# Patient Record
Sex: Male | Born: 1945
Health system: Southern US, Community
[De-identification: ages and names within clinical notes are randomized; demographics above are authoritative.]

## PROBLEM LIST (undated history)

## (undated) NOTE — *Deleted (*Deleted)
PMR Admission Coordinator Pre-Admission Assessment  Patient: Howard Allen is an 76 y.o., male MRN: [id number] DOB: 1945/09/12 Height: 5\' 10"  (177.8 cm) Weight: 61.1 kg              Insurance Information HMO:     PPO:     PCP:      IPA:      80/20: yes     OTHER:  PRIMARY: Medicare part A and B  Policy#:  [id number]     Subscriber: Pt.  CM Name:       Phone#:      Fax#:  Pre-Cert#: verified Health and safety inspector:  Benefits:  Phone #:      Name:  Eff. Date: 12/09/2002 A and B     Deduct: $1484      Out of Pocket Max: n/a      Life Max: n/a CIR: 100%      SNF: 20 full days Outpatient: 80%     Co-Pay: 20% Home Health: 100%      Co-Pay:  DME: 80%     Co-Pay: 20% Providers: pt choice       SECONDARY: Tricare for Life     Policy#: [id number]  The "Data Collection Information Summary" for patients in Inpatient Rehabilitation Facilities with attached "Privacy Act Statement-Health Care Records" was provided and verbally reviewed with: Patient and Family  Emergency Contact Information Contact Information    Name Relation Home Work [place] Daughter [phone]  [phone]   Albin, Duckett   [phone]     Current Medical History  Patient Admitting Diagnosis: CVA History of Present Illness: Howard Allen is a 70 y.o. right-handed male with history of Crohn's disease hypertension hyperlipidemia and depression.  Recent hospitalization for pneumatosis status post ileocolic resection.  Per chart review patient lives alone reportedly independent driving prior to admission.  1 level apartment.  Presented 02/13/2020 with tonic-clonic seizure and altered mental status.  Patient did receive Midazolam.  Seizures resolved and became apneic requiring intubation.  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  EEG negative for seizure.  Echocardiogram with ejection fraction of 55 to 60% no wall motion abnormalities grade 1  diastolic dysfunction.  Admission chemistries potassium 2.8, glucose 333, creatinine 1.91, WBC 17,300, CK 128, lactic acid 2.2, blood cultures no growth to date.  Neurology follow-up patient loaded with Keppra.  Plavix has been initiated for CVA prophylaxis.  Subcutaneous heparin for DVT prophylaxis.  An LP was performed on 02/14/2020 consistent with meningitis cultures no growth to date currently maintained on Rocephin and awaiting duration of antibiotic care and currently on droplet precautions.  Dysphagia #2 honey thick liquid diet.  Therapy evaluations completed with recommendations of physical medicine rehab consult. Complete NIHSS TOTAL: 14 Glasgow Coma Scale Score: 14  Past Medical History  No past medical history on file.  Family History  family history is not on file.  Prior Rehab/Hospitalizations:  Has the patient had prior rehab or hospitalizations prior to admission? Yes  Has the patient had major surgery during 100 days prior to admission? No  Current Medications   Current Facility-Administered Medications:  .  0.9 %  sodium chloride infusion, 250 mL, Intravenous, Continuous, Gleason, Darcella Gasman, PA-C, Stopped at 02/18/20 1022 .  acyclovir (ZOVIRAX) 690 mg in dextrose 5 % 100 mL IVPB, 10 mg/kg, Intravenous, Q12H, Verdene Lennert, MD, Last Rate: 113.8 mL/hr at 02/19/20 1343, 690 mg at  02/19/20 1343 .  albuterol (PROVENTIL) (2.5 MG/3ML) 0.083% nebulizer solution 2.5 mg, 2.5 mg, Nebulization, Q4H PRN, Verdene Lennert, MD .  Melene Muller ON 02/20/2020] amLODipine (NORVASC) tablet 5 mg, 5 mg, Oral, Daily, Lodema Hong A, RPH .  [START ON 02/20/2020] aspirin chewable tablet 81 mg, 81 mg, Oral, Daily, Lodema Hong A, RPH .  [START ON 02/20/2020] atorvastatin (LIPITOR) tablet 80 mg, 80 mg, Oral, Daily, Lodema Hong A, RPH .  cefTRIAXone (ROCEPHIN) 2 g in sodium chloride 0.9 % 100 mL IVPB, 2 g, Intravenous, Q12H, Verdene Lennert, MD, Last Rate: 200 mL/hr at 02/19/20 1235, 2 g at 02/19/20 1235  .  Chlorhexidine Gluconate Cloth 2 % PADS 6 each, 6 each, Topical, Daily, Gleason, Darcella Gasman, PA-C, 6 each at 02/19/20 1227 .  [START ON 02/20/2020] clopidogrel (PLAVIX) tablet 75 mg, 75 mg, Oral, Daily, Lodema Hong A, RPH .  docusate (COLACE) 50 MG/5ML liquid 100 mg, 100 mg, Oral, BID PRN, Lodema Hong A, RPH .  docusate (COLACE) 50 MG/5ML liquid 100 mg, 100 mg, Oral, BID, Lodema Hong A, RPH .  fentaNYL (SUBLIMAZE) injection 25 mcg, 25 mcg, Intravenous, Q15 min PRN, Gleason, Darcella Gasman, PA-C .  fentaNYL (SUBLIMAZE) injection 25-100 mcg, 25-100 mcg, Intravenous, Q30 min PRN, Gleason, Darcella Gasman, PA-C .  heparin injection 5,000 Units, 5,000 Units, Subcutaneous, Q8H, Gleason, Darcella Gasman, PA-C, 5,000 Units at 02/19/20 1346 .  insulin aspart (novoLOG) injection 0-9 Units, 0-9 Units, Subcutaneous, Q4H, Gleason, Darcella Gasman, PA-C, 1 Units at 02/17/20 2047 .  labetalol (NORMODYNE) injection 5 mg, 5 mg, Intravenous, Q2H PRN, Verdene Lennert, MD .  levETIRAcetam (KEPPRA) IVPB 1000 mg/100 mL premix, 1,000 mg, Intravenous, Q12H, Caryl Pina, MD, Last Rate: 400 mL/hr at 02/19/20 0353, 1,000 mg at 02/19/20 0353 .  multivitamin with minerals tablet 1 tablet, 1 tablet, Oral, Daily, Briant Sites, DO, 1 tablet at 02/19/20 1157 .  pantoprazole (PROTONIX) injection 40 mg, 40 mg, Intravenous, Daily, Gleason, Darcella Gasman, PA-C, 40 mg at 02/19/20 1209 .  [START ON 02/20/2020] polyethylene glycol (MIRALAX / GLYCOLAX) packet 17 g, 17 g, Oral, Daily, Lodema Hong A, RPH .  potassium chloride SA (KLOR-CON) CR tablet 40 mEq, 40 mEq, Oral, Daily, Lodema Hong A, RPH, 40 mEq at 02/19/20 1505 .  vancomycin (VANCOREADY) IVPB 750 mg/150 mL, 750 mg, Intravenous, Q12H, Silvana Newness, [facility], [facility], [facility], Last Rate: 150 mL/hr at 02/19/20 0436, 750 mg at 02/19/20 0436 .  [START ON 02/20/2020] vitamin B-12 (CYANOCOBALAMIN) tablet 1,000 mcg, 1,000 mcg, Oral, Daily, Pierce, Dwayne A, RPH  Patients Current Diet:  Diet Order            DIET DYS 2  Room service appropriate? Yes with Assist; Fluid consistency: Honey Thick  Diet effective now                 Precautions / Restrictions Precautions Precautions: Fall, Other (comment) (seizures; posey wrist restraints, hand mitts) Precaution Comments: rectal tube, condom cath Restrictions Weight Bearing Restrictions: No   Has the patient had 2 or more falls or a fall with injury in the past year?Yes  Prior Activity Level Limited Community (1-2x/wk): Pt. went out with daughter for errands  Prior Functional Level Prior Function Level of Independence:  (mod I) Comments: living alone but struggling at home per son.   Self Care: Did the patient need help bathing, dressing, using the toilet or eating?  Independent  Indoor Mobility: Did the patient need assistance with walking from room to room (with or without device)?  Independent  Stairs: Did the patient need assistance with internal or external stairs (with or without device)? Independent  Functional Cognition: Did the patient need help planning regular tasks such as shopping or remembering to take medications? Needed some help  Home Assistive Devices / Equipment Home Equipment: Hospital bed, Grab bars - tub/shower  Prior Device Use: Indicate devices/aids used by the patient prior to current illness, exacerbation or injury? Walker  Current Functional Level Cognition  Overall Cognitive Status: Impaired/Different from baseline Current Attention Level: Sustained Orientation Level: Disoriented to time, Oriented to person, Disoriented to situation Following Commands: Follows one step commands inconsistently, Follows one step commands with increased time Safety/Judgement: Decreased awareness of safety, Decreased awareness of deficits General Comments: some agitation noted towards end of session. Wanting to get out of bed "I need to go." Able to redirect. States he needs to call his wife. Son reports wife died 3 years ago.      Extremity Assessment (includes Sensation/Coordination)  Upper Extremity Assessment: Generalized weakness, Difficult to assess due to impaired cognition  Lower Extremity Assessment: Defer to PT evaluation RLE Deficits / Details: MMT scores of 3+ to 4- grossly RLE Sensation: WNL (denies N/T) RLE Coordination: decreased fine motor, decreased gross motor LLE Deficits / Details: MMT scores of 3+ to 4- grossly LLE Sensation: WNL (denies N/T) LLE Coordination: decreased fine motor, decreased gross motor    ADLs  Overall ADL's : Needs assistance/impaired Eating/Feeding: Moderate assistance, Sitting Eating/Feeding Details (indicate cue type and reason): 2/2 cognition Grooming: Maximal assistance Grooming Details (indicate cue type and reason): placed comb to head but difficulty sequencing movements to comb Upper Body Bathing: Maximal assistance, Sitting Lower Body Bathing: Maximal assistance, Sit to/from stand Upper Body Dressing : Maximal assistance, Sitting Lower Body Dressing: Maximal assistance, Sit to/from stand General ADL Comments: Sat EOB a few mintues then stood with mod A. Assist 2/2 generalized weakness, decreased activity tolerance, and impaired cognition    Mobility  Overal bed mobility: Needs Assistance Bed Mobility: Supine to Sit, Sit to Supine Supine to sit: Mod assist Sit to supine: Min guard General bed mobility comments: assist to powerup trunk and pivot hips to full EOB position. Sequencing cues.     Transfers  Overall transfer level: Needs assistance Equipment used: Rolling walker (2 wheeled) Transfers: Sit to/from Stand Sit to Stand: Mod assist General transfer comment: cues for safety. rw utilized. mod A to powerup steady adn control descent    Ambulation / Gait / Stairs / Wheelchair Mobility  Ambulation/Gait Ambulation/Gait assistance: Mod assist Gait Distance (Feet): 20 Feet Assistive device: Rolling walker (2 wheeled) Gait Pattern/deviations: Decreased step  length - right, Decreased step length - left, Decreased stride length, Shuffle, Scissoring, Staggering left, Staggering right, Narrow base of support General Gait Details: Ambulated around end of bed from L side to R of bed with RW at decreased speed. Pt exhibited narrow BOS and dec B step length with some scissoring, resulting in him stepping on his own feet with each step despite cues to correct. Pt unsteady and loses balance L and R, modA for safety and balance., Gait velocity: decreased Gait velocity interpretation: <1.31 ft/sec, indicative of household ambulator    Posture / Balance Dynamic Sitting Balance Sitting balance - Comments: Pt sits statically EOB majority of time with B UE support and no LOB, but occasionally leans posteriorly and requires modA to recover. Balance Overall balance assessment: History of Falls, Needs assistance Sitting-balance support: Bilateral upper extremity supported, Feet supported Sitting balance-Leahy Scale:  Poor Sitting balance - Comments: Pt sits statically EOB majority of time with B UE support and no LOB, but occasionally leans posteriorly and requires modA to recover. Postural control: Posterior lean Standing balance support: Bilateral upper extremity supported, During functional activity Standing balance-Leahy Scale: Poor Standing balance comment: Unsteadiness noted at knees and mod trunk sway, requlting in him requiring modA to maintain balance with B UE support on RW.    Special needs/care consideration Skin ***, Diabetic management *** and Special service needs ***     Previous Home Environment (from acute therapy documentation) Living Arrangements: Alone Available Help at Discharge: Family, Available PRN/intermittently Type of Home: Apartment Home Layout: One level Home Access: Level entry Bathroom Shower/Tub: Engineer, manufacturing systems: Standard Bathroom Accessibility: Yes Additional Comments: Son present and able to provide PLOF  data. Son reports pt was living alone but struggling with ADLs, family stopping by to check on him "all the time." Son reports plans were being made to move pt into ALF when he took a turn for the worse.  Discharge Living Setting Plans for Discharge Living Setting: Patient's home Type of Home at Discharge: House Discharge Home Layout: One level Discharge Home Access: Level entry Discharge Bathroom Shower/Tub: Walk-in shower Discharge Bathroom Toilet: Standard Discharge Bathroom Accessibility: Yes How Accessible: Accessible via walker Does the patient have any problems obtaining your medications?: Yes (Describe)  Social/Family/Support Systems Patient Roles: Other (Comment) Contact Information: [phone] Anticipated Caregiver: Lianne Moris Anticipated Caregiver's Contact Information: [phone] Ability/Limitations of Caregiver: none Caregiver Availability: Intermittent Discharge Plan Discussed with Primary Caregiver: Yes Is Caregiver In Agreement with Plan?: Yes   Goals Patient/Family Goal for Rehab: PT/OT/SLP Min A Expected length of stay: 14-17 days Pt/Family Agrees to Admission and willing to participate: Yes Program Orientation Provided & Reviewed with Pt/Caregiver Including Roles  & Responsibilities: Yes   Decrease burden of Care through IP rehab admission: Specialzed equipment needs, Diet advancement, Decrease number of caregivers, Bowel and bladder program and Patient/family education   Possible need for SNF placement upon discharge:Not anticipated   Patient Condition: {PATIENT'S CONDITION:22832}  Preadmission Screen Completed By:  Jeronimo Greaves, CCC-SLP, 02/19/2020 4:17 PM ______________________________________________________________________   Discussed status with Dr. Marland Kitchenon***at *** and received approval for admission today.  Admission Coordinator:  Jeronimo Greaves, time***/Date***

## (undated) NOTE — *Deleted (*Deleted)
Pharmacy Antibiotic Note  Howard Allen is a 46 y.o. male admitted on 02/13/2020 with seizures and now with concern for meningitis. Pharmacy has been consulted for Vancomycin dosing along with Acyclovir + Ampicillin + Rocephin per MD.   A Vancomycin trough this afternoon is ***therapeutic (VT ***, goal of 15-20 mcg/ml). The patient is noted to have resolving AKI (baseline unk) - SCr down to 1.18, CrCl~40-50 ml/min.   Plan: ***   Height: 5\' 10"  (177.8 cm) Weight: 59.5 kg (131 lb 2.8 oz) IBW/kg (Calculated) : 73  Temp (24hrs), Avg:98 F (36.7 C), Min:97.7 F (36.5 C), Max:98.3 F (36.8 C)  Recent Labs  Lab 02/13/20 1617 02/13/20 1617 02/13/20 1926 02/13/20 1930 02/13/20 1935 02/14/20 0418 02/15/20 0322 02/16/20 0729 02/17/20 0329  WBC 17.3*  --   --   --   --  9.0 6.2 9.4 7.6  CREATININE 1.91*   < >  --  1.71*  --  1.44* 1.19 1.23 1.18  LATICACIDVEN  --   --  1.9  --  2.2*  --   --   --   --    < > = values in this interval not displayed.    Estimated Creatinine Clearance: 47.6 mL/min (by C-G formula based on SCr of 1.18 mg/dL).    Allergies  Allergen Reactions  . Lisinopril Anaphylaxis, Cough and Other (See Comments)    Other reaction(s): anaphylaxis Pt states it made him "cough his head off"   . Lorazepam     Other reaction(s): Hallucinations    Antimicrobials this admission: Vanc 10/7 >> Ampicillin 10/7 >> Acyclovir 10/7 >> Rocephin 10/7 >>  Dose adjustments this admission: n/a  Microbiology results: 10/6 Fluvid >> neg 10/7 MRSA PCR >> positive 10/7 CSF cx >> [id number]  Thank you for allowing pharmacy to be a part of this patient's care.  Georgina Pillion, PharmD, BCPS Clinical Pharmacist Clinical phone for 02/17/2020: [phone] 02/17/2020 1:42 PM   **Pharmacist phone directory can now be found on [email or web address] ([email or web address]).  Listed under [facility].

## (undated) NOTE — *Deleted (*Deleted)
Physical Medicine and Rehabilitation Admission H&P    Chief Complaint  Patient presents with  . Seizures  : HPI: Howard Allen is a 70 year old right-handed male with history of Crohn's disease, hypertension, hyperlipidemia and depression.  Recent hospitalization for pneumatosis status post ileocolic resection.  Per chart review lives alone reportedly independent prior to admission and driving 1 level apartment.  Patient has a son in the area and a daughter in [place].  Presented 02/13/2020 with tonic-clonic seizure altered mental status.  Patient did receive Midazolam.  Seizure resolved became apneic requiring intubation.  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  EEG negative for seizure.  Echocardiogram with ejection fraction of 55 to 60% no wall motion abnormalities grade 1 diastolic dysfunction.  Admission chemistries potassium 2.8 glucose 333 creatinine 1.91 WBC 17,300 CK 128 lactic acid 2.2 blood cultures no growth to date.  Neurology follow-up loaded with Keppra.  Plavix and aspirin initiated for CVA prophylaxis.  Subcutaneous Lovenox for DVT prophylaxis.  An LP was performed on 02/14/2020 consistent with meningitis cultures no growth currently maintained on Rocephin/acyclovir as well as vancomycin awaiting duration of antibiotic care currently with droplet precautions.  PCR still pending.  Dysphagia #2 honey thick liquids.  Therapy evaluations completed patient was admitted for a comprehensive rehab program.  Review of Systems  Constitutional: Negative for chills and fever.  HENT: Negative for hearing loss.   Eyes: Negative for blurred vision and double vision.  Respiratory: Negative for cough and shortness of breath.   Cardiovascular: Negative for chest pain, palpitations and leg swelling.  Gastrointestinal: Positive for constipation and nausea. Negative for heartburn and vomiting.  Genitourinary: Positive for urgency.   Musculoskeletal: Positive for myalgias.  Skin: Negative for rash.  Neurological: Positive for seizures.  Psychiatric/Behavioral: Positive for depression. The patient has insomnia.   All other systems reviewed and are negative.  No past medical history on file. *** The histories are not reviewed yet. Please review them in the "History" navigator section and refresh this SmartLink. No family history on file. Social History:  has no history on file for tobacco use, alcohol use, and drug use. Allergies:  Allergies  Allergen Reactions  . Lisinopril Anaphylaxis, Cough and Other (See Comments)    Other reaction(s): anaphylaxis Pt states it made him "cough his head off"   . Lorazepam     Other reaction(s): Hallucinations   Medications Prior to Admission  Medication Sig Dispense Refill  . albuterol (VENTOLIN HFA) 108 (90 Base) MCG/ACT inhaler Inhale 2 puffs into the lungs every 6 (six) hours as needed for wheezing or shortness of breath.     Marland Kitchen amLODipine (NORVASC) 2.5 MG tablet Take 2.5 mg by mouth daily.    Marland Kitchen atorvastatin (LIPITOR) 20 MG tablet Take 20 mg by mouth daily.     . busPIRone (BUSPAR) 15 MG tablet Take 15 mg by mouth 2 (two) times daily.    . cyproheptadine (PERIACTIN) 4 MG tablet Take 4 mg by mouth 2 (two) times daily.    . fenofibrate 160 MG tablet Take 160 mg by mouth daily.    . metoprolol succinate (TOPROL-XL) 100 MG 24 hr tablet Take 100 mg by mouth daily.    . pantoprazole (PROTONIX) 20 MG tablet Take 20 mg by mouth daily.    . budesonide (ENTOCORT EC) 3 MG 24 hr capsule Take 3 mg by mouth daily.    Marland Kitchen doxazosin (CARDURA) 1 MG tablet Take 1 mg  by mouth daily.    . hydrochlorothiazide (HYDRODIURIL) 25 MG tablet Take 25 mg by mouth daily.    . meclizine (ANTIVERT) 25 MG tablet Take 25 mg by mouth 2 (two) times daily as needed for dizziness.       Drug Regimen Review Drug regimen was reviewed and remains appropriate with no significant issues identified.  Home: Home  Living Family/patient expects to be discharged to:: Private residence Living Arrangements: Alone Available Help at Discharge: Family, Available PRN/intermittently Type of Home: Apartment Home Access: Level entry Home Layout: One level Bathroom Shower/Tub: Engineer, manufacturing systems: Standard Bathroom Accessibility: Yes Home Equipment: Hospital bed, Grab bars - tub/shower Additional Comments: Son present and able to provide PLOF data. Son reports pt was living alone but struggling with ADLs, family stopping by to check on him "all the time." Son reports plans were being made to move pt into ALF when he took a turn for the worse.   Functional History: Prior Function Level of Independence:  (mod I) Comments: living alone but struggling at home per son.   Functional Status:  Mobility: Bed Mobility Overal bed mobility: Needs Assistance Bed Mobility: Supine to Sit, Sit to Supine Supine to sit: Mod assist Sit to supine: Min guard General bed mobility comments: assist to powerup trunk and pivot hips to full EOB position. Sequencing cues.  Transfers Overall transfer level: Needs assistance Equipment used: Rolling walker (2 wheeled) Transfers: Sit to/from Stand Sit to Stand: Mod assist General transfer comment: cues for safety. rw utilized. mod A to powerup steady adn control descent Ambulation/Gait Ambulation/Gait assistance: Mod assist Gait Distance (Feet): 20 Feet Assistive device: Rolling walker (2 wheeled) Gait Pattern/deviations: Decreased step length - right, Decreased step length - left, Decreased stride length, Shuffle, Scissoring, Staggering left, Staggering right, Narrow base of support General Gait Details: Ambulated around end of bed from L side to R of bed with RW at decreased speed. Pt exhibited narrow BOS and dec B step length with some scissoring, resulting in him stepping on his own feet with each step despite cues to correct. Pt unsteady and loses balance L and R,  modA for safety and balance., Gait velocity: decreased Gait velocity interpretation: <1.31 ft/sec, indicative of household ambulator    ADL: ADL Overall ADL's : Needs assistance/impaired Eating/Feeding: Moderate assistance, Sitting Eating/Feeding Details (indicate cue type and reason): 2/2 cognition Grooming: Maximal assistance Grooming Details (indicate cue type and reason): placed comb to head but difficulty sequencing movements to comb Upper Body Bathing: Maximal assistance, Sitting Lower Body Bathing: Maximal assistance, Sit to/from stand Upper Body Dressing : Maximal assistance, Sitting Lower Body Dressing: Maximal assistance, Sit to/from stand General ADL Comments: Sat EOB a few mintues then stood with mod A. Assist 2/2 generalized weakness, decreased activity tolerance, and impaired cognition  Cognition: Cognition Overall Cognitive Status: Impaired/Different from baseline Orientation Level: Oriented to person, Oriented to place, Disoriented to place Cognition Arousal/Alertness: Awake/alert Behavior During Therapy: Restless, Impulsive Overall Cognitive Status: Impaired/Different from baseline Area of Impairment: Orientation, Attention, Memory, Following commands, Safety/judgement, Awareness, Problem solving Orientation Level: Disoriented to, Place, Time, Situation Current Attention Level: Sustained Memory: Decreased short-term memory, Decreased recall of precautions Following Commands: Follows one step commands inconsistently, Follows one step commands with increased time Safety/Judgement: Decreased awareness of safety, Decreased awareness of deficits Awareness: Intellectual Problem Solving: Slow processing, Decreased initiation, Difficulty sequencing, Requires verbal cues, Requires tactile cues General Comments: some agitation noted towards end of session. Wanting to get out of bed "I need  to go." Able to redirect. States he needs to call his wife. Son reports wife died 3  years ago.   Physical Exam: Blood pressure (!) 160/94, pulse 80, temperature 98.3 F (36.8 C), temperature source Axillary, resp. rate 17, height 5\' 10"  (1.778 m), weight 60.8 kg, SpO2 100 %. Physical Exam Neurological:     Comments: Patient is lethargic but arousable.  Provides his name but was not able to give place or time.  Follows simple commands.  Limited medical historian.  Mood is flat but appropriate     Results for orders placed or performed during the hospital encounter of 02/13/20 (from the past 48 hour(s))  Glucose, capillary     Status: None   Collection Time: 02/18/20  8:16 AM  Result Value Ref Range   Glucose-Capillary 95 70 - 99 mg/dL    Comment: Glucose reference range applies only to samples taken after fasting for at least 8 hours.  Glucose, capillary     Status: Abnormal   Collection Time: 02/18/20 12:07 PM  Result Value Ref Range   Glucose-Capillary 117 (H) 70 - 99 mg/dL    Comment: Glucose reference range applies only to samples taken after fasting for at least 8 hours.  Glucose, capillary     Status: Abnormal   Collection Time: 02/18/20  4:29 PM  Result Value Ref Range   Glucose-Capillary 109 (H) 70 - 99 mg/dL    Comment: Glucose reference range applies only to samples taken after fasting for at least 8 hours.   Comment 1 Notify RN    Comment 2 Document in Chart   Glucose, capillary     Status: None   Collection Time: 02/18/20  8:23 PM  Result Value Ref Range   Glucose-Capillary 93 70 - 99 mg/dL    Comment: Glucose reference range applies only to samples taken after fasting for at least 8 hours.  Glucose, capillary     Status: None   Collection Time: 02/18/20 11:27 PM  Result Value Ref Range   Glucose-Capillary 82 70 - 99 mg/dL    Comment: Glucose reference range applies only to samples taken after fasting for at least 8 hours.  Glucose, capillary     Status: Abnormal   Collection Time: 02/19/20  3:55 AM  Result Value Ref Range   Glucose-Capillary 103  (H) 70 - 99 mg/dL    Comment: Glucose reference range applies only to samples taken after fasting for at least 8 hours.  Basic metabolic panel     Status: Abnormal   Collection Time: 02/19/20  6:41 AM  Result Value Ref Range   Sodium 135 135 - 145 mmol/L   Potassium 3.8 3.5 - 5.1 mmol/L   Chloride 96 (L) 98 - 111 mmol/L   CO2 24 22 - 32 mmol/L   Glucose, Bld 93 70 - 99 mg/dL    Comment: Glucose reference range applies only to samples taken after fasting for at least 8 hours.   BUN 18 8 - 23 mg/dL   Creatinine, Ser 1.61 (H) 0.61 - 1.24 mg/dL   Calcium 9.4 8.9 - 09.6 mg/dL   GFR, Estimated 56 (L) >60 mL/min   Anion gap 15 5 - 15    Comment: Performed at [facility], [address], [place], [place] [address]  Magnesium     Status: Abnormal   Collection Time: 02/19/20  6:41 AM  Result Value Ref Range   Magnesium 1.6 (L) 1.7 - 2.4 mg/dL  Comment: Performed at [facility], [address] [place], [place] [address]  Phosphorus     Status: Abnormal   Collection Time: 02/19/20  6:41 AM  Result Value Ref Range   Phosphorus 1.8 (L) 2.5 - 4.6 mg/dL    Comment: Performed at [facility], [address] [place], [place] [address]  Glucose, capillary     Status: None   Collection Time: 02/19/20  7:52 AM  Result Value Ref Range   Glucose-Capillary 81 70 - 99 mg/dL    Comment: Glucose reference range applies only to samples taken after fasting for at least 8 hours.  Glucose, capillary     Status: Abnormal   Collection Time: 02/19/20 11:38 AM  Result Value Ref Range   Glucose-Capillary 67 (L) 70 - 99 mg/dL    Comment: Glucose reference range applies only to samples taken after fasting for at least 8 hours.  Glucose, capillary     Status: Abnormal   Collection Time: 02/19/20 12:06 PM  Result Value Ref Range   Glucose-Capillary 61 (L) 70 - 99 mg/dL    Comment: Glucose reference range applies only to samples taken after fasting for at least 8 hours.  Glucose, capillary      Status: Abnormal   Collection Time: 02/19/20 12:30 PM  Result Value Ref Range   Glucose-Capillary 119 (H) 70 - 99 mg/dL    Comment: Glucose reference range applies only to samples taken after fasting for at least 8 hours.  Glucose, capillary     Status: None   Collection Time: 02/19/20  3:59 PM  Result Value Ref Range   Glucose-Capillary 95 70 - 99 mg/dL    Comment: Glucose reference range applies only to samples taken after fasting for at least 8 hours.  Vancomycin, trough     Status: Abnormal   Collection Time: 02/19/20  4:45 PM  Result Value Ref Range   Vancomycin Tr 26 (HH) 15 - 20 ug/mL    Comment: CRITICAL RESULT CALLED TO, READ BACK BY AND VERIFIED WITH: O.GRAY RN [id number] 02/19/20 MCCORMICK K Performed at [facility], [address], [place], [place] [address]   Creatinine, serum     Status: Abnormal   Collection Time: 02/19/20  6:13 PM  Result Value Ref Range   Creatinine, Ser 1.29 (H) 0.61 - 1.24 mg/dL   GFR, Estimated 55 (L) >60 mL/min    Comment: Performed at [facility], [address], [place], [place] [address]  Glucose, capillary     Status: Abnormal   Collection Time: 02/19/20  8:16 PM  Result Value Ref Range   Glucose-Capillary 122 (H) 70 - 99 mg/dL    Comment: Glucose reference range applies only to samples taken after fasting for at least 8 hours.  Glucose, capillary     Status: Abnormal   Collection Time: 02/19/20 11:35 PM  Result Value Ref Range   Glucose-Capillary 129 (H) 70 - 99 mg/dL    Comment: Glucose reference range applies only to samples taken after fasting for at least 8 hours.  Glucose, capillary     Status: Abnormal   Collection Time: 02/20/20  3:46 AM  Result Value Ref Range   Glucose-Capillary 121 (H) 70 - 99 mg/dL    Comment: Glucose reference range applies only to samples taken after fasting for at least 8 hours.  Magnesium     Status: None   Collection Time: 02/20/20  4:49 AM  Result Value Ref Range  Magnesium 2.0 1.7 - 2.4  mg/dL    Comment: Performed at [facility], [address] [place], [place] [address]  Phosphorus     Status: None   Collection Time: 02/20/20  4:49 AM  Result Value Ref Range   Phosphorus 3.0 2.5 - 4.6 mg/dL    Comment: Performed at [facility], [address] [place], [place] [address]  Vancomycin, random     Status: None   Collection Time: 02/20/20  4:49 AM  Result Value Ref Range   Vancomycin Rm 16     Comment:        Random Vancomycin therapeutic range is dependent on dosage and time of specimen collection. A peak range is 20.0-40.0 ug/mL A trough range is 5.0-15.0 ug/mL        Performed at [facility], [address], [place], [place] [address]   Basic metabolic panel     Status: Abnormal   Collection Time: 02/20/20  4:49 AM  Result Value Ref Range   Sodium 134 (L) 135 - 145 mmol/L   Potassium 3.4 (L) 3.5 - 5.1 mmol/L   Chloride 99 98 - 111 mmol/L   CO2 24 22 - 32 mmol/L   Glucose, Bld 97 70 - 99 mg/dL    Comment: Glucose reference range applies only to samples taken after fasting for at least 8 hours.   BUN 15 8 - 23 mg/dL   Creatinine, Ser 0.10 0.61 - 1.24 mg/dL   Calcium 8.8 (L) 8.9 - 10.3 mg/dL   GFR, Estimated >27 >25 mL/min   Anion gap 11 5 - 15    Comment: Performed at [facility], [address], [place], [place] [address]   CT ANGIO HEAD W OR WO CONTRAST  Result Date: 02/18/2020 CLINICAL DATA:  Recent seizures and meningitis with left thalamic and left hippocampal signal abnormality on MRI. EXAM: CT ANGIOGRAPHY HEAD AND NECK TECHNIQUE: Multidetector CT imaging of the head and neck was performed using the standard protocol during bolus administration of intravenous contrast. Multiplanar CT image reconstructions and MIPs were obtained to evaluate the vascular anatomy. Carotid stenosis measurements (when applicable) are obtained utilizing NASCET criteria, using the distal internal carotid diameter as the denominator. CONTRAST:  75mL  OMNIPAQUE IOHEXOL 350 MG/ML SOLN COMPARISON:  Head MRI 02/14/2020 FINDINGS: CT HEAD FINDINGS Brain: A 2.5 cm region of hypoattenuation at the ventral aspect of the left thalamus corresponds to cytotoxic edema on MRI. No new infarct, intracranial hemorrhage, midline shift, or extra-axial fluid collection is identified. Hypodensities in the cerebral white matter bilaterally are nonspecific but compatible with mild chronic small vessel ischemic disease. Vascular: No hyperdense vessel. Skull: No fracture or suspicious osseous lesion. Sinuses: Visualized paranasal sinuses and mastoid air cells are clear. Orbits: Bilateral cataract extraction. Review of the MIP images confirms the above findings CTA NECK FINDINGS Aortic arch: Normal variant aortic arch branching pattern with common origin of the brachiocephalic and left common carotid arteries. Mild atherosclerosis without arch vessel origin stenosis. Right carotid system: Patent with minimal scattered soft plaque in the common carotid and proximal internal carotid artery. No evidence of dissection or stenosis. Left carotid system: Patent with minimal calcified and soft plaque at the carotid bifurcation. No evidence of dissection or stenosis. Vertebral arteries: Patent without evidence of dissection or stenosis. Mildly dominant right vertebral artery. Skeleton: Moderate lower cervical disc degeneration. Asymmetric right facet arthrosis at C7-T1. Other neck: No evidence of cervical lymphadenopathy or mass. Upper  chest: Mild centrilobular emphysema and biapical pleuroparenchymal scarring. Calcified granulomas in the left upper lobe. Multiple small nodules in the right upper lobe measuring up to 5 mm. Review of the MIP images confirms the above findings CTA HEAD FINDINGS Anterior circulation: The internal carotid arteries are widely patent from skull base to carotid termini. 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 ACAs and MCAs are patent with moderate right and  mild left distal MCA and ACA branch vessel irregularity but no evidence of a proximal branch occlusion. There is no significant M1 or right A1 stenosis. There is mild irregular narrowing of the left A1 segment. Posterior circulation: The intracranial vertebral arteries are widely patent to the basilar. Patent right PICA, left AICA, and bilateral SCA origins are identified. The basilar artery is widely patent. Posterior communicating arteries are diminutive or absent. Both PCAs are patent without evidence of a significant proximal stenosis. No aneurysm is identified. Venous sinuses: Patent. Anatomic variants: None. Review of the MIP images confirms the above findings IMPRESSION: 1. 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 3. Widely patent cervical carotid and vertebral arteries. 4. Right upper lobe pulmonary nodules measuring up to 5 mm. Non-contrast chest CT can be considered in 12 months. This recommendation follows the consensus statement: Guidelines for Management of Incidental Pulmonary Nodules Detected on CT Images: From the Fleischner Society 2017; Radiology 2017; 284:228-243. 5. Aortic Atherosclerosis (ICD10-I70.0) and Emphysema (ICD10-J43.9). Electronically Signed   By: Sebastian Ache M.D.   On: 02/18/2020 11:53   CT ANGIO NECK W OR WO CONTRAST  Result Date: 02/18/2020 CLINICAL DATA:  Recent seizures and meningitis with left thalamic and left hippocampal signal abnormality on MRI. EXAM: CT ANGIOGRAPHY HEAD AND NECK TECHNIQUE: Multidetector CT imaging of the head and neck was performed using the standard protocol during bolus administration of intravenous contrast. Multiplanar CT image reconstructions and MIPs were obtained to evaluate the vascular anatomy. Carotid stenosis measurements (when applicable) are obtained utilizing NASCET criteria, using the distal internal carotid diameter as the denominator.  CONTRAST:  75mL OMNIPAQUE IOHEXOL 350 MG/ML SOLN COMPARISON:  Head MRI 02/14/2020 FINDINGS: CT HEAD FINDINGS Brain: A 2.5 cm region of hypoattenuation at the ventral aspect of the left thalamus corresponds to cytotoxic edema on MRI. No new infarct, intracranial hemorrhage, midline shift, or extra-axial fluid collection is identified. Hypodensities in the cerebral white matter bilaterally are nonspecific but compatible with mild chronic small vessel ischemic disease. Vascular: No hyperdense vessel. Skull: No fracture or suspicious osseous lesion. Sinuses: Visualized paranasal sinuses and mastoid air cells are clear. Orbits: Bilateral cataract extraction. Review of the MIP images confirms the above findings CTA NECK FINDINGS Aortic arch: Normal variant aortic arch branching pattern with common origin of the brachiocephalic and left common carotid arteries. Mild atherosclerosis without arch vessel origin stenosis. Right carotid system: Patent with minimal scattered soft plaque in the common carotid and proximal internal carotid artery. No evidence of dissection or stenosis. Left carotid system: Patent with minimal calcified and soft plaque at the carotid bifurcation. No evidence of dissection or stenosis. Vertebral arteries: Patent without evidence of dissection or stenosis. Mildly dominant right vertebral artery. Skeleton: Moderate lower cervical disc degeneration. Asymmetric right facet arthrosis at C7-T1. Other neck: No evidence of cervical lymphadenopathy or mass. Upper chest: Mild centrilobular emphysema and biapical pleuroparenchymal scarring. Calcified granulomas in the left upper lobe. Multiple small nodules in the right upper lobe measuring up to 5 mm. Review  of the MIP images confirms the above findings CTA HEAD FINDINGS Anterior circulation: The internal carotid arteries are widely patent from skull base to carotid termini. 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 ACAs and MCAs are patent with  moderate right and mild left distal MCA and ACA branch vessel irregularity but no evidence of a proximal branch occlusion. There is no significant M1 or right A1 stenosis. There is mild irregular narrowing of the left A1 segment. Posterior circulation: The intracranial vertebral arteries are widely patent to the basilar. Patent right PICA, left AICA, and bilateral SCA origins are identified. The basilar artery is widely patent. Posterior communicating arteries are diminutive or absent. Both PCAs are patent without evidence of a significant proximal stenosis. No aneurysm is identified. Venous sinuses: Patent. Anatomic variants: None. Review of the MIP images confirms the above findings IMPRESSION: 1. 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 3. Widely patent cervical carotid and vertebral arteries. 4. Right upper lobe pulmonary nodules measuring up to 5 mm. Non-contrast chest CT can be considered in 12 months. This recommendation follows the consensus statement: Guidelines for Management of Incidental Pulmonary Nodules Detected on CT Images: From the Fleischner Society 2017; Radiology 2017; 284:228-243. 5. Aortic Atherosclerosis (ICD10-I70.0) and Emphysema (ICD10-J43.9). Electronically Signed   By: Sebastian Ache M.D.   On: 02/18/2020 11:53   DG Swallowing Func-Speech Pathology  Result Date: 02/18/2020 Objective Swallowing Evaluation: Type of Study: MBS-Modified Barium Swallow Study  Patient Details Name: Laird Runnion MRN: [id number] Date of Birth: [birth year] Today's Date: 02/18/2020 Time: SLP Start Time (ACUTE ONLY): 1412 -SLP Stop Time (ACUTE ONLY): 1428 SLP Time Calculation (min) (ACUTE ONLY): 16 min Past Medical History: No past medical history on file. Past Surgical History: The histories are not reviewed yet. Please review them in the "History" navigator section and refresh this SmartLink. HPI: 58 y.o. 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   Subjective: pt alert, cooperative but confused Assessment / Plan / Recommendation CHL IP CLINICAL IMPRESSIONS 02/18/2020 Clinical Impression Pt has a mild oropharyngeal dysphagia suspected to be mostly secondary to recent intubation as well as altered mentation. He has mild anterior spillage with thin liquids and prolonged mastication with solids. When boluses reach his pharynx he has mildly reduced base of tongue retraction and hyolaryngeal elevation/excursion. Minimal residuals remain in his pharynx post-swallow, and he is often able to achieve sufficient laryngeal vestibule closure to protect his airway. However, he also has fluctuating timing, and when boluses reach his pyriform sinuses before the swallow and start to spill posteriorly into the laryngeal vestibule, he does not have the ability to squeeze them back out. Suspect reduced glottic sufficiency as thin and nectar thickl barium continues to progress onward past his vocal folds as he swallows. Aspiration is silent and cannot be fully cleared with a cued cough. Airway protection is much better with honey thick liquids and solids, which are better contained above his valleculae before the swallow. Recommend starting Dys 2 diet and honey thick liquids. Will f/u for potential to advance as mentation improves and with more time post-extubation, as length of intubation was pretty brief.  SLP Visit  Diagnosis Dysphagia, pharyngeal phase (R13.13) Attention and concentration deficit following -- Frontal lobe and executive function deficit following -- Impact on safety and function Moderate aspiration risk   [facility]L  IP TREATMENT RECOMMENDATION 02/18/2020 Treatment Recommendations Therapy as outlined in treatment plan below   Prognosis 02/18/2020 Prognosis for Safe Diet Advancement Good Barriers to Reach Goals Cognitive deficits Barriers/Prognosis Comment -- [facility] IP DIET RECOMMENDATION 02/18/2020 SLP Diet Recommendations Dysphagia 2 (Fine chop) solids;Honey thick liquids Liquid Administration via Cup;Straw Medication Administration Crushed with puree Compensations Minimize environmental distractions;Slow rate;Small sips/bites Postural Changes Seated upright at 90 degrees   CHL IP OTHER RECOMMENDATIONS 02/18/2020 Recommended Consults -- Oral Care Recommendations Oral care BID Other Recommendations --   [facility] IP FOLLOW UP RECOMMENDATIONS 02/18/2020 Follow up Recommendations (No Data)   [facility]L IP FREQUENCY AND DURATION 02/18/2020 Speech Therapy Frequency (ACUTE ONLY) min 2x/week Treatment Duration 2 weeks      CHL IP ORAL PHASE 02/18/2020 Oral Phase Impaired Oral - Pudding Teaspoon -- Oral - Pudding Cup -- Oral - Honey Teaspoon -- Oral - Honey Cup Reduced posterior propulsion Oral - Nectar Teaspoon -- Oral - Nectar Cup Reduced posterior propulsion Oral - Nectar Straw Reduced posterior propulsion Oral - Thin Teaspoon -- Oral - Thin Cup Left anterior bolus loss;Right anterior bolus loss;Reduced posterior propulsion Oral - Thin Straw Reduced posterior propulsion Oral - Puree Reduced posterior propulsion Oral - Mech Soft Reduced posterior propulsion;Delayed oral transit Oral - Regular -- Oral - Multi-Consistency -- Oral - Pill -- Oral Phase - Comment --  [facility]L IP PHARYNGEAL PHASE 02/18/2020 Pharyngeal Phase Impaired Pharyngeal- Pudding Teaspoon -- Pharyngeal -- Pharyngeal- Pudding Cup -- Pharyngeal -- Pharyngeal- Honey Teaspoon -- Pharyngeal -- Pharyngeal- Honey Cup Reduced anterior laryngeal mobility;Reduced laryngeal elevation;Reduced tongue base retraction;Pharyngeal residue - valleculae Pharyngeal -- Pharyngeal- Nectar Teaspoon --  Pharyngeal -- Pharyngeal- Nectar Cup Reduced anterior laryngeal mobility;Reduced laryngeal elevation;Reduced tongue base retraction;Pharyngeal residue - valleculae;Penetration/Aspiration before swallow Pharyngeal Material enters airway, passes BELOW cords without attempt by patient to eject out (silent aspiration) Pharyngeal- Nectar Straw Reduced anterior laryngeal mobility;Reduced laryngeal elevation;Reduced tongue base retraction;Pharyngeal residue - valleculae;Penetration/Aspiration before swallow Pharyngeal Material enters airway, passes BELOW cords without attempt by patient to eject out (silent aspiration) Pharyngeal- Thin Teaspoon -- Pharyngeal -- Pharyngeal- Thin Cup Reduced anterior laryngeal mobility;Reduced laryngeal elevation;Reduced tongue base retraction;Pharyngeal residue - valleculae;Penetration/Aspiration before swallow Pharyngeal Material enters airway, passes BELOW cords without attempt by patient to eject out (silent aspiration) Pharyngeal- Thin Straw Reduced anterior laryngeal mobility;Reduced laryngeal elevation;Reduced tongue base retraction;Pharyngeal residue - valleculae;Penetration/Aspiration before swallow Pharyngeal Material enters airway, passes BELOW cords without attempt by patient to eject out (silent aspiration) Pharyngeal- Puree Reduced anterior laryngeal mobility;Reduced laryngeal elevation;Reduced tongue base retraction;Pharyngeal residue - valleculae Pharyngeal -- Pharyngeal- Mechanical Soft Reduced anterior laryngeal mobility;Reduced laryngeal elevation;Reduced tongue base retraction;Pharyngeal residue - valleculae Pharyngeal -- Pharyngeal- Regular -- Pharyngeal -- Pharyngeal- Multi-consistency -- Pharyngeal -- Pharyngeal- Pill -- Pharyngeal -- Pharyngeal Comment --  CHL IP CERVICAL ESOPHAGEAL PHASE 02/18/2020 Cervical Esophageal Phase WFL Pudding Teaspoon -- Pudding Cup -- Honey Teaspoon -- Honey Cup -- Nectar Teaspoon -- Nectar Cup -- Nectar Straw -- Thin Teaspoon -- Thin Cup  -- Thin Straw -- Puree -- Mechanical Soft -- Regular -- Multi-consistency -- Pill -- Cervical Esophageal Comment -- Mahala Menghini., M.A. CCC-SLP Acute Rehabilitation Services Pager [phone] Office [phone] 02/18/2020, 3:48 PM                  Medical Problem List and Plan: 1.  Decreased functional mobility  secondary to acute left thalamic infarcts/ status epilepticus  -patient may *** shower  -ELOS/Goals: *** 2.  Antithrombotics: -DVT/anticoagulation: SCDs  -antiplatelet therapy: Aspirin 81 mg, Plavix 75 mg daily x3 weeks then aspirin alone 3. Pain Management: Tylenol as needed 4. Mood: Provide emotional support  -antipsychotic agents: N/A 5. Neuropsych: This patient is capable of making decisions on his own behalf. 6. Skin/Wound Care: Routine skin checks 7. Fluids/Electrolytes/Nutrition: Routine in and outs with follow-up chemistries 8.  Seizure disorder.  Keppra 1000 mg every 12 hours 9.  Meningitis.  LP 02/14/2020 consistent with meningitis.  Cultures no growth to date x3 days.  HSV with positive IgG.  PCR pending.  Patient currently remains on IV Rocephin/acyclovir and vancomycin.  Will need to determine duration of antibiotic care. 10.  Dysphagia.  Dysphagia #2 honey thick liquids.  Follow-up speech therapy 11.  Hypertension.  Norvasc 5 mg daily, Toprol 100 mg daily.  Monitor with increased mobility 12.  Hyperlipidemia.  Fenofibrate 160 mg daily/Lipitor 80 mg daily ***  SCHNEUR CROWSON, PA-C 02/20/2020

---

## 2020-02-13 ENCOUNTER — Inpatient Hospital Stay (HOSPITAL_COMMUNITY): Payer: Medicare Other

## 2020-02-13 ENCOUNTER — Inpatient Hospital Stay (HOSPITAL_COMMUNITY)
Admission: EM | Admit: 2020-02-13 | Discharge: 2020-03-17 | DRG: 097 | Disposition: A | Discharge status: Skilled Nursing Facility | Payer: Medicare Other | Attending: Internal Medicine | Admitting: Internal Medicine

## 2020-02-13 ENCOUNTER — Emergency Department (HOSPITAL_COMMUNITY): Payer: Medicare Other

## 2020-02-13 DIAGNOSIS — R634 Abnormal weight loss: Secondary | ICD-10-CM

## 2020-02-13 DIAGNOSIS — E785 Hyperlipidemia, unspecified: Secondary | ICD-10-CM | POA: Diagnosis present

## 2020-02-13 DIAGNOSIS — Z20822 Contact with and (suspected) exposure to covid-19: Secondary | ICD-10-CM | POA: Diagnosis present

## 2020-02-13 DIAGNOSIS — I639 Cerebral infarction, unspecified: Secondary | ICD-10-CM | POA: Diagnosis not present

## 2020-02-13 DIAGNOSIS — R61 Generalized hyperhidrosis: Secondary | ICD-10-CM | POA: Diagnosis present

## 2020-02-13 DIAGNOSIS — J9601 Acute respiratory failure with hypoxia: Secondary | ICD-10-CM | POA: Diagnosis present

## 2020-02-13 DIAGNOSIS — R569 Unspecified convulsions: Secondary | ICD-10-CM

## 2020-02-13 DIAGNOSIS — K5090 Crohn's disease, unspecified, without complications: Secondary | ICD-10-CM | POA: Diagnosis present

## 2020-02-13 DIAGNOSIS — G031 Chronic meningitis: Secondary | ICD-10-CM | POA: Diagnosis not present

## 2020-02-13 DIAGNOSIS — E872 Acidosis: Secondary | ICD-10-CM | POA: Diagnosis present

## 2020-02-13 DIAGNOSIS — E876 Hypokalemia: Secondary | ICD-10-CM

## 2020-02-13 DIAGNOSIS — G9349 Other encephalopathy: Secondary | ICD-10-CM | POA: Diagnosis present

## 2020-02-13 DIAGNOSIS — D849 Immunodeficiency, unspecified: Secondary | ICD-10-CM | POA: Diagnosis present

## 2020-02-13 DIAGNOSIS — I700 Atherosclerosis of aorta: Secondary | ICD-10-CM | POA: Diagnosis present

## 2020-02-13 DIAGNOSIS — E440 Moderate protein-calorie malnutrition: Secondary | ICD-10-CM | POA: Diagnosis present

## 2020-02-13 DIAGNOSIS — W19XXXA Unspecified fall, initial encounter: Secondary | ICD-10-CM | POA: Diagnosis present

## 2020-02-13 DIAGNOSIS — G40901 Epilepsy, unspecified, not intractable, with status epilepticus: Secondary | ICD-10-CM | POA: Diagnosis not present

## 2020-02-13 DIAGNOSIS — J9600 Acute respiratory failure, unspecified whether with hypoxia or hypercapnia: Secondary | ICD-10-CM | POA: Diagnosis not present

## 2020-02-13 DIAGNOSIS — N179 Acute kidney failure, unspecified: Secondary | ICD-10-CM | POA: Diagnosis present

## 2020-02-13 DIAGNOSIS — J439 Emphysema, unspecified: Secondary | ICD-10-CM | POA: Diagnosis present

## 2020-02-13 DIAGNOSIS — S01111A Laceration without foreign body of right eyelid and periocular area, initial encounter: Secondary | ICD-10-CM | POA: Diagnosis present

## 2020-02-13 DIAGNOSIS — R911 Solitary pulmonary nodule: Secondary | ICD-10-CM | POA: Diagnosis present

## 2020-02-13 DIAGNOSIS — Z888 Allergy status to other drugs, medicaments and biological substances status: Secondary | ICD-10-CM | POA: Diagnosis not present

## 2020-02-13 DIAGNOSIS — R296 Repeated falls: Secondary | ICD-10-CM | POA: Diagnosis present

## 2020-02-13 DIAGNOSIS — G0490 Encephalitis and encephalomyelitis, unspecified: Secondary | ICD-10-CM | POA: Diagnosis not present

## 2020-02-13 DIAGNOSIS — G039 Meningitis, unspecified: Secondary | ICD-10-CM | POA: Diagnosis not present

## 2020-02-13 DIAGNOSIS — Z79899 Other long term (current) drug therapy: Secondary | ICD-10-CM | POA: Diagnosis not present

## 2020-02-13 DIAGNOSIS — Z681 Body mass index (BMI) 19 or less, adult: Secondary | ICD-10-CM

## 2020-02-13 DIAGNOSIS — E781 Pure hyperglyceridemia: Secondary | ICD-10-CM | POA: Diagnosis present

## 2020-02-13 DIAGNOSIS — R739 Hyperglycemia, unspecified: Secondary | ICD-10-CM | POA: Diagnosis present

## 2020-02-13 DIAGNOSIS — I671 Cerebral aneurysm, nonruptured: Secondary | ICD-10-CM | POA: Diagnosis present

## 2020-02-13 DIAGNOSIS — E538 Deficiency of other specified B group vitamins: Secondary | ICD-10-CM | POA: Diagnosis present

## 2020-02-13 DIAGNOSIS — G030 Nonpyogenic meningitis: Secondary | ICD-10-CM

## 2020-02-13 DIAGNOSIS — I129 Hypertensive chronic kidney disease with stage 1 through stage 4 chronic kidney disease, or unspecified chronic kidney disease: Secondary | ICD-10-CM | POA: Diagnosis present

## 2020-02-13 DIAGNOSIS — C7949 Secondary malignant neoplasm of other parts of nervous system: Secondary | ICD-10-CM

## 2020-02-13 DIAGNOSIS — E162 Hypoglycemia, unspecified: Secondary | ICD-10-CM | POA: Diagnosis present

## 2020-02-13 DIAGNOSIS — I672 Cerebral atherosclerosis: Secondary | ICD-10-CM | POA: Diagnosis present

## 2020-02-13 DIAGNOSIS — Z9911 Dependence on respirator [ventilator] status: Secondary | ICD-10-CM

## 2020-02-13 DIAGNOSIS — R630 Anorexia: Secondary | ICD-10-CM | POA: Diagnosis present

## 2020-02-13 DIAGNOSIS — E860 Dehydration: Secondary | ICD-10-CM | POA: Diagnosis present

## 2020-02-13 DIAGNOSIS — I6381 Other cerebral infarction due to occlusion or stenosis of small artery: Secondary | ICD-10-CM | POA: Diagnosis present

## 2020-02-13 DIAGNOSIS — N182 Chronic kidney disease, stage 2 (mild): Secondary | ICD-10-CM | POA: Diagnosis present

## 2020-02-13 DIAGNOSIS — C801 Malignant (primary) neoplasm, unspecified: Secondary | ICD-10-CM

## 2020-02-13 DIAGNOSIS — E878 Other disorders of electrolyte and fluid balance, not elsewhere classified: Secondary | ICD-10-CM | POA: Diagnosis present

## 2020-02-13 DIAGNOSIS — D509 Iron deficiency anemia, unspecified: Secondary | ICD-10-CM | POA: Diagnosis present

## 2020-02-13 DIAGNOSIS — J45909 Unspecified asthma, uncomplicated: Secondary | ICD-10-CM | POA: Diagnosis present

## 2020-02-13 DIAGNOSIS — I959 Hypotension, unspecified: Secondary | ICD-10-CM | POA: Diagnosis present

## 2020-02-13 DIAGNOSIS — Z4659 Encounter for fitting and adjustment of other gastrointestinal appliance and device: Secondary | ICD-10-CM

## 2020-02-13 DIAGNOSIS — F32A Depression, unspecified: Secondary | ICD-10-CM | POA: Diagnosis present

## 2020-02-13 LAB — URINALYSIS, ROUTINE W REFLEX MICROSCOPIC
Bilirubin Urine: NEGATIVE
Glucose, UA: 150 mg/dL — AB
Hgb urine dipstick: NEGATIVE
Ketones, ur: NEGATIVE mg/dL
Leukocytes,Ua: NEGATIVE
Nitrite: NEGATIVE
Protein, ur: 100 mg/dL — AB
Specific Gravity, Urine: 1.012 (ref 1.005–1.030)
pH: 5 (ref 5.0–8.0)

## 2020-02-13 LAB — CBC WITH DIFFERENTIAL/PLATELET
Abs Immature Granulocytes: 0.31 10*3/uL — ABNORMAL HIGH (ref 0.00–0.07)
Basophils Absolute: 0 10*3/uL (ref 0.0–0.1)
Basophils Relative: 0 %
Eosinophils Absolute: 0 10*3/uL (ref 0.0–0.5)
Eosinophils Relative: 0 %
HCT: 36.3 % — ABNORMAL LOW (ref 39.0–52.0)
Hemoglobin: 10.9 g/dL — ABNORMAL LOW (ref 13.0–17.0)
Immature Granulocytes: 2 %
Lymphocytes Relative: 7 %
Lymphs Abs: 1.2 10*3/uL (ref 0.7–4.0)
MCH: 28.8 pg (ref 26.0–34.0)
MCHC: 30 g/dL (ref 30.0–36.0)
MCV: 95.8 fL (ref 80.0–100.0)
Monocytes Absolute: 1.3 10*3/uL — ABNORMAL HIGH (ref 0.1–1.0)
Monocytes Relative: 8 %
Neutro Abs: 14.4 10*3/uL — ABNORMAL HIGH (ref 1.7–7.7)
Neutrophils Relative %: 83 %
Platelets: 461 10*3/uL — ABNORMAL HIGH (ref 150–400)
RBC: 3.79 MIL/uL — ABNORMAL LOW (ref 4.22–5.81)
RDW: 15.8 % — ABNORMAL HIGH (ref 11.5–15.5)
WBC: 17.3 10*3/uL — ABNORMAL HIGH (ref 4.0–10.5)
nRBC: 0 % (ref 0.0–0.2)

## 2020-02-13 LAB — I-STAT ARTERIAL BLOOD GAS, ED
Acid-base deficit: 2 mmol/L (ref 0.0–2.0)
Bicarbonate: 23.8 mmol/L (ref 20.0–28.0)
Calcium, Ion: 1.15 mmol/L (ref 1.15–1.40)
HCT: 27 % — ABNORMAL LOW (ref 39.0–52.0)
Hemoglobin: 9.2 g/dL — ABNORMAL LOW (ref 13.0–17.0)
O2 Saturation: 100 %
Patient temperature: 100.5
Potassium: 2.5 mmol/L — CL (ref 3.5–5.1)
Sodium: 137 mmol/L (ref 135–145)
TCO2: 25 mmol/L (ref 22–32)
pCO2 arterial: 43.5 mmHg (ref 32.0–48.0)
pH, Arterial: 7.351 (ref 7.350–7.450)
pO2, Arterial: 475 mmHg — ABNORMAL HIGH (ref 83.0–108.0)

## 2020-02-13 LAB — TSH: TSH: 0.659 u[IU]/mL (ref 0.350–4.500)

## 2020-02-13 LAB — HEMOGLOBIN A1C
Hgb A1c MFr Bld: 5.3 % (ref 4.8–5.6)
Mean Plasma Glucose: 105.41 mg/dL

## 2020-02-13 LAB — COMPREHENSIVE METABOLIC PANEL
ALT: 17 U/L (ref 0–44)
AST: 32 U/L (ref 15–41)
Albumin: 3.7 g/dL (ref 3.5–5.0)
Alkaline Phosphatase: 31 U/L — ABNORMAL LOW (ref 38–126)
Anion gap: 26 — ABNORMAL HIGH (ref 5–15)
BUN: 19 mg/dL (ref 8–23)
CO2: 15 mmol/L — ABNORMAL LOW (ref 22–32)
Calcium: 8.9 mg/dL (ref 8.9–10.3)
Chloride: 96 mmol/L — ABNORMAL LOW (ref 98–111)
Creatinine, Ser: 1.91 mg/dL — ABNORMAL HIGH (ref 0.61–1.24)
GFR calc non Af Amer: 34 mL/min — ABNORMAL LOW (ref >60)
Glucose, Bld: 333 mg/dL — ABNORMAL HIGH (ref 70–99)
Potassium: 2.8 mmol/L — ABNORMAL LOW (ref 3.5–5.1)
Sodium: 137 mmol/L (ref 135–145)
Total Bilirubin: 0.5 mg/dL (ref 0.3–1.2)
Total Protein: 6.7 g/dL (ref 6.5–8.1)

## 2020-02-13 LAB — RESPIRATORY PANEL BY RT PCR (FLU A&B, COVID)
Influenza A by PCR: NEGATIVE
Influenza B by PCR: NEGATIVE
SARS Coronavirus 2 by RT PCR: NEGATIVE

## 2020-02-13 LAB — PHOSPHORUS: Phosphorus: 1.5 mg/dL — ABNORMAL LOW (ref 2.5–4.6)

## 2020-02-13 LAB — MAGNESIUM: Magnesium: 1 mg/dL — ABNORMAL LOW (ref 1.7–2.4)

## 2020-02-13 LAB — CREATININE, SERUM
Creatinine, Ser: 1.71 mg/dL — ABNORMAL HIGH (ref 0.61–1.24)
GFR calc non Af Amer: 39 mL/min — ABNORMAL LOW (ref >60)

## 2020-02-13 LAB — CK: Total CK: 128 U/L (ref 49–397)

## 2020-02-13 LAB — CBG MONITORING, ED
Glucose-Capillary: 110 mg/dL — ABNORMAL HIGH (ref 70–99)
Glucose-Capillary: 132 mg/dL — ABNORMAL HIGH (ref 70–99)

## 2020-02-13 LAB — LACTIC ACID, PLASMA
Lactic Acid, Venous: 1.9 mmol/L (ref 0.5–1.9)
Lactic Acid, Venous: 2.2 mmol/L (ref 0.5–1.9)

## 2020-02-13 LAB — T4, FREE: Free T4: 1.07 ng/dL (ref 0.61–1.12)

## 2020-02-13 MED ORDER — ORAL CARE MOUTH RINSE
15.0000 mL | OROMUCOSAL | Status: DC
  Administered 2020-02-14 – 2020-02-15 (×17): 15 mL via OROMUCOSAL

## 2020-02-13 MED ORDER — CHLORHEXIDINE GLUCONATE CLOTH 2 % EX PADS
6.0000 | MEDICATED_PAD | Freq: Every day | CUTANEOUS | Status: DC
  Administered 2020-02-14 – 2020-03-16 (×22): 6 via TOPICAL

## 2020-02-13 MED ORDER — LEVETIRACETAM IN NACL 1500 MG/100ML IV SOLN
1500.0000 mg | INTRAVENOUS | Status: AC
  Administered 2020-02-13: 1500 mg via INTRAVENOUS
  Filled 2020-02-13: qty 100

## 2020-02-13 MED ORDER — MIDAZOLAM 50MG/50ML (1MG/ML) PREMIX INFUSION
0.0000 mg/h | INTRAVENOUS | Status: DC
  Administered 2020-02-13: 2 mg/h via INTRAVENOUS
  Filled 2020-02-13: qty 50

## 2020-02-13 MED ORDER — POTASSIUM CHLORIDE 20 MEQ/15ML (10%) PO SOLN
40.0000 meq | ORAL | Status: AC
  Administered 2020-02-13 (×2): 40 meq
  Filled 2020-02-13 (×2): qty 30

## 2020-02-13 MED ORDER — FENTANYL CITRATE (PF) 100 MCG/2ML IJ SOLN: 25.0000 ug | INTRAMUSCULAR | Status: DC | PRN

## 2020-02-13 MED ORDER — SODIUM CHLORIDE 0.9 % IV BOLUS
1000.0000 mL | Freq: Once | INTRAVENOUS | Status: AC
  Administered 2020-02-13: 1000 mL via INTRAVENOUS

## 2020-02-13 MED ORDER — PANTOPRAZOLE SODIUM 40 MG IV SOLR
40.0000 mg | Freq: Every day | INTRAVENOUS | Status: DC
  Administered 2020-02-13 – 2020-02-23 (×11): 40 mg via INTRAVENOUS
  Filled 2020-02-13 (×11): qty 40

## 2020-02-13 MED ORDER — LACTATED RINGERS IV BOLUS
1000.0000 mL | Freq: Once | INTRAVENOUS | Status: AC
  Administered 2020-02-13: 1000 mL via INTRAVENOUS

## 2020-02-13 MED ORDER — VITAL HIGH PROTEIN PO LIQD
1000.0000 mL | ORAL | Status: DC
  Administered 2020-02-14: 1000 mL
  Filled 2020-02-13: qty 1000

## 2020-02-13 MED ORDER — LEVETIRACETAM IN NACL 1000 MG/100ML IV SOLN
1000.0000 mg | Freq: Two times a day (BID) | INTRAVENOUS | Status: DC
  Administered 2020-02-14 – 2020-02-23 (×20): 1000 mg via INTRAVENOUS
  Filled 2020-02-13 (×19): qty 100

## 2020-02-13 MED ORDER — PROSOURCE TF PO LIQD
45.0000 mL | Freq: Two times a day (BID) | ORAL | Status: DC
  Administered 2020-02-13 – 2020-02-14 (×2): 45 mL
  Filled 2020-02-13 (×2): qty 45

## 2020-02-13 MED ORDER — DOCUSATE SODIUM 100 MG PO CAPS: 100.0000 mg | ORAL_CAPSULE | Freq: Two times a day (BID) | ORAL | Status: DC | PRN

## 2020-02-13 MED ORDER — INSULIN ASPART 100 UNIT/ML ~~LOC~~ SOLN
0.0000 [IU] | SUBCUTANEOUS | Status: DC
  Administered 2020-02-13 – 2020-02-15 (×5): 1 [IU] via SUBCUTANEOUS
  Administered 2020-02-15: 2 [IU] via SUBCUTANEOUS
  Administered 2020-02-16 – 2020-02-22 (×8): 1 [IU] via SUBCUTANEOUS
  Administered 2020-02-23: 2 [IU] via SUBCUTANEOUS
  Administered 2020-02-23 – 2020-02-29 (×7): 1 [IU] via SUBCUTANEOUS

## 2020-02-13 MED ORDER — ETOMIDATE 2 MG/ML IV SOLN
INTRAVENOUS | Status: AC | PRN
  Administered 2020-02-13: 20 mg via INTRAVENOUS

## 2020-02-13 MED ORDER — MAGNESIUM SULFATE 2 GM/50ML IV SOLN
2.0000 g | Freq: Once | INTRAVENOUS | Status: AC
  Administered 2020-02-13: 2 g via INTRAVENOUS
  Filled 2020-02-13: qty 50

## 2020-02-13 MED ORDER — PROPOFOL 1000 MG/100ML IV EMUL
0.0000 ug/kg/min | INTRAVENOUS | Status: DC
  Administered 2020-02-13: 5 ug/kg/min via INTRAVENOUS
  Administered 2020-02-14 (×2): 25 ug/kg/min via INTRAVENOUS
  Administered 2020-02-14: 40 ug/kg/min via INTRAVENOUS
  Filled 2020-02-13 (×4): qty 100

## 2020-02-13 MED ORDER — HEPARIN SODIUM (PORCINE) 5000 UNIT/ML IJ SOLN
5000.0000 [IU] | Freq: Three times a day (TID) | INTRAMUSCULAR | Status: DC
  Administered 2020-02-13 – 2020-03-17 (×98): 5000 [IU] via SUBCUTANEOUS
  Filled 2020-02-13 (×97): qty 1

## 2020-02-13 MED ORDER — DOCUSATE SODIUM 50 MG/5ML PO LIQD
100.0000 mg | Freq: Two times a day (BID) | ORAL | Status: DC
  Administered 2020-02-13: 100 mg
  Filled 2020-02-13: qty 10

## 2020-02-13 MED ORDER — CHLORHEXIDINE GLUCONATE 0.12% ORAL RINSE (MEDLINE KIT)
15.0000 mL | Freq: Two times a day (BID) | OROMUCOSAL | Status: DC
  Administered 2020-02-14 – 2020-02-15 (×3): 15 mL via OROMUCOSAL

## 2020-02-13 MED ORDER — NOREPINEPHRINE 4 MG/250ML-% IV SOLN
2.0000 ug/min | INTRAVENOUS | Status: DC
  Administered 2020-02-13: 2 ug/min via INTRAVENOUS
  Filled 2020-02-13: qty 250

## 2020-02-13 MED ORDER — ROCURONIUM BROMIDE 50 MG/5ML IV SOLN
INTRAVENOUS | Status: AC | PRN
  Administered 2020-02-13: 100 mg via INTRAVENOUS

## 2020-02-13 MED ORDER — LORAZEPAM 2 MG/ML IJ SOLN: 2.0000 mg | Freq: Once | INTRAMUSCULAR | Status: AC

## 2020-02-13 MED ORDER — MIDAZOLAM BOLUS VIA INFUSION
1.0000 mg | INTRAVENOUS | Status: DC | PRN
  Filled 2020-02-13: qty 2

## 2020-02-13 MED ORDER — POLYETHYLENE GLYCOL 3350 17 G PO PACK: 17.0000 g | PACK | Freq: Every day | ORAL | Status: DC

## 2020-02-13 MED ORDER — LORAZEPAM 2 MG/ML IJ SOLN
INTRAMUSCULAR | Status: AC
  Administered 2020-02-13: 2 mg via INTRAVENOUS
  Filled 2020-02-13: qty 1

## 2020-02-13 MED ORDER — LEVETIRACETAM IN NACL 1000 MG/100ML IV SOLN: 1000.0000 mg | Freq: Two times a day (BID) | INTRAVENOUS | Status: DC

## 2020-02-13 MED ORDER — SODIUM CHLORIDE 0.9 % IV SOLN
250.0000 mL | INTRAVENOUS | Status: DC
  Administered 2020-02-13: 20 mL via INTRAVENOUS
  Administered 2020-02-18 – 2020-02-24 (×4): 250 mL via INTRAVENOUS

## 2020-02-13 MED ORDER — POTASSIUM CHLORIDE 10 MEQ/100ML IV SOLN
10.0000 meq | INTRAVENOUS | Status: AC
  Administered 2020-02-13 (×4): 10 meq via INTRAVENOUS
  Filled 2020-02-13 (×4): qty 100

## 2020-02-13 MED ORDER — POLYETHYLENE GLYCOL 3350 17 G PO PACK: 17.0000 g | PACK | Freq: Every day | ORAL | Status: DC | PRN

## 2020-02-13 MED ORDER — LACTATED RINGERS IV BOLUS: 1000.0000 mL | Freq: Once | INTRAVENOUS | Status: DC

## 2020-02-13 NOTE — Progress Notes (Signed)
STAT EEG complete - results pending. ? ?

## 2020-02-13 NOTE — ED Notes (Signed)
Daughter at bedside. Dr. Theora Gianotti at bedside speaking with daughter.

## 2020-02-13 NOTE — ED Notes (Signed)
EEG at bedside.

## 2020-02-13 NOTE — Progress Notes (Signed)
I spoke with patient's daughter Threasa Beards [phone].  She states they are from [place] and have were at [facility] this week.  Pt has been doing fairly well, but in the last few days has developed generalized weakness with multiple falls, poor appetite and hallucinations.  They were driving home to get medical help when he developed seizures en route.   No hx seizure disorder, denies recent fevers, infectious symptoms or known ill contacts.   Otilio Carpen Neesa Knapik, PA-C

## 2020-02-13 NOTE — Progress Notes (Signed)
Transported pt to CT with RN 

## 2020-02-13 NOTE — Consult Note (Addendum)
NEURO HOSPITALIST CONSULT NOTE   Requestig physician: Dr. Maryan Rued  Reason for Consult: Status epilepticus  History obtained from:  Chart     HPI:                                                                                                                                          Howard Allen is a 74 y.o. male with a history of Crohn's disease, HTN, HLD, depression and recent hospitalization for pneumatosis s/p ileocolic resection who was brought to the ED by [facility] after 3x witnessed tonic-clonic seizures. After EMS arrival patient had two more tonic-clonic seizures in the ambulance. He was given 5 mg midazolam. Seizure resolved and he became apneic. He was intubated on arrival to the ER.   Per chart, patient has no history of seizures but had multiple falls and seizures the last four days, for which he presented to another hospital. No notes on care everywhere.   CT head was done which showed no acute findings. Blood pressure was initially 591 systolic on arrival. Temperature was 100.7 with white blood cell count of 17.   No past medical history on file.  No family history on file.            Social History:  has no history on file for tobacco use, alcohol use, and drug use.  Allergies  Allergen Reactions  . Lisinopril Anaphylaxis, Cough and Other (See Comments)    Other reaction(s): anaphylaxis Pt states it made him "cough his head off"   . Lorazepam     Other reaction(s): Hallucinations    MEDICATIONS:                                                                                                                     Prior to Admission:  No current facility-administered medications on file prior to encounter.   Current Outpatient Medications on File Prior to Encounter  Medication Sig Dispense Refill  . budesonide (ENTOCORT EC) 3 MG 24 hr capsule Take 3 mg by mouth daily.    Marland Kitchen albuterol (VENTOLIN HFA) 108 (90 Base) MCG/ACT inhaler Inhale 2 puffs  into the lungs every 6 (six) hours as needed for wheezing or shortness of breath.     Marland Kitchen amLODipine (NORVASC) 2.5 MG  tablet Take 2.5 mg by mouth daily.    Marland Kitchen atorvastatin (LIPITOR) 20 MG tablet Take 20 mg by mouth daily.     . busPIRone (BUSPAR) 15 MG tablet Take 15 mg by mouth 2 (two) times daily.    . cyproheptadine (PERIACTIN) 4 MG tablet Take 4 mg by mouth 2 (two) times daily.    Marland Kitchen doxazosin (CARDURA) 1 MG tablet Take 1 mg by mouth daily.    . fenofibrate 160 MG tablet Take 160 mg by mouth daily.    Marland Kitchen HUMIRA PEN 40 MG/0.4ML PNKT Inject 40 mg into the skin every 14 (fourteen) days.     . hydrochlorothiazide (HYDRODIURIL) 25 MG tablet Take 25 mg by mouth daily.    . meclizine (ANTIVERT) 25 MG tablet Take 25 mg by mouth 2 (two) times daily as needed for dizziness.     . megestrol (MEGACE) 20 MG tablet Take 20 mg by mouth 2 (two) times daily.    . metoprolol succinate (TOPROL-XL) 100 MG 24 hr tablet Take 100 mg by mouth daily.    . pantoprazole (PROTONIX) 20 MG tablet Take 20 mg by mouth daily.    . promethazine (PHENERGAN) 12.5 MG tablet Take by mouth.    . sertraline (ZOLOFT) 100 MG tablet Take 100 mg by mouth 2 (two) times daily.      Continuous: . midazolam 2 mg/hr (02/13/20 1612)     ROS:                                                                                                                                       Unable to obtain due to intubation and sedation.    Blood pressure 99/64, pulse (!) 102, temperature 99.6 F (37.6 C), temperature source Temporal, resp. rate 16, height 5\' 10"  (1.778 m), SpO2 100 %.   General Examination:                                                                                                       Physical Exam  HEENT-  Dressing to right forehead laceration noted .  Lungs- Intubated, on full vent support Extremities- No cyanosis or pallor  Neurological Examination Mental Status: Unresponsive. Exam done 80 minutes after intubation  while on Versed gtt at a rate of 2.  Cranial Nerves: Unresponsive , Pupils non-reactive, 2 mm bilaterally, negative gag reflex, symmetrical facial flaccidity, negative oculocephalic reflex  Motor: Extremities flaccid with no movement to noxious.  Sensory: No reaction to painful  stimuli Deep Tendon Reflexes: 0 x 4 Plantars: Right: no response   Left: no response Cerebellar: Unable to test   Lab Results: Basic Metabolic Panel: No results for input(s): NA, K, CL, CO2, GLUCOSE, BUN, CREATININE, CALCIUM, MG, PHOS in the last 168 hours.  CBC: Recent Labs  Lab 02/13/20 1617  WBC 17.3*  NEUTROABS 14.4*  HGB 10.9*  HCT 36.3*  MCV 95.8  PLT 461*    Cardiac Enzymes: No results for input(s): CKTOTAL, CKMB, CKMBINDEX, TROPONINI in the last 168 hours.  Lipid Panel: No results for input(s): CHOL, TRIG, HDL, CHOLHDL, VLDL, LDLCALC in the last 168 hours.  Imaging: CT HEAD WO CONTRAST  Result Date: 02/13/2020 CLINICAL DATA:  Mental status change. Clinical notes state seizure. Multiple recent falls seen at different hospitals over the weekend. EXAM: CT HEAD WITHOUT CONTRAST TECHNIQUE: Contiguous axial images were obtained from the base of the skull through the vertex without intravenous contrast. COMPARISON:  None available. FINDINGS: Brain: Age related volume loss. Mild to moderate chronic small vessel ischemia. No intracranial hemorrhage, mass effect, or midline shift. No hydrocephalus. The basilar cisterns are patent. No evidence of territorial infarct or acute ischemia. No extra-axial or intracranial fluid collection. Vascular: No hyperdense vessel or unexpected calcification. Skull: No fracture or focal lesion. Sinuses/Orbits: Paranasal sinuses and mastoid air cells are clear. The visualized orbits are unremarkable. Bilateral cataract resection. Other: None. IMPRESSION: 1. No acute intracranial abnormality. 2. Age related volume loss and chronic small vessel ischemia. Electronically Signed   By:  Keith Rake M.D.   On: 02/13/2020 16:44   DG Chest Port 1 View  Result Date: 02/13/2020 CLINICAL DATA:  Tube placement EXAM: PORTABLE CHEST 1 VIEW COMPARISON:  None. FINDINGS: Endotracheal tube is approximately 6-7 cm from the carina. Enteric tube tip passes into the stomach with side port still within the distal esophagus. No consolidation or edema. No pleural effusion or pneumothorax. Normal heart size. IMPRESSION: Endotracheal tube approximately 6-7 cm above the carina. Enteric tube could be advanced for more optimal positioning. Electronically Signed   By: Macy Mis M.D.   On: 02/13/2020 16:29    Assessment: 74 year old male presenting in status epilepticus 1. Received 1500 mg IV load of Keppra as well as 2 mg IV Ativan and Versed. He was on a Versed drip at 2 mg/hr, now on Propofol gtt.   2. Intubated for airway protection by ED staff 3. No clinical seizure activity on exam in the context of recent intubation, sedating medications and low-dose Versed gtt.  4. Ca and Na normal. Glucose is 333. K 2.8. Ordering a Mg level.  5. Leukocytosis with WBC of 17.3 and febrile to 100.5. 6. CT head shows no acute intracranial abnormality. There is age related volume loss and chronic small vessel ischemia. 7. Possible infectious etiology with leukocytosis and fever, although this could be secondary to his seizure. Unable to see notes from most recent hospitalization as these do not appear to be on Care Everywhere. He is currently non-responsive, last received paralytic a little over an hour ago. Possibly still experiencing effects of paralytic but would expect some reflexes present at this point.    Recommendations: 1. STAT EEG has been ordered and technician notified.  2. Work-up initiated in the ER - UA, UDS, CK, blood cultures, lactic acid, Magnesium, TSH 3. MRI brain 5. Leukocytosis and neutrophilic leukocytosis possibly secondary to status, may need LP.  6. Continue propofol gtt, further  recommendations pending EEG.  7. Continue Keppra  at 1000 mg IV BID (ordered) 8. Frequent neuro checks   Marty Heck, DO 02/13/2020, 6:59 PM Pager: [phone]  Addendum: 1. EEG completed: ABNORMALITY Continuous slow, generalized IMPRESSION: This study is suggestive of severe diffuse encephalopathy, nonspecific etiology but likely related to sedation. No seizures or epileptiform discharges were seen throughout the recording. 2. Mg came back low at 1.0. He is being infused with magnesium sulfate in the ED 3. Phosphorus came back low at 1.5.   45 minutes spent in the emergent neurological evaluation and management of this critically ill patient.   Electronically signed: Dr. Kerney Elbe

## 2020-02-13 NOTE — ED Triage Notes (Signed)
Patient brought in by [facility] after 3x witnessed tonic-clonic seizures. On EMS arrival to scene, patient had two more tonic-clonic seizures in ambulance. Patient received 5mg  midazolam, stopped seizing, became apneic. Patient's family reports no history of seizures but was seen for multiple falls and seizures at different hospitals over the weekend. Patient arrives to department unresponsive and apneic.

## 2020-02-13 NOTE — ED Notes (Signed)
Providers at bedside inserting central line.

## 2020-02-13 NOTE — ED Notes (Signed)
Pt to and from CT with this RN and RT.

## 2020-02-13 NOTE — Procedures (Signed)
Central Venous Catheter Insertion Procedure Note  Vedansh Kerstetter  [id number]  Jun 03, 1945  Date:02/13/20  Time:6:58 PM   Provider Performing:Marshawn Ninneman R Patra Gherardi   Procedure: Insertion of Non-tunneled Central Venous Catheter(36556) with US guidance (70761)   Indication(s) Medication administration  Consent Unable to obtain consent due to emergent nature of procedure.  Anesthesia Topical only with 1% lidocaine   Timeout Verified patient identification, verified procedure, site/side was marked, verified correct patient position, special equipment/implants available, medications/allergies/relevant history reviewed, required imaging and test results available.  Sterile Technique Maximal sterile technique including full sterile barrier drape, hand hygiene, sterile gown, sterile gloves, mask, hair covering, sterile ultrasound probe cover (if used).  Procedure Description Area of catheter insertion was cleaned with chlorhexidine and draped in sterile fashion.  With real-time ultrasound guidance a central venous catheter was placed into the right femoral vein. Nonpulsatile blood flow and easy flushing noted in all ports.  The catheter was sutured in place and sterile dressing applied.  Complications/Tolerance None; patient tolerated the procedure well. Chest X-ray is ordered to verify placement for internal jugular or subclavian cannulation.   Chest x-ray is not ordered for femoral cannulation.  EBL Minimal  Specimen(s) None  Otilio Carpen Domnique Vanegas, PA-C

## 2020-02-13 NOTE — Procedures (Signed)
Patient Name: Montrae Braithwaite  MRN: [id number]  Epilepsy Attending: Lora Havens  Referring Physician/Provider: Dr Verna Czech Date: 02/13/2020 Duration: 25.21 mins  Patient history: 74yo M with new onset status epilepticus, EEG to evaluate for seizure.  Level of alertness:  comatose  AEDs during EEG study: Propofol  Technical aspects: This EEG study was done with scalp electrodes positioned according to the 10-20 International system of electrode placement. Electrical activity was acquired at a sampling rate of 500Hz  and reviewed with a high frequency filter of 70Hz  and a low frequency filter of 1Hz . EEG data were recorded continuously and digitally stored.   Description: EEG showed continuous generalized 3 to 6 Hz theta-delta slowing admixed with intermittent maximal frontal 15-18 Hz beta activity.  Hyperventilation and photic stimulation were not performed.     ABNORMALITY -Continuous slow, generalized  IMPRESSION: This study is suggestive of severe diffuse encephalopathy, nonspecific etiology but likely related to sedation. No seizures or epileptiform discharges were seen throughout the recording.  Esaw Knippel Barbra Sarks

## 2020-02-13 NOTE — ED Provider Notes (Addendum)
[facility] EMERGENCY DEPARTMENT Provider Note   CSN: [id number] Arrival date & time: 02/13/20  1551     History Chief Complaint  Patient presents with  . Seizures    Howard Allen is a 74 y.o. male.  Patient is a 74 year old male who is from [place] and had been on vacation and is currently traveling back to [place] when he began seizing today in the car.  Initially on arrival patient had no further details but patient's daughter arrived and gave a full history.  Patient does have a history of recent bowel obstruction status post resection in July who lives at home independently and has been doing fairly well.  The family decided to go on vacation and left on Friday evening.  Daughter reports Saturday morning he had seizure-like activity at home.  She reported he was jerking and lost consciousness.  They went to a local emergency room where she reported they evaluated him ruled out stroke and he was back at his baseline and was discharged.  They were closer to their condo in destination then they were to home so they decided to go to their destination.  She reported for the first 2 days Sunday and Monday he slept all day and was very agitated but then Tuesday woke up and had lots of energy and seemed to be his normal self.  He ate well and had been doing okay.  However she reports he has been a fall risk for several months now and does not use a walker and is wobbly on his feet.  He had a fall last night where he fell backwards and hit his head.  Paramedics were called but reported the wound was superficial and he did not go to the hospital.  However later he fell again and was talking gibberish per daughter and hit his right eyebrow.  He went to an emergency room and received 10 stitches.  They did not change any medications and told him everything else was okay.  They were driving back today to get him back home to [place] because he has not been doing well when he  developed seizures in the car.  She reported he had 3 episodes of generalized shaking with tonic-clonic activity and loss of consciousness.  When EMS arrived he had 3 other brief seizures and was not responding.  He was given 10 mg of Versed and had decreased respirations and they started bag valve ventilating.  Oxygen has remained at 100 patient has remained tachycardic and hypertensive.  Upon arrival here patient is not able to give any history.  His blood sugar have been normal.  Patient takes no anticoagulation and takes no medications for seizures.  The history is provided by a relative and the EMS personnel. The history is limited by the condition of the patient.  Seizures Seizure activity on arrival: yes   Seizure type: eyes deviated to the left without jerking.      No past medical history on file.  Patient Active Problem List   Diagnosis Date Noted  . Seizure ([place]) 02/13/2020         No family history on file.  Social History   Tobacco Use  . Smoking status: Not on file  Substance Use Topics  . Alcohol use: Not on file  . Drug use: Not on file    Home Medications Prior to Admission medications   Medication Sig Start Date End Date Taking? Authorizing Provider  budesonide (ENTOCORT EC)  3 MG 24 hr capsule Take 3 mg by mouth daily.   Yes [provider]  albuterol (VENTOLIN HFA) 108 (90 Base) MCG/ACT inhaler Inhale 2 puffs into the lungs every 6 (six) hours as needed for wheezing or shortness of breath.  08/27/19   [provider]  amLODipine (NORVASC) 2.5 MG tablet Take 2.5 mg by mouth daily. 01/30/20   [provider]  atorvastatin (LIPITOR) 20 MG tablet Take 20 mg by mouth daily.  01/30/20   [provider]  busPIRone (BUSPAR) 15 MG tablet Take 15 mg by mouth 2 (two) times daily. 01/30/20   [provider]  cyproheptadine (PERIACTIN) 4 MG tablet Take 4 mg by mouth 2 (two) times daily. 01/30/20   [provider]    doxazosin (CARDURA) 1 MG tablet Take 1 mg by mouth daily. 08/27/19   [provider]  fenofibrate 160 MG tablet Take 160 mg by mouth daily. 01/30/20   [provider]  HUMIRA PEN 40 MG/0.4ML PNKT Inject 40 mg into the skin every 14 (fourteen) days.  10/05/19   [provider]  hydrochlorothiazide (HYDRODIURIL) 25 MG tablet Take 25 mg by mouth daily. 08/28/19   [provider]  meclizine (ANTIVERT) 25 MG tablet Take 25 mg by mouth 2 (two) times daily as needed for dizziness.  10/12/19   [provider]  megestrol (MEGACE) 20 MG tablet Take 20 mg by mouth 2 (two) times daily. 12/03/19   [provider]  metoprolol succinate (TOPROL-XL) 100 MG 24 hr tablet Take 100 mg by mouth daily. 01/30/20   [provider]  pantoprazole (PROTONIX) 20 MG tablet Take 20 mg by mouth daily. 01/30/20   [provider]  promethazine (PHENERGAN) 12.5 MG tablet Take by mouth. 12/03/19   [provider]  sertraline (ZOLOFT) 100 MG tablet Take 100 mg by mouth 2 (two) times daily. 08/27/19   [provider]    Allergies    Lisinopril and Lorazepam  Review of Systems   Review of Systems  Unable to perform ROS: Acuity of condition  Neurological: Positive for seizures.    Physical Exam Updated Vital Signs BP (!) 87/61   Pulse 91   Temp (!) 100.5 F (38.1 C) (Rectal)   Resp 16   Ht 5\' 10"  (1.778 m)   SpO2 100%   Physical Exam Vitals and nursing note reviewed.  Constitutional:      General: He is in acute distress.     Appearance: He is well-developed. He is ill-appearing and diaphoretic.     Comments: Patient is unresponsive  HENT:     Head: Normocephalic and atraumatic.  Eyes:     Conjunctiva/sclera: Conjunctivae normal.     Pupils: Pupils are equal, round, and reactive to light.     Comments: Eyes are deviated to the left bilaterally  Cardiovascular:     Rate and Rhythm: Regular rhythm. Tachycardia present.     Heart  sounds: No murmur heard.   Pulmonary:     Effort: Pulmonary effort is normal. No respiratory distress.     Breath sounds: Normal breath sounds. No wheezing or rales.  Abdominal:     General: There is no distension.     Palpations: Abdomen is soft.     Tenderness: There is no abdominal tenderness. There is no guarding or rebound.  Musculoskeletal:        General: No tenderness. Normal range of motion.     Cervical back: Normal range of motion  and neck supple.     Right lower leg: No edema.     Left lower leg: No edema.  Skin:    General: Skin is warm.     Findings: No erythema or rash.  Neurological:     Mental Status: He is unresponsive.     Comments: No evidence of tonic-clonic activity.  No clonus  Psychiatric:     Comments: Unresponsive      ED Results / Procedures / Treatments   Labs (all labs ordered are listed, but only abnormal results are displayed) Labs Reviewed  CBC WITH DIFFERENTIAL/PLATELET - Abnormal; Notable for the following components:      Result Value   WBC 17.3 (*)    RBC 3.79 (*)    Hemoglobin 10.9 (*)    HCT 36.3 (*)    RDW 15.8 (*)    Platelets 461 (*)    Neutro Abs 14.4 (*)    Monocytes Absolute 1.3 (*)    Abs Immature Granulocytes 0.31 (*)    All other components within normal limits  COMPREHENSIVE METABOLIC PANEL - Abnormal; Notable for the following components:   Potassium 2.8 (*)    Chloride 96 (*)    CO2 15 (*)    Glucose, Bld 333 (*)    Creatinine, Ser 1.91 (*)    Alkaline Phosphatase 31 (*)    GFR calc non Af Amer 34 (*)    Anion gap 26 (*)    All other components within normal limits  RESPIRATORY PANEL BY RT PCR (FLU A&B, COVID)  CULTURE, BLOOD (ROUTINE X 2)  CULTURE, BLOOD (ROUTINE X 2)  URINALYSIS, ROUTINE W REFLEX MICROSCOPIC  BLOOD GAS, ARTERIAL  TRIGLYCERIDES  CBC  CREATININE, SERUM  CBC  BASIC METABOLIC PANEL  BLOOD GAS, ARTERIAL  MAGNESIUM  PHOSPHORUS    EKG None  Radiology CT HEAD WO CONTRAST  Result Date:  02/13/2020 CLINICAL DATA:  Mental status change. Clinical notes state seizure. Multiple recent falls seen at different hospitals over the weekend. EXAM: CT HEAD WITHOUT CONTRAST TECHNIQUE: Contiguous axial images were obtained from the base of the skull through the vertex without intravenous contrast. COMPARISON:  None available. FINDINGS: Brain: Age related volume loss. Mild to moderate chronic small vessel ischemia. No intracranial hemorrhage, mass effect, or midline shift. No hydrocephalus. The basilar cisterns are patent. No evidence of territorial infarct or acute ischemia. No extra-axial or intracranial fluid collection. Vascular: No hyperdense vessel or unexpected calcification. Skull: No fracture or focal lesion. Sinuses/Orbits: Paranasal sinuses and mastoid air cells are clear. The visualized orbits are unremarkable. Bilateral cataract resection. Other: None. IMPRESSION: 1. No acute intracranial abnormality. 2. Age related volume loss and chronic small vessel ischemia. Electronically Signed   By: Keith Rake M.D.   On: 02/13/2020 16:44   DG Chest Port 1 View  Result Date: 02/13/2020 CLINICAL DATA:  Tube placement EXAM: PORTABLE CHEST 1 VIEW COMPARISON:  None. FINDINGS: Endotracheal tube is approximately 6-7 cm from the carina. Enteric tube tip passes into the stomach with side port still within the distal esophagus. No consolidation or edema. No pleural effusion or pneumothorax. Normal heart size. IMPRESSION: Endotracheal tube approximately 6-7 cm above the carina. Enteric tube could be advanced for more optimal positioning. Electronically Signed   By: Macy Mis M.D.   On: 02/13/2020 16:29    Procedures Procedure Name: Intubation Date/Time: 02/13/2020 5:59 PM Performed by: Blanchie Dessert, MD Pre-anesthesia Checklist: Patient identified, Patient being monitored, Emergency Drugs available, Timeout performed and Suction available  Oxygen Delivery Method: Ambu bag Preoxygenation:  Pre-oxygenation with 100% oxygen Induction Type: Rapid sequence Ventilation: Mask ventilation without difficulty Laryngoscope Size: Glidescope and 4 Tube size: 7.5 mm Number of attempts: 1 Placement Confirmation: ETT inserted through vocal cords under direct vision,  CO2 detector and Breath sounds checked- equal and bilateral Secured at: 23 cm Tube secured with: ETT holder Dental Injury: Teeth and Oropharynx as per pre-operative assessment  Difficulty Due To: Difficulty was unanticipated      (including critical care time)  Medications Ordered in ED Medications  0.9 %  sodium chloride infusion (has no administration in time range)  norepinephrine (LEVOPHED) 4mg  in 270mL premix infusion (has no administration in time range)  pantoprazole (PROTONIX) injection 40 mg (has no administration in time range)  docusate (COLACE) 50 MG/5ML liquid 100 mg (has no administration in time range)  polyethylene glycol (MIRALAX / GLYCOLAX) packet 17 g (has no administration in time range)  propofol (DIPRIVAN) 1000 MG/100ML infusion (has no administration in time range)  fentaNYL (SUBLIMAZE) injection 25 mcg (has no administration in time range)  fentaNYL (SUBLIMAZE) injection 25-100 mcg (has no administration in time range)  docusate sodium (COLACE) capsule 100 mg (has no administration in time range)  polyethylene glycol (MIRALAX / GLYCOLAX) packet 17 g (has no administration in time range)  heparin injection 5,000 Units (has no administration in time range)  LORazepam (ATIVAN) injection 2 mg (2 mg Intravenous Given 02/13/20 1556)  levETIRAcetam (KEPPRA) IVPB 1500 mg/ 100 mL premix (0 mg Intravenous Stopped 02/13/20 1608)  etomidate (AMIDATE) injection (20 mg Intravenous Given 02/13/20 1558)  rocuronium (ZEMURON) injection (100 mg Intravenous Given 02/13/20 1558)  sodium chloride 0.9 % bolus 1,000 mL (0 mLs Intravenous Stopped 02/13/20 1647)    ED Course  I have reviewed the triage vital signs and the  nursing notes.  Pertinent labs & imaging results that were available during my care of the patient were reviewed by me and considered in my medical decision making (see chart for details).    MDM Rules/Calculators/A&P                          Patient presenting today with symptoms concerning for status epilepticus.  Patient's eyes are still deviated to the left despite receiving 10 of Versed.  Patient is breathing but does not have significant gag and is not protecting his airway.  Patient was given 1500 mg of Keppra as well as 2 mg of Ativan with ongoing deviation of his pupils.  He was intubated for airway protection and then started on a Versed drip at 2.  Patient's eyes are now midline.  Head CT is negative for acute bleed.  EKG shows diffuse ST depression inferior laterally and anteriorly.  Patient's blood pressure was stable but after a few hours on the Versed drip he started to drift down despite an IV fluid bolus.  This is most likely related to medication.  Chest x-ray shows ET tube in appropriate position and no evidence of pneumonia.  Patient does have mild hypokalemia of 2.8 and a creatinine of 1.91 without known baseline or if patient has CKD.  Spoke with Dr. Cheral Marker with neurology service due to concern for status epilepticus they will get an EEG.  Patient will need admission to the ICU service.  No antibiotics given at this time as there is not distinct signs of infection.  Elevated lactate would be due to recent seizures.  Leukocytosis of 17,000 of  unknown significance.  MDM Number of Diagnoses or Management Options   Amount and/or Complexity of Data Reviewed Clinical lab tests: ordered and reviewed Tests in the radiology section of CPT: ordered and reviewed Tests in the medicine section of CPT: ordered and reviewed Decide to obtain previous medical records or to obtain history from someone other than the patient: yes Obtain history from someone other than the patient: yes Review  and summarize past medical records: yes Discuss the patient with other providers: yes Independent visualization of images, tracings, or specimens: yes  Risk of Complications, Morbidity, and/or Mortality Presenting problems: high Diagnostic procedures: high Management options: high  Patient Progress Patient progress: improved  CRITICAL CARE Performed by: Muskan Bolla Total critical care time: 40 minutes Critical care time was exclusive of separately billable procedures and treating other patients. Critical care was necessary to treat or prevent imminent or life-threatening deterioration. Critical care was time spent personally by me on the following activities: development of treatment plan with patient and/or surrogate as well as nursing, discussions with consultants, evaluation of patient's response to treatment, examination of patient, obtaining history from patient or surrogate, ordering and performing treatments and interventions, ordering and review of laboratory studies, ordering and review of radiographic studies, pulse oximetry and re-evaluation of patient's condition.  Final Clinical Impression(s) / ED Diagnoses Final diagnoses:  Status epilepticus ([place])  Hypokalemia    Rx / DC Orders ED Discharge Orders    None       Blanchie Dessert, MD 02/13/20 1759    Blanchie Dessert, MD 02/13/20 1800

## 2020-02-13 NOTE — H&P (Signed)
NAME:  Howard Allen, MRN:  [id number], DOB:  08/07/1945, LOS: 0 ADMISSION DATE:  02/13/2020, CONSULTATION DATE:  02/13/20 REFERRING MD:  Cheral Marker, CHIEF COMPLAINT:  seizure  Brief History   74 y.o. M who presented with multiple tonic, clonic seizures and status epilepticus, intubated and given Keppra.  CT head negative.  PCCM consulted for admission.  History of present illness   Howard Allen is a 74 y.o. M with PMH of  Crohn's Disease, HTN, HL, Depressio, hospitalization in 10/2019 for pneumatosis s/p ileocolic resection and lysis of adhesions who has had mulitple recent seizures and was treated at outside hospitals.   Pt was brought in by EMS for at least three generalized tonic-clonic seizures today.  He presented unresponsive and was intubated on arrival.    Initial head CT negative, he remains unresponsive >60 mins after RSI medications.   Neurology has evaluated and EEG is pending.   Labs are largely pending, Bmp with K 2.8 and creatinine of 1.9  Past Medical History  Crohn's Disease and SBO, HTN, HL, Depression  Significant Hospital Events   10/6 Admit to PCCM  Consults:  Neurology  Procedures:  10/6 ETT  Significant Diagnostic Tests:  10/6 CT head>> no acute findings, Age related volume loss and chronic small vessel ischemia.  Micro Data:  10/6 Covid-19, flu>>negative  Antimicrobials:     Interim history/subjective:  As above  Objective   Blood pressure (!) 87/61, pulse 91, temperature (!) 100.5 F (38.1 C), temperature source Rectal, resp. rate 16, height 5\' 10"  (1.778 m), SpO2 100 %.    Vent Mode: PRVC FiO2 (%):  [100 %] 100 % Set Rate:  [16 bmp] 16 bmp Vt Set:  [580 mL] 580 mL PEEP:  [5 cmH20] 5 cmH20 Plateau Pressure:  [15 cmH20] 15 cmH20   Intake/Output Summary (Last 24 hours) at 02/13/2020 1740 Last data filed at 02/13/2020 1647 Gross per 24 hour  Intake 1089.89 ml  Output --  Net 1089.89 ml   There were no vitals filed for this visit.  General:   Elderly M, thin, intubated HEENT: MM pink/moist, ETT in place Neuro: no tonic/clonic movement currently No gag, does not withdraw to pain, R pupil fixed 51mm, L equal and sluggish, negative corneal reflex  CV: s1s2 rr, no m/r/g PULM:  On full vent support, no wheezing or rhonci GI: soft, bsx4 active  Extremities: warm/dry, no edema  Skin: no rashes or lesions   Resolved Hospital Problem list     Assessment & Plan:    New onset tonic/clonic seizures with concern for status epilepticus -Loaded with Keppra, Versed, Ativan,  negative head CT -EEG pending, appreciate Neurology recs -propofol for sedation -Check CK and lactic acid, TSH, T4 -Care everywhere with visit to neurology in the last 6 months for new onset tremor, unclear etiology -low grade fever, monitor for signs of infection/aspiration -Check UA and UDS    Hypokalemia K initially 2.8 -Replace IV and po -Check Mag level  -repeat BMET in the AM   Hypotension Likely secondary to sedation -improved with peripheral vasopressors -Levophed to maintain MAP >65, will likely need CVC   Renal Insufficiency, chronic Creatinine 1.9, Baseline creatinine 2.0 in care everwhere -Follow renal indices, electrolytes and UOP   AGMA Likely secondary to seizure  HTN -Hold home medications     Best practice:  Diet: NPO Pain/Anxiety/Delirium protocol (if indicated): Propofol/Fentanyk VAP protocol (if indicated): HOPB 30 degrees, suction prn DVT prophylaxis: heparin GI prophylaxis: protonix Glucose control: SSI  Mobility: bed rest Code Status: full code Family Communication: no numbers in chart, will attempt to find in care everywhere Disposition: ICU  Labs   CBC: Recent Labs  Lab 02/13/20 1617  WBC 17.3*  NEUTROABS 14.4*  HGB 10.9*  HCT 36.3*  MCV 95.8  PLT 461*    Basic Metabolic Panel: Recent Labs  Lab 02/13/20 1617  NA 137  K 2.8*  CL 96*  CO2 15*  GLUCOSE 333*  BUN 19  CREATININE 1.91*  CALCIUM  8.9   GFR: CrCl cannot be calculated (Unknown ideal weight.). Recent Labs  Lab 02/13/20 1617  WBC 17.3*    Liver Function Tests: Recent Labs  Lab 02/13/20 1617  AST 32  ALT PENDING  ALKPHOS 31*  BILITOT 0.5  PROT 6.7  ALBUMIN 3.7   No results for input(s): LIPASE, AMYLASE in the last 168 hours. No results for input(s): AMMONIA in the last 168 hours.  ABG No results found for: PHART, PCO2ART, PO2ART, HCO3, TCO2, ACIDBASEDEF, O2SAT   Coagulation Profile: No results for input(s): INR, PROTIME in the last 168 hours.  Cardiac Enzymes: No results for input(s): CKTOTAL, CKMB, CKMBINDEX, TROPONINI in the last 168 hours.  HbA1C: No results found for: HGBA1C  CBG: No results for input(s): GLUCAP in the last 168 hours.  Review of Systems:   Unable to obtain  Past Medical History  He,  has no past medical history on file.   Surgical History     Social History      Family History   His family history is not on file.   Allergies Allergies  Allergen Reactions  . Lisinopril Anaphylaxis, Cough and Other (See Comments)    Other reaction(s): anaphylaxis Pt states it made him "cough his head off"   . Lorazepam     Other reaction(s): Hallucinations     Home Medications  Prior to Admission medications   Medication Sig Start Date End Date Taking? Authorizing Provider  budesonide (ENTOCORT EC) 3 MG 24 hr capsule Take 3 mg by mouth daily.   Yes [provider]  albuterol (VENTOLIN HFA) 108 (90 Base) MCG/ACT inhaler Inhale 2 puffs into the lungs every 6 (six) hours as needed for wheezing or shortness of breath.  08/27/19   [provider]  amLODipine (NORVASC) 2.5 MG tablet Take 2.5 mg by mouth daily. 01/30/20   [provider]  atorvastatin (LIPITOR) 20 MG tablet Take 20 mg by mouth daily.  01/30/20   [provider]  busPIRone (BUSPAR) 15 MG tablet Take 15 mg by mouth 2 (two) times daily. 01/30/20   [provider]    cyproheptadine (PERIACTIN) 4 MG tablet Take 4 mg by mouth 2 (two) times daily. 01/30/20   [provider]  doxazosin (CARDURA) 1 MG tablet Take 1 mg by mouth daily. 08/27/19   [provider]  fenofibrate 160 MG tablet Take 160 mg by mouth daily. 01/30/20   [provider]  HUMIRA PEN 40 MG/0.4ML PNKT Inject 40 mg into the skin every 14 (fourteen) days.  10/05/19   [provider]  hydrochlorothiazide (HYDRODIURIL) 25 MG tablet Take 25 mg by mouth daily. 08/28/19   [provider]  meclizine (ANTIVERT) 25 MG tablet Take 25 mg by mouth 2 (two) times daily as needed for dizziness.  10/12/19   [provider]  megestrol (MEGACE) 20 MG tablet Take 20 mg by mouth 2 (two) times daily. 12/03/19   [provider]  metoprolol succinate (TOPROL-XL) 100  MG 24 hr tablet Take 100 mg by mouth daily. 01/30/20   [provider]  pantoprazole (PROTONIX) 20 MG tablet Take 20 mg by mouth daily. 01/30/20   [provider]  promethazine (PHENERGAN) 12.5 MG tablet Take by mouth. 12/03/19   [provider]  sertraline (ZOLOFT) 100 MG tablet Take 100 mg by mouth 2 (two) times daily. 08/27/19   [provider]     Critical care time: 45 minutes    CRITICAL CARE Performed by: Otilio Carpen Kyndall Amero   Total critical care time: 45 minutes  Critical care time was exclusive of separately billable procedures and treating other patients.  Critical care was necessary to treat or prevent imminent or life-threatening deterioration.  Critical care was time spent personally by me on the following activities: development of treatment plan with patient and/or surrogate as well as nursing, discussions with consultants, evaluation of patient's response to treatment, examination of patient, obtaining history from patient or surrogate, ordering and performing treatments and interventions, ordering and review of laboratory studies, ordering and review  of radiographic studies, pulse oximetry and re-evaluation of patient's condition.   Otilio Carpen Amirra Herling, PA-C

## 2020-02-13 NOTE — ED Notes (Signed)
Providers at bedside.

## 2020-02-13 NOTE — Progress Notes (Signed)
Critical value K results that showed up on ABG results were given to RN. RN states that that result correlates with lab results.

## 2020-02-13 NOTE — H&P (Signed)
02/13/2020  Critical Care Attending H+P I have seen and evaluated the patient for seizures and respiratory failure.   S:  74 year old man with hx of Crohn's, prior SBO, HTN, HLD, depression presenting with 1 week of falls, hallucinations and today witnessed recurrent seizures and status epilepticus culminating in respiratory failure requiring intubation.  Patient is currently intubated/sedated so history is per chart review.   O: Blood pressure (!) 87/61, pulse 91, temperature (!) 100.5 F (38.1 C), temperature source Rectal, resp. rate 16, height 5\' 10"  (1.778 m), weight 68.9 kg, SpO2 100 %.  Unresponsive man on vent No cough, no gag, sluggish pupils, no corneals, GCS3 Lungs clear, passive on vent Ext warm Dried blood over feet and ext  WBC high K low + anion gap Glucose 330  A:  -Status epilepticus- given etomidate and roc for intubation, I would think it would be out of his system by now but will give some more time. -Acute hypoxemic respiratory failure due to inability to protect airway -Hx Crohn's - Hypotension question related to sedation, looks dry as well -Low grade fever, CXR benign - Hyperglycemia - Gap acidosis likely lactic from seizures  P:  - EEG, versed gtt fine for now - Appreciate neuro recs, AEDs at their discretion - f/u UDS, TSH, lactate, CK - Replete K - Vent support, VAP prevention bundle - SSI, may need tighter control  - Hold on Abx for now  The patient is critically ill with multiple organ systems failure and requires high complexity decision making for assessment and support, frequent evaluation and titration of therapies, application of advanced monitoring technologies and extensive interpretation of multiple databases. Critical Care Time devoted to patient care services described in this note independent of APP/resident  time is 45 minutes.   02/13/2020 Erskine Emery MD

## 2020-02-14 ENCOUNTER — Inpatient Hospital Stay (HOSPITAL_COMMUNITY): Payer: Medicare Other

## 2020-02-14 DIAGNOSIS — G039 Meningitis, unspecified: Secondary | ICD-10-CM | POA: Diagnosis not present

## 2020-02-14 DIAGNOSIS — G40901 Epilepsy, unspecified, not intractable, with status epilepticus: Secondary | ICD-10-CM | POA: Diagnosis not present

## 2020-02-14 DIAGNOSIS — R569 Unspecified convulsions: Secondary | ICD-10-CM | POA: Diagnosis not present

## 2020-02-14 LAB — CBC
HCT: 27.4 % — ABNORMAL LOW (ref 39.0–52.0)
Hemoglobin: 9.1 g/dL — ABNORMAL LOW (ref 13.0–17.0)
MCH: 29 pg (ref 26.0–34.0)
MCHC: 33.2 g/dL (ref 30.0–36.0)
MCV: 87.3 fL (ref 80.0–100.0)
Platelets: 281 10*3/uL (ref 150–400)
RBC: 3.14 MIL/uL — ABNORMAL LOW (ref 4.22–5.81)
RDW: 15.5 % (ref 11.5–15.5)
WBC: 9 10*3/uL (ref 4.0–10.5)
nRBC: 0 % (ref 0.0–0.2)

## 2020-02-14 LAB — BLOOD GAS, ARTERIAL
Acid-Base Excess: 0.8 mmol/L (ref 0.0–2.0)
Bicarbonate: 23.7 mmol/L (ref 20.0–28.0)
Drawn by: 56002
FIO2: 40
O2 Saturation: 99.3 %
Patient temperature: 37
pCO2 arterial: 30.5 mmHg — ABNORMAL LOW (ref 32.0–48.0)
pH, Arterial: 7.502 — ABNORMAL HIGH (ref 7.350–7.450)
pO2, Arterial: 140 mmHg — ABNORMAL HIGH (ref 83.0–108.0)

## 2020-02-14 LAB — BASIC METABOLIC PANEL
Anion gap: 14 (ref 5–15)
BUN: 17 mg/dL (ref 8–23)
CO2: 21 mmol/L — ABNORMAL LOW (ref 22–32)
Calcium: 8.7 mg/dL — ABNORMAL LOW (ref 8.9–10.3)
Chloride: 103 mmol/L (ref 98–111)
Creatinine, Ser: 1.44 mg/dL — ABNORMAL HIGH (ref 0.61–1.24)
GFR calc non Af Amer: 48 mL/min — ABNORMAL LOW (ref >60)
Glucose, Bld: 102 mg/dL — ABNORMAL HIGH (ref 70–99)
Potassium: 3.4 mmol/L — ABNORMAL LOW (ref 3.5–5.1)
Sodium: 138 mmol/L (ref 135–145)

## 2020-02-14 LAB — CSF CELL COUNT WITH DIFFERENTIAL
Eosinophils, CSF: 0 % (ref 0–1)
Lymphs, CSF: 32 % — ABNORMAL LOW (ref 40–80)
Monocyte-Macrophage-Spinal Fluid: 4 % — ABNORMAL LOW (ref 15–45)
RBC Count, CSF: 1 /mm3 — ABNORMAL HIGH
Segmented Neutrophils-CSF: 64 % — ABNORMAL HIGH (ref 0–6)
Tube #: 3
WBC, CSF: 140 /mm3 (ref 0–5)

## 2020-02-14 LAB — GLUCOSE, CAPILLARY
Glucose-Capillary: 103 mg/dL — ABNORMAL HIGH (ref 70–99)
Glucose-Capillary: 104 mg/dL — ABNORMAL HIGH (ref 70–99)
Glucose-Capillary: 122 mg/dL — ABNORMAL HIGH (ref 70–99)
Glucose-Capillary: 130 mg/dL — ABNORMAL HIGH (ref 70–99)
Glucose-Capillary: 143 mg/dL — ABNORMAL HIGH (ref 70–99)

## 2020-02-14 LAB — RAPID URINE DRUG SCREEN, HOSP PERFORMED
Amphetamines: NOT DETECTED
Barbiturates: NOT DETECTED
Benzodiazepines: POSITIVE — AB
Cocaine: NOT DETECTED
Opiates: NOT DETECTED
Tetrahydrocannabinol: NOT DETECTED

## 2020-02-14 LAB — MRSA PCR SCREENING: MRSA by PCR: POSITIVE — AB

## 2020-02-14 LAB — MAGNESIUM: Magnesium: 1.4 mg/dL — ABNORMAL LOW (ref 1.7–2.4)

## 2020-02-14 LAB — TRIGLYCERIDES: Triglycerides: 209 mg/dL — ABNORMAL HIGH (ref <150)

## 2020-02-14 LAB — PHOSPHORUS: Phosphorus: 1.6 mg/dL — ABNORMAL LOW (ref 2.5–4.6)

## 2020-02-14 MED ORDER — DEXTROSE 5 % IV SOLN
10.0000 mg/kg | Freq: Two times a day (BID) | INTRAVENOUS | Status: DC
  Administered 2020-02-14 – 2020-02-22 (×17): 690 mg via INTRAVENOUS
  Filled 2020-02-14 (×19): qty 13.8

## 2020-02-14 MED ORDER — VANCOMYCIN HCL 1250 MG/250ML IV SOLN
1250.0000 mg | INTRAVENOUS | Status: AC
  Administered 2020-02-14: 1250 mg via INTRAVENOUS
  Filled 2020-02-14: qty 250

## 2020-02-14 MED ORDER — VITAL AF 1.2 CAL PO LIQD
1000.0000 mL | ORAL | Status: DC
  Administered 2020-02-14: 1000 mL
  Filled 2020-02-14 (×2): qty 1000

## 2020-02-14 MED ORDER — POTASSIUM PHOSPHATES 15 MMOLE/5ML IV SOLN
45.0000 mmol | Freq: Once | INTRAVENOUS | Status: AC
  Administered 2020-02-14: 45 mmol via INTRAVENOUS
  Filled 2020-02-14: qty 15

## 2020-02-14 MED ORDER — PROSOURCE TF PO LIQD
45.0000 mL | Freq: Every day | ORAL | Status: DC
  Administered 2020-02-15 – 2020-02-18 (×3): 45 mL
  Filled 2020-02-14 (×4): qty 45

## 2020-02-14 MED ORDER — GADOBUTROL 1 MMOL/ML IV SOLN
6.5000 mL | Freq: Once | INTRAVENOUS | Status: AC | PRN
  Administered 2020-02-14: 6.5 mL via INTRAVENOUS

## 2020-02-14 MED ORDER — VANCOMYCIN HCL 500 MG/100ML IV SOLN
500.0000 mg | Freq: Two times a day (BID) | INTRAVENOUS | Status: AC
  Administered 2020-02-15 – 2020-02-17 (×6): 500 mg via INTRAVENOUS
  Filled 2020-02-14 (×6): qty 100

## 2020-02-14 MED ORDER — DEXAMETHASONE SODIUM PHOSPHATE 10 MG/ML IJ SOLN
10.0000 mg | Freq: Four times a day (QID) | INTRAMUSCULAR | Status: AC
  Administered 2020-02-14 – 2020-02-18 (×16): 10 mg via INTRAVENOUS
  Filled 2020-02-14 (×16): qty 1

## 2020-02-14 MED ORDER — SODIUM CHLORIDE 0.9 % IV SOLN
2.0000 g | Freq: Two times a day (BID) | INTRAVENOUS | Status: DC
  Administered 2020-02-14 – 2020-02-20 (×13): 2 g via INTRAVENOUS
  Filled 2020-02-14 (×2): qty 2
  Filled 2020-02-14: qty 20
  Filled 2020-02-14 (×2): qty 2
  Filled 2020-02-14 (×2): qty 20
  Filled 2020-02-14 (×4): qty 2
  Filled 2020-02-14 (×2): qty 20
  Filled 2020-02-14: qty 2
  Filled 2020-02-14: qty 20

## 2020-02-14 MED ORDER — POTASSIUM PHOSPHATES 15 MMOLE/5ML IV SOLN
30.0000 mmol | Freq: Once | INTRAVENOUS | Status: DC
  Filled 2020-02-14: qty 10

## 2020-02-14 MED ORDER — MAGNESIUM SULFATE 4 GM/100ML IV SOLN
4.0000 g | Freq: Once | INTRAVENOUS | Status: AC
  Administered 2020-02-14: 4 g via INTRAVENOUS
  Filled 2020-02-14: qty 100

## 2020-02-14 MED ORDER — LACTATED RINGERS IV BOLUS
500.0000 mL | Freq: Once | INTRAVENOUS | Status: AC
  Administered 2020-02-14: 500 mL via INTRAVENOUS

## 2020-02-14 MED ORDER — MUPIROCIN 2 % EX OINT
1.0000 "application " | TOPICAL_OINTMENT | Freq: Two times a day (BID) | CUTANEOUS | Status: AC
  Administered 2020-02-14 – 2020-02-18 (×9): 1 via NASAL
  Filled 2020-02-14 (×3): qty 22

## 2020-02-14 MED ORDER — CHLORHEXIDINE GLUCONATE CLOTH 2 % EX PADS: 6.0000 | MEDICATED_PAD | Freq: Every day | CUTANEOUS | Status: DC

## 2020-02-14 MED ORDER — SODIUM CHLORIDE 0.9 % IV SOLN
2.0000 g | Freq: Four times a day (QID) | INTRAVENOUS | Status: DC
  Administered 2020-02-14 – 2020-02-18 (×16): 2 g via INTRAVENOUS
  Filled 2020-02-14 (×2): qty 2
  Filled 2020-02-14: qty 2000
  Filled 2020-02-14: qty 2
  Filled 2020-02-14: qty 2000
  Filled 2020-02-14 (×2): qty 2
  Filled 2020-02-14: qty 2000
  Filled 2020-02-14 (×5): qty 2
  Filled 2020-02-14: qty 2000
  Filled 2020-02-14 (×3): qty 2
  Filled 2020-02-14: qty 2000

## 2020-02-14 MED ORDER — ALBUTEROL SULFATE (2.5 MG/3ML) 0.083% IN NEBU: 2.5000 mg | INHALATION_SOLUTION | RESPIRATORY_TRACT | Status: DC | PRN

## 2020-02-14 NOTE — Progress Notes (Signed)
Pharmacy Antibiotic Note  Howard Allen is a 74 y.o. male admitted on 02/13/2020 with seizures and now with concern for meningitis. Pharmacy has been consulted for Vancomycin dosing along with Acyclovir + Ampicillin + Rocephin per MD.   The patient is noted to have resolving AKI (baseline unk) - SCr down to 1.44, CrCl~40-50 ml/min. Will monitor closely with addition of Vancomycin + Acyclovir.   Plan: - Vancomycin 1250 mg IV x 1 followed by 500 mg IV every 12 hours - Will continue to follow renal function, culture results, LOT, and antibiotic de-escalation plans   Height: 5\' 10"  (177.8 cm) Weight: 68.9 kg (151 lb 14.4 oz) IBW/kg (Calculated) : 73  Temp (24hrs), Avg:98.8 F (37.1 C), Min:97.4 F (36.3 C), Max:100.5 F (38.1 C)  Recent Labs  Lab 02/13/20 1617 02/13/20 1926 02/13/20 1930 02/13/20 1935 02/14/20 0418  WBC 17.3*  --   --   --  9.0  CREATININE 1.91*  --  1.71*  --  1.44*  LATICACIDVEN  --  1.9  --  2.2*  --     Estimated Creatinine Clearance: 45.2 mL/min (A) (by C-G formula based on SCr of 1.44 mg/dL (H)).    Allergies  Allergen Reactions  . Lisinopril Anaphylaxis, Cough and Other (See Comments)    Other reaction(s): anaphylaxis Pt states it made him "cough his head off"   . Lorazepam     Other reaction(s): Hallucinations    Antimicrobials this admission: Vanc 10/7 >> Ampicillin 10/7 >> Acyclovir 10/7 >> Rocephin 10/7 >>  Dose adjustments this admission: n/a  Microbiology results: 10/6 Fluvid >> neg 10/7 MRSA PCR >> positive 10/7 CSF cx >>  Thank you for allowing pharmacy to be a part of this patient's care.  Alycia Rossetti, PharmD, BCPS Clinical Pharmacist Clinical phone for 02/14/2020: [phone] 02/14/2020 1:55 PM   **Pharmacist phone directory can now be found on [email or web address] ([email or web address]).  Listed under [place].

## 2020-02-14 NOTE — Progress Notes (Addendum)
Subjective: No clinical seizures since arrival.  Able to follow some commands this morning.  LP consistent with meningitis.  ROS: Unable to obtain due to poor mental status  Examination  Vital signs in last 24 hours: Temp:  [97.4 F (36.3 C)-100.5 F (38.1 C)] 97.9 F (36.6 C) (10/07 0800) Pulse Rate:  [58-146] 82 (10/07 0812) Resp:  [0-27] 27 (10/07 0812) BP: (85-172)/(56-111) 119/76 (10/07 0812) SpO2:  [85 %-100 %] 96 % (10/07 0812) FiO2 (%):  [40 %-100 %] 40 % (10/07 0812) Weight:  [68.9 kg] 68.9 kg (10/06 1715)  General: lying in bed, not in apparent distress CVS: pulse-normal rate and rhythm RS: breathing comfortably, coarse breath sounds bilaterally Extremities: normal, warm Neuro: Awake, follows simple one-step commands like wiggling his toes squeezing hands, pupils equally round and reactive to light, gag reflex intact, moving all 4 extremities spontaneously with antigravity strength  Basic Metabolic Panel: Recent Labs  Lab 02/13/20 1617 02/13/20 1749 02/13/20 1930 02/14/20 0418  NA 137 137  --  138  K 2.8* 2.5*  --  3.4*  CL 96*  --   --  103  CO2 15*  --   --  21*  GLUCOSE 333*  --   --  102*  BUN 19  --   --  17  CREATININE 1.91*  --  1.71* 1.44*  CALCIUM 8.9  --   --  8.7*  MG  --   --  1.0* 1.4*  PHOS  --   --  1.5* 1.6*    CBC: Recent Labs  Lab 02/13/20 1617 02/13/20 1749 02/14/20 0418  WBC 17.3*  --  9.0  NEUTROABS 14.4*  --   --   HGB 10.9* 9.2* 9.1*  HCT 36.3* 27.0* 27.4*  MCV 95.8  --  87.3  PLT 461*  --  281     Coagulation Studies: No results for input(s): LABPROT, INR in the last 72 hours.  Imaging CT head without contrast 02/13/2020: No acute intracranial abnormality. Age related volume loss and chronic small vessel ischemia.   ASSESSMENT AND PLAN: 74 year old male presented with new onset status epilepticus.   New onset status epilepticus (resolved) Meningitis Hypomagnesemia  (resolved) Hypophosphatemia Hypertriglyceridemia Hypokalemia CKD Hyperglycemia Iron deficiency anemia -LP showed 140 WBCs with 64% neutrophils and 32% lymphocytes, protein 112 and glucose less than 20 consistent with meningitis  Recommendations -On antibiotics and acyclovir per critical care team -Routine EEG to assess for any seizures. -Continue Keppra 1000 mg twice daily - MRI brain with and without contrast ordered and pending -Continue seizure precautions next-as needed IV Ativan 2 mg for clinical seizure-like activity  CRITICAL CARE Performed by: Lora Havens   Total critical care time: 35 minutes  Critical care time was exclusive of separately billable procedures and treating other patients.  Critical care was necessary to treat or prevent imminent or life-threatening deterioration.  Critical care was time spent personally by me on the following activities: development of treatment plan with patient and/or surrogate as well as nursing, discussions with consultants, evaluation of patient's response to treatment, examination of patient, obtaining history from patient or surrogate, ordering and performing treatments and interventions, ordering and review of laboratory studies, ordering and review of radiographic studies, pulse oximetry and re-evaluation of patient's condition.   Zeb Comfort Epilepsy [facility] For questions after 5pm please refer to AMION to reach the Neurologist on call

## 2020-02-14 NOTE — Procedures (Signed)
Patient Name: Howard Allen  MRN: [id number]  Epilepsy Attending: Lora Havens  Referring Physician/Provider: Dr Zeb Comfort Date: 02/14/2020 Duration: 24.28 mins  Patient history: 74yo M with new onset status epilepticus, EEG to evaluate for seizure.  Level of alertness:  comatose  AEDs during EEG study: LEV  Technical aspects: This EEG study was done with scalp electrodes positioned according to the 10-20 International system of electrode placement. Electrical activity was acquired at a sampling rate of 500Hz  and reviewed with a high frequency filter of 70Hz  and a low frequency filter of 1Hz . EEG data were recorded continuously and digitally stored.   Description: EEG showed continuous generalized 3 to 6 Hz theta-delta slowing admixed with intermittent maximal frontal 15-18 Hz beta activity.  Hyperventilation and photic stimulation were not performed.     ABNORMALITY -Continuous slow, generalized  IMPRESSION: This study is suggestive of severe diffuse encephalopathy, nonspecific etiology but likely related to sedation. No seizures or epileptiform discharges were seen throughout the recording.  EEG is similar to previous day.  Draxton Luu Barbra Sarks

## 2020-02-14 NOTE — Progress Notes (Signed)
RT transported pt from 4N31 to MRI while on ventilator, tolerated well with SVS.

## 2020-02-14 NOTE — Progress Notes (Signed)
NAME:  Howard Allen, MRN:  [id number], DOB:  05-Sep-1945, LOS: 1 ADMISSION DATE:  02/13/2020, CONSULTATION DATE:  02/13/2020 REFERRING MD:  Cheral Marker, CHIEF COMPLAINT:  Seizure   Brief History   74 y.o. 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  CT head negative. PCCM consulted for admission.  History of present illness   Howard Allen is a 74 y.o. M with PMH of  Crohn's Disease, HTN, HL, Depressio, hospitalization in 10/2019 for pneumatosis s/p ileocolic resection and lysis of adhesions who has had mulitple recent seizures and was treated at outside hospitals. Pt was brought in by EMS for at least three witnessed generalized tonic-clonic seizures today.  He presented unresponsive and was intubated on arrival.   Past Medical History  Crohn's Disease and SBO, HTN, HL, Depression  Significant Hospital Events   10/6 >> Admitted to [facility]  Consults:  Neurology  Procedures:  10/6 >> ETT placed  Significant Diagnostic Tests:  10/6 CT Head >> No acute intracranial abnormalities; age-related volume loss and chronic small vessel disease  Micro Data:  10/6 COVID-19 + influenza >> Negative  10/6 Blood Cultures >> Pending 10/7 CSF Culture >> Pending   Antimicrobials:  N/A  Interim history/subjective:   No overnight events. Patient opens eyes to verbal but not able to answer questions. Off sedation at this time.   Discussed with daughter Threasa Beards. She states they were leaving on vacation from [place] on Saturday the 2nd, when patient had a seizure in the car. They took him to [facility], [place], [facility], where he was cleared. During the past 4 days, patient seemed to be uneasy with frequent falls, with onset of both auditory and visual hallucinations. Gait has been shuffling. He has also been sluring his words with rapid tangential speech. Yesterday, they were on the  way back home when patient endorsed olfactory hallucinations and became seizing 1 hour later. She denies any illness, including fever or chills prior to onset of initial seizure.   Threasa Beards notes that over the past several months, patient has not been eating and lost approximately 60 lbs in the past 9 months, with the last 10 lbs this month. He has been having tremors but that they mostly occur when patient is not eating. Per chart review, patient also experienced tremors with Megace. Cause of weight loss and lack of appetite has not been diagnosed. Patient is up to date with age appropriate cancer screening. He does have a hx of depression and anxiety though that have been worse this year. Depression worsened after finishing physical therapy after surgery.   He was also diagnosed with Crohn's disease this year that was complicated by SBO. After resection, patient was supposedly told the Crohn's diagnosis was incorrect; SBO was from adhesions. Patient was taken off Humira and Prednisone; he has not been taking it in a while.   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   Objective   Blood pressure 128/83, pulse 63, temperature 98.5 F (36.9 C), resp. rate 16, height 5\' 10"  (1.778 m), weight 68.9 kg, SpO2 99 %.    Vent Mode: PRVC FiO2 (%):  [40 %-100 %] 40 % Set Rate:  [16 bmp] 16 bmp Vt Set:  [580 mL] 580 mL PEEP:  [5 cmH20] 5 cmH20 Plateau Pressure:  [14 cmH20-16 cmH20] 15 cmH20   Intake/Output Summary (Last 24 hours) at 02/14/2020 0725 Last data filed  at 02/14/2020 0600 Gross per 24 hour  Intake 1420.06 ml  Output 130 ml  Net 1290.06 ml   Filed Weights   02/13/20 1715  Weight: 68.9 kg   Physical Exam Vitals and nursing note reviewed.  Constitutional:      Appearance: He is underweight. He is not diaphoretic.  Cardiovascular:     Rate and Rhythm: Normal rate and regular rhythm.     Heart sounds: No murmur heard.   Pulmonary:     Effort: Pulmonary effort is  normal. No respiratory distress.     Breath sounds: No wheezing or rales.  Abdominal:     General: Bowel sounds are normal. There is no distension.     Palpations: Abdomen is soft.     Tenderness: There is no abdominal tenderness. There is no guarding.  Musculoskeletal:     Right lower leg: No edema.     Left lower leg: No edema.  Neurological:     Mental Status: He is lethargic.     Cranial Nerves: No facial asymmetry.     Comments: Able to follow commands intermittently. Moves all extremities spontaneously.       Resolved Hospital Problem list   AKI  Assessment & Plan:   # Tonic-Clonic Seizures # Status Epilepticus  - Neurology on board; appreciate their recommendations - Etiology undetermined at this time. Patient had significant metabolic abnormalities on presentation including magnesium of 1.0 that may have led to seizure. Infectious is possible as well; LP performed with results pending - Discussed with Dr. Hortense Ramal; plan to pursue MRI and repeat EEG off sedation - Continue Keppra at this time and PRN Ativan in case of recurrent seizure - Magnesium is being replenished  # Acute Respiratory Failure  - Secondary to acute encephalopathy 2/2 to status epilepticus - Patient is doing well on PSV this morning, will consider extubation given mental status improving  # Hypokalemia  # Hypomagnesemia  # Hypophosphoremia - Likely due to malnutrition and anorexia, possibly worsened by malabsorption given bowel inflammation seen on imaging the past several months prior to resection.  - Monitor K, Mg, and P daily,  - Replenish PRN  # HTN # HLD - Home medications include Toprol 100 mg, Amlodipine 2.5 mg, Lipitor 20 mg, Fenofibrate 160 mg  - Holding anti-HTNs at this time given hypotension on admission - Plan to resume today or tomorrow as patient is off pressors and BP improving.   # ? Crohn's Disease s/p Ileocolic Resection  - No longer on immunosuppressive therapy.   # Acute  Kidney Injury: Resolved  - Creatinine improved significantly today - Continue to monitor   # Hx of Iron Deficiency Anemia  - Hemoglobin stable compared to results from July 2021.   Best practice:  Diet: NPO Pain/Anxiety/Delirium protocol (if indicated): Propofol PRN, stopped this AM VAP protocol (if indicated): Per Protocol DVT prophylaxis: Heparin GI prophylaxis: Protonix Glucose control: N/A Mobility: Bedrest Code Status: FULL Family Communication: Daughter updated via telephone  Disposition: ICU  Labs   CBC: Recent Labs  Lab 02/13/20 1617 02/13/20 1749 02/14/20 0418  WBC 17.3*  --  9.0  NEUTROABS 14.4*  --   --   HGB 10.9* 9.2* 9.1*  HCT 36.3* 27.0* 27.4*  MCV 95.8  --  87.3  PLT 461*  --  237    Basic Metabolic Panel: Recent Labs  Lab 02/13/20 1617 02/13/20 1749 02/13/20 1930 02/14/20 0418  NA 137 137  --  138  K 2.8* 2.5*  --  3.4*  CL 96*  --   --  103  CO2 15*  --   --  21*  GLUCOSE 333*  --   --  102*  BUN 19  --   --  17  CREATININE 1.91*  --  1.71* 1.44*  CALCIUM 8.9  --   --  8.7*  MG  --   --  1.0* 1.4*  PHOS  --   --  1.5* 1.6*   GFR: Estimated Creatinine Clearance: 45.2 mL/min (A) (by C-G formula based on SCr of 1.44 mg/dL (H)). Recent Labs  Lab 02/13/20 1617 02/13/20 1926 02/13/20 1935 02/14/20 0418  WBC 17.3*  --   --  9.0  LATICACIDVEN  --  1.9 2.2*  --     Liver Function Tests: Recent Labs  Lab 02/13/20 1617  AST 32  ALT 17  ALKPHOS 31*  BILITOT 0.5  PROT 6.7  ALBUMIN 3.7   No results for input(s): LIPASE, AMYLASE in the last 168 hours. No results for input(s): AMMONIA in the last 168 hours.  ABG    Component Value Date/Time   PHART 7.502 (H) 02/14/2020 0335   PCO2ART 30.5 (L) 02/14/2020 0335   PO2ART 140 (H) 02/14/2020 0335   HCO3 23.7 02/14/2020 0335   TCO2 25 02/13/2020 1749   ACIDBASEDEF 2.0 02/13/2020 1749   O2SAT 99.3 02/14/2020 0335     Coagulation Profile: No results for input(s): INR, PROTIME in the  last 168 hours.  Cardiac Enzymes: Recent Labs  Lab 02/13/20 1930  CKTOTAL 128    HbA1C: Hgb A1c MFr Bld  Date/Time Value Ref Range Status  02/13/2020 07:30 PM 5.3 4.8 - 5.6 % Final    Comment:    (NOTE) Pre diabetes:          5.7%-6.4%  Diabetes:              >6.4%  Glycemic control for   <7.0% adults with diabetes     CBG: Recent Labs  Lab 02/13/20 1942 02/13/20 2351 02/14/20 0341  GLUCAP 132* 110* 104*    Review of Systems:   Negative except as noted above  Past Medical History  He,  has no past medical history on file.   Surgical History     Social History      Family History   His family history is not on file.   Allergies Allergies  Allergen Reactions  . Lisinopril Anaphylaxis, Cough and Other (See Comments)    Other reaction(s): anaphylaxis Pt states it made him "cough his head off"   . Lorazepam     Other reaction(s): Hallucinations     Home Medications  Prior to Admission medications   Medication Sig Start Date End Date Taking? Authorizing Provider  albuterol (VENTOLIN HFA) 108 (90 Base) MCG/ACT inhaler Inhale 2 puffs into the lungs every 6 (six) hours as needed for wheezing or shortness of breath.  08/27/19  Yes [provider]  amLODipine (NORVASC) 2.5 MG tablet Take 2.5 mg by mouth daily. 01/30/20  Yes [provider]  atorvastatin (LIPITOR) 20 MG tablet Take 20 mg by mouth daily.  01/30/20  Yes [provider]  busPIRone (BUSPAR) 15 MG tablet Take 15 mg by mouth 2 (two) times daily. 01/30/20  Yes [provider]  cyproheptadine (PERIACTIN) 4 MG tablet Take 4 mg by mouth 2 (two) times daily. 01/30/20  Yes [provider]  fenofibrate 160 MG tablet Take 160 mg by mouth daily. 01/30/20  Yes  [provider]  metoprolol succinate (TOPROL-XL) 100 MG 24 hr tablet Take 100 mg by mouth daily. 01/30/20  Yes [provider]  pantoprazole (PROTONIX) 20 MG tablet Take 20 mg by mouth daily.  01/30/20  Yes [provider]  budesonide (ENTOCORT EC) 3 MG 24 hr capsule Take 3 mg by mouth daily.    [provider]  doxazosin (CARDURA) 1 MG tablet Take 1 mg by mouth daily. 08/27/19   [provider]  hydrochlorothiazide (HYDRODIURIL) 25 MG tablet Take 25 mg by mouth daily. 08/28/19   [provider]  meclizine (ANTIVERT) 25 MG tablet Take 25 mg by mouth 2 (two) times daily as needed for dizziness.  10/12/19   [provider]     Dr. Jose Persia Internal Medicine PGY-2  Pager: [phone] After 5pm on weekdays and 1pm on weekends: On Call pager [phone]  02/14/2020, 7:25 AM

## 2020-02-14 NOTE — Progress Notes (Signed)
EEG complete - results pending 

## 2020-02-14 NOTE — Procedures (Signed)
Lumbar Puncture Procedure Note  Savino Whisenant  [id number]  03/09/1946  Date:02/14/20  Time:11:51 AM   Provider Performing:Terran Klinke   Procedure: Lumbar Puncture (28366)  Indication(s) Rule out meningitis  Consent Risks of the procedure as well as the alternatives and risks of each were explained to the patient and/or caregiver.  Consent for the procedure was obtained and is signed in the bedside chart  Anesthesia Topical only with 1% lidocaine    Time Out Verified patient identification, verified procedure, site/side was marked, verified correct patient position, special equipment/implants available, medications/allergies/relevant history reviewed, required imaging and test results available.   Sterile Technique Maximal sterile technique including sterile barrier drape, hand hygiene, sterile gown, sterile gloves, mask, hair covering.    Procedure Description Using palpation, approximate location of L3-L4 space identified.   Lidocaine used to anesthetize skin and subcutaneous tissue overlying this area.  A 20g spinal needle was then used to access the subarachnoid space. Opening pressure:13cm H2O. Closing pressure:7cm H2O. 15 cc CSF obtained.  Complications/Tolerance None; patient tolerated the procedure well.   EBL Minimal   Specimen(s) CSF

## 2020-02-14 NOTE — Progress Notes (Signed)
Initial Nutrition Assessment  DOCUMENTATION CODES:   Not applicable  INTERVENTION:   Tube Feeding via OG: Vital AF 1.2 at 60 ml/hr Pro-Source 45 mL daily Provides 119 g of protein, 1768 kcals and 1166 mL of free water  NUTRITION DIAGNOSIS:   Moderate Malnutrition related to   as evidenced by mild fat depletion, moderate fat depletion, mild muscle depletion, moderate muscle depletion.  GOAL:   Patient will meet greater than or equal to 90% of their needs  MONITOR:   Vent status, Labs, Weight trends, TF tolerance  REASON FOR ASSESSMENT:   Consult, Ventilator Enteral/tube feeding initiation and management  ASSESSMENT:   74 yo male presents with 1 week of falls, hallucinations and admitted with recurrent seizures and status epilepticus requiring intubation. PMH includes hx of Crohn's, prior SBO, HTN, HLD, depression   10/06 Admitted, Intubated, Adult TF protocol initiated 10/07 LP performed  Noted MRI brain pending Patient is currently intubated on ventilator support MV: 9.6 L/min Temp (24hrs), Avg:98.8 F (37.1 C), Min:97.4 F (36.3 C), Max:100.5 F (38.1 C)  OG tube in place, Vital High Protein at 40 ml/hr  Potassium, phosphorus and magnesium are low; Electrolytes being replaced  Unable to obtain diet and weight history from patient. Current wt 68.9 kg; no previous weight encounters.   Labs: potassium 3.4 (L), Creatinine 1.44, phosphorus 1.6 (L), magnesium 1.4 (L) Meds: colace, ss novolog, potassium phosphate, KCl, mag sulfate   NUTRITION - FOCUSED PHYSICAL EXAM:    Most Recent Value  Orbital Region Mild depletion  Upper Arm Region Mild depletion  Thoracic and Lumbar Region Mild depletion  Buccal Region Unable to assess  Temple Region Mild depletion  Clavicle Bone Region Mild depletion  Clavicle and Acromion Bone Region Mild depletion  Scapular Bone Region Moderate depletion  Dorsal Hand Unable to assess  Patellar Region Moderate depletion  Anterior  Thigh Region Moderate depletion  Posterior Calf Region Moderate depletion  Nails Unable to assess       Diet Order:   Diet Order            Diet NPO time specified  Diet effective now                 EDUCATION NEEDS:   Not appropriate for education at this time  Skin:  Skin Assessment: Reviewed RN Assessment  Last BM:  10/7  Height:   Ht Readings from Last 1 Encounters:  02/13/20 5\' 10"  (1.778 m)    Weight:   Wt Readings from Last 1 Encounters:  02/13/20 68.9 kg     BMI:  Body mass index is 21.79 kg/m.  Estimated Nutritional Needs:   Kcal:  1840 kcals  Protein:  100-125 g  Fluid:  >/= 1.8 L    Kerman Passey MS, RDN, LDN, CNSC Registered Dietitian III Clinical Nutrition RD Pager and On-Call Pager Number Located in [place]

## 2020-02-15 ENCOUNTER — Inpatient Hospital Stay (HOSPITAL_COMMUNITY): Payer: Medicare Other

## 2020-02-15 DIAGNOSIS — J9600 Acute respiratory failure, unspecified whether with hypoxia or hypercapnia: Secondary | ICD-10-CM

## 2020-02-15 DIAGNOSIS — R569 Unspecified convulsions: Secondary | ICD-10-CM | POA: Diagnosis not present

## 2020-02-15 LAB — CBC
HCT: 24.4 % — ABNORMAL LOW (ref 39.0–52.0)
Hemoglobin: 8.2 g/dL — ABNORMAL LOW (ref 13.0–17.0)
MCH: 28.9 pg (ref 26.0–34.0)
MCHC: 33.6 g/dL (ref 30.0–36.0)
MCV: 85.9 fL (ref 80.0–100.0)
Platelets: 256 10*3/uL (ref 150–400)
RBC: 2.84 MIL/uL — ABNORMAL LOW (ref 4.22–5.81)
RDW: 15.8 % — ABNORMAL HIGH (ref 11.5–15.5)
WBC: 6.2 10*3/uL (ref 4.0–10.5)
nRBC: 0 % (ref 0.0–0.2)

## 2020-02-15 LAB — COMPREHENSIVE METABOLIC PANEL
ALT: 14 U/L (ref 0–44)
AST: 20 U/L (ref 15–41)
Albumin: 2.5 g/dL — ABNORMAL LOW (ref 3.5–5.0)
Alkaline Phosphatase: 21 U/L — ABNORMAL LOW (ref 38–126)
Anion gap: 12 (ref 5–15)
BUN: 14 mg/dL (ref 8–23)
CO2: 21 mmol/L — ABNORMAL LOW (ref 22–32)
Calcium: 8.3 mg/dL — ABNORMAL LOW (ref 8.9–10.3)
Chloride: 102 mmol/L (ref 98–111)
Creatinine, Ser: 1.19 mg/dL (ref 0.61–1.24)
GFR calc non Af Amer: >60 mL/min (ref >60)
Glucose, Bld: 164 mg/dL — ABNORMAL HIGH (ref 70–99)
Potassium: 3.3 mmol/L — ABNORMAL LOW (ref 3.5–5.1)
Sodium: 135 mmol/L (ref 135–145)
Total Bilirubin: 0.5 mg/dL (ref 0.3–1.2)
Total Protein: 5.1 g/dL — ABNORMAL LOW (ref 6.5–8.1)

## 2020-02-15 LAB — ECHOCARDIOGRAM COMPLETE BUBBLE STUDY
AR max vel: 2.21 cm2
AV Area VTI: 2.37 cm2
AV Area mean vel: 2 cm2
AV Mean grad: 4 mmHg
AV Peak grad: 6.9 mmHg
Ao pk vel: 1.31 m/s
Area-P 1/2: 2.8 cm2
S' Lateral: 2.2 cm

## 2020-02-15 LAB — TRIGLYCERIDES: Triglycerides: 210 mg/dL — ABNORMAL HIGH (ref <150)

## 2020-02-15 LAB — GLUCOSE, CAPILLARY
Glucose-Capillary: 113 mg/dL — ABNORMAL HIGH (ref 70–99)
Glucose-Capillary: 121 mg/dL — ABNORMAL HIGH (ref 70–99)
Glucose-Capillary: 124 mg/dL — ABNORMAL HIGH (ref 70–99)
Glucose-Capillary: 142 mg/dL — ABNORMAL HIGH (ref 70–99)
Glucose-Capillary: 144 mg/dL — ABNORMAL HIGH (ref 70–99)
Glucose-Capillary: 156 mg/dL — ABNORMAL HIGH (ref 70–99)

## 2020-02-15 LAB — CRYPTOCOCCAL ANTIGEN, CSF: Crypto Ag: NEGATIVE

## 2020-02-15 LAB — PHOSPHORUS: Phosphorus: 3.1 mg/dL (ref 2.5–4.6)

## 2020-02-15 LAB — PROTEIN AND GLUCOSE, CSF
Glucose, CSF: <20 mg/dL — CL (ref 40–70)
Total  Protein, CSF: 112 mg/dL — ABNORMAL HIGH (ref 15–45)

## 2020-02-15 LAB — MAGNESIUM: Magnesium: 1.8 mg/dL (ref 1.7–2.4)

## 2020-02-15 MED ORDER — CLOPIDOGREL BISULFATE 75 MG PO TABS
75.0000 mg | ORAL_TABLET | Freq: Every day | ORAL | Status: DC
  Administered 2020-02-15: 75 mg
  Filled 2020-02-15: qty 1

## 2020-02-15 MED ORDER — ASPIRIN 81 MG PO CHEW
81.0000 mg | CHEWABLE_TABLET | Freq: Every day | ORAL | Status: DC
  Administered 2020-02-15: 81 mg
  Filled 2020-02-15: qty 1

## 2020-02-15 MED ORDER — ATORVASTATIN CALCIUM 80 MG PO TABS
80.0000 mg | ORAL_TABLET | Freq: Every day | ORAL | Status: DC
  Administered 2020-02-15: 80 mg
  Filled 2020-02-15: qty 1

## 2020-02-15 MED ORDER — POTASSIUM CHLORIDE 20 MEQ/15ML (10%) PO SOLN: 40.0000 meq | Freq: Once | ORAL | Status: DC

## 2020-02-15 MED ORDER — POTASSIUM CHLORIDE 20 MEQ/15ML (10%) PO SOLN
40.0000 meq | Freq: Every day | ORAL | Status: DC
  Administered 2020-02-15: 40 meq
  Filled 2020-02-15: qty 30

## 2020-02-15 NOTE — Procedures (Signed)
Extubation Procedure Note  Patient Details:   Name: Howard Allen DOB: 06/23/1945 MRN: [id number]   Airway Documentation:    Vent end date: 02/15/20 Vent end time: 1450   Evaluation  O2 sats: stable throughout Complications: No apparent complications Patient did tolerate procedure well. Bilateral Breath Sounds: Clear, Diminished   Yes   Prior to extubation pt did have a positive cuff leak. Pt was extubated to 4LNC tolerated well. Pt was able to state his name.  Jorje Guild 02/15/2020, 2:52 PM

## 2020-02-15 NOTE — Progress Notes (Signed)
  Echocardiogram 2D Echocardiogram has been performed.  Howard Allen 02/15/2020, 11:02 AM

## 2020-02-15 NOTE — Progress Notes (Signed)
Subjective: No acute events overnight.  Patient's daughter at bedside today.  ROS: Unable to obtain due to poor mental status  Examination  Vital signs in last 24 hours: Temp:  [97.4 F (36.3 C)-97.8 F (36.6 C)] 97.8 F (36.6 C) (10/08 1200) Pulse Rate:  [55-78] 76 (10/08 1200) Resp:  [16-26] 18 (10/08 1200) BP: (98-130)/(63-82) 121/70 (10/08 1200) SpO2:  [100 %] 100 % (10/08 1200) FiO2 (%):  [40 %] 40 % (10/08 0824) Weight:  [59.5 kg] 59.5 kg (10/08 0500)  General: lying in bed, not in apparent distress CVS: pulse-normal rate and rhythm RS: breathing comfortably, coarse breath sounds bilaterally Extremities: normal, warm Neuro: Awake, follows simple one-step commands like raising his arm but not consistently, pupils equally round and reactive to light, gag reflex intact, moving all 4 extremities spontaneously with antigravity strength  Basic Metabolic Panel: Recent Labs  Lab 02/13/20 1617 02/13/20 1749 02/13/20 1930 02/14/20 0418 02/15/20 0322  NA 137 137  --  138 135  K 2.8* 2.5*  --  3.4* 3.3*  CL 96*  --   --  103 102  CO2 15*  --   --  21* 21*  GLUCOSE 333*  --   --  102* 164*  BUN 19  --   --  17 14  CREATININE 1.91*  --  1.71* 1.44* 1.19  CALCIUM 8.9  --   --  8.7* 8.3*  MG  --   --  1.0* 1.4* 1.8  PHOS  --   --  1.5* 1.6* 3.1    CBC: Recent Labs  Lab 02/13/20 1617 02/13/20 1749 02/14/20 0418 02/15/20 0322  WBC 17.3*  --  9.0 6.2  NEUTROABS 14.4*  --   --   --   HGB 10.9* 9.2* 9.1* 8.2*  HCT 36.3* 27.0* 27.4* 24.4*  MCV 95.8  --  87.3 85.9  PLT 461*  --  281 256     Coagulation Studies: No results for input(s): LABPROT, INR in the last 72 hours.  Imaging MRI brain with and without contrast 02/14/2020: Signal abnormality in the left thalamus most consistent with an acute infarct.  Signal abnormality in the mesial left temporal lobe and splenium of the corpus callosum which may reflect acute infarct, sequelae of recent seizure activity, or  encephalitis (including herpes encephalitis). No contralateral cerebral involvement.       ASSESSMENT AND PLAN: 74 year old male presented with new onset status epilepticus.   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 Cheral Marker, the MRI changes are most likely related to patient's seizure, less likely that this is an acute infarct given atypical location as well as patient's clinical presentation. -No TPA administered as patient presented with seizures and no last known normal.  Recommendations -On antibiotics and acyclovir per critical care team -Continue Keppra 1000 mg twice daily -Started on aspirin and Plavix by critical care team as well as TTE ordered by critical care team -We will repeat MRI brain in 8 to 12 weeks to see if MRI changes resolve which would make it more likely that these were seizure related. -Continue seizure precautions  -as needed IV Ativan 2 mg for clinical seizure-like activity  I have spent a total of  35  minutes with the patient reviewing hospital notes,  test results, labs and examining the patient  as well as establishing an assessment and plan that was discussed personally with the patient's daughter at bedside  > 50% of time was spent in direct patient care.    Zeb Comfort Epilepsy [facility] For questions after 5pm please refer to AMION to reach the Neurologist on call

## 2020-02-15 NOTE — Progress Notes (Addendum)
NAME:  Howard Allen, MRN:  [id number], DOB:  Jan 25, 1946, LOS: 2 ADMISSION DATE:  02/13/2020, CONSULTATION DATE:  02/13/2020 REFERRING MD:  Cheral Marker, CHIEF COMPLAINT:  Seizure   Brief History   74 y.o. 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  CT head negative. PCCM consulted for admission.  History of present illness   Howard Allen is a 74 y.o. M with PMH of  Crohn's Disease, HTN, HL, Depressio, hospitalization in 10/2019 for pneumatosis s/p ileocolic resection and lysis of adhesions who has had mulitple recent seizures and was treated at outside hospitals. Pt was brought in by EMS for at least three witnessed generalized tonic-clonic seizures today.  He presented unresponsive and was intubated on arrival.   Past Medical History  Crohn's Disease and SBO, HTN, HL, Depression  Significant Hospital Events   10/6 >> Admitted to [facility]  Consults:  Neurology  Procedures:  10/6 >> ETT placed 10/7 >> LP  Significant Diagnostic Tests:  10/6 CT Head >> No acute intracranial abnormalities; age-related volume loss and chronic small vessel disease  Micro Data:  10/6 COVID-19 + influenza >> Negative  10/6 Blood Cultures >> Pending 10/7 CSF Culture >> Pending   Antimicrobials:  Vancomycin 10/7 >> Ceftriaxone 10/7 >> Ampicillin 10/7 >> Acyclovir 10/7 >>  Interim history/subjective:   No overnight events. Patient wakes to voice and stimuli this morning although still lethargic. Able to follow commands.   Objective   Blood pressure 106/69, pulse 62, temperature 97.6 F (36.4 C), temperature source Oral, resp. rate 16, height 5\' 10"  (1.778 m), weight 59.5 kg, SpO2 100 %.    Vent Mode: PRVC FiO2 (%):  [40 %] 40 % Set Rate:  [16 bmp] 16 bmp Vt Set:  [580 mL] 580 mL PEEP:  [5 cmH20] 5 cmH20 Pressure Support:  [5 cmH20] 5 cmH20 Plateau Pressure:  [13 cmH20-16 cmH20] 15  cmH20   Intake/Output Summary (Last 24 hours) at 02/15/2020 0737 Last data filed at 02/15/2020 0600 Gross per 24 hour  Intake 2812.54 ml  Output 1100 ml  Net 1712.54 ml   Filed Weights   02/13/20 1715 02/15/20 0500  Weight: 68.9 kg 59.5 kg   Physical Exam Vitals and nursing note reviewed.  Constitutional:      Appearance: He is underweight. He is not diaphoretic.  HENT:     Head: Normocephalic and atraumatic.  Cardiovascular:     Rate and Rhythm: Normal rate and regular rhythm.     Heart sounds: No murmur heard.   Pulmonary:     Effort: Pulmonary effort is normal. No respiratory distress.     Breath sounds: No wheezing or rales.  Abdominal:     General: Bowel sounds are normal. There is no distension.     Palpations: Abdomen is soft.     Tenderness: There is no abdominal tenderness. There is no guarding.  Musculoskeletal:     Right lower leg: No edema.     Left lower leg: No edema.  Skin:    General: Skin is warm and dry.     Findings: No erythema, lesion or rash.  Neurological:     General: No focal deficit present.     Mental Status: He is lethargic.     Cranial Nerves: No facial asymmetry.     Comments: Able to follow commands. Moves all extremities spontaneously.     Resolved Hospital Problem list   AKI  Assessment & Plan:   # Meningitis  - LP performed on 10/7 consistent with meningitis - Cultures are NGTD at < 24 hours; HSV and [place] serology pending - Will add on Crypto, quantiferon gold and VDLR given patient has had an indolent decline in mental status and functioning, severe weight loss and immunocompromised state  - Neurologically intact.; no focal deficit - Continue Vancomycin, Ceftriaxone, Ampicillin, Acyclovir, Decadron   # Acute Ischemic Infarct - Seen on MRI imaging on 10/7: left thalamic infarct measuring 2.3 cm; no edema or hemorrhagic conversion.  - TTE ordered and pending. No known hx of A. Fib; none seen on telemetry since admission -  Start high intensity statin - Start Aspirin and Plavix  - No anti-HTNs to allow for permissive HTN - Will follow up with Neurology for additional recommendations   # Tonic-Clonic Seizures # Status Epilepticus  - Secondary to meningitis  - Neurology on board; appreciate their recommendations - Continue Keppra at this time and PRN Ativan in case of recurrent seizure  # Acute Respiratory Failure  - Secondary to acute encephalopathy 2/2 to status epilepticus - Tolerating PSV well today and able to follow commands, will consider extubation.   # Hypokalemia  # Hypomagnesemia  # Hypophosphoremia - Likely due to malnutrition and anorexia, possibly worsened by malabsorption given bowel inflammation seen on imaging the past several months prior to resection.  - Monitor K, Mg, and P daily - Replenish PRN  # HTN # HLD - Home medications include Toprol 100 mg, Amlodipine 2.5 mg, Lipitor 20 mg, Fenofibrate 160 mg  - Holding anti-HTNs at this time to allow for permissive hypertension  # Normocytic Anemia  # Hx of Iron Deficiency Anemia  - Hemoglobin has decreased some today from yesterday. No active source of bleeding.  - Will continue to monitor and transfuse if hemoglobin < 7.0   # Acute Kidney Injury: Resolved   # ? Crohn's Disease s/p Ileocolic Resection  - No longer on immunosuppressive therapy.   Best practice:  Diet: NPO Pain/Anxiety/Delirium protocol (if indicated): Propofol PRN  VAP protocol (if indicated): Per Protocol DVT prophylaxis: Heparin GI prophylaxis: Protonix Glucose control: N/A Mobility: Bedrest Code Status: FULL Family Communication: Daughter updated at bedside  Disposition: ICU  Labs   CBC: Recent Labs  Lab 02/13/20 1617 02/13/20 1749 02/14/20 0418 02/15/20 0322  WBC 17.3*  --  9.0 6.2  NEUTROABS 14.4*  --   --   --   HGB 10.9* 9.2* 9.1* 8.2*  HCT 36.3* 27.0* 27.4* 24.4*  MCV 95.8  --  87.3 85.9  PLT 461*  --  281 062    Basic Metabolic  Panel: Recent Labs  Lab 02/13/20 1617 02/13/20 1749 02/13/20 1930 02/14/20 0418 02/15/20 0322  NA 137 137  --  138 135  K 2.8* 2.5*  --  3.4* 3.3*  CL 96*  --   --  103 102  CO2 15*  --   --  21* 21*  GLUCOSE 333*  --   --  102* 164*  BUN 19  --   --  17 14  CREATININE 1.91*  --  1.71* 1.44* 1.19  CALCIUM 8.9  --   --  8.7* 8.3*  MG  --   --  1.0* 1.4* 1.8  PHOS  --   --  1.5* 1.6* 3.1   GFR: Estimated Creatinine Clearance: 47.2 mL/min (by C-G formula based on SCr of 1.19 mg/dL). Recent Labs  Lab 02/13/20 1617 02/13/20  1926 02/13/20 1935 02/14/20 0418 02/15/20 0322  WBC 17.3*  --   --  9.0 6.2  LATICACIDVEN  --  1.9 2.2*  --   --     Liver Function Tests: Recent Labs  Lab 02/13/20 1617 02/15/20 0322  AST 32 20  ALT 17 14  ALKPHOS 31* 21*  BILITOT 0.5 0.5  PROT 6.7 5.1*  ALBUMIN 3.7 2.5*   No results for input(s): LIPASE, AMYLASE in the last 168 hours. No results for input(s): AMMONIA in the last 168 hours.  ABG    Component Value Date/Time   PHART 7.502 (H) 02/14/2020 0335   PCO2ART 30.5 (L) 02/14/2020 0335   PO2ART 140 (H) 02/14/2020 0335   HCO3 23.7 02/14/2020 0335   TCO2 25 02/13/2020 1749   ACIDBASEDEF 2.0 02/13/2020 1749   O2SAT 99.3 02/14/2020 0335     Coagulation Profile: No results for input(s): INR, PROTIME in the last 168 hours.  Cardiac Enzymes: Recent Labs  Lab 02/13/20 1930  CKTOTAL 128    HbA1C: Hgb A1c MFr Bld  Date/Time Value Ref Range Status  02/13/2020 07:30 PM 5.3 4.8 - 5.6 % Final    Comment:    (NOTE) Pre diabetes:          5.7%-6.4%  Diabetes:              >6.4%  Glycemic control for   <7.0% adults with diabetes     CBG: Recent Labs  Lab 02/14/20 0808 02/14/20 1220 02/14/20 1947 02/14/20 2341 02/15/20 0347  GLUCAP 103* 122* 130* 143* 144*    Review of Systems:   Negative except as noted above  Past Medical History  He,  has no past medical history on file.   Surgical History     Social  History      Family History   His family history is not on file.   Allergies Allergies  Allergen Reactions  . Lisinopril Anaphylaxis, Cough and Other (See Comments)    Other reaction(s): anaphylaxis Pt states it made him "cough his head off"   . Lorazepam     Other reaction(s): Hallucinations     Home Medications  Prior to Admission medications   Medication Sig Start Date End Date Taking? Authorizing Provider  albuterol (VENTOLIN HFA) 108 (90 Base) MCG/ACT inhaler Inhale 2 puffs into the lungs every 6 (six) hours as needed for wheezing or shortness of breath.  08/27/19  Yes [provider]  amLODipine (NORVASC) 2.5 MG tablet Take 2.5 mg by mouth daily. 01/30/20  Yes [provider]  atorvastatin (LIPITOR) 20 MG tablet Take 20 mg by mouth daily.  01/30/20  Yes [provider]  busPIRone (BUSPAR) 15 MG tablet Take 15 mg by mouth 2 (two) times daily. 01/30/20  Yes [provider]  cyproheptadine (PERIACTIN) 4 MG tablet Take 4 mg by mouth 2 (two) times daily. 01/30/20  Yes [provider]  fenofibrate 160 MG tablet Take 160 mg by mouth daily. 01/30/20  Yes [provider]  metoprolol succinate (TOPROL-XL) 100 MG 24 hr tablet Take 100 mg by mouth daily. 01/30/20  Yes [provider]  pantoprazole (PROTONIX) 20 MG tablet Take 20 mg by mouth daily. 01/30/20  Yes [provider]  budesonide (ENTOCORT EC) 3 MG 24 hr capsule Take 3 mg by mouth daily.    [provider]  doxazosin (CARDURA) 1 MG tablet Take 1 mg by mouth daily. 08/27/19   [provider]  hydrochlorothiazide (HYDRODIURIL) 25  MG tablet Take 25 mg by mouth daily. 08/28/19   [provider]  meclizine (ANTIVERT) 25 MG tablet Take 25 mg by mouth 2 (two) times daily as needed for dizziness.  10/12/19   [provider]     Dr. Jose Persia Internal Medicine PGY-2  Pager: [phone] After 5pm on weekdays and 1pm on weekends: On Call  pager [phone]  02/15/2020, 7:37 AM

## 2020-02-16 ENCOUNTER — Inpatient Hospital Stay (HOSPITAL_COMMUNITY): Payer: Medicare Other

## 2020-02-16 ENCOUNTER — Other Ambulatory Visit (HOSPITAL_COMMUNITY): Payer: Medicare Other

## 2020-02-16 DIAGNOSIS — R569 Unspecified convulsions: Secondary | ICD-10-CM | POA: Diagnosis not present

## 2020-02-16 LAB — FOLATE: Folate: 6.5 ng/mL (ref >5.9)

## 2020-02-16 LAB — POCT I-STAT 7, (LYTES, BLD GAS, ICA,H+H)
Acid-Base Excess: 0 mmol/L (ref 0.0–2.0)
Bicarbonate: 22.7 mmol/L (ref 20.0–28.0)
Calcium, Ion: 1.22 mmol/L (ref 1.15–1.40)
HCT: 25 % — ABNORMAL LOW (ref 39.0–52.0)
Hemoglobin: 8.5 g/dL — ABNORMAL LOW (ref 13.0–17.0)
O2 Saturation: 97 %
Patient temperature: 97.5
Potassium: 3.6 mmol/L (ref 3.5–5.1)
Sodium: 141 mmol/L (ref 135–145)
TCO2: 24 mmol/L (ref 22–32)
pCO2 arterial: 28.5 mmHg — ABNORMAL LOW (ref 32.0–48.0)
pH, Arterial: 7.506 — ABNORMAL HIGH (ref 7.350–7.450)
pO2, Arterial: 81 mmHg — ABNORMAL LOW (ref 83.0–108.0)

## 2020-02-16 LAB — MAGNESIUM: Magnesium: 1.7 mg/dL (ref 1.7–2.4)

## 2020-02-16 LAB — TRIGLYCERIDES: Triglycerides: 178 mg/dL — ABNORMAL HIGH (ref <150)

## 2020-02-16 LAB — CBC
HCT: 26.7 % — ABNORMAL LOW (ref 39.0–52.0)
Hemoglobin: 8.5 g/dL — ABNORMAL LOW (ref 13.0–17.0)
MCH: 28.2 pg (ref 26.0–34.0)
MCHC: 31.8 g/dL (ref 30.0–36.0)
MCV: 88.7 fL (ref 80.0–100.0)
Platelets: 289 10*3/uL (ref 150–400)
RBC: 3.01 MIL/uL — ABNORMAL LOW (ref 4.22–5.81)
RDW: 16.4 % — ABNORMAL HIGH (ref 11.5–15.5)
WBC: 9.4 10*3/uL (ref 4.0–10.5)
nRBC: 0 % (ref 0.0–0.2)

## 2020-02-16 LAB — GLUCOSE, CAPILLARY
Glucose-Capillary: 86 mg/dL (ref 70–99)
Glucose-Capillary: 116 mg/dL — ABNORMAL HIGH (ref 70–99)
Glucose-Capillary: 113 mg/dL — ABNORMAL HIGH (ref 70–99)
Glucose-Capillary: 110 mg/dL — ABNORMAL HIGH (ref 70–99)
Glucose-Capillary: 117 mg/dL — ABNORMAL HIGH (ref 70–99)

## 2020-02-16 LAB — BASIC METABOLIC PANEL
Anion gap: 11 (ref 5–15)
BUN: 14 mg/dL (ref 8–23)
CO2: 24 mmol/L (ref 22–32)
Calcium: 8.7 mg/dL — ABNORMAL LOW (ref 8.9–10.3)
Chloride: 105 mmol/L (ref 98–111)
Creatinine, Ser: 1.23 mg/dL (ref 0.61–1.24)
GFR, Estimated: 58 mL/min — ABNORMAL LOW (ref >60)
Glucose, Bld: 128 mg/dL — ABNORMAL HIGH (ref 70–99)
Potassium: 3.8 mmol/L (ref 3.5–5.1)
Sodium: 140 mmol/L (ref 135–145)

## 2020-02-16 LAB — VITAMIN B12: Vitamin B-12: 146 pg/mL — ABNORMAL LOW (ref 180–914)

## 2020-02-16 LAB — PATHOLOGIST SMEAR REVIEW

## 2020-02-16 LAB — IRON AND TIBC
Iron: 51 ug/dL (ref 45–182)
Saturation Ratios: 16 % — ABNORMAL LOW (ref 17.9–39.5)
TIBC: 321 ug/dL (ref 250–450)
UIBC: 270 ug/dL

## 2020-02-16 LAB — PHOSPHORUS: Phosphorus: 2.8 mg/dL (ref 2.5–4.6)

## 2020-02-16 MED ORDER — CLOPIDOGREL BISULFATE 75 MG PO TABS
75.0000 mg | ORAL_TABLET | Freq: Every day | ORAL | Status: DC
  Filled 2020-02-16: qty 1

## 2020-02-16 MED ORDER — DOCUSATE SODIUM 50 MG/5ML PO LIQD
100.0000 mg | Freq: Two times a day (BID) | ORAL | Status: DC
  Administered 2020-02-17: 100 mg
  Filled 2020-02-16: qty 10

## 2020-02-16 MED ORDER — ASPIRIN 81 MG PO CHEW
81.0000 mg | CHEWABLE_TABLET | Freq: Every day | ORAL | Status: DC
  Administered 2020-02-16 – 2020-02-19 (×4): 81 mg
  Filled 2020-02-16 (×3): qty 1

## 2020-02-16 MED ORDER — CLOPIDOGREL BISULFATE 75 MG PO TABS
75.0000 mg | ORAL_TABLET | Freq: Every day | ORAL | Status: DC
  Administered 2020-02-16 – 2020-02-19 (×4): 75 mg
  Filled 2020-02-16 (×3): qty 1

## 2020-02-16 MED ORDER — POTASSIUM CHLORIDE CRYS ER 20 MEQ PO TBCR: 40.0000 meq | EXTENDED_RELEASE_TABLET | Freq: Every day | ORAL | Status: DC

## 2020-02-16 MED ORDER — VITAMIN B-12 1000 MCG PO TABS
1000.0000 ug | ORAL_TABLET | Freq: Every day | ORAL | Status: DC
  Filled 2020-02-16: qty 1

## 2020-02-16 MED ORDER — ASPIRIN 81 MG PO CHEW
81.0000 mg | CHEWABLE_TABLET | Freq: Every day | ORAL | Status: DC
  Filled 2020-02-16: qty 1

## 2020-02-16 MED ORDER — MAGNESIUM SULFATE 2 GM/50ML IV SOLN
2.0000 g | Freq: Once | INTRAVENOUS | Status: AC
  Administered 2020-02-16: 2 g via INTRAVENOUS
  Filled 2020-02-16: qty 50

## 2020-02-16 MED ORDER — DOCUSATE SODIUM 50 MG/5ML PO LIQD
100.0000 mg | Freq: Two times a day (BID) | ORAL | Status: DC | PRN
  Administered 2020-02-18: 100 mg

## 2020-02-16 MED ORDER — POLYETHYLENE GLYCOL 3350 17 G PO PACK: 17.0000 g | PACK | Freq: Every day | ORAL | Status: DC

## 2020-02-16 MED ORDER — POTASSIUM CHLORIDE 20 MEQ/15ML (10%) PO SOLN
40.0000 meq | Freq: Every day | ORAL | Status: DC
  Administered 2020-02-16 – 2020-02-18 (×3): 40 meq
  Filled 2020-02-16 (×4): qty 30

## 2020-02-16 MED ORDER — DOCUSATE SODIUM 100 MG PO CAPS: 100.0000 mg | ORAL_CAPSULE | Freq: Two times a day (BID) | ORAL | Status: DC

## 2020-02-16 MED ORDER — ATORVASTATIN CALCIUM 80 MG PO TABS
80.0000 mg | ORAL_TABLET | Freq: Every day | ORAL | Status: DC
  Administered 2020-02-16 – 2020-02-19 (×4): 80 mg
  Filled 2020-02-16 (×3): qty 1

## 2020-02-16 MED ORDER — ATORVASTATIN CALCIUM 80 MG PO TABS
80.0000 mg | ORAL_TABLET | Freq: Every day | ORAL | Status: DC
  Filled 2020-02-16: qty 1

## 2020-02-16 MED ORDER — VITAMIN B-12 1000 MCG PO TABS
1000.0000 ug | ORAL_TABLET | Freq: Every day | ORAL | Status: DC
  Administered 2020-02-17 – 2020-02-19 (×3): 1000 ug
  Filled 2020-02-16 (×3): qty 1

## 2020-02-16 NOTE — Evaluation (Signed)
Clinical/Bedside Swallow Evaluation Patient Details  Name: Howard Allen MRN: [id number] Date of Birth: [birth year]  Today's Date: 02/16/2020 Time: SLP Start Time (ACUTE ONLY): 1150 SLP Stop Time (ACUTE ONLY): 1203 SLP Time Calculation (min) (ACUTE ONLY): 13 min  Past Medical History: No past medical history on file. HPI:  74 y.o. 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  MRI remarkable for "Signal abnormality in the left thalamus most consistent with an acute infarct."  Pt was intubated from 10/6-10/8.     Assessment / Plan / Recommendation Clinical Impression  Pt was seen for a bedside swallow evaluation in the setting of possible left thalamic infarct.  He was encountered awake but lethargic in bed with RN in room.  Attempted oral mechanism examination, but pt was unable to follow commands.  Oral care was provided by SLP and pt exhibited some lingual movement in response to suction swab.  Pt was presented with a small ice chip that he accepted passively into his oral cavity secondary to being unable to achieve labial closure.  The pt initially had no oral response to the ice chip being placed on his lingual surface, but he eventually attempted some lingual manipulation, with jaw movement noted and what appeared to be attempted swallow iniation.  Hyolaryngeal elevation/excursion were reduced per observation and palpation.  The patient had a similar response to 1/2 tsp of water via spoon.  Oral suctioning was completed following minimal PO trials.  Recommend continuation of NPO at this time with consideration of short-term alternative means of nutrition.  SLP will f/u for PO readiness.    SLP Visit Diagnosis: Dysphagia, unspecified (R13.10)    Aspiration Risk  Severe aspiration risk;Risk for inadequate nutrition/hydration    Diet Recommendation NPO;Alternative means -  temporary   Medication Administration: Via alternative means    Other  Recommendations Oral Care Recommendations: Oral care QID;Staff/trained caregiver to provide oral care   Follow up Recommendations Other (comment) (TBD)      Frequency and Duration min 2x/week  2 weeks       Prognosis Prognosis for Safe Diet Advancement: Good      Swallow Study   General HPI: 74 y.o. 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  MRI remarkable for "Signal abnormality in the left thalamus most consistent with an acute infarct."  Pt was intubated from 10/6-10/8.   Type of Study: Bedside Swallow Evaluation Previous Swallow Assessment: None  Diet Prior to this Study: NPO Temperature Spikes Noted: No Respiratory Status: Room air History of Recent Intubation: Yes Length of Intubations (days): 1 days Date extubated: 02/15/20 Behavior/Cognition: Lethargic/Drowsy;Doesn't follow directions Oral Cavity Assessment: Within Functional Limits Oral Care Completed by SLP: Yes Oral Cavity - Dentition: Adequate natural dentition Self-Feeding Abilities: Total assist Patient Positioning: Upright in bed Baseline Vocal Quality: Not observed Volitional Cough: Cognitively unable to elicit Volitional Swallow: Unable to elicit    Oral/Motor/Sensory Function Overall Oral Motor/Sensory Function: Other (comment) (Pt unable to follow commands to complete )   Ice Chips Ice chips: Impaired Presentation: Spoon Oral Phase Impairments: Reduced labial seal;Poor awareness of bolus;Reduced lingual movement/coordination Oral Phase Functional Implications: Prolonged oral transit Pharyngeal Phase Impairments: Decreased hyoid-laryngeal movement   Thin Liquid Thin Liquid: Impaired Presentation: Spoon Oral Phase Impairments: Reduced labial seal;Reduced lingual movement/coordination Oral Phase Functional  Implications: Prolonged oral transit Pharyngeal  Phase Impairments: Decreased hyoid-laryngeal movement    Nectar Thick Nectar Thick Liquid: Not tested   Honey Thick Honey Thick Liquid: Not tested   Puree Puree: Not tested   Solid     Solid: Not tested     Colin Mulders M.S., CCC-SLP Acute Rehabilitation Services Office: [phone]  Elvia Collum Tola Meas 02/16/2020,12:16 PM

## 2020-02-16 NOTE — Progress Notes (Signed)
Stat  EEG complete - results pending.  

## 2020-02-16 NOTE — Progress Notes (Signed)
.   Pt is in imaging will check back later for EEG.

## 2020-02-16 NOTE — Progress Notes (Signed)
Pharmacy Electrolyte Replacement  Recent Labs:  Recent Labs    02/16/20 0729 02/16/20 0729 02/16/20 1116  K 3.8   < > 3.6  MG 1.7  --   --   PHOS 2.8  --   --   CREATININE 1.23  --   --    < > = values in this interval not displayed.    Low Critical Values (K </= 2.5, Phos </= 1, Mg </= 1) Present: None   Plan:  - Mg 1.7 - supplement with Mg 2g IV x 1 - F/u Mg w/ AM labs  Thank you for allowing pharmacy to be a part of this patient's care.  Alycia Rossetti, PharmD, BCPS Clinical Pharmacist Clinical phone for 02/16/2020: [phone] 02/16/2020 1:47 PM   **Pharmacist phone directory can now be found on [email or web address] ([email or web address]).  Listed under [place].

## 2020-02-16 NOTE — Progress Notes (Addendum)
Subjective: Somnolent, but alert and follows commands. EEG tech at bedside to hook up EEG at this time.  Objective: Current vital signs: BP (!) 150/81   Pulse 74   Temp (!) 97.5 F (36.4 C) (Axillary)   Resp (!) 28   Ht 5\' 10"  (1.778 m)   Wt 59.5 kg   SpO2 100%   BMI 18.82 kg/m  Vital signs in last 24 hours: Temp:  [97.5 F (36.4 C)-98.1 F (36.7 C)] 97.5 F (36.4 C) (10/09 0800) Pulse Rate:  [66-80] 74 (10/09 0900) Resp:  [18-30] 28 (10/09 0900) BP: (117-159)/(67-119) 150/81 (10/09 0900) SpO2:  [100 %] 100 % (10/09 0900)  Intake/Output from previous day: 10/08 0701 - 10/09 0700 In: 2216.5 [I.V.:48.5; NG/GT:840; IV Piggyback:1328] Out: 1550 [Urine:1250; Stool:300] Intake/Output this shift: No intake/output data recorded. Nutritional status:  Diet Order            Diet NPO time specified  Diet effective now                Physical Exam  GEN: calm and no acute distress HEENT-  sutures c/d/i to right forehead laceration noted Lungs- Extubated, lungs clear, no sob Extremities- No cyanosis or pallor  Neurological Examination Mental Status: Alert, attends, follows simple commands very slowly, he attempts to speak, but its only a whisper and examiner is unable to read his lips.  Cranial Nerves: Rt eye swollen, difficult and painful to lift lid for full exam. PERRL, EOMI with left eye. Face symmetric.  Motor: Diffusely weak, unable to lift legs antigravity, but able to equally foot push and wiggle toes on command. Grips equal and weak 3/5.  Sensory: Seems to be equal response to LT on bilat feet/LE, shakes head "yes" that he can feel LT throughout rest of body, but this may be unreliable. Deep Tendon Reflexes: 1+ Cerebellar: Unable to test, too gen weak and unable to preform complex commands at this time  Lab Results: Results for orders placed or performed during the hospital encounter of 02/13/20 (from the past 48 hour(s))  CSF cell count with differential      Status: Abnormal   Collection Time: 02/14/20 11:01 AM  Result Value Ref Range   Tube # 3    Color, CSF COLORLESS COLORLESS   Appearance, CSF CLEAR (A) CLEAR   Supernatant NOT INDICATED    RBC Count, CSF 1 (H) 0 /cu mm   WBC, CSF 140 (HH) 0 - 5 /cu mm    Comment: CRITICAL RESULT CALLED TO, READ BACK BY AND VERIFIED WITH: A STANLEY RN 1231 [id number] BY A BENNETT    Segmented Neutrophils-CSF 64 (H) 0 - 6 %   Lymphs, CSF 32 (L) 40 - 80 %   Monocyte-Macrophage-Spinal Fluid 4 (L) 15 - 45 %   Eosinophils, CSF 0 0 - 1 %    Comment: Performed at [facility], [address], [place], [place] [address]  Protein and glucose, CSF     Status: Abnormal   Collection Time: 02/14/20 11:01 AM  Result Value Ref Range   Glucose, CSF <20 (LL) 40 - 70 mg/dL    Comment: CRITICAL RESULT CALLED TO, READ BACK BY AND VERIFIED WITH: J BASTABLE,RN 02/15/2020 1032 [email or web address]    Total  Protein, CSF 112 (H) 15 - 45 mg/dL    Comment: Performed at [facility] [address] [place], [place] [address]  CSF culture     Status: None (Preliminary result)   Collection Time:  02/14/20 11:01 AM   Specimen: CSF; Cerebrospinal Fluid  Result Value Ref Range   Specimen Description CSF    Special Requests NONE    Gram Stain      WBC PRESENT,BOTH PMN AND MONONUCLEAR NO ORGANISMS SEEN CYTOSPIN SMEAR    Culture      NO GROWTH 2 DAYS Performed at [facility], [place] [address] [place], [place] [address]    Report Status PENDING   Cryptococcal antigen, CSF     Status: None   Collection Time: 02/14/20 11:01 AM  Result Value Ref Range   Crypto Ag NEGATIVE NEGATIVE   Cryptococcal Ag Titer NOT INDICATED NOT INDICATED    Comment: Performed at [facility], [place] [address], [place], [place] [address]  Glucose, capillary     Status: Abnormal   Collection Time: 02/14/20 12:20 PM  Result Value Ref Range   Glucose-Capillary 122 (H) 70 - 99 mg/dL    Comment: Glucose reference range applies only to samples  taken after fasting for at least 8 hours.  Glucose, capillary     Status: Abnormal   Collection Time: 02/14/20  7:47 PM  Result Value Ref Range   Glucose-Capillary 130 (H) 70 - 99 mg/dL    Comment: Glucose reference range applies only to samples taken after fasting for at least 8 hours.  Glucose, capillary     Status: Abnormal   Collection Time: 02/14/20 11:41 PM  Result Value Ref Range   Glucose-Capillary 143 (H) 70 - 99 mg/dL    Comment: Glucose reference range applies only to samples taken after fasting for at least 8 hours.  Triglycerides     Status: Abnormal   Collection Time: 02/15/20  3:22 AM  Result Value Ref Range   Triglycerides 210 (H) <150 mg/dL    Comment: Performed at [facility] [address], [place], [place] [address]  CBC     Status: Abnormal   Collection Time: 02/15/20  3:22 AM  Result Value Ref Range   WBC 6.2 4.0 - 10.5 K/uL   RBC 2.84 (L) 4.22 - 5.81 MIL/uL   Hemoglobin 8.2 (L) 13.0 - 17.0 g/dL   HCT 24.4 (L) 39 - 52 %   MCV 85.9 80.0 - 100.0 fL   MCH 28.9 26.0 - 34.0 pg   MCHC 33.6 30.0 - 36.0 g/dL   RDW 15.8 (H) 11.5 - 15.5 %   Platelets 256 150 - 400 K/uL   nRBC 0.0 0.0 - 0.2 %    Comment: Performed at [facility], [place] [address] [place], [place] [address]  Magnesium     Status: None   Collection Time: 02/15/20  3:22 AM  Result Value Ref Range   Magnesium 1.8 1.7 - 2.4 mg/dL    Comment: Performed at [facility] [address] [place], [place] [address]  Phosphorus     Status: None   Collection Time: 02/15/20  3:22 AM  Result Value Ref Range   Phosphorus 3.1 2.5 - 4.6 mg/dL    Comment: Performed at [facility] [address], [place], [place] [address]  Comprehensive metabolic panel     Status: Abnormal   Collection Time: 02/15/20  3:22 AM  Result Value Ref Range   Sodium 135 135 - 145 mmol/L   Potassium 3.3 (L) 3.5 - 5.1 mmol/L   Chloride 102 98 - 111 mmol/L   CO2 21 (L) 22 - 32 mmol/L   Glucose,  Bld 164 (H) 70  - 99 mg/dL    Comment: Glucose reference range applies only to samples taken after fasting for at least 8 hours.   BUN 14 8 - 23 mg/dL   Creatinine, Ser 1.19 0.61 - 1.24 mg/dL   Calcium 8.3 (L) 8.9 - 10.3 mg/dL   Total Protein 5.1 (L) 6.5 - 8.1 g/dL   Albumin 2.5 (L) 3.5 - 5.0 g/dL   AST 20 15 - 41 U/L   ALT 14 0 - 44 U/L   Alkaline Phosphatase 21 (L) 38 - 126 U/L   Total Bilirubin 0.5 0.3 - 1.2 mg/dL   GFR calc non Af Amer >60 >60 mL/min   Anion gap 12 5 - 15    Comment: Performed at [facility], [place] [address], [place], [place] [address]  Glucose, capillary     Status: Abnormal   Collection Time: 02/15/20  3:47 AM  Result Value Ref Range   Glucose-Capillary 144 (H) 70 - 99 mg/dL    Comment: Glucose reference range applies only to samples taken after fasting for at least 8 hours.  Glucose, capillary     Status: Abnormal   Collection Time: 02/15/20  7:44 AM  Result Value Ref Range   Glucose-Capillary 156 (H) 70 - 99 mg/dL    Comment: Glucose reference range applies only to samples taken after fasting for at least 8 hours.  Glucose, capillary     Status: Abnormal   Collection Time: 02/15/20 11:34 AM  Result Value Ref Range   Glucose-Capillary 113 (H) 70 - 99 mg/dL    Comment: Glucose reference range applies only to samples taken after fasting for at least 8 hours.  Glucose, capillary     Status: Abnormal   Collection Time: 02/15/20  3:40 PM  Result Value Ref Range   Glucose-Capillary 121 (H) 70 - 99 mg/dL    Comment: Glucose reference range applies only to samples taken after fasting for at least 8 hours.  Glucose, capillary     Status: Abnormal   Collection Time: 02/15/20  7:38 PM  Result Value Ref Range   Glucose-Capillary 124 (H) 70 - 99 mg/dL    Comment: Glucose reference range applies only to samples taken after fasting for at least 8 hours.  Glucose, capillary     Status: Abnormal   Collection Time: 02/15/20 11:37 PM  Result Value Ref Range   Glucose-Capillary  142 (H) 70 - 99 mg/dL    Comment: Glucose reference range applies only to samples taken after fasting for at least 8 hours.  Glucose, capillary     Status: None   Collection Time: 02/16/20  3:40 AM  Result Value Ref Range   Glucose-Capillary 86 70 - 99 mg/dL    Comment: Glucose reference range applies only to samples taken after fasting for at least 8 hours.  Triglycerides     Status: Abnormal   Collection Time: 02/16/20  7:29 AM  Result Value Ref Range   Triglycerides 178 (H) <150 mg/dL    Comment: Performed at [facility] [address] [place] [address]  CBC     Status: Abnormal   Collection Time: 02/16/20  7:29 AM  Result Value Ref Range   WBC 9.4 4.0 - 10.5 K/uL   RBC 3.01 (L) 4.22 - 5.81 MIL/uL   Hemoglobin 8.5 (L) 13.0 - 17.0 g/dL   HCT 26.7 (L) 39 - 52 %   MCV 88.7 80.0 - 100.0 fL  MCH 28.2 26.0 - 34.0 pg   MCHC 31.8 30.0 - 36.0 g/dL   RDW 16.4 (H) 11.5 - 15.5 %   Platelets 289 150 - 400 K/uL   nRBC 0.0 0.0 - 0.2 %    Comment: Performed at [facility], [place] [address] [place], [place] [address]  Basic metabolic panel     Status: Abnormal   Collection Time: 02/16/20  7:29 AM  Result Value Ref Range   Sodium 140 135 - 145 mmol/L   Potassium 3.8 3.5 - 5.1 mmol/L   Chloride 105 98 - 111 mmol/L   CO2 24 22 - 32 mmol/L   Glucose, Bld 128 (H) 70 - 99 mg/dL    Comment: Glucose reference range applies only to samples taken after fasting for at least 8 hours.   BUN 14 8 - 23 mg/dL   Creatinine, Ser 1.23 0.61 - 1.24 mg/dL   Calcium 8.7 (L) 8.9 - 10.3 mg/dL   GFR, Estimated 58 (L) >60 mL/min   Anion gap 11 5 - 15    Comment: Performed at [facility] [address], [place], [place] [address]  Magnesium     Status: None   Collection Time: 02/16/20  7:29 AM  Result Value Ref Range   Magnesium 1.7 1.7 - 2.4 mg/dL    Comment: Performed at [facility] [address] [place], [place] [address]  Phosphorus     Status: None   Collection  Time: 02/16/20  7:29 AM  Result Value Ref Range   Phosphorus 2.8 2.5 - 4.6 mg/dL    Comment: Performed at [facility] [address] [place], [place] [address]  Glucose, capillary     Status: Abnormal   Collection Time: 02/16/20  7:53 AM  Result Value Ref Range   Glucose-Capillary 116 (H) 70 - 99 mg/dL    Comment: Glucose reference range applies only to samples taken after fasting for at least 8 hours.    Recent Results (from the past 240 hour(s))  Respiratory Panel by RT PCR (Flu A&B, Covid) - Nasopharyngeal Swab     Status: None   Collection Time: 02/13/20  4:17 PM   Specimen: Nasopharyngeal Swab  Result Value Ref Range Status   SARS Coronavirus 2 by RT PCR NEGATIVE NEGATIVE Final    Comment: (NOTE) SARS-CoV-2 target nucleic acids are NOT DETECTED.  The SARS-CoV-2 RNA is generally detectable in upper respiratoy specimens during the acute phase of infection. The lowest concentration of SARS-CoV-2 viral copies this assay can detect is 131 copies/mL. A negative result does not preclude SARS-Cov-2 infection and should not be used as the sole basis for treatment or other patient management decisions. A negative result may occur with  improper specimen collection/handling, submission of specimen other than nasopharyngeal swab, presence of viral mutation(s) within the areas targeted by this assay, and inadequate number of viral copies (<131 copies/mL). A negative result must be combined with clinical observations, patient history, and epidemiological information. The expected result is Negative.  Fact Sheet for Patients:  PinkCheek.be  Fact Sheet for Healthcare Providers:  GravelBags.it  This test is no t yet approved or cleared by the [place] FDA and  has been authorized for detection and/or diagnosis of SARS-CoV-2 by FDA under an Emergency Use Authorization (EUA). This EUA will remain  in effect (meaning  this test can be used) for the duration of the COVID-19 declaration under Section 564(b)(1) of the Act, 21 U.S.C.  section 360bbb-3(b)(1), unless the authorization is terminated or revoked sooner.     Influenza A by PCR NEGATIVE NEGATIVE Final   Influenza B by PCR NEGATIVE NEGATIVE Final    Comment: (NOTE) The Xpert Xpress SARS-CoV-2/FLU/RSV assay is intended as an aid in  the diagnosis of influenza from Nasopharyngeal swab specimens and  should not be used as a sole basis for treatment. Nasal washings and  aspirates are unacceptable for Xpert Xpress SARS-CoV-2/FLU/RSV  testing.  Fact Sheet for Patients: PinkCheek.be  Fact Sheet for Healthcare Providers: GravelBags.it  This test is not yet approved or cleared by the [place] FDA and  has been authorized for detection and/or diagnosis of SARS-CoV-2 by  FDA under an Emergency Use Authorization (EUA). This EUA will remain  in effect (meaning this test can be used) for the duration of the  Covid-19 declaration under Section 564(b)(1) of the Act, 21  U.S.C. section 360bbb-3(b)(1), unless the authorization is  terminated or revoked. Performed at [facility], [place] [address], [place], [place] [address]   Culture, blood (Routine X 2) w Reflex to ID Panel     Status: None (Preliminary result)   Collection Time: 02/13/20  4:57 PM   Specimen: BLOOD LEFT FOREARM  Result Value Ref Range Status   Specimen Description BLOOD LEFT FOREARM  Final   Special Requests   Final    BOTTLES DRAWN AEROBIC AND ANAEROBIC Blood Culture results may not be optimal due to an excessive volume of blood received in culture bottles   Culture   Final    NO GROWTH 3 DAYS Performed at [facility], [place] [address], [place], [place] [address]    Report Status PENDING  Incomplete  Culture, blood (Routine X 2) w Reflex to ID Panel     Status: None (Preliminary result)   Collection Time:  02/13/20  5:02 PM   Specimen: BLOOD LEFT WRIST  Result Value Ref Range Status   Specimen Description BLOOD LEFT WRIST  Final   Special Requests   Final    BOTTLES DRAWN AEROBIC AND ANAEROBIC Blood Culture results may not be optimal due to an excessive volume of blood received in culture bottles   Culture   Final    NO GROWTH 3 DAYS Performed at [facility], [place] [address], [place], [place] [address]    Report Status PENDING  Incomplete  MRSA PCR Screening     Status: Abnormal   Collection Time: 02/14/20  3:03 AM   Specimen: Nasal Mucosa; Nasopharyngeal  Result Value Ref Range Status   MRSA by PCR POSITIVE (A) NEGATIVE Final    Comment:        The GeneXpert MRSA Assay (FDA approved for NASAL specimens only), is one component of a comprehensive MRSA colonization surveillance program. It is not intended to diagnose MRSA infection nor to guide or monitor treatment for MRSA infections. RESULT CALLED TO, READ BACK BY AND VERIFIED WITH: Lillie Fragmin RN 8:25 02/14/20 ([email or web address]) Performed at [facility], [place] [address] [place], [place] [address]   CSF culture     Status: None (Preliminary result)   Collection Time: 02/14/20 11:01 AM   Specimen: CSF; Cerebrospinal Fluid  Result Value Ref Range Status   Specimen Description CSF  Final   Special Requests NONE  Final   Gram Stain   Final    WBC PRESENT,BOTH PMN AND MONONUCLEAR NO ORGANISMS SEEN CYTOSPIN SMEAR    Culture   Final  NO GROWTH 2 DAYS Performed at [facility], [place] [address], [place], [place] [address]    Report Status PENDING  Incomplete    Lipid Panel Recent Labs    02/16/20 0729  TRIG 178*    Studies/Results: MR BRAIN W WO CONTRAST  Result Date: 02/14/2020 CLINICAL DATA:  Seizures.  Meningitis. EXAM: MRI HEAD WITHOUT AND WITH CONTRAST TECHNIQUE: Multiplanar, multiecho pulse sequences of the brain and surrounding structures were obtained without and with intravenous contrast. CONTRAST:   6.67mL GADAVIST GADOBUTROL 1 MMOL/ML IV SOLN COMPARISON:  Head CT 02/13/2020 FINDINGS: Brain: There is a 2.3 cm region of restricted diffusion and T2 hyperintensity/edema involving the left thalamus. Restricted diffusion and T2 hyperintensity are also noted throughout much of the left hippocampus. There is no associated abnormal enhancement, and the mesial right temporal lobe is normal in signal. There is a punctate focus of restricted diffusion in the midline of the splenium of the corpus callosum. T2 hyperintensities in the cerebral white matter bilaterally are nonspecific but compatible with mild chronic small vessel ischemic disease. Mild cerebral atrophy is within normal limits for age. No intracranial hemorrhage, mass, midline shift, or extra-axial fluid collection is identified. Vascular: Major intracranial vascular flow voids are preserved. Skull and upper cervical spine: Unremarkable bone marrow signal para Sinuses/Orbits: Bilateral cataract extraction. Clear paranasal sinuses. Trace right and small left mastoid effusions as well as layering pharyngeal fluid in the setting of intubation. Other: None. IMPRESSION: 1. Signal abnormality in the left thalamus most consistent with an acute infarct. 2. Signal abnormality in the mesial left temporal lobe and splenium of the corpus callosum which may reflect acute infarct, sequelae of recent seizure activity, or encephalitis (including herpes encephalitis). No contralateral cerebral involvement. 3. Mild chronic small vessel ischemic disease. Electronically Signed   By: Logan Bores M.D.   On: 02/14/2020 18:42   ECHOCARDIOGRAM COMPLETE BUBBLE STUDY  Result Date: 02/15/2020    ECHOCARDIOGRAM REPORT   Patient Name:   Howard Allen Date of Exam: 02/15/2020 Medical Rec #:  [id number]         Height:       70.0 in Accession #:    [id number]        Weight:       131.2 lb Date of Birth:  [birth year]         BSA:          1.745 m Patient Age:    42 years          BP:            110/70 mmHg Patient Gender: M                 HR:           66 bpm. Exam Location:  Inpatient Procedure: 2D Echo, Cardiac Doppler, Color Doppler and Saline Contrast Bubble            Study Indications:    Stroke  History:        Patient has no prior history of Echocardiogram examinations.                 Risk Factors:Hypertension and Dyslipidemia.  Sonographer:    Clayton Lefort RDCS (AE) Referring Phys: [id number] [place]  1. Left ventricular ejection fraction, by estimation, is 55 to 60%. The left ventricle has normal function. The left ventricle has no regional wall motion abnormalities. There is moderate left ventricular hypertrophy. Left ventricular diastolic parameters are  consistent with Grade I diastolic dysfunction (impaired relaxation).  2. Right ventricular systolic function is normal. The right ventricular size is normal.  3. Right atrial size was mildly dilated.  4. The mitral valve is normal in structure. Mild mitral valve regurgitation. No evidence of mitral stenosis.  5. The aortic valve is normal in structure. Aortic valve regurgitation is not visualized. No aortic stenosis is present. FINDINGS  Left Ventricle: Left ventricular ejection fraction, by estimation, is 55 to 60%. The left ventricle has normal function. The left ventricle has no regional wall motion abnormalities. The left ventricular internal cavity size was normal in size. There is  moderate left ventricular hypertrophy. Left ventricular diastolic parameters are consistent with Grade I diastolic dysfunction (impaired relaxation). Right Ventricle: The right ventricular size is normal. No increase in right ventricular wall thickness. Right ventricular systolic function is normal. Left Atrium: Left atrial size was normal in size. Right Atrium: Right atrial size was mildly dilated. Pericardium: There is no evidence of pericardial effusion. Mitral Valve: The mitral valve is normal in structure. Mild mitral valve  regurgitation. No evidence of mitral valve stenosis. MV peak gradient, 5.6 mmHg. The mean mitral valve gradient is 2.0 mmHg. Tricuspid Valve: The tricuspid valve is normal in structure. Tricuspid valve regurgitation is trivial. Aortic Valve: The aortic valve is normal in structure. Aortic valve regurgitation is not visualized. No aortic stenosis is present. Aortic valve mean gradient measures 4.0 mmHg. Aortic valve peak gradient measures 6.9 mmHg. Aortic valve area, by VTI measures 2.37 cm. Pulmonic Valve: The pulmonic valve was normal in structure. Pulmonic valve regurgitation is not visualized. No evidence of pulmonic stenosis. Aorta: The aortic root and ascending aorta are structurally normal, with no evidence of dilitation. IAS/Shunts: The atrial septum is grossly normal. Agitated saline contrast was given intravenously to evaluate for intracardiac shunting.  LEFT VENTRICLE PLAX 2D LVIDd:         4.20 cm  Diastology LVIDs:         2.20 cm  LV e' medial:    5.98 cm/s LV PW:         1.60 cm  LV E/e' medial:  12.7 LV IVS:        1.50 cm  LV e' lateral:   8.70 cm/s LVOT diam:     1.80 cm  LV E/e' lateral: 8.7 LV SV:         60 LV SV Index:   34 LVOT Area:     2.54 cm  RIGHT VENTRICLE             IVC RV Basal diam:  4.00 cm     IVC diam: 1.00 cm RV Mid diam:    3.00 cm RV S prime:     14.50 cm/s TAPSE (M-mode): 2.5 cm LEFT ATRIUM             Index       RIGHT ATRIUM           Index LA diam:        3.80 cm 2.18 cm/m  RA Area:     18.70 cm LA Vol (A2C):   40.7 ml 23.33 ml/m RA Volume:   54.00 ml  30.95 ml/m LA Vol (A4C):   39.7 ml 22.75 ml/m LA Biplane Vol: 43.8 ml 25.10 ml/m  AORTIC VALVE AV Area (Vmax):    2.21 cm AV Area (Vmean):   2.00 cm AV Area (VTI):     2.37 cm AV Vmax:  131.00 cm/s AV Vmean:          90.100 cm/s AV VTI:            0.252 m AV Peak Grad:      6.9 mmHg AV Mean Grad:      4.0 mmHg LVOT Vmax:         114.00 cm/s LVOT Vmean:        70.700 cm/s LVOT VTI:          0.235 m LVOT/AV  VTI ratio: 0.93  AORTA Ao Root diam: 3.80 cm Ao Asc diam:  3.50 cm MITRAL VALVE                TRICUSPID VALVE MV Area (PHT): 2.80 cm     TR Peak grad:   28.5 mmHg MV Peak grad:  5.6 mmHg     TR Vmax:        267.00 cm/s MV Mean grad:  2.0 mmHg MV Vmax:       1.18 m/s     SHUNTS MV Vmean:      64.2 cm/s    Systemic VTI:  0.24 m MV Decel Time: 271 msec     Systemic Diam: 1.80 cm MV E velocity: 75.80 cm/s MV A velocity: 102.00 cm/s MV E/A ratio:  0.74 Mertie Moores MD Electronically signed by Mertie Moores MD Signature Date/Time: 02/15/2020/2:14:28 PM    Final     Medications:  Scheduled: . aspirin  81 mg Oral Daily  . atorvastatin  80 mg Oral Daily  . Chlorhexidine Gluconate Cloth  6 each Topical Daily  . clopidogrel  75 mg Oral Daily  . dexamethasone (DECADRON) injection  10 mg Intravenous Q6H  . docusate sodium  100 mg Oral BID  . feeding supplement (PROSource TF)  45 mL Per Tube Daily  . heparin  5,000 Units Subcutaneous Q8H  . insulin aspart  0-9 Units Subcutaneous Q4H  . mupirocin ointment  1 application Nasal BID  . pantoprazole (PROTONIX) IV  40 mg Intravenous Daily  . polyethylene glycol  17 g Oral Daily  . potassium chloride  40 mEq Oral Daily   Continuous: . sodium chloride 20 mL/hr at 02/15/20 0400  . acyclovir 690 mg (02/16/20 1012)  . ampicillin (OMNIPEN) IV Stopped (02/16/20 0555)  . cefTRIAXone (ROCEPHIN)  IV 2 g (02/16/20 1015)  . feeding supplement (VITAL AF 1.2 CAL) 60 mL/hr at 02/15/20 0400  . levETIRAcetam Stopped (02/16/20 0524)  . norepinephrine (LEVOPHED) Adult infusion Stopped (02/13/20 1919)  . propofol (DIPRIVAN) infusion Stopped (02/15/20 0834)  . vancomycin Stopped (02/16/20 0402)    Imaging MRI brain with and without contrast 02/14/2020: Signal abnormality in the left thalamus most consistent with an acute infarct.  Signal abnormality in the mesial left temporal lobe and splenium of the corpus callosum which may reflect acute infarct, sequelae of recent  seizure activity, or encephalitis (including herpes encephalitis). No contralateral cerebral involvement.       Assessment: 74 year old male who presented with new onset status epilepticus. Treated with Keppra bolus, Ativan and Versed. Did req intubation in ER, but now extubated.  1. 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 Acute left thalamic infarct-On review of MRI along with Dr. Cheral Marker, the MRI changes are most likely related to patient's seizure, less likely that this is an acute infarct given atypical location as well as patient's clinical presentation. Several case reports in the literature detailing MRI changes in status epilepticus were found with  similar patterns of hippocampal hyperintensity with ipsilateral focus of thalamic hyperintensity. Will need to consider repeat MRI to confirm interval changes more consistent with cytotoxic edema from prolonged seizure rather than stroke, in 2-3 mo. No TPA administered as patient presented with seizures and no last known normal. Routine stroke measures for secondary prevention in case this is infarct as we will not know for sure till down the line.  3. Fever and leukocytosis- LP showed 140 WBCs with 64% neutrophils and 32% lymphocytes, protein 112 and glucose less than 20 consistent with meningitis. IV Abx started. Meningitis may have been the condition precipitating the patient's status epilepticus.  4. Multiple falls w/injury- right side of eye with sutures. Falls likely due to seizures prior to admit   Recommendations: -On antibiotics and acyclovir per critical care team -Continue Keppra 1000 mg twice daily IV -Seizure precautions -Started on aspirin and Plavix for 3 weeks, then monotherapy with ASA only.  -Added lipids to prev labs to complete stroke measures -TTE pending -We will repeat MRI brain in 8 to 12 weeks to see if MRI changes resolve which would make it more likely that these were  seizure-related. -Continue seizure precautions  -As needed IV Ativan 2 mg for clinical seizure-like activity -EEG pending  Desiree Metzger-Cihelka, ARNP-C, ANVP-BC Pager: [phone]   LOS: 3 days    Addendum: EEG report: ABNORMALITY: Excessive beta, generalized IMPRESSION: This study is within normal limits.No seizures or epileptiform discharges were seen throughout the recording. The excessive beta activity can be seen with medication use like benzodiazepines and is a benign eeg pattern.   Electronically signed: Dr. Kerney Elbe

## 2020-02-16 NOTE — Progress Notes (Signed)
NAME:  Howard Allen, MRN:  [id number], DOB:  Feb 12, 1946, LOS: 3 ADMISSION DATE:  02/13/2020, CONSULTATION DATE:  02/13/2020 REFERRING MD:  Cheral Marker, CHIEF COMPLAINT:  Seizure   Brief History   74 y.o. 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  CT head negative. PCCM consulted for admission.  History of present illness   Howard Allen is a 74 y.o. M with PMH of  Crohn's Disease, HTN, HL, Depressio, hospitalization in 10/2019 for pneumatosis s/p ileocolic resection and lysis of adhesions who has had mulitple recent seizures and was treated at outside hospitals. Pt was brought in by EMS for at least three witnessed generalized tonic-clonic seizures today.  He presented unresponsive and was intubated on arrival.   Past Medical History  Crohn's Disease and SBO, HTN, HL, Depression  Significant Hospital Events   10/6 >> Admitted to [facility]  Consults:  Neurology  Procedures:  10/6 >> ETT placed 10/7 >> LP 10/8 >> Extubated   Significant Diagnostic Tests:  10/6 CT Head >> No acute intracranial abnormalities; age-related volume loss and chronic small vessel disease  Micro Data:  10/6 COVID-19 + influenza >> Negative  10/6 Blood Cultures >> Pending 10/7 CSF Culture >> Pending   Antimicrobials:  Vancomycin 10/7 >> Ceftriaxone 10/7 >> Ampicillin 10/7 >> Acyclovir 10/7 >>  Interim history/subjective:   Howard Allen successfully extubated yesterday without complications. He has been oxygenating well on [place] since.   This morning, Howard Allen is unable to answer any questions or follow commands. Vital signs have remained stable overnight.   Objective   Blood pressure (!) 145/82, pulse 75, temperature 98.1 F (36.7 C), temperature source Oral, resp. rate (!) 26, height 5\' 10"  (1.778 m), weight 59.5 kg, SpO2 100 %.    Vent Mode: PCV;CPAP FiO2 (%):  [40 %] 40 % Pressure Support:   [5 cmH20] 5 cmH20   Intake/Output Summary (Last 24 hours) at 02/16/2020 0807 Last data filed at 02/16/2020 0700 Gross per 24 hour  Intake 2070.71 ml  Output 1450 ml  Net 620.71 ml   Filed Weights   02/13/20 1715 02/15/20 0500  Weight: 68.9 kg 59.5 kg   Physical Exam Vitals and nursing note reviewed.  Constitutional:      Appearance: He is underweight. He is not diaphoretic.     Comments: Difficult to wake; not on sedatives.  HENT:     Head: Normocephalic and atraumatic.  Cardiovascular:     Rate and Rhythm: Normal rate and regular rhythm.     Heart sounds: No murmur heard.   Pulmonary:     Effort: Pulmonary effort is normal. No respiratory distress.     Breath sounds: No wheezing or rales.  Abdominal:     General: Bowel sounds are normal. There is no distension.     Palpations: Abdomen is soft.     Tenderness: There is no abdominal tenderness. There is no guarding.  Musculoskeletal:     Right lower leg: No edema.     Left lower leg: No edema.  Skin:    General: Skin is warm and dry.     Findings: No erythema, lesion or rash.  Neurological:     General: No focal deficit present.     Cranial Nerves: No facial asymmetry.     Comments: Not following commands this morning, very lethargic. Moving all extremities spontaneously and responds to painful stimuli. Unable to assess pupils as  he covers his face when attempts are made.     Resolved Hospital Problem list   AKI Acute Respiratory Failure  Assessment & Plan:   # Meningitis  - LP performed on 10/7 consistent with meningitis - Cultures are NGTD at < 24 hours; HSV, VDLR, and [place] serology pending. Crypto negative. Quantiferon gold pending.  - Continue Vancomycin, Ceftriaxone, Ampicillin, Acyclovir, Decadron  - Morning after extubation, patient is much more lethargic, not following commands, only responsive to painful stimuli. Likely sequela from infection  # ? 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  - Neurology has reviewed imaged; feel more likely 2/2 to seizures. Will need to discuss with Neurology if DAPT is still indicated.  - TTE negative for vegetations or thrombosis.  - Continue high intensity statin for now  # Tonic-Clonic Seizures # Status Epilepticus  - Secondary to meningitis  - Neurology on board; appreciate their recommendations - Continue Keppra at this time and PRN Ativan in case of recurrent seizure   # Hypokalemia  # Hypomagnesemia  # Hypophosphoremia - Likely due to malnutrition and anorexia, possibly worsened by malabsorption given bowel inflammation seen on imaging the past several months prior to resection.  - Monitor K, Mg, and P daily - Replenish PRN  # HTN # HLD - Home medications include Toprol 100 mg, Amlodipine 2.5 mg, Lipitor 20 mg, Fenofibrate 160 mg  - Blood Pressure increasing this AM, will plan to restart home BP medications slowly.   # Normocytic Anemia  # Hx of Iron Deficiency Anemia  - Hemoglobin is stable at this time.  - Will continue to monitor and transfuse if hemoglobin < 7.0   # ? Crohn's Disease s/p Ileocolic Resection  - No longer on immunosuppressive therapy.   Best practice:  Diet: NPO till swallow study Pain/Anxiety/Delirium protocol (if indicated): N/A  VAP protocol (if indicated): N/A DVT prophylaxis: Heparin GI prophylaxis: Protonix Glucose control: N/A Mobility: Bedrest Code Status: FULL Family Communication: Daughter updated at bedside  Disposition: ICU  Labs   CBC: Recent Labs  Lab 02/13/20 1617 02/13/20 1749 02/14/20 0418 02/15/20 0322 02/16/20 0729  WBC 17.3*  --  9.0 6.2 9.4  NEUTROABS 14.4*  --   --   --   --   HGB 10.9* 9.2* 9.1* 8.2* 8.5*  HCT 36.3* 27.0* 27.4* 24.4* 26.7*  MCV 95.8  --  87.3 85.9 88.7  PLT 461*  --  281 256 289     Basic Metabolic Panel: Recent Labs  Lab 02/13/20 1617 02/13/20 1749 02/13/20 1930 02/14/20 0418 02/15/20 0322 02/16/20 0729  NA 137 137  --  138 135 140  K 2.8* 2.5*  --  3.4* 3.3* 3.8  CL 96*  --   --  103 102 105  CO2 15*  --   --  21* 21* 24  GLUCOSE 333*  --   --  102* 164* 128*  BUN 19  --   --  17 14 14   CREATININE 1.91*  --  1.71* 1.44* 1.19 1.23  CALCIUM 8.9  --   --  8.7* 8.3* 8.7*  MG  --   --  1.0* 1.4* 1.8 1.7  PHOS  --   --  1.5* 1.6* 3.1 2.8   GFR: Estimated Creatinine Clearance: 45.7 mL/min (by C-G formula based on SCr of 1.23 mg/dL). Recent  Labs  Lab 02/13/20 1617 02/13/20 1926 02/13/20 1935 02/14/20 0418 02/15/20 0322 02/16/20 0729  WBC 17.3*  --   --  9.0 6.2 9.4  LATICACIDVEN  --  1.9 2.2*  --   --   --     Liver Function Tests: Recent Labs  Lab 02/13/20 1617 02/15/20 0322  AST 32 20  ALT 17 14  ALKPHOS 31* 21*  BILITOT 0.5 0.5  PROT 6.7 5.1*  ALBUMIN 3.7 2.5*   No results for input(s): LIPASE, AMYLASE in the last 168 hours. No results for input(s): AMMONIA in the last 168 hours.  ABG    Component Value Date/Time   PHART 7.502 (H) 02/14/2020 0335   PCO2ART 30.5 (L) 02/14/2020 0335   PO2ART 140 (H) 02/14/2020 0335   HCO3 23.7 02/14/2020 0335   TCO2 25 02/13/2020 1749   ACIDBASEDEF 2.0 02/13/2020 1749   O2SAT 99.3 02/14/2020 0335     Coagulation Profile: No results for input(s): INR, PROTIME in the last 168 hours.  Cardiac Enzymes: Recent Labs  Lab 02/13/20 1930  CKTOTAL 128    HbA1C: Hgb A1c MFr Bld  Date/Time Value Ref Range Status  02/13/2020 07:30 PM 5.3 4.8 - 5.6 % Final    Comment:    (NOTE) Pre diabetes:          5.7%-6.4%  Diabetes:              >6.4%  Glycemic control for   <7.0% adults with diabetes     CBG: Recent Labs  Lab 02/15/20 1540 02/15/20 1938 02/15/20 2337 02/16/20 0340 02/16/20 0753  GLUCAP 121* 124* 142* 86 116*    Review of Systems:   Negative except as noted above  Past  Medical History  He,  has no past medical history on file.   Surgical History     Social History      Family History   His family history is not on file.   Allergies Allergies  Allergen Reactions   Lisinopril Anaphylaxis, Cough and Other (See Comments)    Other reaction(s): anaphylaxis Pt states it made him "cough his head off"    Lorazepam     Other reaction(s): Hallucinations     Home Medications  Prior to Admission medications   Medication Sig Start Date End Date Taking? Authorizing Provider  albuterol (VENTOLIN HFA) 108 (90 Base) MCG/ACT inhaler Inhale 2 puffs into the lungs every 6 (six) hours as needed for wheezing or shortness of breath.  08/27/19  Yes [provider]  amLODipine (NORVASC) 2.5 MG tablet Take 2.5 mg by mouth daily. 01/30/20  Yes [provider]  atorvastatin (LIPITOR) 20 MG tablet Take 20 mg by mouth daily.  01/30/20  Yes [provider]  busPIRone (BUSPAR) 15 MG tablet Take 15 mg by mouth 2 (two) times daily. 01/30/20  Yes [provider]  cyproheptadine (PERIACTIN) 4 MG tablet Take 4 mg by mouth 2 (two) times daily. 01/30/20  Yes [provider]  fenofibrate 160 MG tablet Take 160 mg by mouth daily. 01/30/20  Yes [provider]  metoprolol succinate (TOPROL-XL) 100 MG 24 hr tablet Take 100 mg by mouth daily. 01/30/20  Yes [provider]  pantoprazole (PROTONIX) 20 MG tablet Take 20 mg by mouth daily. 01/30/20  Yes [provider]  budesonide (ENTOCORT EC) 3 MG 24 hr capsule Take 3 mg by mouth daily.    [provider]  doxazosin (CARDURA) 1 MG tablet Take 1 mg by  mouth daily. 08/27/19   [provider]  hydrochlorothiazide (HYDRODIURIL) 25 MG tablet Take 25 mg by mouth daily. 08/28/19   [provider]  meclizine (ANTIVERT) 25 MG tablet Take 25 mg by mouth 2 (two) times daily as needed for dizziness.  10/12/19   [provider]     Dr. Jose Persia Internal Medicine PGY-2  Pager: [phone]  02/16/2020, 8:07 AM

## 2020-02-16 NOTE — Procedures (Signed)
Patient Name:Deano Chipper Herb TMB:[id number] Epilepsy Attending:Markayla Reichart Barbra Sarks Referring Physician/Provider:Dr Margaretmary Bayley basaraba Date:02/16/2020 Duration:25.30 mins  Patient [id number] M with new onset status epilepticus, EEG to evaluate for seizure.  Level of alertness:awake  AEDs during EEG study:LEV, versed, propofol  Technical aspects: This EEG study was done with scalp electrodes positioned according to the 10-20 International system of electrode placement. Electrical activity was acquired at a sampling rate of 500Hz  and reviewed with a high frequency filter of 70Hz  and a low frequency filter of 1Hz . EEG data were recorded continuously and digitally stored.   Description: No posterior dominant rhythm was seen. EEG showed continuous generalized maximal bifrontal 15-18 Hz beta activity. Hyperventilation and photic stimulation were not performed.   ABNORMALITY -Excessive beta, generalized  IMPRESSION: This study is within normal limits.No seizures or epileptiform discharges were seen throughout the recording. The excessive beta activity can be seen with medication use like benzodiazepines and is a benign eeg pattern.   Makalia Bare Barbra Sarks

## 2020-02-17 DIAGNOSIS — R569 Unspecified convulsions: Secondary | ICD-10-CM | POA: Diagnosis not present

## 2020-02-17 LAB — GLUCOSE, CAPILLARY
Glucose-Capillary: 104 mg/dL — ABNORMAL HIGH (ref 70–99)
Glucose-Capillary: 106 mg/dL — ABNORMAL HIGH (ref 70–99)
Glucose-Capillary: 114 mg/dL — ABNORMAL HIGH (ref 70–99)
Glucose-Capillary: 120 mg/dL — ABNORMAL HIGH (ref 70–99)
Glucose-Capillary: 126 mg/dL — ABNORMAL HIGH (ref 70–99)
Glucose-Capillary: 130 mg/dL — ABNORMAL HIGH (ref 70–99)
Glucose-Capillary: 97 mg/dL (ref 70–99)

## 2020-02-17 LAB — MAGNESIUM: Magnesium: 1.9 mg/dL (ref 1.7–2.4)

## 2020-02-17 LAB — CBC
HCT: 28.3 % — ABNORMAL LOW (ref 39.0–52.0)
Hemoglobin: 8.9 g/dL — ABNORMAL LOW (ref 13.0–17.0)
MCH: 27.9 pg (ref 26.0–34.0)
MCHC: 31.4 g/dL (ref 30.0–36.0)
MCV: 88.7 fL (ref 80.0–100.0)
Platelets: 303 10*3/uL (ref 150–400)
RBC: 3.19 MIL/uL — ABNORMAL LOW (ref 4.22–5.81)
RDW: 16.4 % — ABNORMAL HIGH (ref 11.5–15.5)
WBC: 7.6 10*3/uL (ref 4.0–10.5)
nRBC: 0 % (ref 0.0–0.2)

## 2020-02-17 LAB — BASIC METABOLIC PANEL
Anion gap: 10 (ref 5–15)
BUN: 18 mg/dL (ref 8–23)
CO2: 24 mmol/L (ref 22–32)
Calcium: 9.1 mg/dL (ref 8.9–10.3)
Chloride: 107 mmol/L (ref 98–111)
Creatinine, Ser: 1.18 mg/dL (ref 0.61–1.24)
GFR, Estimated: >60 mL/min (ref >60)
Glucose, Bld: 106 mg/dL — ABNORMAL HIGH (ref 70–99)
Potassium: 3.8 mmol/L (ref 3.5–5.1)
Sodium: 141 mmol/L (ref 135–145)

## 2020-02-17 LAB — QUANTIFERON-TB GOLD PLUS (RQFGPL)
QuantiFERON Mitogen Value: 0.11 IU/mL
QuantiFERON Nil Value: 0 IU/mL
QuantiFERON TB1 Ag Value: 0 IU/mL
QuantiFERON TB2 Ag Value: 0 IU/mL

## 2020-02-17 LAB — TRIGLYCERIDES: Triglycerides: 181 mg/dL — ABNORMAL HIGH (ref <150)

## 2020-02-17 LAB — QUANTIFERON-TB GOLD PLUS: QuantiFERON-TB Gold Plus: UNDETERMINED — AB

## 2020-02-17 LAB — CSF CULTURE W GRAM STAIN: Culture: NO GROWTH

## 2020-02-17 LAB — PHOSPHORUS: Phosphorus: 2.8 mg/dL (ref 2.5–4.6)

## 2020-02-17 LAB — HSV(HERPES SMPLX VRS)ABS-I+II(IGG)-CSF: HSV Type I/II Ab, IgG CSF: 2.02 IV — ABNORMAL HIGH (ref <=0.89)

## 2020-02-17 LAB — VANCOMYCIN, TROUGH: Vancomycin Tr: 13 ug/mL — ABNORMAL LOW (ref 15–20)

## 2020-02-17 MED ORDER — AMLODIPINE 1 MG/ML ORAL SUSPENSION: 2.5000 mg | Freq: Every day | ORAL | Status: DC

## 2020-02-17 MED ORDER — AMLODIPINE BESYLATE 2.5 MG PO TABS
2.5000 mg | ORAL_TABLET | Freq: Every day | ORAL | Status: DC
  Administered 2020-02-17 – 2020-02-18 (×2): 2.5 mg
  Filled 2020-02-17 (×2): qty 1

## 2020-02-17 MED ORDER — VANCOMYCIN HCL 750 MG/150ML IV SOLN
750.0000 mg | Freq: Two times a day (BID) | INTRAVENOUS | Status: DC
  Administered 2020-02-18 – 2020-02-19 (×3): 750 mg via INTRAVENOUS
  Filled 2020-02-17 (×6): qty 150

## 2020-02-17 MED ORDER — LABETALOL HCL 5 MG/ML IV SOLN: 5.0000 mg | INTRAVENOUS | Status: DC | PRN

## 2020-02-17 MED ORDER — ADULT MULTIVITAMIN LIQUID CH
15.0000 mL | Freq: Every day | ORAL | Status: DC
  Administered 2020-02-17 – 2020-02-18 (×2): 15 mL
  Filled 2020-02-17: qty 15

## 2020-02-17 MED ORDER — MAGNESIUM SULFATE 2 GM/50ML IV SOLN
2.0000 g | Freq: Once | INTRAVENOUS | Status: AC
  Administered 2020-02-17: 2 g via INTRAVENOUS
  Filled 2020-02-17: qty 50

## 2020-02-17 MED ORDER — ADULT MULTIVITAMIN LIQUID CH
15.0000 mL | Freq: Every day | ORAL | Status: DC
  Filled 2020-02-17 (×2): qty 15

## 2020-02-17 NOTE — Progress Notes (Addendum)
Subjective: More alert today. Follows commands. No further seizures.   Objective: Current vital signs: BP (!) 179/93   Pulse 78   Temp 98.3 F (36.8 C) (Axillary)   Resp (!) 25   Ht 5\' 10"  (1.778 m)   Wt 59.5 kg   SpO2 100%   BMI 18.82 kg/m  Vital signs in last 24 hours: Temp:  [97.7 F (36.5 C)-98.3 F (36.8 C)] 98.3 F (36.8 C) (10/10 0800) Pulse Rate:  [67-87] 78 (10/10 0900) Resp:  [20-27] 25 (10/10 0900) BP: (153-179)/(83-97) 179/93 (10/10 0900) SpO2:  [100 %] 100 % (10/10 0900)  Intake/Output from previous day: 10/09 0701 - 10/10 0700 In: 1244.1 [IV Piggyback:1244.1] Out: 1625 [Urine:1450; Stool:175] Intake/Output this shift: No intake/output data recorded. Nutritional status:  Diet Order            Diet NPO time specified  Diet effective now                Physical Exam GEN: calm and no acute distress HEENT-sutures c/d/i to right forehead laceration noted, now bruising noted with some mild swelling at right eye Lungs-Extubated, lungs clear, no sob, mouth breathing, very dry mouth Extremities-No cyanosis or pallor  Neurological Examination Mental Status:Alert, attends, follows simple commands very slowly, he attempts to speak, says name. Not oriented to location or situation. Able to count 5 fingers. Speech is otherwise only a whisper and its difficult to make out anything further (noted he is mouth breathing and his mouth is quite dry).  Cranial Nerves: Rt eye swollen, with sutures over right brow but now open. PERRL, EOMI bilaterally. Face symmetric.  Motor: Diffusely weak, unable to lift legs antigravity, but able to equally plantar flex with feet and wiggle toes on command. Grips equally and weakly with 3/5 strength.  Sensory:Seems to be equal response to LT on bilat feet/LE, shakes head "yes" that he can feel LT throughout rest of body, but this may be unreliable. Deep Tendon Reflexes:1+ Cerebellar:Unable to test, too gen weak and unable to  preform complex commands at this time  Lab Results: Results for orders placed or performed during the hospital encounter of 02/13/20 (from the past 48 hour(s))  Glucose, capillary     Status: Abnormal   Collection Time: 02/15/20 11:34 AM  Result Value Ref Range   Glucose-Capillary 113 (H) 70 - 99 mg/dL    Comment: Glucose reference range applies only to samples taken after fasting for at least 8 hours.  Glucose, capillary     Status: Abnormal   Collection Time: 02/15/20  3:40 PM  Result Value Ref Range   Glucose-Capillary 121 (H) 70 - 99 mg/dL    Comment: Glucose reference range applies only to samples taken after fasting for at least 8 hours.  Glucose, capillary     Status: Abnormal   Collection Time: 02/15/20  7:38 PM  Result Value Ref Range   Glucose-Capillary 124 (H) 70 - 99 mg/dL    Comment: Glucose reference range applies only to samples taken after fasting for at least 8 hours.  Glucose, capillary     Status: Abnormal   Collection Time: 02/15/20 11:37 PM  Result Value Ref Range   Glucose-Capillary 142 (H) 70 - 99 mg/dL    Comment: Glucose reference range applies only to samples taken after fasting for at least 8 hours.  Glucose, capillary     Status: None   Collection Time: 02/16/20  3:40 AM  Result Value Ref Range   Glucose-Capillary 86 70 -  99 mg/dL    Comment: Glucose reference range applies only to samples taken after fasting for at least 8 hours.  Triglycerides     Status: Abnormal   Collection Time: 02/16/20  7:29 AM  Result Value Ref Range   Triglycerides 178 (H) <150 mg/dL    Comment: Performed at [facility] [address], [place], [place] [address]  CBC     Status: Abnormal   Collection Time: 02/16/20  7:29 AM  Result Value Ref Range   WBC 9.4 4.0 - 10.5 K/uL   RBC 3.01 (L) 4.22 - 5.81 MIL/uL   Hemoglobin 8.5 (L) 13.0 - 17.0 g/dL   HCT 26.7 (L) 39 - 52 %   MCV 88.7 80.0 - 100.0 fL   MCH 28.2 26.0 - 34.0 pg   MCHC 31.8 30.0 - 36.0 g/dL   RDW 16.4  (H) 11.5 - 15.5 %   Platelets 289 150 - 400 K/uL   nRBC 0.0 0.0 - 0.2 %    Comment: Performed at [facility], [place] [address], [place], [place] [address]  Basic metabolic panel     Status: Abnormal   Collection Time: 02/16/20  7:29 AM  Result Value Ref Range   Sodium 140 135 - 145 mmol/L   Potassium 3.8 3.5 - 5.1 mmol/L   Chloride 105 98 - 111 mmol/L   CO2 24 22 - 32 mmol/L   Glucose, Bld 128 (H) 70 - 99 mg/dL    Comment: Glucose reference range applies only to samples taken after fasting for at least 8 hours.   BUN 14 8 - 23 mg/dL   Creatinine, Ser 1.23 0.61 - 1.24 mg/dL   Calcium 8.7 (L) 8.9 - 10.3 mg/dL   GFR, Estimated 58 (L) >60 mL/min   Anion gap 11 5 - 15    Comment: Performed at [facility] [address] [place], [place] [address]  Magnesium     Status: None   Collection Time: 02/16/20  7:29 AM  Result Value Ref Range   Magnesium 1.7 1.7 - 2.4 mg/dL    Comment: Performed at [facility] [address], [place],  [address]  Phosphorus     Status: None   Collection Time: 02/16/20  7:29 AM  Result Value Ref Range   Phosphorus 2.8 2.5 - 4.6 mg/dL    Comment: Performed at [facility] [address] [place], [place] [address]  Glucose, capillary     Status: Abnormal   Collection Time: 02/16/20  7:53 AM  Result Value Ref Range   Glucose-Capillary 116 (H) 70 - 99 mg/dL    Comment: Glucose reference range applies only to samples taken after fasting for at least 8 hours.  Iron and TIBC     Status: Abnormal   Collection Time: 02/16/20 10:19 AM  Result Value Ref Range   Iron 51 45 - 182 ug/dL   TIBC 321 250 - 450 ug/dL   Saturation Ratios 16 (L) 17.9 - 39.5 %   UIBC 270 ug/dL    Comment: Performed at [facility], [place] [address] [place],  [address]  Vitamin B12     Status: Abnormal   Collection Time: 02/16/20 10:19 AM  Result Value Ref Range   Vitamin B-12 146 (L) 180 - 914 pg/mL    Comment: (NOTE) This assay is not  validated for testing neonatal or myeloproliferative syndrome specimens for Vitamin B12 levels. Performed at [facility], [place] [address], [place], [place] [address]   Folate, serum, performed at [facility] lab     Status: None   Collection Time: 02/16/20 10:19 AM  Result Value Ref Range   Folate 6.5 >5.9 ng/mL    Comment: Performed at [facility], [place] [address] [place], [place] [address]  I-STAT 7, (LYTES, BLD GAS, ICA, H+H)     Status: Abnormal   Collection Time: 02/16/20 11:16 AM  Result Value Ref Range   pH, Arterial 7.506 (H) 7.35 - 7.45   pCO2 arterial 28.5 (L) 32 - 48 mmHg   pO2, Arterial 81 (L) 83 - 108 mmHg   Bicarbonate 22.7 20.0 - 28.0 mmol/L   TCO2 24 22 - 32 mmol/L   O2 Saturation 97.0 %   Acid-Base Excess 0.0 0.0 - 2.0 mmol/L   Sodium 141 135 - 145 mmol/L   Potassium 3.6 3.5 - 5.1 mmol/L   Calcium, Ion 1.22 1.15 - 1.40 mmol/L   HCT 25.0 (L) 39 - 52 %   Hemoglobin 8.5 (L) 13.0 - 17.0 g/dL   Patient temperature 97.5 F    Collection site Radial    Drawn by Operator    Sample type ARTERIAL   Glucose, capillary     Status: Abnormal   Collection Time: 02/16/20 12:05 PM  Result Value Ref Range   Glucose-Capillary 113 (H) 70 - 99 mg/dL    Comment: Glucose reference range applies only to samples taken after fasting for at least 8 hours.  Glucose, capillary     Status: Abnormal   Collection Time: 02/16/20  3:51 PM  Result Value Ref Range   Glucose-Capillary 110 (H) 70 - 99 mg/dL    Comment: Glucose reference range applies only to samples taken after fasting for at least 8 hours.  Glucose, capillary     Status: Abnormal   Collection Time: 02/16/20  8:07 PM  Result Value Ref Range   Glucose-Capillary 117 (H) 70 - 99 mg/dL    Comment: Glucose reference range applies only to samples taken after fasting for at least 8 hours.  Glucose, capillary     Status: Abnormal   Collection Time: 02/16/20 11:59 PM  Result Value Ref Range   Glucose-Capillary 114 (H) 70  - 99 mg/dL    Comment: Glucose reference range applies only to samples taken after fasting for at least 8 hours.  Triglycerides     Status: Abnormal   Collection Time: 02/17/20  3:29 AM  Result Value Ref Range   Triglycerides 181 (H) <150 mg/dL    Comment: Performed at [facility] [address], [place] [address]  CBC     Status: Abnormal   Collection Time: 02/17/20  3:29 AM  Result Value Ref Range   WBC 7.6 4.0 - 10.5 K/uL   RBC 3.19 (L) 4.22 - 5.81 MIL/uL   Hemoglobin 8.9 (L) 13.0 - 17.0 g/dL   HCT 28.3 (L) 39 - 52 %   MCV 88.7 80.0 - 100.0 fL   MCH 27.9 26.0 - 34.0 pg   MCHC 31.4 30.0 - 36.0 g/dL   RDW 16.4 (H) 11.5 - 15.5 %   Platelets 303 150 - 400 K/uL   nRBC 0.0 0.0 - 0.2 %    Comment: Performed at [facility], [place] [address], [place], [place] [address]  Basic metabolic panel     Status: Abnormal   Collection Time: 02/17/20  3:29 AM  Result  Value Ref Range   Sodium 141 135 - 145 mmol/L   Potassium 3.8 3.5 - 5.1 mmol/L   Chloride 107 98 - 111 mmol/L   CO2 24 22 - 32 mmol/L   Glucose, Bld 106 (H) 70 - 99 mg/dL    Comment: Glucose reference range applies only to samples taken after fasting for at least 8 hours.   BUN 18 8 - 23 mg/dL   Creatinine, Ser 1.18 0.61 - 1.24 mg/dL   Calcium 9.1 8.9 - 10.3 mg/dL   GFR, Estimated >60 >60 mL/min   Anion gap 10 5 - 15    Comment: Performed at [facility] [address], [place], [place] [address]  Magnesium     Status: None   Collection Time: 02/17/20  3:29 AM  Result Value Ref Range   Magnesium 1.9 1.7 - 2.4 mg/dL    Comment: Performed at [facility] [address], [place], [place] [address]  Phosphorus     Status: None   Collection Time: 02/17/20  3:29 AM  Result Value Ref Range   Phosphorus 2.8 2.5 - 4.6 mg/dL    Comment: Performed at [facility] [address] [place], [place] [address]  Glucose, capillary     Status: None   Collection Time: 02/17/20  3:34 AM  Result Value  Ref Range   Glucose-Capillary 97 70 - 99 mg/dL    Comment: Glucose reference range applies only to samples taken after fasting for at least 8 hours.    Recent Results (from the past 240 hour(s))  Respiratory Panel by RT PCR (Flu A&B, Covid) - Nasopharyngeal Swab     Status: None   Collection Time: 02/13/20  4:17 PM   Specimen: Nasopharyngeal Swab  Result Value Ref Range Status   SARS Coronavirus 2 by RT PCR NEGATIVE NEGATIVE Final    Comment: (NOTE) SARS-CoV-2 target nucleic acids are NOT DETECTED.  The SARS-CoV-2 RNA is generally detectable in upper respiratoy specimens during the acute phase of infection. The lowest concentration of SARS-CoV-2 viral copies this assay can detect is 131 copies/mL. A negative result does not preclude SARS-Cov-2 infection and should not be used as the sole basis for treatment or other patient management decisions. A negative result may occur with  improper specimen collection/handling, submission of specimen other than nasopharyngeal swab, presence of viral mutation(s) within the areas targeted by this assay, and inadequate number of viral copies (<131 copies/mL). A negative result must be combined with clinical observations, patient history, and epidemiological information. The expected result is Negative.  Fact Sheet for Patients:  PinkCheek.be  Fact Sheet for Healthcare Providers:  GravelBags.it  This test is no t yet approved or cleared by the [place] FDA and  has been authorized for detection and/or diagnosis of SARS-CoV-2 by FDA under an Emergency Use Authorization (EUA). This EUA will remain  in effect (meaning this test can be used) for the duration of the COVID-19 declaration under Section 564(b)(1) of the Act, 21 U.S.C. section 360bbb-3(b)(1), unless the authorization is terminated or revoked sooner.     Influenza A by PCR NEGATIVE NEGATIVE Final   Influenza B by PCR  NEGATIVE NEGATIVE Final    Comment: (NOTE) The Xpert Xpress SARS-CoV-2/FLU/RSV assay is intended as an aid in  the diagnosis of influenza from Nasopharyngeal swab specimens and  should not be used as a sole basis for treatment. Nasal washings and  aspirates are unacceptable for Xpert Xpress SARS-CoV-2/FLU/RSV  testing.  Fact Sheet for Patients: PinkCheek.be  Fact Sheet for Healthcare Providers: GravelBags.it  This test is not yet approved or cleared by the [place] FDA and  has been authorized for detection and/or diagnosis of SARS-CoV-2 by  FDA under an Emergency Use Authorization (EUA). This EUA will remain  in effect (meaning this test can be used) for the duration of the  Covid-19 declaration under Section 564(b)(1) of the Act, 21  U.S.C. section 360bbb-3(b)(1), unless the authorization is  terminated or revoked. Performed at [facility], [place] [address], [place], [place] [address]   Culture, blood (Routine X 2) w Reflex to ID Panel     Status: None (Preliminary result)   Collection Time: 02/13/20  4:57 PM   Specimen: BLOOD LEFT FOREARM  Result Value Ref Range Status   Specimen Description BLOOD LEFT FOREARM  Final   Special Requests   Final    BOTTLES DRAWN AEROBIC AND ANAEROBIC Blood Culture results may not be optimal due to an excessive volume of blood received in culture bottles   Culture   Final    NO GROWTH 3 DAYS Performed at [facility], [place] [address], [place], [place] [address]    Report Status PENDING  Incomplete  Culture, blood (Routine X 2) w Reflex to ID Panel     Status: None (Preliminary result)   Collection Time: 02/13/20  5:02 PM   Specimen: BLOOD LEFT WRIST  Result Value Ref Range Status   Specimen Description BLOOD LEFT WRIST  Final   Special Requests   Final    BOTTLES DRAWN AEROBIC AND ANAEROBIC Blood Culture results may not be optimal due to an excessive volume of blood  received in culture bottles   Culture   Final    NO GROWTH 3 DAYS Performed at [facility], [place] [address], [place], [place] [address]    Report Status PENDING  Incomplete  MRSA PCR Screening     Status: Abnormal   Collection Time: 02/14/20  3:03 AM   Specimen: Nasal Mucosa; Nasopharyngeal  Result Value Ref Range Status   MRSA by PCR POSITIVE (A) NEGATIVE Final    Comment:        The GeneXpert MRSA Assay (FDA approved for NASAL specimens only), is one component of a comprehensive MRSA colonization surveillance program. It is not intended to diagnose MRSA infection nor to guide or monitor treatment for MRSA infections. RESULT CALLED TO, READ BACK BY AND VERIFIED WITH: Lillie Fragmin RN 8:25 02/14/20 ([email or web address]) Performed at [facility], [place] [address], [place], [place] [address]   CSF culture     Status: None   Collection Time: 02/14/20 11:01 AM   Specimen: CSF; Cerebrospinal Fluid  Result Value Ref Range Status   Specimen Description CSF  Final   Special Requests NONE  Final   Gram Stain   Final    WBC PRESENT,BOTH PMN AND MONONUCLEAR NO ORGANISMS SEEN CYTOSPIN SMEAR    Culture   Final    NO GROWTH 3 DAYS Performed at [facility], [place] [address], [place], [place] [address]    Report Status 02/17/2020 FINAL  Final    Lipid Panel Recent Labs    02/17/20 0329  TRIG 181*    Studies/Results: DG Abd 1 View  Result Date: 02/16/2020 CLINICAL DATA:  Encounter for nasogastric tube placement. EXAM: ABDOMEN - 1 VIEW COMPARISON:  None. FINDINGS: Nasogastric tube is identified with distal tip coiled in  the proximal stomach. Dilated air-filled small bowel loops are identified. IMPRESSION: Nasogastric tube is identified with distal tip coiled in the proximal stomach. Electronically Signed   By: Abelardo Diesel M.D.   On: 02/16/2020 14:45   CT HEAD WO CONTRAST  Result Date: 02/16/2020 CLINICAL DATA:  Seizure EXAM: CT HEAD WITHOUT CONTRAST TECHNIQUE: Contiguous  axial images were obtained from the base of the skull through the vertex without intravenous contrast. COMPARISON:  MRI 2 days ago.  CT 3 days ago. 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 No evidence of hemorrhagic transformation. Question mild residual low density in the medial left temporal lobe. No new area of brain abnormality is seen. Ventricular size is stable. No extra-axial collection. Vascular: No abnormal vascular finding. 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 No evidence of hemorrhagic transformation. Question mild residual low density in the medial left temporal lobe, where abnormal signal which shown by MRI 2 days ago. This could represent either postictal change or ischemic change. Electronically Signed   By: Nelson Chimes M.D.   On: 02/16/2020 12:00   EEG adult  Result Date: 02/16/2020 Lora Havens, MD     02/16/2020  3:53 PM Patient Name:Howard Allen WCH:[id number] Epilepsy Attending:Priyanka Barbra Sarks Referring Physician/Provider:Dr Margaretmary Bayley basaraba Date:02/16/2020 Duration:25.30 mins  Patient PNTIRWE:31VQ M with new onset status epilepticus, EEG to evaluate for seizure.  Level of alertness:awake  AEDs during EEG study:LEV, versed, propofol  Technical aspects: This EEG study was done with scalp electrodes positioned according to the 10-20 International system of electrode placement. Electrical activity was acquired at a sampling rate of 500Hz  and reviewed with a high frequency filter of 70Hz  and a low frequency filter of 1Hz . EEG data were recorded continuously and digitally stored.  Description: No posterior dominant rhythm was seen. EEG showed continuous generalized maximal bifrontal 15-18 Hz beta activity. Hyperventilation and photic stimulation were not performed.   ABNORMALITY  -Excessive beta, generalized  IMPRESSION: This study is within normal limits.No seizures or epileptiform discharges were seen throughout the recording. The excessive beta activity can be seen with medication use like benzodiazepines and is a benign eeg pattern.  Lora Havens  ECHOCARDIOGRAM COMPLETE BUBBLE STUDY  Result Date: 02/15/2020    ECHOCARDIOGRAM REPORT   Patient Name:   Howard Allen Date of Exam: 02/15/2020 Medical Rec #:  [id number]         Height:       70.0 in Accession #:    [id number]        Weight:       131.2 lb Date of Birth:  Jan 31, 1946         BSA:          1.745 m Patient Age:    74 years          BP:           110/70 mmHg Patient Gender: M                 HR:           66 bpm. Exam Location:  Inpatient Procedure: 2D Echo, Cardiac Doppler, Color Doppler and Saline Contrast Bubble            Study Indications:    Stroke  History:        Patient has no prior history of Echocardiogram  examinations.                 Risk Factors:Hypertension and Dyslipidemia.  Sonographer:    Clayton Lefort RDCS (AE) Referring Phys: [id number] [place]  1. Left ventricular ejection fraction, by estimation, is 55 to 60%. The left ventricle has normal function. The left ventricle has no regional wall motion abnormalities. There is moderate left ventricular hypertrophy. Left ventricular diastolic parameters are consistent with Grade I diastolic dysfunction (impaired relaxation).  2. Right ventricular systolic function is normal. The right ventricular size is normal.  3. Right atrial size was mildly dilated.  4. The mitral valve is normal in structure. Mild mitral valve regurgitation. No evidence of mitral stenosis.  5. The aortic valve is normal in structure. Aortic valve regurgitation is not visualized. No aortic stenosis is present. FINDINGS  Left Ventricle: Left ventricular ejection fraction, by estimation, is 55 to 60%. The left ventricle has normal function. The left ventricle has no regional  wall motion abnormalities. The left ventricular internal cavity size was normal in size. There is  moderate left ventricular hypertrophy. Left ventricular diastolic parameters are consistent with Grade I diastolic dysfunction (impaired relaxation). Right Ventricle: The right ventricular size is normal. No increase in right ventricular wall thickness. Right ventricular systolic function is normal. Left Atrium: Left atrial size was normal in size. Right Atrium: Right atrial size was mildly dilated. Pericardium: There is no evidence of pericardial effusion. Mitral Valve: The mitral valve is normal in structure. Mild mitral valve regurgitation. No evidence of mitral valve stenosis. MV peak gradient, 5.6 mmHg. The mean mitral valve gradient is 2.0 mmHg. Tricuspid Valve: The tricuspid valve is normal in structure. Tricuspid valve regurgitation is trivial. Aortic Valve: The aortic valve is normal in structure. Aortic valve regurgitation is not visualized. No aortic stenosis is present. Aortic valve mean gradient measures 4.0 mmHg. Aortic valve peak gradient measures 6.9 mmHg. Aortic valve area, by VTI measures 2.37 cm. Pulmonic Valve: The pulmonic valve was normal in structure. Pulmonic valve regurgitation is not visualized. No evidence of pulmonic stenosis. Aorta: The aortic root and ascending aorta are structurally normal, with no evidence of dilitation. IAS/Shunts: The atrial septum is grossly normal. Agitated saline contrast was given intravenously to evaluate for intracardiac shunting.  LEFT VENTRICLE PLAX 2D LVIDd:         4.20 cm  Diastology LVIDs:         2.20 cm  LV e' medial:    5.98 cm/s LV PW:         1.60 cm  LV E/e' medial:  12.7 LV IVS:        1.50 cm  LV e' lateral:   8.70 cm/s LVOT diam:     1.80 cm  LV E/e' lateral: 8.7 LV SV:         60 LV SV Index:   34 LVOT Area:     2.54 cm  RIGHT VENTRICLE             IVC RV Basal diam:  4.00 cm     IVC diam: 1.00 cm RV Mid diam:    3.00 cm RV S prime:     14.50  cm/s TAPSE (M-mode): 2.5 cm LEFT ATRIUM             Index       RIGHT ATRIUM           Index LA diam:        3.80 cm 2.18  cm/m  RA Area:     18.70 cm LA Vol (A2C):   40.7 ml 23.33 ml/m RA Volume:   54.00 ml  30.95 ml/m LA Vol (A4C):   39.7 ml 22.75 ml/m LA Biplane Vol: 43.8 ml 25.10 ml/m  AORTIC VALVE AV Area (Vmax):    2.21 cm AV Area (Vmean):   2.00 cm AV Area (VTI):     2.37 cm AV Vmax:           131.00 cm/s AV Vmean:          90.100 cm/s AV VTI:            0.252 m AV Peak Grad:      6.9 mmHg AV Mean Grad:      4.0 mmHg LVOT Vmax:         114.00 cm/s LVOT Vmean:        70.700 cm/s LVOT VTI:          0.235 m LVOT/AV VTI ratio: 0.93  AORTA Ao Root diam: 3.80 cm Ao Asc diam:  3.50 cm MITRAL VALVE                TRICUSPID VALVE MV Area (PHT): 2.80 cm     TR Peak grad:   28.5 mmHg MV Peak grad:  5.6 mmHg     TR Vmax:        267.00 cm/s MV Mean grad:  2.0 mmHg MV Vmax:       1.18 m/s     SHUNTS MV Vmean:      64.2 cm/s    Systemic VTI:  0.24 m MV Decel Time: 271 msec     Systemic Diam: 1.80 cm MV E velocity: 75.80 cm/s MV A velocity: 102.00 cm/s MV E/A ratio:  0.74 Mertie Moores MD Electronically signed by Mertie Moores MD Signature Date/Time: 02/15/2020/2:14:28 PM    Final     Medications:  Scheduled: . amLODIPine  2.5 mg Per Tube Daily  . aspirin  81 mg Per Tube Daily  . atorvastatin  80 mg Per Tube Daily  . Chlorhexidine Gluconate Cloth  6 each Topical Daily  . clopidogrel  75 mg Per Tube Daily  . dexamethasone (DECADRON) injection  10 mg Intravenous Q6H  . docusate  100 mg Per Tube BID  . feeding supplement (PROSource TF)  45 mL Per Tube Daily  . heparin  5,000 Units Subcutaneous Q8H  . insulin aspart  0-9 Units Subcutaneous Q4H  . multivitamin  15 mL Oral Daily  . mupirocin ointment  1 application Nasal BID  . pantoprazole (PROTONIX) IV  40 mg Intravenous Daily  . polyethylene glycol  17 g Per Tube Daily  . potassium chloride  40 mEq Per Tube Daily  . vitamin B-12  1,000 mcg Per Tube  Daily   Continuous: . sodium chloride 20 mL/hr at 02/15/20 0400  . acyclovir 690 mg (02/17/20 0900)  . ampicillin (OMNIPEN) IV 300 mL/hr at 02/17/20 0600  . cefTRIAXone (ROCEPHIN)  IV 2 g (02/17/20 0901)  . feeding supplement (VITAL AF 1.2 CAL) 60 mL/hr at 02/15/20 0400  . levETIRAcetam Stopped (02/17/20 0536)  . norepinephrine (LEVOPHED) Adult infusion Stopped (02/13/20 1919)  . propofol (DIPRIVAN) infusion Stopped (02/15/20 0834)  . vancomycin Stopped (02/17/20 0405)   TTE (10/8): 1. Left ventricular ejection fraction, by estimation, is 55 to 60%. The  left ventricle has normal function. The left ventricle has no regional  wall motion abnormalities. There is moderate left ventricular  hypertrophy.  Left ventricular diastolic parameters are consistent with Grade I diastolic dysfunction (impaired  relaxation).  2. Right ventricular systolic function is normal. The right ventricular  size is normal.  3. Right atrial size was mildly dilated.  4. The mitral valve is normal in structure. Mild mitral valve  regurgitation. No evidence of mitral stenosis.  5. The aortic valve is normal in structure. Aortic valve regurgitation is  not visualized. No aortic stenosis is present.   Imaging MRI brain with and without contrast 02/14/2020:Signal abnormality in the left thalamus most consistent with an acute infarct. Signal abnormality in the mesial left temporal lobe and splenium of the corpus callosum which may reflect acute infarct, sequelae of recent seizure activity, or encephalitis (including herpes encephalitis). No contralateral cerebral involvement.    Neurology NP participating in the care of our patient today: Desiree Metzger-Cihelka, ARNP-C, ANVP-BC Pager: [phone]  Assessment: 74 year old male who presented with new onset status epilepticus. Treated with Keppra bolus, Ativan and Versed. Did req intubation in ER, but now extubated.  1. New onset status epilepticus  (resolved)- likely etiology d/t meningitis 2. Acute left thalamic lesion on MRI, in conjunction with left hippocampal acute signal abnormality. On review of MRI by Dr. Hortense Ramal and Dr. Cheral Marker, the MRI changes are most likely related to patient's seizure, less likely that this is an acute infarct given atypical location as well as patient's clinical presentation. Several case reports in the literature detailing MRI changes in status epilepticus were found with similar patterns of hippocampal hyperintensity with ipsilateral focus of thalamic hyperintensity. Will need to consider repeat MRI to confirm interval changes more consistent with cytotoxic edema from prolonged seizure rather than stroke, in 2-3 mo. No TPA administered as patient presented with seizures and no last known normal. Routine stroke measures for secondary prevention in case this is infarct as we will not know for sure till down the line.  3. Fever and leukocytosis- LP showed 140 WBCs with 64% neutrophils and 32% lymphocytes, protein 112 and glucose less than 20 consistent with meningitis. IV Abx started. Meningitis may have been the condition precipitating the patient's status epilepticus.  4. Multiple falls w/injury- right side of eye with sutures. Falls likely due to seizures prior to admit 5. TTE essentially unremarkable from the standpoint of structural stroke risk factors.  6. Improving Neurological exam.    Recommendations: -On antibiotics and acyclovir per critical care team -Continue Keppra 1000 mg twice daily IV, may switch to PO once able -Seizure precautions -Started on aspirin and Plavix for 3 weeks, then monotherapy with ASA only.  -Will need repeat MRI brain as an outpatient in 8 to 12 weeks to see if MRI changes resolve which would make it more likely that these were seizure-related. -Obtain CTA of head and neck to complete stroke work up. Likely to be low-yield given that the signal abnormalities on MRI are felt more  likely to be due to prolonged seizure activity rather than stroke (see #2 in Assessment above).  -Will need outpatient Neurology follow up.  -Neurohospitalist service will sign off. Please call us if CTA is positive or with any additional questions.     LOS: 4 days    35 minutes spent in the neurological evaluation and management of this critically ill patient.   Electronically signed: Dr. Kerney Elbe

## 2020-02-17 NOTE — Progress Notes (Signed)
  Speech Language Pathology Treatment: Dysphagia  Patient Details Name: Howard Allen MRN: [id number] DOB: 1945/11/16 Today's Date: 02/17/2020 Time: 6578-4696 SLP Time Calculation (min) (ACUTE ONLY): 18 min  Assessment / Plan / Recommendation Clinical Impression  ST follow up to assess for PO readiness.  The patient's son was present at the bedside.  Of note, the patient is from [place].  Cranial nerve exam was attempted and the patient was unable to consistently follow commands.  Lingual range of motion appeared adequate.  Other structures were unable to be assessed.  The patient was noted to shake his head yes in response to all questions.  He was accepting of oral care and it was completed using suction swabs.  When presented with ice chips and thin liquids via spoon no oral manipulation was observed.  The ice chip was noted to fall from his oral cavity.  Same was noted for teaspoon sip of thin liquids.  Oral cavity was suctioned to remove any left over material.  Recommend that he remain NPO.  ST will continue to follow for PO readiness.   HPI HPI: 74 y.o. 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        SLP Plan  Continue with current plan of care       Recommendations  Diet recommendations: NPO Medication Administration: Via alternative means                Oral Care Recommendations: Oral care QID;Staff/trained caregiver to provide oral care Follow up Recommendations: Other (comment) (TBD) SLP Visit Diagnosis: Dysphagia, unspecified (R13.10) Plan: Continue with current plan of care       Shelly, MA, CCC-SLP Acute Rehab  SLP [place] 02/17/2020, 10:18 AM

## 2020-02-17 NOTE — Progress Notes (Signed)
Pharmacy Antibiotic Note  Howard Allen is a 74 y.o. male admitted on 02/13/2020 with seizures and now with concern for meningitis. Pharmacy has been consulted for Vancomycin dosing along with Acyclovir + Ampicillin + Rocephin per MD.   A Vancomycin trough this afternoon is subtherapeutic (VT = 13; goal of 15-20 mcg/ml). The patient is noted to have resolving AKI (baseline unk) - SCr down to 1.18, CrCl~40-50 ml/min.   Plan: -change vancomycin to 750 mg IV q12h -Will follow renal function, cultures and clinical progress   Height: 5\' 10"  (177.8 cm) Weight: 59.5 kg (131 lb 2.8 oz) IBW/kg (Calculated) : 73  Temp (24hrs), Avg:98.1 F (36.7 C), Min:97.7 F (36.5 C), Max:98.9 F (37.2 C)  Recent Labs  Lab 02/13/20 1617 02/13/20 1617 02/13/20 1926 02/13/20 1930 02/13/20 1935 02/14/20 0418 02/15/20 0322 02/16/20 0729 02/17/20 0329 02/17/20 1530  WBC 17.3*  --   --   --   --  9.0 6.2 9.4 7.6  --   CREATININE 1.91*   < >  --  1.71*  --  1.44* 1.19 1.23 1.18  --   LATICACIDVEN  --   --  1.9  --  2.2*  --   --   --   --   --   VANCOTROUGH  --   --   --   --   --   --   --   --   --  13*   < > = values in this interval not displayed.    Estimated Creatinine Clearance: 47.6 mL/min (by C-G formula based on SCr of 1.18 mg/dL).    Allergies  Allergen Reactions  . Lisinopril Anaphylaxis, Cough and Other (See Comments)    Other reaction(s): anaphylaxis Pt states it made him "cough his head off"   . Lorazepam     Other reaction(s): Hallucinations    Antimicrobials this admission: Vanc 10/7 >> Ampicillin 10/7 >> Acyclovir 10/7 >> Rocephin 10/7 >>  Dose adjustments this admission: 10/10: VT= 13 on 500mg  IV q12h; change to 750mg  IV q12h  Microbiology results: 10/6 Fluvid >> neg 10/7 MRSA PCR >> positive 10/7 CSF cx >> [id number]  Thank you for allowing pharmacy to be a part of this patient's care.  Hildred Laser, PharmD Clinical Pharmacist **Pharmacist phone directory can now  be found on [email or web address] ([email or web address]).  Listed under [place].

## 2020-02-17 NOTE — Progress Notes (Signed)
Pharmacy Electrolyte Replacement  Recent Labs:  Recent Labs    02/17/20 0329  K 3.8  MG 1.9  PHOS 2.8  CREATININE 1.18    Low Critical Values (K </= 2.5, Phos </= 1, Mg </= 1) Present: None   Plan:  - Mg 1.9 - supplement with Mg 2g IV x 1 - F/u Mg w/ AM labs  Thank you for allowing pharmacy to be a part of this patient's care.  Alycia Rossetti, PharmD, BCPS Clinical Pharmacist Clinical phone for 02/17/2020: [phone] 02/17/2020 1:41 PM   **Pharmacist phone directory can now be found on [email or web address] ([email or web address]).  Listed under [place].

## 2020-02-17 NOTE — Progress Notes (Addendum)
NAME:  Howard Allen, MRN:  [id number], DOB:  [birth year], LOS: 4 ADMISSION DATE:  02/13/2020, CONSULTATION DATE:  02/13/2020 REFERRING MD:  Cheral Marker, CHIEF COMPLAINT:  Seizure   Brief History   74 y.o. 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  CT head negative. PCCM consulted for admission.  History of present illness   Howard Allen is a 74 y.o. M with PMH of  Crohn's Disease, HTN, HL, Depressio, hospitalization in 10/2019 for pneumatosis s/p ileocolic resection and lysis of adhesions who has had mulitple recent seizures and was treated at outside hospitals. Pt was brought in by EMS for at least three witnessed generalized tonic-clonic seizures today.  He presented unresponsive and was intubated on arrival.   Past Medical History  Crohn's Disease and SBO, HTN, HL, Depression  Significant Hospital Events   10/6 >> Admitted to [facility]  Consults:  Neurology  Procedures:  10/6 >> ETT placed 10/7 >> LP 10/8 >> Extubated   Significant Diagnostic Tests:  10/6 CT Head >> No acute intracranial abnormalities; age-related volume loss and chronic small vessel disease 10/9 CT Head >> Progressive low density in the left thalamus related to acute infarction; no hemorrhage 10/9 EEG >> Negative  Micro Data:  10/6 COVID-19 + influenza >> Negative  10/6 Blood Cultures >> NGTD 10/7 CSF Culture >> NGTD 10/7 HSV serology >> 10/7 West Nile serology >>  10/7 VRDL serology >> 10/8 Quanitferon Gold  >>   Antimicrobials:  Vancomycin 10/7 >> Ceftriaxone 10/7 >> Ampicillin 10/7 >> Acyclovir 10/7 >>  Interim history/subjective:   Patient tolerated placement of NGT yesterday. No overnight events.   This morning, Mr. Howard Allen denies any pain at this time. He is not willing to open his eyes due to pain from the light when he does though. He will answer questions by shaking his head but only  intermittently follow commands.   Objective   Blood pressure (!) 172/88, pulse 70, temperature 97.7 F (36.5 C), temperature source Axillary, resp. rate (!) 24, height 5\' 10"  (1.778 m), weight 59.5 kg, SpO2 100 %.        Intake/Output Summary (Last 24 hours) at 02/17/2020 0812 Last data filed at 02/17/2020 0600 Gross per 24 hour  Intake 1244.06 ml  Output 1625 ml  Net -380.94 ml   Filed Weights   02/13/20 1715 02/15/20 0500  Weight: 68.9 kg 59.5 kg   Physical Exam Vitals and nursing note reviewed.  Constitutional:      Appearance: He is underweight. He is not diaphoretic.     Comments: Still lethargic but improved compared to yesterday  HENT:     Head: Normocephalic and atraumatic.  Cardiovascular:     Rate and Rhythm: Normal rate and regular rhythm.     Heart sounds: No murmur heard.   Pulmonary:     Effort: Pulmonary effort is normal. No respiratory distress.     Breath sounds: No wheezing or rales.  Abdominal:     General: Bowel sounds are normal. There is no distension.     Palpations: Abdomen is soft.     Tenderness: There is no abdominal tenderness. There is no guarding.  Musculoskeletal:     Right lower leg: No edema.     Left lower leg: No edema.  Skin:    General: Skin is warm and dry.     Findings: No erythema, lesion or rash.  Neurological:  General: No focal deficit present.     Cranial Nerves: No facial asymmetry.     Comments: Answers questions appropriately by shaking his eye, but will only intermittently follow commands. Able to move all extremities against gravity.     Resolved Hospital Problem list   AKI Acute Respiratory Failure  Assessment & Plan:   # Meningitis  - LP performed on 10/7 consistent with meningitis - Cultures are NGTD at < 24 hours; HSV, VDLR, and West nile serology pending. Crypto negative. Quantiferon gold pending.  - Continue Vancomycin, Ceftriaxone, Ampicillin, Acyclovir, Decadron (Day 4) - Mental Status improving  some today.   # Acute Ischemic Infarct  - Seen on MRI imaging on 10/7: left thalamic signal abnormality measuring 2.3 cm most consistent with acute infarct; no edema or hemorrhagic conversion.  - Continue DAPT with Aspirin and Plavix - TTE negative for vegetations or thrombosis.  - Continue high intensity statin; Lipid panel not obtained this morning, will add on for tomorrow morning   # Tonic-Clonic Seizures # Status Epilepticus: Resolved - Secondary to meningitis  - Neurology on board; appreciate their recommendations - Continue Keppra at this time and PRN Ativan in case of recurrent seizure   # Hypokalemia  # Hypomagnesemia  # Hypophosphoremia # B12 Deficiency  # Protein Calorie Deficiency - Likely due to malnutrition and anorexia, possibly worsened by malabsorption given bowel inflammation seen on imaging the past several months prior to resection. Of note, 70 lb weight loss in the past 10 months.  - Monitor K, Mg, and P daily - Replenish PRN; scheduled potassium repletion added  - Vitamin B12 1000 mcg daily  - Added liquid multi-vitamin  - Consult to dietician for tube feed initiation   # HTN # HLD - Home medications include Toprol 100 mg, Amlodipine 2.5 mg, Lipitor 20 mg, Fenofibrate 160 mg  - Blood Pressure continues to increase this AM, will plan to restart home BP medications slowly - Start Amlodipine 2.5 mg daily - Labetalol 5 mg q2h PRN for SBP > 180 or DBP > 110  # Normocytic Anemia  # Hx of Iron Deficiency Anemia # B12 Deficiency  - B12 levels low. Iron panel shows low saturation, however normal iron and TIBC, unclear picture as not consistent completely with iron deficiency anemia or anemia of chronic disease. - Hemoglobin is stable at this time.  - Will continue to monitor and transfuse if hemoglobin < 7.0   # ? Crohn's Disease s/p Ileocolic Resection  - No longer on immunosuppressive therapy.   Best practice:  Diet: Tube Feeds DVT prophylaxis: Heparin GI  prophylaxis: Protonix Glucose control: N/A Mobility: Bedrest Code Status: FULL Family Communication: Son updated at bedside  Disposition: ICU  Labs   CBC: Recent Labs  Lab 02/13/20 1617 02/13/20 1749 02/14/20 0418 02/15/20 0322 02/16/20 0729 02/16/20 1116 02/17/20 0329  WBC 17.3*  --  9.0 6.2 9.4  --  7.6  NEUTROABS 14.4*  --   --   --   --   --   --   HGB 10.9*   < > 9.1* 8.2* 8.5* 8.5* 8.9*  HCT 36.3*   < > 27.4* 24.4* 26.7* 25.0* 28.3*  MCV 95.8  --  87.3 85.9 88.7  --  88.7  PLT 461*  --  281 256 289  --  303   < > = values in this interval not displayed.    Basic Metabolic Panel: Recent Labs  Lab 02/13/20 1617 02/13/20 1749 02/13/20 1930 02/14/20  7902 02/15/20 0322 02/16/20 0729 02/16/20 1116 02/17/20 0329  NA 137   < >  --  138 135 140 141 141  K 2.8*   < >  --  3.4* 3.3* 3.8 3.6 3.8  CL 96*  --   --  103 102 105  --  107  CO2 15*  --   --  21* 21* 24  --  24  GLUCOSE 333*  --   --  102* 164* 128*  --  106*  BUN 19  --   --  17 14 14   --  18  CREATININE 1.91*   < > 1.71* 1.44* 1.19 1.23  --  1.18  CALCIUM 8.9  --   --  8.7* 8.3* 8.7*  --  9.1  MG  --   --  1.0* 1.4* 1.8 1.7  --  1.9  PHOS  --   --  1.5* 1.6* 3.1 2.8  --  2.8   < > = values in this interval not displayed.   GFR: Estimated Creatinine Clearance: 47.6 mL/min (by C-G formula based on SCr of 1.18 mg/dL). Recent Labs  Lab 02/13/20 1617 02/13/20 1926 02/13/20 1935 02/14/20 0418 02/15/20 0322 02/16/20 0729 02/17/20 0329  WBC   < >  --   --  9.0 6.2 9.4 7.6  LATICACIDVEN  --  1.9 2.2*  --   --   --   --    < > = values in this interval not displayed.    Liver Function Tests: Recent Labs  Lab 02/13/20 1617 02/15/20 0322  AST 32 20  ALT 17 14  ALKPHOS 31* 21*  BILITOT 0.5 0.5  PROT 6.7 5.1*  ALBUMIN 3.7 2.5*   No results for input(s): LIPASE, AMYLASE in the last 168 hours. No results for input(s): AMMONIA in the last 168 hours.  ABG    Component Value Date/Time   PHART  7.506 (H) 02/16/2020 1116   PCO2ART 28.5 (L) 02/16/2020 1116   PO2ART 81 (L) 02/16/2020 1116   HCO3 22.7 02/16/2020 1116   TCO2 24 02/16/2020 1116   ACIDBASEDEF 2.0 02/13/2020 1749   O2SAT 97.0 02/16/2020 1116     Coagulation Profile: No results for input(s): INR, PROTIME in the last 168 hours.  Cardiac Enzymes: Recent Labs  Lab 02/13/20 1930  CKTOTAL 128    HbA1C: Hgb A1c MFr Bld  Date/Time Value Ref Range Status  02/13/2020 07:30 PM 5.3 4.8 - 5.6 % Final    Comment:    (NOTE) Pre diabetes:          5.7%-6.4%  Diabetes:              >6.4%  Glycemic control for   <7.0% adults with diabetes     CBG: Recent Labs  Lab 02/16/20 1205 02/16/20 1551 02/16/20 2007 02/16/20 2359 02/17/20 0334  GLUCAP 113* 110* 117* 114* 97    Review of Systems:   Negative except as noted above  Past Medical History  He,  has no past medical history on file.   Surgical History     Social History      Family History   His family history is not on file.   Allergies Allergies  Allergen Reactions  . Lisinopril Anaphylaxis, Cough and Other (See Comments)    Other reaction(s): anaphylaxis Pt states it made him "cough his head off"   . Lorazepam     Other reaction(s): Hallucinations     Home Medications  Prior to Admission medications   Medication Sig Start Date End Date Taking? Authorizing Provider  albuterol (VENTOLIN HFA) 108 (90 Base) MCG/ACT inhaler Inhale 2 puffs into the lungs every 6 (six) hours as needed for wheezing or shortness of breath.  08/27/19  Yes [provider]  amLODipine (NORVASC) 2.5 MG tablet Take 2.5 mg by mouth daily. 01/30/20  Yes [provider]  atorvastatin (LIPITOR) 20 MG tablet Take 20 mg by mouth daily.  01/30/20  Yes [provider]  busPIRone (BUSPAR) 15 MG tablet Take 15 mg by mouth 2 (two) times daily. 01/30/20  Yes [provider]  cyproheptadine (PERIACTIN) 4 MG tablet Take 4 mg by mouth 2 (two) times  daily. 01/30/20  Yes [provider]  fenofibrate 160 MG tablet Take 160 mg by mouth daily. 01/30/20  Yes [provider]  metoprolol succinate (TOPROL-XL) 100 MG 24 hr tablet Take 100 mg by mouth daily. 01/30/20  Yes [provider]  pantoprazole (PROTONIX) 20 MG tablet Take 20 mg by mouth daily. 01/30/20  Yes [provider]  budesonide (ENTOCORT EC) 3 MG 24 hr capsule Take 3 mg by mouth daily.    [provider]  doxazosin (CARDURA) 1 MG tablet Take 1 mg by mouth daily. 08/27/19   [provider]  hydrochlorothiazide (HYDRODIURIL) 25 MG tablet Take 25 mg by mouth daily. 08/28/19   [provider]  meclizine (ANTIVERT) 25 MG tablet Take 25 mg by mouth 2 (two) times daily as needed for dizziness.  10/12/19   [provider]     Dr. Jose Persia Internal Medicine PGY-2  Pager: [phone]  02/17/2020, 8:12 AM

## 2020-02-18 ENCOUNTER — Inpatient Hospital Stay (HOSPITAL_COMMUNITY): Payer: Medicare Other

## 2020-02-18 DIAGNOSIS — G40901 Epilepsy, unspecified, not intractable, with status epilepticus: Secondary | ICD-10-CM

## 2020-02-18 LAB — GLUCOSE, CAPILLARY
Glucose-Capillary: 101 mg/dL — ABNORMAL HIGH (ref 70–99)
Glucose-Capillary: 95 mg/dL (ref 70–99)
Glucose-Capillary: 117 mg/dL — ABNORMAL HIGH (ref 70–99)
Glucose-Capillary: 109 mg/dL — ABNORMAL HIGH (ref 70–99)
Glucose-Capillary: 93 mg/dL (ref 70–99)
Glucose-Capillary: 82 mg/dL (ref 70–99)

## 2020-02-18 LAB — BASIC METABOLIC PANEL
Anion gap: 15 (ref 5–15)
BUN: 20 mg/dL (ref 8–23)
CO2: 19 mmol/L — ABNORMAL LOW (ref 22–32)
Calcium: 9 mg/dL (ref 8.9–10.3)
Chloride: 103 mmol/L (ref 98–111)
Creatinine, Ser: 1.18 mg/dL (ref 0.61–1.24)
GFR, Estimated: >60 mL/min (ref >60)
Glucose, Bld: 108 mg/dL — ABNORMAL HIGH (ref 70–99)
Potassium: 4.2 mmol/L (ref 3.5–5.1)
Sodium: 137 mmol/L (ref 135–145)

## 2020-02-18 LAB — LIPID PANEL
Cholesterol: 154 mg/dL (ref 0–200)
HDL: 46 mg/dL (ref >40)
LDL Cholesterol: 79 mg/dL (ref 0–99)
Total CHOL/HDL Ratio: 3.3 RATIO
Triglycerides: 146 mg/dL (ref <150)
VLDL: 29 mg/dL (ref 0–40)

## 2020-02-18 LAB — CULTURE, BLOOD (ROUTINE X 2)
Culture: NO GROWTH
Culture: NO GROWTH

## 2020-02-18 LAB — CBC
HCT: 29.4 % — ABNORMAL LOW (ref 39.0–52.0)
Hemoglobin: 9.5 g/dL — ABNORMAL LOW (ref 13.0–17.0)
MCH: 28.1 pg (ref 26.0–34.0)
MCHC: 32.3 g/dL (ref 30.0–36.0)
MCV: 87 fL (ref 80.0–100.0)
Platelets: 217 10*3/uL (ref 150–400)
RBC: 3.38 MIL/uL — ABNORMAL LOW (ref 4.22–5.81)
RDW: 15.8 % — ABNORMAL HIGH (ref 11.5–15.5)
WBC: 7.9 10*3/uL (ref 4.0–10.5)
nRBC: 0 % (ref 0.0–0.2)

## 2020-02-18 LAB — VDRL, CSF: VDRL Quant, CSF: NONREACTIVE

## 2020-02-18 LAB — MAGNESIUM: Magnesium: 2.2 mg/dL (ref 1.7–2.4)

## 2020-02-18 LAB — PHOSPHORUS: Phosphorus: 2.7 mg/dL (ref 2.5–4.6)

## 2020-02-18 MED ORDER — AMLODIPINE BESYLATE 5 MG PO TABS
5.0000 mg | ORAL_TABLET | Freq: Every day | ORAL | Status: DC
  Administered 2020-02-19: 5 mg
  Filled 2020-02-18: qty 1

## 2020-02-18 MED ORDER — IOHEXOL 350 MG/ML SOLN
75.0000 mL | Freq: Once | INTRAVENOUS | Status: AC | PRN
  Administered 2020-02-18: 75 mL via INTRAVENOUS

## 2020-02-18 MED ORDER — ADULT MULTIVITAMIN W/MINERALS CH
1.0000 | ORAL_TABLET | Freq: Every day | ORAL | Status: DC
  Administered 2020-02-19 – 2020-03-17 (×28): 1 via ORAL
  Filled 2020-02-18 (×28): qty 1

## 2020-02-18 NOTE — Progress Notes (Signed)
Modified Barium Swallow Progress Note  Patient Details  Name: Howard Allen MRN: [id number] Date of Birth: [birth year]  Today's Date: 02/18/2020  Modified Barium Swallow completed.  Full report located under Chart Review in the Imaging Section.  Brief recommendations include the following:  Clinical Impression   Pt has a mild oropharyngeal dysphagia suspected to be mostly secondary to recent intubation as well as altered mentation. He has mild anterior spillage with thin liquids and prolonged mastication with solids. When boluses reach his pharynx he has mildly reduced base of tongue retraction and hyolaryngeal elevation/excursion. Minimal residuals remain in his pharynx post-swallow, and he is often able to achieve sufficient laryngeal vestibule closure to protect his airway. However, he also has fluctuating timing, and when boluses reach his pyriform sinuses before the swallow and start to spill posteriorly into the laryngeal vestibule, he does not have the ability to squeeze them back out. Suspect reduced glottic sufficiency as thin and nectar thickl barium continues to progress onward past his vocal folds as he swallows. Aspiration is silent and cannot be fully cleared with a cued cough. Airway protection is much better with honey thick liquids and solids, which are better contained above his valleculae before the swallow. Recommend starting Dys 2 diet and honey thick liquids. Will f/u for potential to advance as mentation improves and with more time post-extubation, as length of intubation was pretty brief.    Swallow Evaluation Recommendations       SLP Diet Recommendations: Dysphagia 2 (Fine chop) solids;Honey thick liquids   Liquid Administration via: Cup;Straw   Medication Administration: Crushed with puree   Supervision: Staff to assist with self feeding;Full supervision/cueing for compensatory strategies   Compensations: Minimize environmental distractions;Slow rate;Small  sips/bites   Postural Changes: Seated upright at 90 degrees   Oral Care Recommendations: Oral care BID        Osie Bond., M.A. [facility] Services Pager [phone] Office [phone]  02/18/2020,3:41 PM

## 2020-02-18 NOTE — Progress Notes (Signed)
  Speech Language Pathology Treatment: Dysphagia  Patient Details Name: Howard Allen MRN: [id number] DOB: [birth year] Today's Date: 02/18/2020 Time: 0912-0939 SLP Time Calculation (min) (ACUTE ONLY): 27 min  Assessment / Plan / Recommendation Clinical Impression  Pt is alert and better able to initiate swallowing today, although still quite confused. Per son, he was about to move into an ALF back in [place] PTA primarily to ensure that he was getting enough nutrition and to get him more social interaction (otherwise, much more independent in terms of his mentation and physical abilities). Although he is swallowing more independently today with only mild, intermittent anterior loss, he does also have multiple swallows and frequent coughs. Question potential impact from larger NGT, so this was removed by RN after administering morning medications given verbal approval by MD ([facility] team also made aware in case he needs replacement of temporary alternative means of nutrition). PO trials were reattempted after removal with less subswallows and significantly less coughing, but he did have some coughing that started near the end of PO intake as pt resumed liquids after eating solid foods. Recommend proceeding with MBS to better evaluate oropharyngeal swallow.    HPI HPI: 74 y.o. 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        SLP Plan  Continue with current plan of care       Recommendations  Diet recommendations: NPO;Other(comment) (ice chips) Medication Administration: Crushed with puree                Oral Care Recommendations: Oral care QID Follow up  Recommendations:  (tba) SLP Visit Diagnosis: Dysphagia, unspecified (R13.10) Plan: Continue with current plan of care       GO                Osie Bond., M.A. [place] Acute Rehabilitation Services Pager [phone] Office [phone]  02/18/2020, 10:11 AM

## 2020-02-18 NOTE — Progress Notes (Signed)
NAME:  Howard Allen, MRN:  [id number], DOB:  1945-07-28, LOS: 5 ADMISSION DATE:  02/13/2020, CONSULTATION DATE:  02/13/2020 REFERRING MD:  Cheral Marker, CHIEF COMPLAINT:  Seizure   Brief History   74 y.o. 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  CT head negative. PCCM consulted for admission.  History of present illness   Howard Allen is a 74 y.o. M with PMH of  Crohn's Disease, HTN, HL, Depressio, hospitalization in 10/2019 for pneumatosis s/p ileocolic resection and lysis of adhesions who has had mulitple recent seizures and was treated at outside hospitals. Pt was brought in by EMS for at least three witnessed generalized tonic-clonic seizures today.  He presented unresponsive and was intubated on arrival.   Past Medical History  Crohn's Disease and SBO, HTN, HL, Depression  Significant Hospital Events   10/6 >> Admitted to [facility]  Consults:  Neurology  Procedures:  10/6 >> ETT placed 10/7 >> LP 10/8 >> Extubated   Significant Diagnostic Tests:  10/6 CT Head >> No acute intracranial abnormalities; age-related volume loss and chronic small vessel disease 10/7 MRI brain: 1. Signal abnormality in the left thalamus most consistent with an acute infarct. 2. Signal abnormality in the mesial left temporal lobe and splenium of the corpus callosum which may reflect acute infarct, sequelae of recent seizure activity, or encephalitis (including herpes encephalitis). No contralateral cerebral involvement. 3. Mild chronic small vessel ischemic disease. 10/8 echo: LVEF 55-60%, mod LVH, Grade I diastolic dysfunction 40/9 CT Head >> Progressive low density in the left thalamus related to acute infarction; no hemorrhage 10/9 EEG >> Negative  Micro Data:  10/6 COVID-19 + influenza >> Negative  10/6 Blood Cultures >> NGTD 10/7 CSF Culture >> NGTD 10/7 HSV serology >> 2.02 on  igg testing. Will send pcr to determine past or active.  10/7 West Nile serology >>  10/7 VRDL serology >> 10/8 Quanitferon Gold  >> indeterminant 10/9 acid fast csf:  10/11 HSV PCR:   Antimicrobials:  Vancomycin 10/7 >> Ceftriaxone 10/7 >> Ampicillin 10/7 >> Acyclovir 10/7 >>  Interim history/subjective:   10/11: no events overnight. Awake and conversant but not completely oriented. Slurred speech and impulsive.   Objective   Blood pressure (!) 168/84, pulse (!) 57, temperature 97.6 F (36.4 C), temperature source Oral, resp. rate 16, height 5\' 10"  (1.778 m), weight 59.5 kg, SpO2 100 %.        Intake/Output Summary (Last 24 hours) at 02/18/2020 0847 Last data filed at 02/18/2020 0800 Gross per 24 hour  Intake 1304.2 ml  Output 2200 ml  Net -895.8 ml   Filed Weights   02/13/20 1715 02/15/20 0500  Weight: 68.9 kg 59.5 kg  General:  In bed reclining comfortably in nad HENT: NCAT PERLLA, EOMI, Lac on R eyebrow with sutures in place, ng in place PULM: CTA B CV: RRR, no mgr GI: BS+, soft, nontender, nondistended MSK: normal bulk and tone Neuro:awake non focal, slurred speech, oriented to self   Resolved Hospital Problem list   AKI Acute Respiratory Failure  Assessment & Plan:   # Meningitis  - LP performed on 10/7 consistent with meningitis - Cultures are NGTD at < 24 hours; HSV with positive IgG... pcr pending  -VDLR, and West nile serology pending -Crypto negative. Quantiferon gold pending.  - Continue Vancomycin, Ceftriaxone, Ampicillin, Acyclovir, decadron - Mental Status cont to improve slowly  # Acute Ischemic Infarct  -  Seen on MRI imaging on 10/7: left thalamic signal abnormality measuring 2.3 cm most consistent with acute infarct; no edema or hemorrhagic conversion. (neuro suspects 2/2 prolonged seizure) -will need outpt MRI in 8-12 weeks to help delineate this -outpt neuro f/u as well -recommend CTA head and neck for completeness sake of stroke work  up.  - Continue DAPT with Aspirin and Plavix (for 3 weeks) then ASA only - TTE negative for vegetations or thrombosis.  - Continue high intensity statin; Lipid panel not obtained this morning, will add on for tomorrow morning   # Tonic-Clonic Seizures # Status Epilepticus: Resolved - Secondary to meningitis  - Neurology on board; appreciate their recommendations - Continue Keppra IV and when can take PO cont 1000mg  BID  # Hypokalemia: resolved # Hypomagnesemia : resolved # Hypophosphoremia: resolved # B12 Deficiency  # Protein Calorie Deficiency - Likely due to malnutrition and anorexia, possibly worsened by malabsorption given bowel inflammation seen on imaging the past several months prior to resection. Of note, 70 lb weight loss in the past 10 months.  - Monitor K, Mg, and P daily - Replenish PRN; scheduled potassium repletion added  - Vitamin B12 1000 mcg daily  - Added liquid multi-vitamin  - Consult to dietician for tube feed initiation, speech following as well.  -will change ng to cortrak  # HTN # HLD - Home medications include Toprol 100 mg, Amlodipine 5 mg, Lipitor 20 mg, Fenofibrate 160 mg  - Blood Pressure continues to increase this AM, will plan to restart home BP medications slowly - increase norvasc to 5mg  - Labetalol 5 mg q2h PRN for SBP > 180 or DBP > 110  # Normocytic Anemia  # Hx of Iron Deficiency Anemia # B12 Deficiency  - B12 levels low. Iron panel shows low saturation, however normal iron and TIBC, unclear picture as not consistent completely with iron deficiency anemia or anemia of chronic disease. - Hemoglobin is stable at this time.  - Will continue to monitor and transfuse if hemoglobin < 7.0   # ? Crohn's Disease s/p Ileocolic Resection  - No longer on immunosuppressive therapy.   Best practice:  Diet: Tube Feeds DVT prophylaxis: Heparin GI prophylaxis: Protonix Glucose control: N/A Mobility: Bedrest Code Status: FULL Family Communication:  pending Disposition: transfer to progressive. Will ask TRH to assume care and CCM will sign off.   Labs   CBC: Recent Labs  Lab 02/13/20 1617 02/13/20 1749 02/14/20 0418 02/14/20 0418 02/15/20 0322 02/16/20 0729 02/16/20 1116 02/17/20 0329 02/18/20 0419  WBC 17.3*   < > 9.0  --  6.2 9.4  --  7.6 7.9  NEUTROABS 14.4*  --   --   --   --   --   --   --   --   HGB 10.9*   < > 9.1*   < > 8.2* 8.5* 8.5* 8.9* 9.5*  HCT 36.3*   < > 27.4*   < > 24.4* 26.7* 25.0* 28.3* 29.4*  MCV 95.8   < > 87.3  --  85.9 88.7  --  88.7 87.0  PLT 461*   < > 281  --  256 289  --  303 217   < > = values in this interval not displayed.    Basic Metabolic Panel: Recent Labs  Lab 02/14/20 0418 02/14/20 0418 02/15/20 0322 02/16/20 0729 02/16/20 1116 02/17/20 0329 02/18/20 0419  NA 138   < > 135 140 141 141 137  K 3.4*   < >  3.3* 3.8 3.6 3.8 4.2  CL 103  --  102 105  --  107 103  CO2 21*  --  21* 24  --  24 19*  GLUCOSE 102*  --  164* 128*  --  106* 108*  BUN 17  --  14 14  --  18 20  CREATININE 1.44*  --  1.19 1.23  --  1.18 1.18  CALCIUM 8.7*  --  8.3* 8.7*  --  9.1 9.0  MG 1.4*  --  1.8 1.7  --  1.9 2.2  PHOS 1.6*  --  3.1 2.8  --  2.8 2.7   < > = values in this interval not displayed.   GFR: Estimated Creatinine Clearance: 47.6 mL/min (by C-G formula based on SCr of 1.18 mg/dL). Recent Labs  Lab 02/13/20 1926 02/13/20 1935 02/14/20 0418 02/15/20 0322 02/16/20 0729 02/17/20 0329 02/18/20 0419  WBC  --   --    < > 6.2 9.4 7.6 7.9  LATICACIDVEN 1.9 2.2*  --   --   --   --   --    < > = values in this interval not displayed.    Liver Function Tests: Recent Labs  Lab 02/13/20 1617 02/15/20 0322  AST 32 20  ALT 17 14  ALKPHOS 31* 21*  BILITOT 0.5 0.5  PROT 6.7 5.1*  ALBUMIN 3.7 2.5*   No results for input(s): LIPASE, AMYLASE in the last 168 hours. No results for input(s): AMMONIA in the last 168 hours.  ABG    Component Value Date/Time   PHART 7.506 (H) 02/16/2020 1116    PCO2ART 28.5 (L) 02/16/2020 1116   PO2ART 81 (L) 02/16/2020 1116   HCO3 22.7 02/16/2020 1116   TCO2 24 02/16/2020 1116   ACIDBASEDEF 2.0 02/13/2020 1749   O2SAT 97.0 02/16/2020 1116     Coagulation Profile: No results for input(s): INR, PROTIME in the last 168 hours.  Cardiac Enzymes: Recent Labs  Lab 02/13/20 1930  CKTOTAL 128    HbA1C: Hgb A1c MFr Bld  Date/Time Value Ref Range Status  02/13/2020 07:30 PM 5.3 4.8 - 5.6 % Final    Comment:    (NOTE) Pre diabetes:          5.7%-6.4%  Diabetes:              >6.4%  Glycemic control for   <7.0% adults with diabetes     CBG: Recent Labs  Lab 02/17/20 1605 02/17/20 2005 02/17/20 2354 02/18/20 0402 02/18/20 0816  GLUCAP 120* 126* 104* 101* 95   Critical care time: The patient is critically ill with multiple organ systems failure and requires high complexity decision making for assessment and support, frequent evaluation and titration of therapies, application of advanced monitoring technologies and extensive interpretation of multiple databases.  Critical care time 35 mins. This represents my time independent of the NPs time taking care of the pt. This is excluding procedures.    [facility] 02/18/2020, 8:47 AM

## 2020-02-18 NOTE — Progress Notes (Signed)
Nutrition Follow-up  RD working remotely.  DOCUMENTATION CODES:   Not applicable  INTERVENTION:  - will order Hormel Shake BID, each supplement provides 500 kcal and 22 grams protein. - will order Magic Cup BID with meals, each supplement provides 290 kcal and 9 grams of protein. - will order 1 tablet multivitamin with minerals/day.   NUTRITION DIAGNOSIS:   Moderate Malnutrition related to   as evidenced by mild fat depletion, moderate fat depletion, mild muscle depletion, moderate muscle depletion. -ongoing  GOAL:   Patient will meet greater than or equal to 90% of their needs -unmet  MONITOR:   PO intake, Supplement acceptance, Labs, Weight trends  REASON FOR ASSESSMENT:   Consult Assessment of nutrition requirement/status (Weight in January 2021 was 196 lbs. Gradual weight loss over the last 10 months due to lack of appetite.)  ASSESSMENT:   74 yo male presents with 1 week of falls, hallucinations and admitted with recurrent seizures and status epilepticus requiring intubation. PMH includes hx of Crohn's, prior SBO, HTN, HLD, depression  He was extubated 10/8 at ~1450. NGT remained in place s/p extubation. NGT was removed this AM when SLP performed BSE and then recommendation was made for MBS. Patient is now on Dysphagia 2, honey-thick liquids following MBS. SLP recommending full supervision with meals to aid in increased intake for patient noted to be a/o to self only.   He was last weighed on 10/8 and weight on that date was -21 lb compared to weight on 10/6. Re-weigh patient today.   Patient was transferred from 4N to 3W following MBS.  Per notes: - meningitis s/p LP on 10/7 - acute ischemic infarct - tonic-clonic seizures and status epilepticus--resolved - protein-calorie malnutrition - hx of possible Crohn's disease s/p ileocolic resection    Labs reviewed; CBGs: 101, 95, 117 mg/dl.  Medications reviewed; 100 mg colace BID, sliding scale novolog, 2 g IV Mg  sulfate x1 run 10/10, multivitamin with minerals/day, 17 g miralax/day, 40 mEq KCl/day, 1000 mcg cyanocobalamin/day.     Diet Order:   Diet Order            DIET DYS 2 Room service appropriate? Yes with Assist; Fluid consistency: Honey Thick  Diet effective now                 EDUCATION NEEDS:   No education needs have been identified at this time  Skin:  Skin Assessment: Reviewed RN Assessment  Last BM:  10/7  Height:   Ht Readings from Last 1 Encounters:  02/13/20 5\' 10"  (1.778 m)    Weight:   Wt Readings from Last 1 Encounters:  02/15/20 59.5 kg     Estimated Nutritional Needs:  Kcal:  7867-5449 kcal Protein:  90-100 grams Fluid:  >/= 1.8 L      Jarome Matin, MS, RD, LDN, CNSC Inpatient Clinical Dietitian RD pager # available in [facility]  After hours/weekend pager # available in [facility]

## 2020-02-19 DIAGNOSIS — R569 Unspecified convulsions: Secondary | ICD-10-CM | POA: Diagnosis not present

## 2020-02-19 LAB — BASIC METABOLIC PANEL
Anion gap: 15 (ref 5–15)
BUN: 18 mg/dL (ref 8–23)
CO2: 24 mmol/L (ref 22–32)
Calcium: 9.4 mg/dL (ref 8.9–10.3)
Chloride: 96 mmol/L — ABNORMAL LOW (ref 98–111)
Creatinine, Ser: 1.27 mg/dL — ABNORMAL HIGH (ref 0.61–1.24)
GFR, Estimated: 56 mL/min — ABNORMAL LOW (ref >60)
Glucose, Bld: 93 mg/dL (ref 70–99)
Potassium: 3.8 mmol/L (ref 3.5–5.1)
Sodium: 135 mmol/L (ref 135–145)

## 2020-02-19 LAB — CREATININE, SERUM
Creatinine, Ser: 1.29 mg/dL — ABNORMAL HIGH (ref 0.61–1.24)
GFR, Estimated: 55 mL/min — ABNORMAL LOW (ref >60)

## 2020-02-19 LAB — GLUCOSE, CAPILLARY
Glucose-Capillary: 103 mg/dL — ABNORMAL HIGH (ref 70–99)
Glucose-Capillary: 119 mg/dL — ABNORMAL HIGH (ref 70–99)
Glucose-Capillary: 122 mg/dL — ABNORMAL HIGH (ref 70–99)
Glucose-Capillary: 129 mg/dL — ABNORMAL HIGH (ref 70–99)
Glucose-Capillary: 61 mg/dL — ABNORMAL LOW (ref 70–99)
Glucose-Capillary: 67 mg/dL — ABNORMAL LOW (ref 70–99)
Glucose-Capillary: 81 mg/dL (ref 70–99)
Glucose-Capillary: 95 mg/dL (ref 70–99)

## 2020-02-19 LAB — MAGNESIUM: Magnesium: 1.6 mg/dL — ABNORMAL LOW (ref 1.7–2.4)

## 2020-02-19 LAB — VANCOMYCIN, TROUGH: Vancomycin Tr: 26 ug/mL (ref 15–20)

## 2020-02-19 LAB — PHOSPHORUS: Phosphorus: 1.8 mg/dL — ABNORMAL LOW (ref 2.5–4.6)

## 2020-02-19 MED ORDER — CLOPIDOGREL BISULFATE 75 MG PO TABS
75.0000 mg | ORAL_TABLET | Freq: Every day | ORAL | Status: DC
  Administered 2020-02-20: 75 mg via ORAL
  Filled 2020-02-19: qty 1

## 2020-02-19 MED ORDER — POTASSIUM CHLORIDE CRYS ER 20 MEQ PO TBCR
40.0000 meq | EXTENDED_RELEASE_TABLET | Freq: Every day | ORAL | Status: DC
  Administered 2020-02-19 – 2020-03-07 (×18): 40 meq via ORAL
  Filled 2020-02-19 (×18): qty 2

## 2020-02-19 MED ORDER — VITAMIN B-12 1000 MCG PO TABS
1000.0000 ug | ORAL_TABLET | Freq: Every day | ORAL | Status: DC
  Administered 2020-02-20 – 2020-03-17 (×27): 1000 ug via ORAL
  Filled 2020-02-19 (×27): qty 1

## 2020-02-19 MED ORDER — VANCOMYCIN VARIABLE DOSE PER UNSTABLE RENAL FUNCTION (PHARMACIST DOSING): Status: DC

## 2020-02-19 MED ORDER — POLYETHYLENE GLYCOL 3350 17 G PO PACK
17.0000 g | PACK | Freq: Every day | ORAL | Status: DC
  Administered 2020-02-21 – 2020-03-17 (×19): 17 g via ORAL
  Filled 2020-02-19 (×24): qty 1

## 2020-02-19 MED ORDER — ASPIRIN 81 MG PO CHEW
81.0000 mg | CHEWABLE_TABLET | Freq: Every day | ORAL | Status: DC
  Administered 2020-02-20 – 2020-03-17 (×27): 81 mg via ORAL
  Filled 2020-02-19 (×27): qty 1

## 2020-02-19 MED ORDER — DEXTROSE 50 % IV SOLN
INTRAVENOUS | Status: AC
  Administered 2020-02-19: 25 mL
  Filled 2020-02-19: qty 50

## 2020-02-19 MED ORDER — POTASSIUM PHOSPHATES 15 MMOLE/5ML IV SOLN
20.0000 mmol | Freq: Once | INTRAVENOUS | Status: AC
  Administered 2020-02-19: 20 mmol via INTRAVENOUS
  Filled 2020-02-19: qty 6.67

## 2020-02-19 MED ORDER — AMLODIPINE BESYLATE 5 MG PO TABS
5.0000 mg | ORAL_TABLET | Freq: Every day | ORAL | Status: DC
  Administered 2020-02-20 – 2020-02-21 (×2): 5 mg via ORAL
  Filled 2020-02-19 (×2): qty 1

## 2020-02-19 MED ORDER — FENOFIBRATE 160 MG PO TABS
160.0000 mg | ORAL_TABLET | Freq: Every day | ORAL | Status: DC
  Administered 2020-02-19 – 2020-03-17 (×28): 160 mg via ORAL
  Filled 2020-02-19 (×28): qty 1

## 2020-02-19 MED ORDER — DOCUSATE SODIUM 50 MG/5ML PO LIQD
100.0000 mg | Freq: Two times a day (BID) | ORAL | Status: DC | PRN
  Administered 2020-02-24: 100 mg via ORAL
  Filled 2020-02-19: qty 10

## 2020-02-19 MED ORDER — METOPROLOL SUCCINATE ER 50 MG PO TB24
50.0000 mg | ORAL_TABLET | Freq: Every day | ORAL | Status: DC
  Administered 2020-02-19: 50 mg via ORAL
  Filled 2020-02-19: qty 1

## 2020-02-19 MED ORDER — DOCUSATE SODIUM 50 MG/5ML PO LIQD
100.0000 mg | Freq: Two times a day (BID) | ORAL | Status: DC
  Administered 2020-02-19 – 2020-03-17 (×41): 100 mg via ORAL
  Filled 2020-02-19 (×46): qty 10

## 2020-02-19 MED ORDER — MAGNESIUM SULFATE 2 GM/50ML IV SOLN
2.0000 g | Freq: Once | INTRAVENOUS | Status: AC
  Administered 2020-02-19: 2 g via INTRAVENOUS
  Filled 2020-02-19: qty 50

## 2020-02-19 MED ORDER — ATORVASTATIN CALCIUM 80 MG PO TABS
80.0000 mg | ORAL_TABLET | Freq: Every day | ORAL | Status: DC
  Administered 2020-02-20 – 2020-03-17 (×27): 80 mg via ORAL
  Filled 2020-02-19 (×27): qty 1

## 2020-02-19 NOTE — Progress Notes (Signed)
Inpatient Rehab Admissions Coordinator Note:   Per therapy recommendations, pt was screened for CIR candidacy by Vasiliki Smaldone, MS CCC-SLP. At this time, Pt. Appears to have functional decline and is a good candidate for CIR. Will place order for rehab consult per protocol.  Please contact me with questions.   Jaysen Wey, MS, CCC-SLP Rehab Admissions Coordinator  [phone] (celll) [phone] (office)  

## 2020-02-19 NOTE — Evaluation (Signed)
Occupational Therapy Evaluation Patient Details Name: Howard Allen MRN: [id number] DOB: 1946-03-03 Today's Date: 02/19/2020    History of Present Illness Pt is a 74 year old male who was having multiple seizures and has a history of falls. 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 NIH 4 on 02/15/20 and then 14 on 02/17/20. Medical hx consisting of Chron's disease and HTN.   Clinical Impression   Pt admitted with the above diagnoses and presents with below problem list. Pt will benefit from continued acute OT to address the below listed deficits and maximize independence with basic ADLs prior to d/c to venue below. Pt is unable to provide reliable PLOF data so obtained from son who was present. Pt has been living alone but "really struggling" per son, with family checking in on him "all the time." Per son, plan was in the works to transition pt to ALF. Pt currently max A with UB/LB ADLs, mod A +1 to stand with rw. Would need +2 assist to safely attempt further ambulation. Pt with decreased cognition, generalized weakness, decreased activity tolerance. A bit agitated at end of session "I've got to get out of here" but able to redirect.      Follow Up Recommendations  SNF    Equipment Recommendations  Other (comment) (defer to next venue)    Recommendations for Other Services       Precautions / Restrictions Precautions Precautions: Fall;Other (comment) (seizures; posey wrist restraints, hand mitts) Precaution Comments: rectal tube, condom cath Restrictions Weight Bearing Restrictions: No      Mobility Bed  Mobility Overal bed mobility: Needs Assistance Bed Mobility: Supine to Sit;Sit to Supine     Supine to sit: Mod assist Sit to supine: Min guard   General bed mobility comments: assist to powerup trunk and pivot hips to full EOB position. Sequencing cues.   Transfers Overall transfer level: Needs assistance Equipment used: Rolling walker (2 wheeled) Transfers: Sit to/from Stand Sit to Stand: Mod assist         General transfer comment: cues for safety. rw utilized. mod A to powerup steady adn control descent    Balance Overall balance assessment: History of Falls;Needs assistance Sitting-balance support: Bilateral upper extremity supported;Feet supported Sitting balance-Leahy Scale: Poor Sitting balance - Comments: Pt sits statically EOB majority of time with B UE support and no LOB, but occasionally leans posteriorly and requires modA to recover. Postural control: Posterior lean Standing balance support: Bilateral upper extremity supported;During functional activity Standing balance-Leahy Scale: Poor Standing balance comment: Unsteadiness noted at knees and mod trunk sway, requlting in him requiring modA to maintain balance with B UE support on RW.                           ADL either performed or assessed with clinical judgement   ADL Overall ADL's : Needs assistance/impaired Eating/Feeding: Moderate assistance;Sitting Eating/Feeding Details (indicate cue type and reason): 2/2 cognition Grooming: Maximal assistance Grooming Details (indicate cue type and reason): placed comb to head but difficulty sequencing movements to comb Upper Body Bathing: Maximal assistance;Sitting   Lower Body Bathing: Maximal  assistance;Sit to/from stand   Upper Body Dressing : Maximal assistance;Sitting   Lower Body Dressing: Maximal assistance;Sit to/from stand                 General ADL Comments: Sat EOB a few mintues then stood with mod A. Assist 2/2 generalized  weakness, decreased activity tolerance, and impaired cognition     Vision   Additional Comments: difficulty assessing 2/2 cognition     Perception     Praxis      Pertinent Vitals/Pain Pain Assessment: No/denies pain     Hand Dominance     Extremity/Trunk Assessment Upper Extremity Assessment Upper Extremity Assessment: Generalized weakness;Difficult to assess due to impaired cognition   Lower Extremity Assessment Lower Extremity Assessment: Defer to PT evaluation RLE Deficits / Details: MMT scores of 3+ to 4- grossly RLE Sensation: WNL (denies N/T) RLE Coordination: decreased fine motor;decreased gross motor LLE Deficits / Details: MMT scores of 3+ to 4- grossly LLE Sensation: WNL (denies N/T) LLE Coordination: decreased fine motor;decreased gross motor       Communication Communication Communication: Expressive difficulties;Other (comment) (low volume)   Cognition Arousal/Alertness: Awake/alert Behavior During Therapy: Restless;Impulsive Overall Cognitive Status: Impaired/Different from baseline Area of Impairment: Orientation;Attention;Memory;Following commands;Safety/judgement;Awareness;Problem solving                 Orientation Level: Disoriented to;Place;Time;Situation Current Attention Level: Sustained Memory: Decreased short-term memory;Decreased recall of precautions Following Commands: Follows one step commands inconsistently;Follows one step commands with increased time Safety/Judgement: Decreased awareness of safety;Decreased awareness of deficits Awareness: Intellectual Problem Solving: Slow processing;Decreased initiation;Difficulty sequencing;Requires verbal cues;Requires tactile cues General Comments: some agitation noted towards end of session. Wanting to get out of bed "I need to go." Able to redirect. States he needs to call his wife. Son reports wife died 3 years ago.    General Comments       Exercises     Shoulder Instructions       Home Living Family/patient expects to be discharged to:: Private residence Living Arrangements: Alone Available Help at Discharge: Family;Available PRN/intermittently Type of Home: Apartment Home Access: Level entry     Home Layout: One level     Bathroom Shower/Tub: Teacher, early years/pre: Standard Bathroom Accessibility: Yes   Home Equipment: Hospital bed;Grab bars - tub/shower   Additional Comments: Son present and able to provide PLOF data. Son reports pt was living alone but struggling with ADLs, family stopping by to check on him "all the time." Son reports plans were being made to move pt into ALF when he took a turn for the worse.      Prior Functioning/Environment Level of Independence:  (mod I)        Comments: living alone but struggling at home per son.         OT Problem List: Decreased strength;Decreased activity tolerance;Impaired balance (sitting and/or standing);Decreased cognition;Decreased safety awareness;Decreased knowledge of use of DME or AE;Decreased knowledge of precautions;Pain      OT Treatment/Interventions: Self-care/ADL training;Therapeutic exercise;Energy conservation;DME and/or AE instruction;Therapeutic activities;Cognitive remediation/compensation;Patient/family education;Balance training    OT Goals(Current goals can be found in the care plan section) Acute Rehab OT Goals Patient Stated Goal: pt goal: home; son's goal: ALF is goal OT Goal Formulation: With patient/family Time For Goal Achievement: 03/04/20 Potential to Achieve Goals: Good ADL Goals Pt Will Perform Grooming: with set-up;sitting Pt Will Perform Upper Body Dressing: with min assist;sitting Pt Will Perform Lower Body Dressing: with min assist;sit to/from stand Pt  Will Transfer to Toilet: with min assist;ambulating Pt Will Perform Toileting - Clothing Manipulation and hygiene: with min assist;sit to/from stand  OT Frequency: Min 2X/week   Barriers to D/C:             Co-evaluation              AM-PAC OT "6 Clicks" Daily Activity     Outcome Measure Help from another person eating meals?: A Lot Help from another person taking care of personal grooming?: A Lot Help from another person toileting, which includes using toliet, bedpan, or urinal?: A Lot Help from another person bathing (including washing, rinsing, drying)?: A Lot Help from another person to put on and taking off regular upper body clothing?: A Lot Help from another person to put on and taking off regular lower body clothing?: A Lot 6 Click Score: 12   End of Session Equipment Utilized During Treatment: Gait belt;Rolling walker Nurse Communication: Other (comment) (nurse present for most of session)  Activity Tolerance: Other (comment);Patient tolerated treatment well (some agitation noted at end of session) Patient left: in bed;with call bell/phone within reach;with bed alarm set;with nursing/sitter in room;with restraints reapplied  OT Visit Diagnosis: Unsteadiness on feet (R26.81);History of falling (Z91.81);Muscle weakness (generalized) (M62.81);Other symptoms and signs involving the nervous system (R29.898);Other symptoms and signs involving cognitive function;Pain                Time: 9937-1696 OT Time Calculation (min): 29 min Charges:  OT General Charges $OT Visit: 1 Visit OT Evaluation $OT Eval Moderate Complexity: 1 Mod OT Treatments $Self Care/Home Management : 8-22 mins  [name], OT Acute Rehabilitation Services Pager: [phone] Office: [phone]   [name] 02/19/2020, 2:42 PM

## 2020-02-19 NOTE — TOC Initial Note (Signed)
Transition of Care [facility]) - Initial/Assessment Note    Patient Details  Name: Howard Allen MRN: [id number] Date of Birth: Jul 31, 1945  Transition of Care [facility]) CM/SW Contact:    Geralynn Ochs, LCSW Phone Number: 02/19/2020, 3:29 PM  Clinical Narrative:    CSW spoke with patient's daughter, Threasa Beards, via phone to initiate discharge planning. CSW explained recommendation for CIR, and discussed family's desire to get the patient back home (patient is from [place], was only here on vacation). Threasa Beards acknowledged that they do want the patient back home closer to them, but that it may be more beneficial to have him rehab here and then bring him home after that. Melanie in agreement to pursue CIR, if appropriate. Melanie asked for medical update, and CSW reached out to attending and neurology to please update the daughter if able. CSW to follow for discharge planning when patient is medically stable.         Expected Discharge Plan: IP Rehab Facility Barriers to Discharge: Continued Medical Work up   Patient Goals and CMS Choice Patient states their goals for this hospitalization and ongoing recovery are:: patient unable to participate in goal setting due to disorientation CMS Medicare.gov Compare Post Acute Care list provided to:: Patient Represenative (must comment) Choice offered to / list presented to : Adult Children  Expected Discharge Plan and Services Expected Discharge Plan: [place]     Post Acute Care Choice: IP Rehab Living arrangements for the past 2 months: Single Family Home                                      Prior Living Arrangements/Services Living arrangements for the past 2 months: Single Family Home Lives with:: Self Patient language and need for interpreter reviewed:: No Do you feel safe going back to the place where you live?: Yes      Need for Family Participation in Patient Care: Yes (Comment) Care giver support system in place?:  Yes (comment)   Criminal Activity/Legal Involvement Pertinent to Current Situation/Hospitalization: No - Comment as needed  Activities of Daily Living   ADL Screening (condition at time of admission) Patient's cognitive ability adequate to safely complete daily activities?: No Is the patient deaf or have difficulty hearing?: No Does the patient have difficulty seeing, even when wearing glasses/contacts?: No Does the patient have difficulty concentrating, remembering, or making decisions?: Yes Patient able to express need for assistance with ADLs?: No Does the patient have difficulty dressing or bathing?: Yes Independently performs ADLs?: No Communication: Dependent Is this a change from baseline?: Change from baseline, expected to last <3 days Dressing (OT): Dependent Is this a change from baseline?: Change from baseline, expected to last <3days Grooming: Dependent Is this a change from baseline?: Change from baseline, expected to last <3 days Feeding: Dependent Is this a change from baseline?: Change from baseline, expected to last <3 days Bathing: Dependent Is this a change from baseline?: Change from baseline, expected to last <3 days Toileting: Dependent Is this a change from baseline?: Change from baseline, expected to last <3 days In/Out Bed: Dependent Is this a change from baseline?: Change from baseline, expected to last <3 days Walks in Home: Dependent Is this a change from baseline?: Change from baseline, expected to last <3 days Does the patient have difficulty walking or climbing stairs?: Yes Weakness of Legs: Both Weakness of Arms/Hands: Both  Permission Sought/Granted  Permission sought to share information with : Family Supports Permission granted to share information with : Yes, Verbal Permission Granted  Share Information with NAME: Harvie Bridge     Permission granted to share info w Relationship: Children     Emotional Assessment    Attitude/Demeanor/Rapport: Unable to Assess Affect (typically observed): Unable to Assess Orientation: : Oriented to Self Alcohol / Substance Use: Not Applicable Psych Involvement: No (comment)  Admission diagnosis:  Hypokalemia [E87.6] 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 Seizure ([place]) 02/13/2020  . Hypokalemia   . Status epilepticus ([place])   . Hypotension    PCP:  System, Provider Not In Pharmacy:   [facility] [address] [place] [address] [address] [place] [address] Phone: [phone] Fax: [phone]     Social Determinants of Health (SDOH) Interventions    Readmission Risk Interventions No flowsheet data found.

## 2020-02-19 NOTE — Evaluation (Signed)
Physical Therapy Evaluation Patient Details Name: Howard Allen MRN: [id number] DOB: [birth year] Today's Date: 02/19/2020   History of Present Illness  Pt is a 74 year old male who was having multiple seizures and has a history of falls. 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 NIH 4 on 02/15/20 and then 14 on 02/17/20. Medical hx consisting of Chron's disease and HTN.  Clinical Impression  Pt very impulsive and has poor safety awareness. He requires single step commands and continual TCs and VCs to perform functional mobility safely. He ambulates only up to ~20 ft before becoming fatigued and utilizes a RW. He displays decreased speed, decreased B step length, narrow BOS, and occasional LE scissoring during gait along with B trunk sway, resulting in him requiring modA for safety and to manage RW. He would benefit from +2 personnel to facilitate proper pattern during gait. Secondary to his deficits mentioned below, potential for improvement with intensive therapy services, and his drastic change from his PLOF, will recommend CIR at this time. Will continue to follow-up acutely to address his deficits to maximize his independence and safety with all functional mobility.    Follow Up Recommendations CIR;Supervision/Assistance - 24 hour    Equipment Recommendations  Rolling walker with 5" wheels;3in1 (PT);Wheelchair (measurements PT);Wheelchair cushion (measurements PT);Hospital bed (may change as pt progresses)    Recommendations for Other Services Rehab consult     Precautions / Restrictions Precautions Precautions: Fall;Other  (comment) (seizures; posey wrist restraints) Precaution Comments: seizures, condom catheter, posey wrist restraints Restrictions Weight Bearing Restrictions: No      Mobility  Bed Mobility Overal bed mobility: Needs Assistance Bed Mobility: Supine to Sit;Sit to Supine     Supine to sit: Mod assist Sit to supine: Min guard   General bed mobility comments: Bed flat, cuing pt to manage LEs off L EOB and modA to ascend trunk and scoot anteriorly to square hips with EOB. Sit to supine with extra time and cues for safety to manage trunk into bed.  Transfers Overall transfer level: Needs assistance Equipment used: Rolling walker (2 wheeled) Transfers: Sit to/from Stand Sit to Stand: Mod assist         General transfer comment: Cued pt for proper hand placement on bed rather than RW to come to stand, demonstrating unsteadiness and trunk sway, requiring modA to maintain balance and safety.  Ambulation/Gait Ambulation/Gait assistance: Mod assist Gait Distance (Feet): 20 Feet Assistive device: Rolling walker (2 wheeled) Gait Pattern/deviations: Decreased step length - right;Decreased step length - left;Decreased stride length;Shuffle;Scissoring;Staggering left;Staggering right;Narrow base of support Gait velocity: decreased Gait velocity interpretation: <1.31 ft/sec, indicative of household ambulator General Gait Details: Ambulated around end of bed from L side to R of bed with RW at decreased speed. Pt exhibited narrow BOS and dec B step length with some scissoring, resulting in him stepping on his own feet with each step despite cues to correct. Pt unsteady and loses balance L and R, modA for safety and balance.,  Stairs            Wheelchair Mobility    Modified Rankin (Stroke Patients Only) Modified Rankin (Stroke Patients Only)  Pre-Morbid Rankin Score: No symptoms Modified Rankin: Moderately severe disability     Balance Overall balance assessment: History of  Falls;Needs assistance Sitting-balance support: Bilateral upper extremity supported;Feet supported Sitting balance-Leahy Scale: Poor Sitting balance - Comments: Pt sits statically EOB majority of time with B UE support and no LOB, but occasionally leans posteriorly and requires modA to recover. Postural control: Posterior lean Standing balance support: Bilateral upper extremity supported;During functional activity Standing balance-Leahy Scale: Poor Standing balance comment: Unsteadiness noted at knees and mod trunk sway, requlting in him requiring modA to maintain balance with B UE support on RW.                             Pertinent Vitals/Pain Pain Assessment: No/denies pain    Home Living Family/patient expects to be discharged to:: Private residence Living Arrangements: Alone   Type of Home: Apartment Home Access: Level entry     Home Layout: One level Home Equipment: Hospital bed;Grab bars - tub/shower Additional Comments: Pt may be poor historian as he would report not remembering some details. Family not present to confirm home set-up and PLOF details.    Prior Function Level of Independence: Independent         Comments: Pt reports still driving but not working.     Hand Dominance        Extremity/Trunk Assessment   Upper Extremity Assessment Upper Extremity Assessment: Defer to OT evaluation    Lower Extremity Assessment Lower Extremity Assessment: RLE deficits/detail;LLE deficits/detail RLE Deficits / Details: MMT scores of 3+ to 4- grossly RLE Sensation: WNL (denies N/T) RLE Coordination: decreased fine motor;decreased gross motor LLE Deficits / Details: MMT scores of 3+ to 4- grossly LLE Sensation: WNL (denies N/T) LLE Coordination: decreased fine motor;decreased gross motor       Communication   Communication: Expressive difficulties (difficulty prnouncing words and talks softly)  Cognition Arousal/Alertness: Awake/alert Behavior  During Therapy: Restless;Impulsive Overall Cognitive Status: Impaired/Different from baseline Area of Impairment: Orientation;Attention;Memory;Following commands;Safety/judgement;Awareness;Problem solving                 Orientation Level: Disoriented to;Place;Time;Situation Current Attention Level: Selective Memory: Decreased short-term memory;Decreased recall of precautions Following Commands: Follows one step commands inconsistently;Follows one step commands with increased time Safety/Judgement: Decreased awareness of safety;Decreased awareness of deficits Awareness: Intellectual Problem Solving: Slow processing;Decreased initiation;Difficulty sequencing;Requires verbal cues;Requires tactile cues General Comments: Required repeated cues to not pull at lines during session. Required single step commands along with physical assistance to problem-solve how to manage the RW during gait to turn and sit. Pt unaware of safety concerns.      General Comments      Exercises     Assessment/Plan    PT Assessment Patient needs continued PT services  PT Problem List Decreased strength;Decreased activity tolerance;Decreased balance;Decreased mobility;Decreased coordination;Decreased cognition;Decreased knowledge of use of DME;Decreased safety awareness;Decreased knowledge of precautions       PT Treatment Interventions DME instruction;Gait training;Stair training;Functional mobility training;Therapeutic activities;Therapeutic exercise;Balance training;Neuromuscular re-education;Cognitive remediation;Patient/family education    PT Goals (Current goals can be found in the Care Plan section)  Acute Rehab PT Goals Patient Stated Goal: to go home PT Goal Formulation: With patient Time For Goal Achievement: 03/04/20 Potential to Achieve Goals: Good    Frequency Min 4X/week   Barriers to discharge        Co-evaluation               AM-PAC  PT "6 Clicks" Mobility  Outcome  Measure Help needed turning from your back to your side while in a flat bed without using bedrails?: A Little Help needed moving from lying on your back to sitting on the side of a flat bed without using bedrails?: A Lot Help needed moving to and from a bed to a chair (including a wheelchair)?: A Lot Help needed standing up from a chair using your arms (e.g., wheelchair or bedside chair)?: A Lot Help needed to walk in hospital room?: A Lot Help needed climbing 3-5 steps with a railing? : A Lot 6 Click Score: 13    End of Session Equipment Utilized During Treatment: Gait belt Activity Tolerance: Patient limited by fatigue Patient left: in bed;with call bell/phone within reach;with bed alarm set;with restraints reapplied (wrist restraints)   PT Visit Diagnosis: Unsteadiness on feet (R26.81);Other abnormalities of gait and mobility (R26.89);Muscle weakness (generalized) (M62.81);History of falling (Z91.81);Difficulty in walking, not elsewhere classified (R26.2);Other symptoms and signs involving the nervous system (R29.898)    Time: 3546-5681 PT Time Calculation (min) (ACUTE ONLY): 32 min   Charges:   PT Evaluation $PT Eval Moderate Complexity: 1 Mod PT Treatments $Gait Training: 8-22 mins        Moishe Spice, PT, DPT Acute Rehabilitation Services  Pager: [phone] Office: [place] 02/19/2020, 12:33 PM

## 2020-02-19 NOTE — Progress Notes (Signed)
PROGRESS NOTE    Howard Allen  EXH:[id number] DOB: 07/01/1945 DOA: 02/13/2020 PCP: System, Provider Not In   Brief Narrative: The patient is a 74 year old Caucasian male with a past medical history significant for but not limited to Crohn's disease, hypertension, hyperlipidemia, depression history of hospitalization in June 2021 for pneumatosis status post ileocolonic resection and lysis of adhesions who presented with multiple tonic-clonic seizures complicated by status epilepticus that required intubation and loading with Keppra. CT of the head was negative and initially when he was brought in to the ED by EMS there was at least 3 witnessed generalized tonic-clonic seizures noted. PCCM was consulted for admission given that he was intubated on arrival and unresponsive. He was intubated on 02/13/2020 and extubated 02/15/2020. Neurology was consulted and he underwent further work-up and was found to have a signal abnormality in the left limb is most consistent with an acute infarct. Echocardiogram done showed an EF of 55 to 60% with moderate LVH and grade 1 diastolic dysfunction. EEG testing on 10 9 was negative. 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 He underwent several viral serologies and had a lumbar puncture sent on 02/14/2020. He was started on IV vancomycin, IV ceftriaxone, IV ampicillin and IV acyclovir for suspected meningitis. He was transferred to [facility] service on 02/19/2020 and remains confused. Neurology is following but did not see the patient today and PT OT recommending CIR and are prior to going back to [place] where patient's daughter lives.   Assessment & Plan:   Active Problems:   Seizure ([place])  Meningitis  - LP performed on 10/7 consistent with meningitis - Cultures are NGTD at 3 Days; HSV with positive IgG... pcr pending still  - VDLR, and [place] serology  pending - Crypto negative. Quantiferon gold was indeterminate.  - Continue Vancomycin, Ceftriaxone, Ampicillin, Acyclovir, decadron for now  - Mental Status cont to improve slowly but still confused   Acute Ischemic Infarct  -Seen on MRI imaging on 10/7: left thalamic signal abnormality measuring 2.3 cm most consistent with acute infarct; no edema or hemorrhagic conversion. 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 2 mm left paraophthalmic ICA aneurysm. Widely patent cervical carotid and vertebral arteries. Right upper lobe pulmonary nodules measuring up to 5 mm. Aortic Atherosclerosis (ICD10-I70.0) and Emphysema." - Continue DAPT with Aspirin and Plavix (for 3 weeks) then ASA only; now on atorvastatin 80 mg despite that he takes 20 at home given his acute CVA - TTE negative for vegetations or thrombosis.  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  Hypophosphatemia -Patient's phosphorus level was 1.8  -replete with IV K-Phos 20 mmol -  Continue to monitor and replete as necessary -Repeat phosphorus level in a.m.  B12 Deficiency  Moderate Malnutrition - Likely due to  malnutrition and anorexia, possibly worsened by malabsorption given bowel inflammation seen on imaging the past several months prior to resection. Of note, 70 lb weight loss in the past 10 months.  - Monitor K, Mg, and P daily - Replenish PRN; scheduled potassium repletion added  - Vitamin B12 1000 mcg daily  - Added liquid multi-vitamin  - Consult to dietician for tube feed initiation, speech following as well. They -Core track was now discontinued as patient was cleared for dysphagia to diet with honey thick liquids Nutritionist now recommending hormonal shake twice daily as well as Magic cup twice daily with meals and multivitamin with minerals daily  HTN HLD -Home medications include Toprol 100 mg, Amlodipine 5 mg, Lipitor 20 mg, Fenofibrate 160 mg  -Blood Pressure continues to increase this AM, will plan to restart home BP medications slowly -Increase Amlodipine to 5 mg -Last blood pressure was 178/104 -We will resume his Metoprolol Succinate 50 mg p.o. daily given that he does have some reflex tachycardia -Labetalol 5 mg q2h PRN for SBP > 180 or DBP > 110  Normocytic Anemia  Hx of Iron Deficiency Anemia B12 Deficiency  - B12 levels low. Iron panel shows low saturation, however normal iron and TIBC, unclear picture as not consistent completely with iron deficiency anemia or anemia of chronic disease. - Hemoglobin is stable yesterday but unknown but was not repeated today despite it being ordered. Hemoglobin/hematocrit was 9.5/29.4 yesterday morning - Will continue to monitor and transfuse if hemoglobin < 7.0  -Continue to monitor for signs and symptoms of bleeding  ? Crohn's Disease s/p Ileocolic Resection  - No longer on immunosuppressive therapy. -Takes budesonide as an outpatient but this has not been resumed yet will likely can be resumed in the a.m.  AKI on CKD stage II -Patient's BUN/creatinine this morning was 18/1.27 -Likely has CKD stage II and BUN/creatinine was  20/1.18 the day before  DVT prophylaxis: SCDs Heparin 5000 units subcu every 8 hours Code Status: FULL CODE  Family Communication: No family present at bedside  Disposition Plan: Pending further clinical improvement back to baseline and clearance by neurology prior to safe discharge disposition  Status is: Inpatient  Remains inpatient appropriate because:Altered mental status, Unsafe d/c plan, IV treatments appropriate due to intensity of illness or inability to take PO and Inpatient level of care appropriate due to severity of illness  Dispo: The patient is from: Home              Anticipated d/c is to: CIR              Anticipated d/c date is: 2 days              Patient currently is not medically stable to d/c.  Consultants:   Neurology  PCCM Transfer   Procedures:  10/6 >> ETT placed 10/7 >> LP 10/8 >> Extubated   Significant Diagnostic Tests:  10/6 CT Head >> No acute intracranial abnormalities; age-related volume loss and chronic small vessel disease 10/7 MRI brain: 1. Signal abnormality in the left thalamus most consistent with an acute infarct. 2. Signal abnormality in the mesial left temporal lobe and splenium of the corpus callosum which may reflect acute infarct, sequelae of recent seizure activity, or encephalitis (including herpes encephalitis). No contralateral cerebral involvement. 3. Mild chronic small vessel ischemic disease. 10/8 echo: LVEF 55-60%,  mod LVH, Grade I diastolic dysfunction 13/2 CT Head >> Progressive low density in the left thalamus related to acute infarction; no hemorrhage 10/9 EEG >> Negative  Micro Data:  10/6 COVID-19 + influenza >> Negative  10/6 Blood Cultures >> NGTD 10/7 CSF Culture >> NGTD 10/7 HSV serology >> 2.02 on igg testing. Will send pcr to determine past or active.  10/7 [place] serology >>  10/7 VRDL serology >> 10/8 Quanitferon Gold  >> indeterminant 10/9 acid fast csf:  10/11 HSV PCR: pending    Antimicrobials:  Anti-infectives (From admission, onward)   Start     Dose/Rate Route Frequency Ordered Stop   02/19/20 1811  vancomycin variable dose per unstable renal function (pharmacist dosing)         Does not apply See admin instructions 02/19/20 1811     02/18/20 0500  vancomycin (VANCOREADY) IVPB 750 mg/150 mL  Status:  Discontinued        750 mg 150 mL/hr over 60 Minutes Intravenous Every 12 hours 02/17/20 1651 02/19/20 1811   02/15/20 0300  vancomycin (VANCOREADY) IVPB 500 mg/100 mL        500 mg 100 mL/hr over 60 Minutes Intravenous Every 12 hours 02/14/20 1352 02/17/20 1755   02/14/20 1400  acyclovir (ZOVIRAX) 690 mg in dextrose 5 % 100 mL IVPB        10 mg/kg  68.9 kg 113.8 mL/hr over 60 Minutes Intravenous Every 12 hours 02/14/20 1245     02/14/20 1400  vancomycin (VANCOREADY) IVPB 1250 mg/250 mL        1,250 mg 166.7 mL/hr over 90 Minutes Intravenous STAT 02/14/20 1352 02/14/20 1655   02/14/20 1300  cefTRIAXone (ROCEPHIN) 2 g in sodium chloride 0.9 % 100 mL IVPB        2 g 200 mL/hr over 30 Minutes Intravenous Every 12 hours 02/14/20 1245     02/14/20 1300  ampicillin (OMNIPEN) 2 g in sodium chloride 0.9 % 100 mL IVPB  Status:  Discontinued        2 g 300 mL/hr over 20 Minutes Intravenous Every 6 hours 02/14/20 1245 02/18/20 1051       Subjective: The patient was seen and examined at bedside he remains somewhat confused and he was pulling out his lines and pulled off his Foley catheter in his Flexi-Seal.  Does communicate but remains confused.  Slowly coming around from what nursing states.  No chest pain, lightheadedness or dizziness.  No complaints.  No other concerns or complaints at this time.  Objective: Vitals:   02/19/20 0524 02/19/20 0810 02/19/20 1142 02/19/20 1558  BP: (!) 164/93 (!) 174/88 (!) 162/101 (!) 178/104  Pulse: 90 (!) 105 95 (!) 122  Resp:  18 18 18   Temp:  98.2 F (36.8 C)  99 F (37.2 C)  TempSrc:  Oral    SpO2:  100% 100% 99%   Weight:      Height:        Intake/Output Summary (Last 24 hours) at 02/19/2020 1858 Last data filed at 02/19/2020 1700 Gross per 24 hour  Intake 680 ml  Output 450 ml  Net 230 ml   Filed Weights   02/15/20 0500 02/18/20 1548 02/19/20 0500  Weight: 59.5 kg 59.5 kg 61.1 kg   Examination: Physical Exam:  Constitutional: Thin elderly Caucasian male currently in NAD and appears agitated and slightly uncomfortable as he has pulled off his lines has been picking his skin Eyes: Lids and conjunctivae normal, sclerae  anicteric  ENMT: External Ears, Nose appear normal. Grossly normal hearing.  Neck: Appears normal, supple, no cervical masses, normal ROM, no appreciable thyromegaly: No JVD Respiratory: Diminished to auscultation bilaterally, no wheezing, rales, rhonchi or crackles. Normal respiratory effort and patient is not tachypenic. No accessory muscle use.  Unlabored breathing Cardiovascular: RRR, no murmurs / rubs / gallops. S1 and S2 auscultated.  Abdomen: Soft, non-tender, non-distended. Bowel sounds positive.  GU: Deferred. Musculoskeletal: No clubbing / cyanosis of digits/nails. No joint deformity upper and lower extremities.  Skin: No rashes, lesions, ulcers on limited skin evaluation but he has been picking at his skin. No induration; Warm and dry.  Neurologic: He is awake and cranial nerves II through XII grossly intact Psychiatric: Impaired judgment and insight.  He is awake but not fully alert and he is only oriented x 1 Normal mood and appropriate affect.   Data Reviewed: I have personally reviewed following labs and imaging studies  CBC: Recent Labs  Lab 02/13/20 1617 02/13/20 1749 02/14/20 0418 02/14/20 0418 02/15/20 0322 02/16/20 0729 02/16/20 1116 02/17/20 0329 02/18/20 0419  WBC 17.3*   < > 9.0  --  6.2 9.4  --  7.6 7.9  NEUTROABS 14.4*  --   --   --   --   --   --   --   --   HGB 10.9*   < > 9.1*   < > 8.2* 8.5* 8.5* 8.9* 9.5*  HCT 36.3*   < > 27.4*   < >  24.4* 26.7* 25.0* 28.3* 29.4*  MCV 95.8   < > 87.3  --  85.9 88.7  --  88.7 87.0  PLT 461*   < > 281  --  256 289  --  303 217   < > = values in this interval not displayed.   Basic Metabolic Panel: Recent Labs  Lab 02/15/20 0322 02/15/20 0322 02/16/20 0729 02/16/20 1116 02/17/20 0329 02/18/20 0419 02/19/20 0641 02/19/20 1813  NA 135   < > 140 141 141 137 135  --   K 3.3*   < > 3.8 3.6 3.8 4.2 3.8  --   CL 102  --  105  --  107 103 96*  --   CO2 21*  --  24  --  24 19* 24  --   GLUCOSE 164*  --  128*  --  106* 108* 93  --   BUN 14  --  14  --  18 20 18   --   CREATININE 1.19   < > 1.23  --  1.18 1.18 1.27* 1.29*  CALCIUM 8.3*  --  8.7*  --  9.1 9.0 9.4  --   MG 1.8  --  1.7  --  1.9 2.2 1.6*  --   PHOS 3.1  --  2.8  --  2.8 2.7 1.8*  --    < > = values in this interval not displayed.   GFR: Estimated Creatinine Clearance: 44.7 mL/min (A) (by C-G formula based on SCr of 1.29 mg/dL (H)). Liver Function Tests: Recent Labs  Lab 02/13/20 1617 02/15/20 0322  AST 32 20  ALT 17 14  ALKPHOS 31* 21*  BILITOT 0.5 0.5  PROT 6.7 5.1*  ALBUMIN 3.7 2.5*   No results for input(s): LIPASE, AMYLASE in the last 168 hours. No results for input(s): AMMONIA in the last 168 hours. Coagulation Profile: No results for input(s): INR, PROTIME in the last 168 hours. Cardiac Enzymes: Recent  Labs  Lab 02/13/20 1930  CKTOTAL 128   BNP (last 3 results) No results for input(s): PROBNP in the last 8760 hours. HbA1C: No results for input(s): HGBA1C in the last 72 hours. CBG: Recent Labs  Lab 02/19/20 0752 02/19/20 1138 02/19/20 1206 02/19/20 1230 02/19/20 1559  GLUCAP 81 67* 61* 119* 95   Lipid Profile: Recent Labs    02/17/20 0329 02/18/20 0419  CHOL  --  154  HDL  --  46  LDLCALC  --  79  TRIG 181* 146  CHOLHDL  --  3.3   Thyroid Function Tests: No results for input(s): TSH, T4TOTAL, FREET4, T3FREE, THYROIDAB in the last 72 hours. Anemia Panel: No results for input(s):  VITAMINB12, FOLATE, FERRITIN, TIBC, IRON, RETICCTPCT in the last 72 hours. Sepsis Labs: Recent Labs  Lab 02/13/20 1926 02/13/20 1935  LATICACIDVEN 1.9 2.2*    Recent Results (from the past 240 hour(s))  Respiratory Panel by RT PCR (Flu A&B, Covid) - Nasopharyngeal Swab     Status: None   Collection Time: 02/13/20  4:17 PM   Specimen: Nasopharyngeal Swab  Result Value Ref Range Status   SARS Coronavirus 2 by RT PCR NEGATIVE NEGATIVE Final    Comment: (NOTE) SARS-CoV-2 target nucleic acids are NOT DETECTED.  The SARS-CoV-2 RNA is generally detectable in upper respiratoy specimens during the acute phase of infection. The lowest concentration of SARS-CoV-2 viral copies this assay can detect is 131 copies/mL. A negative result does not preclude SARS-Cov-2 infection and should not be used as the sole basis for treatment or other patient management decisions. A negative result may occur with  improper specimen collection/handling, submission of specimen other than nasopharyngeal swab, presence of viral mutation(s) within the areas targeted by this assay, and inadequate number of viral copies (<131 copies/mL). A negative result must be combined with clinical observations, patient history, and epidemiological information. The expected result is Negative.  Fact Sheet for Patients:  PinkCheek.be  Fact Sheet for Healthcare Providers:  GravelBags.it  This test is no t yet approved or cleared by the [place] FDA and  has been authorized for detection and/or diagnosis of SARS-CoV-2 by FDA under an Emergency Use Authorization (EUA). This EUA will remain  in effect (meaning this test can be used) for the duration of the COVID-19 declaration under Section 564(b)(1) of the Act, 21 U.S.C. section 360bbb-3(b)(1), unless the authorization is terminated or revoked sooner.     Influenza A by PCR NEGATIVE NEGATIVE Final   Influenza B  by PCR NEGATIVE NEGATIVE Final    Comment: (NOTE) The Xpert Xpress SARS-CoV-2/FLU/RSV assay is intended as an aid in  the diagnosis of influenza from Nasopharyngeal swab specimens and  should not be used as a sole basis for treatment. Nasal washings and  aspirates are unacceptable for Xpert Xpress SARS-CoV-2/FLU/RSV  testing.  Fact Sheet for Patients: PinkCheek.be  Fact Sheet for Healthcare Providers: GravelBags.it  This test is not yet approved or cleared by the [place] FDA and  has been authorized for detection and/or diagnosis of SARS-CoV-2 by  FDA under an Emergency Use Authorization (EUA). This EUA will remain  in effect (meaning this test can be used) for the duration of the  Covid-19 declaration under Section 564(b)(1) of the Act, 21  U.S.C. section 360bbb-3(b)(1), unless the authorization is  terminated or revoked. Performed at [facility], [place] [address], [place], [place] [address]   Culture, blood (Routine X 2) w Reflex to ID Panel  Status: None   Collection Time: 02/13/20  4:57 PM   Specimen: BLOOD LEFT FOREARM  Result Value Ref Range Status   Specimen Description BLOOD LEFT FOREARM  Final   Special Requests   Final    BOTTLES DRAWN AEROBIC AND ANAEROBIC Blood Culture results may not be optimal due to an excessive volume of blood received in culture bottles   Culture   Final    NO GROWTH 5 DAYS Performed at [facility], [place] [address], [place], [place] [address]    Report Status 02/18/2020 FINAL  Final  Culture, blood (Routine X 2) w Reflex to ID Panel     Status: None   Collection Time: 02/13/20  5:02 PM   Specimen: BLOOD LEFT WRIST  Result Value Ref Range Status   Specimen Description BLOOD LEFT WRIST  Final   Special Requests   Final    BOTTLES DRAWN AEROBIC AND ANAEROBIC Blood Culture results may not be optimal due to an excessive volume of blood received in culture bottles    Culture   Final    NO GROWTH 5 DAYS Performed at [facility], [place] [address], [place], [place] [address]    Report Status 02/18/2020 FINAL  Final  MRSA PCR Screening     Status: Abnormal   Collection Time: 02/14/20  3:03 AM   Specimen: Nasal Mucosa; Nasopharyngeal  Result Value Ref Range Status   MRSA by PCR POSITIVE (A) NEGATIVE Final    Comment:        The GeneXpert MRSA Assay (FDA approved for NASAL specimens only), is one component of a comprehensive MRSA colonization surveillance program. It is not intended to diagnose MRSA infection nor to guide or monitor treatment for MRSA infections. RESULT CALLED TO, READ BACK BY AND VERIFIED WITH: Lillie Fragmin RN 8:25 02/14/20 ([email or web address]) Performed at [facility], [place] [address], [place], [place] [address]   CSF culture     Status: None   Collection Time: 02/14/20 11:01 AM   Specimen: CSF; Cerebrospinal Fluid  Result Value Ref Range Status   Specimen Description CSF  Final   Special Requests NONE  Final   Gram Stain   Final    WBC PRESENT,BOTH PMN AND MONONUCLEAR NO ORGANISMS SEEN CYTOSPIN SMEAR    Culture   Final    NO GROWTH 3 DAYS Performed at [facility], [place] [address] [place], [place] [address]    Report Status 02/17/2020 FINAL  Final     RN Pressure Injury Documentation:     Estimated body mass index is 19.33 kg/m as calculated from the following:   Height as of this encounter: 5\' 10"  (1.778 m).   Weight as of this encounter: 61.1 kg.  Malnutrition Type:  Nutrition Problem: Moderate Malnutrition   Malnutrition Characteristics:  Signs/Symptoms: mild fat depletion, moderate fat depletion, mild muscle depletion, moderate muscle depletion   Nutrition Interventions:  Interventions: Hormel Shake, Magic cup, MVI   Radiology Studies: CT ANGIO HEAD W OR WO CONTRAST  Result Date: 02/18/2020 CLINICAL DATA:  Recent seizures and meningitis with left thalamic and left hippocampal signal  abnormality on MRI. EXAM: CT ANGIOGRAPHY HEAD AND NECK TECHNIQUE: Multidetector CT imaging of the head and neck was performed using the standard protocol during bolus administration of intravenous contrast. Multiplanar CT image reconstructions and MIPs were obtained to evaluate the vascular anatomy. Carotid stenosis measurements (when applicable) are obtained utilizing NASCET criteria, using the distal internal carotid diameter as the  denominator. CONTRAST:  34mL OMNIPAQUE IOHEXOL 350 MG/ML SOLN COMPARISON:  Head MRI 02/14/2020 FINDINGS: CT HEAD FINDINGS Brain: A 2.5 cm region of hypoattenuation at the ventral aspect of the left thalamus corresponds to cytotoxic edema on MRI. No new infarct, intracranial hemorrhage, midline shift, or extra-axial fluid collection is identified. Hypodensities in the cerebral white matter bilaterally are nonspecific but compatible with mild chronic small vessel ischemic disease. Vascular: No hyperdense vessel. Skull: No fracture or suspicious osseous lesion. Sinuses: Visualized paranasal sinuses and mastoid air cells are clear. Orbits: Bilateral cataract extraction. Review of the MIP images confirms the above findings CTA NECK FINDINGS Aortic arch: Normal variant aortic arch branching pattern with common origin of the brachiocephalic and left common carotid arteries. Mild atherosclerosis without arch vessel origin stenosis. Right carotid system: Patent with minimal scattered soft plaque in the common carotid and proximal internal carotid artery. No evidence of dissection or stenosis. Left carotid system: Patent with minimal calcified and soft plaque at the carotid bifurcation. No evidence of dissection or stenosis. Vertebral arteries: Patent without evidence of dissection or stenosis. Mildly dominant right vertebral artery. Skeleton: Moderate lower cervical disc degeneration. Asymmetric right facet arthrosis at C7-T1. Other neck: No evidence of cervical lymphadenopathy or mass. Upper  chest: Mild centrilobular emphysema and biapical pleuroparenchymal scarring. Calcified granulomas in the left upper lobe. Multiple small nodules in the right upper lobe measuring up to 5 mm. Review of the MIP images confirms the above findings CTA HEAD FINDINGS Anterior circulation: The internal carotid arteries are widely patent from skull base to carotid termini. 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 ACAs and MCAs are patent with moderate right and mild left distal MCA and ACA branch vessel irregularity but no evidence of a proximal branch occlusion. There is no significant M1 or right A1 stenosis. There is mild irregular narrowing of the left A1 segment. Posterior circulation: The intracranial vertebral arteries are widely patent to the basilar. Patent right PICA, left AICA, and bilateral SCA origins are identified. The basilar artery is widely patent. Posterior communicating arteries are diminutive or absent. Both PCAs are patent without evidence of a significant proximal stenosis. No aneurysm is identified. Venous sinuses: Patent. Anatomic variants: None. Review of the MIP images confirms the above findings IMPRESSION: 1. 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 3. Widely patent cervical carotid and vertebral arteries. 4. Right upper lobe pulmonary nodules measuring up to 5 mm. Non-contrast chest CT can be considered in 12 months. This recommendation follows the consensus statement: Guidelines for Management of Incidental Pulmonary Nodules Detected on CT Images: From the Fleischner Society 2017; Radiology 2017; 284:228-243. 5. Aortic Atherosclerosis (ICD10-I70.0) and Emphysema (ICD10-J43.9). Electronically Signed   By: Logan Bores M.D.   On: 02/18/2020 11:53   CT ANGIO NECK W OR WO CONTRAST  Result Date: 02/18/2020 CLINICAL DATA:  Recent seizures and meningitis with left thalamic and left  hippocampal signal abnormality on MRI. EXAM: CT ANGIOGRAPHY HEAD AND NECK TECHNIQUE: Multidetector CT imaging of the head and neck was performed using the standard protocol during bolus administration of intravenous contrast. Multiplanar CT image reconstructions and MIPs were obtained to evaluate the vascular anatomy. Carotid stenosis measurements (when applicable) are obtained utilizing NASCET criteria, using the distal internal carotid diameter as the denominator. CONTRAST:  32mL OMNIPAQUE IOHEXOL 350 MG/ML SOLN COMPARISON:  Head MRI 02/14/2020 FINDINGS: CT HEAD FINDINGS Brain: A 2.5 cm region of hypoattenuation at the ventral aspect  of the left thalamus corresponds to cytotoxic edema on MRI. No new infarct, intracranial hemorrhage, midline shift, or extra-axial fluid collection is identified. Hypodensities in the cerebral white matter bilaterally are nonspecific but compatible with mild chronic small vessel ischemic disease. Vascular: No hyperdense vessel. Skull: No fracture or suspicious osseous lesion. Sinuses: Visualized paranasal sinuses and mastoid air cells are clear. Orbits: Bilateral cataract extraction. Review of the MIP images confirms the above findings CTA NECK FINDINGS Aortic arch: Normal variant aortic arch branching pattern with common origin of the brachiocephalic and left common carotid arteries. Mild atherosclerosis without arch vessel origin stenosis. Right carotid system: Patent with minimal scattered soft plaque in the common carotid and proximal internal carotid artery. No evidence of dissection or stenosis. Left carotid system: Patent with minimal calcified and soft plaque at the carotid bifurcation. No evidence of dissection or stenosis. Vertebral arteries: Patent without evidence of dissection or stenosis. Mildly dominant right vertebral artery. Skeleton: Moderate lower cervical disc degeneration. Asymmetric right facet arthrosis at C7-T1. Other neck: No evidence of cervical  lymphadenopathy or mass. Upper chest: Mild centrilobular emphysema and biapical pleuroparenchymal scarring. Calcified granulomas in the left upper lobe. Multiple small nodules in the right upper lobe measuring up to 5 mm. Review of the MIP images confirms the above findings CTA HEAD FINDINGS Anterior circulation: The internal carotid arteries are widely patent from skull base to carotid termini. 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 ACAs and MCAs are patent with moderate right and mild left distal MCA and ACA branch vessel irregularity but no evidence of a proximal branch occlusion. There is no significant M1 or right A1 stenosis. There is mild irregular narrowing of the left A1 segment. Posterior circulation: The intracranial vertebral arteries are widely patent to the basilar. Patent right PICA, left AICA, and bilateral SCA origins are identified. The basilar artery is widely patent. Posterior communicating arteries are diminutive or absent. Both PCAs are patent without evidence of a significant proximal stenosis. No aneurysm is identified. Venous sinuses: Patent. Anatomic variants: None. Review of the MIP images confirms the above findings IMPRESSION: 1. 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 3. Widely patent cervical carotid and vertebral arteries. 4. Right upper lobe pulmonary nodules measuring up to 5 mm. Non-contrast chest CT can be considered in 12 months. This recommendation follows the consensus statement: Guidelines for Management of Incidental Pulmonary Nodules Detected on CT Images: From the Fleischner Society 2017; Radiology 2017; 284:228-243. 5. Aortic Atherosclerosis (ICD10-I70.0) and Emphysema (ICD10-J43.9). Electronically Signed   By: Logan Bores M.D.   On: 02/18/2020 11:53   DG Swallowing Func-Speech Pathology  Result Date: 02/18/2020 Objective Swallowing Evaluation: Type of Study:  MBS-Modified Barium Swallow Study  Patient Details Name: Damoni Causby MRN: [id number] Date of Birth: 1945-12-21 Today's Date: 02/18/2020 Time: SLP Start Time (ACUTE ONLY): 0355 -SLP Stop Time (ACUTE ONLY): 9741 SLP Time Calculation (min) (ACUTE ONLY): 16 min Past Medical History: No past medical history on file. Past Surgical History: The histories are not reviewed yet. Please review them in the "History" navigator section and refresh this [place]. HPI: 74 y.o. 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  Subjective: pt alert, cooperative but confused Assessment / Plan / Recommendation CHL IP CLINICAL IMPRESSIONS 02/18/2020 Clinical Impression Pt has a mild oropharyngeal dysphagia suspected to be mostly secondary to recent intubation as well as altered mentation. He has mild anterior spillage with thin liquids and prolonged mastication with solids. When boluses reach his pharynx he has mildly reduced base of tongue retraction and hyolaryngeal elevation/excursion. Minimal residuals remain in his pharynx post-swallow, and he is often able to achieve sufficient laryngeal vestibule closure to protect his airway. However, he also has fluctuating timing, and when boluses reach his pyriform sinuses before the swallow and start to spill posteriorly into the laryngeal vestibule, he does not have the ability to squeeze them back out. Suspect reduced glottic sufficiency as thin and nectar thickl barium continues to progress onward past his vocal folds as he swallows. Aspiration is silent and cannot be fully cleared with a cued cough. Airway protection is much better with honey thick liquids and solids,  which are better contained above his valleculae before the swallow. Recommend starting Dys 2 diet and honey thick liquids. Will f/u for potential to advance as mentation improves and with more time post-extubation, as length of intubation was pretty brief.  SLP Visit Diagnosis Dysphagia, pharyngeal phase (R13.13) Attention and concentration deficit following -- Frontal lobe and executive function deficit following -- Impact on safety and function Moderate aspiration risk   CHL IP TREATMENT RECOMMENDATION 02/18/2020 Treatment Recommendations Therapy as outlined in treatment plan below   Prognosis 02/18/2020 Prognosis for Safe Diet Advancement Good Barriers to Reach Goals Cognitive deficits Barriers/Prognosis Comment -- CHL IP DIET RECOMMENDATION 02/18/2020 SLP Diet Recommendations Dysphagia 2 (Fine chop) solids;Honey thick liquids Liquid Administration via Cup;Straw Medication Administration Crushed with puree Compensations Minimize environmental distractions;Slow rate;Small sips/bites Postural Changes Seated upright at 90 degrees   CHL IP OTHER RECOMMENDATIONS 02/18/2020 Recommended Consults -- Oral Care Recommendations Oral care BID Other Recommendations --   CHL IP FOLLOW UP RECOMMENDATIONS 02/18/2020 Follow up Recommendations (No Data)   CHL IP FREQUENCY AND DURATION 02/18/2020 Speech Therapy Frequency (ACUTE ONLY) min 2x/week Treatment Duration 2 weeks      CHL IP ORAL PHASE 02/18/2020 Oral Phase Impaired Oral - Pudding Teaspoon -- Oral - Pudding Cup -- Oral - Honey Teaspoon -- Oral - Honey Cup Reduced posterior propulsion Oral - Nectar Teaspoon -- Oral - Nectar Cup Reduced posterior propulsion Oral - Nectar Straw Reduced posterior propulsion Oral - Thin Teaspoon -- Oral - Thin Cup Left anterior bolus loss;Right anterior bolus loss;Reduced posterior propulsion Oral - Thin Straw Reduced posterior propulsion Oral - Puree Reduced posterior propulsion Oral - Mech Soft Reduced posterior propulsion;Delayed oral  transit Oral - Regular -- Oral - Multi-Consistency -- Oral - Pill -- Oral Phase - Comment --  CHL IP PHARYNGEAL PHASE 02/18/2020 Pharyngeal Phase Impaired Pharyngeal- Pudding Teaspoon -- Pharyngeal -- Pharyngeal- Pudding Cup -- Pharyngeal -- Pharyngeal- Honey Teaspoon -- Pharyngeal -- Pharyngeal- Honey Cup Reduced anterior laryngeal mobility;Reduced laryngeal elevation;Reduced tongue base retraction;Pharyngeal residue - valleculae Pharyngeal -- Pharyngeal- Nectar Teaspoon -- Pharyngeal -- Pharyngeal- Nectar Cup Reduced anterior laryngeal mobility;Reduced laryngeal elevation;Reduced tongue base retraction;Pharyngeal residue - valleculae;Penetration/Aspiration before swallow Pharyngeal Material enters airway, passes BELOW cords without attempt by patient to eject out (silent aspiration) Pharyngeal- Nectar Straw Reduced anterior laryngeal mobility;Reduced laryngeal elevation;Reduced tongue base retraction;Pharyngeal residue - valleculae;Penetration/Aspiration before swallow Pharyngeal Material enters airway, passes BELOW cords without attempt by patient to eject out (silent aspiration) Pharyngeal- Thin Teaspoon -- Pharyngeal -- Pharyngeal- Thin Cup Reduced  anterior laryngeal mobility;Reduced laryngeal elevation;Reduced tongue base retraction;Pharyngeal residue - valleculae;Penetration/Aspiration before swallow Pharyngeal Material enters airway, passes BELOW cords without attempt by patient to eject out (silent aspiration) Pharyngeal- Thin Straw Reduced anterior laryngeal mobility;Reduced laryngeal elevation;Reduced tongue base retraction;Pharyngeal residue - valleculae;Penetration/Aspiration before swallow Pharyngeal Material enters airway, passes BELOW cords without attempt by patient to eject out (silent aspiration) Pharyngeal- Puree Reduced anterior laryngeal mobility;Reduced laryngeal elevation;Reduced tongue base retraction;Pharyngeal residue - valleculae Pharyngeal -- Pharyngeal- Mechanical Soft Reduced anterior  laryngeal mobility;Reduced laryngeal elevation;Reduced tongue base retraction;Pharyngeal residue - valleculae Pharyngeal -- Pharyngeal- Regular -- Pharyngeal -- Pharyngeal- Multi-consistency -- Pharyngeal -- Pharyngeal- Pill -- Pharyngeal -- Pharyngeal Comment --  CHL IP CERVICAL ESOPHAGEAL PHASE 02/18/2020 Cervical Esophageal Phase WFL Pudding Teaspoon -- Pudding Cup -- Honey Teaspoon -- Honey Cup -- Nectar Teaspoon -- Nectar Cup -- Nectar Straw -- Thin Teaspoon -- Thin Cup -- Thin Straw -- Puree -- Mechanical Soft -- Regular -- Multi-consistency -- Pill -- Cervical Esophageal Comment -- Osie Bond., M.A. [facility] Pager [phone] Office [phone] 02/18/2020, 3:48 PM              Scheduled Meds: . [START ON 02/20/2020] amLODipine  5 mg Oral Daily  . [START ON 02/20/2020] aspirin  81 mg Oral Daily  . [START ON 02/20/2020] atorvastatin  80 mg Oral Daily  . Chlorhexidine Gluconate Cloth  6 each Topical Daily  . [START ON 02/20/2020] clopidogrel  75 mg Oral Daily  . docusate  100 mg Oral BID  . heparin  5,000 Units Subcutaneous Q8H  . insulin aspart  0-9 Units Subcutaneous Q4H  . multivitamin with minerals  1 tablet Oral Daily  . pantoprazole (PROTONIX) IV  40 mg Intravenous Daily  . [START ON 02/20/2020] polyethylene glycol  17 g Oral Daily  . potassium chloride  40 mEq Oral Daily  . vancomycin variable dose per unstable renal function (pharmacist dosing)   Does not apply See admin instructions  . [START ON 02/20/2020] vitamin B-12  1,000 mcg Oral Daily   Continuous Infusions: . sodium chloride Stopped (02/18/20 1022)  . acyclovir 690 mg (02/19/20 1343)  . cefTRIAXone (ROCEPHIN)  IV 2 g (02/19/20 1235)  . levETIRAcetam 1,000 mg (02/19/20 1753)    LOS: 6 days   Kerney Elbe, DO [facility] PAGER is on [place]  If 7PM-7AM, please contact night-coverage [email or web address]

## 2020-02-19 NOTE — Progress Notes (Signed)
Inpatient Rehab Admissions Coordinator:    I me with Pt. At bedside and spoke with his daughter, who lives in [place], regarding CIR. Long-term goal is to get patient back to [place], either to his home or to SNF, but they are amenable to stay at [facility] beforehand. I will discuss this with our rehab MD but will plan to pursue for potential admission once Pt. Is medically ready for d/c.  Clemens Catholic, [place], [place] Admissions Coordinator  [phone] (celll) [phone] (office)

## 2020-02-19 NOTE — Consult Note (Signed)
Physical Medicine and Rehabilitation Consult Reason for Consult: Decreased functional mobility with seizure Referring Physician: [facility]   HPI: Howard Allen is a 74 y.o. right-handed male with history of Crohn's disease hypertension hyperlipidemia and depression.  Recent hospitalization for pneumatosis status post ileocolic resection.  Per chart review patient lives alone reportedly independent driving prior to admission.  1 level apartment.  Presented 02/13/2020 with tonic-clonic seizure and altered mental status.  Patient did receive Midazolam.  Seizures resolved and became apneic requiring intubation.  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  EEG negative for seizure.  Echocardiogram with ejection fraction of 55 to 60% no wall motion abnormalities grade 1 diastolic dysfunction.  Admission chemistries potassium 2.8, glucose 333, creatinine 1.91, WBC 17,300, CK 128, lactic acid 2.2, blood cultures no growth to date.  Neurology follow-up patient loaded with Keppra.  Plavix has been initiated for CVA prophylaxis.  Subcutaneous heparin for DVT prophylaxis.  An LP was performed on 02/14/2020 consistent with meningitis cultures no growth to date currently maintained on Rocephin and awaiting duration of antibiotic care and currently on droplet precautions.  Dysphagia #2 honey thick liquid diet.  Therapy evaluations completed with recommendations of physical medicine rehab consult.   Review of Systems  Unable to perform ROS: Acuity of condition   No past medical history on file. Patient unable to give PMS/PSH. Social History:  has no history on file for tobacco use, alcohol use, and drug use. Allergies:  Allergies  Allergen Reactions  . Lisinopril Anaphylaxis, Cough and Other (See Comments)    Other reaction(s): anaphylaxis Pt states it made him "cough his head off"   . Lorazepam     Other reaction(s): Hallucinations    Medications Prior to Admission  Medication Sig Dispense Refill  . albuterol (VENTOLIN HFA) 108 (90 Base) MCG/ACT inhaler Inhale 2 puffs into the lungs every 6 (six) hours as needed for wheezing or shortness of breath.     Marland Kitchen amLODipine (NORVASC) 2.5 MG tablet Take 2.5 mg by mouth daily.    Marland Kitchen atorvastatin (LIPITOR) 20 MG tablet Take 20 mg by mouth daily.     . busPIRone (BUSPAR) 15 MG tablet Take 15 mg by mouth 2 (two) times daily.    . cyproheptadine (PERIACTIN) 4 MG tablet Take 4 mg by mouth 2 (two) times daily.    . fenofibrate 160 MG tablet Take 160 mg by mouth daily.    . metoprolol succinate (TOPROL-XL) 100 MG 24 hr tablet Take 100 mg by mouth daily.    . pantoprazole (PROTONIX) 20 MG tablet Take 20 mg by mouth daily.    . budesonide (ENTOCORT EC) 3 MG 24 hr capsule Take 3 mg by mouth daily.    Marland Kitchen doxazosin (CARDURA) 1 MG tablet Take 1 mg by mouth daily.    . hydrochlorothiazide (HYDRODIURIL) 25 MG tablet Take 25 mg by mouth daily.    . meclizine (ANTIVERT) 25 MG tablet Take 25 mg by mouth 2 (two) times daily as needed for dizziness.       Home: Home Living Family/patient expects to be discharged to:: Private residence Living Arrangements: Alone Type of Home: Apartment Home Access: Level entry [place]: One level Bathroom Shower/Tub: Tub/shower unit (grab bars in shower) Bathroom Toilet: Standard Bathroom Accessibility: Yes [place] [facility] bed, Grab bars - tub/shower Additional Comments: Pt may be poor historian as he would report not remembering some details. Family not present to confirm  home set-up and PLOF details.  Functional History: Prior Function Level of Independence: Independent Comments: Pt reports still driving but not working. Functional Status:  Mobility: Bed Mobility Overal bed mobility: Needs Assistance Bed Mobility: Supine to Sit, Sit to Supine Supine to sit: Mod assist Sit to supine: Min guard General bed mobility comments: Bed flat, cuing  pt to manage LEs off L EOB and modA to ascend trunk and scoot anteriorly to square hips with EOB. Sit to supine with extra time and cues for safety to manage trunk into bed. Transfers Overall transfer level: Needs assistance Equipment used: Rolling walker (2 wheeled) Transfers: Sit to/from Stand Sit to Stand: Mod assist General transfer comment: Cued pt for proper hand placement on bed rather than RW to come to stand, demonstrating unsteadiness and trunk sway, requiring modA to maintain balance and safety. Ambulation/Gait Ambulation/Gait assistance: Mod assist Gait Distance (Feet): 20 Feet Assistive device: Rolling walker (2 wheeled) Gait Pattern/deviations: Decreased step length - right, Decreased step length - left, Decreased stride length, Shuffle, Scissoring, Staggering left, Staggering right, Narrow base of support General Gait Details: Ambulated around end of bed from L side to R of bed with RW at decreased speed. Pt exhibited narrow BOS and dec B step length with some scissoring, resulting in him stepping on his own feet with each step despite cues to correct. Pt unsteady and loses balance L and R, modA for safety and balance., Gait velocity: decreased Gait velocity interpretation: <1.31 ft/sec, indicative of household ambulator    ADL:    Cognition: Cognition Overall Cognitive Status: Impaired/Different from baseline Orientation Level: Disoriented to time, Oriented to person, Disoriented to situation Cognition Arousal/Alertness: Awake/alert Behavior During Therapy: Restless, Impulsive Overall Cognitive Status: Impaired/Different from baseline Area of Impairment: Orientation, Attention, Memory, Following commands, Safety/judgement, Awareness, Problem solving Orientation Level: Disoriented to, Place, Time, Situation Current Attention Level: Selective Memory: Decreased short-term memory, Decreased recall of precautions Following Commands: Follows one step commands inconsistently,  Follows one step commands with increased time Safety/Judgement: Decreased awareness of safety, Decreased awareness of deficits Awareness: Intellectual Problem Solving: Slow processing, Decreased initiation, Difficulty sequencing, Requires verbal cues, Requires tactile cues General Comments: Required repeated cues to not pull at lines during session. Required single step commands along with physical assistance to problem-solve how to manage the RW during gait to turn and sit. Pt unaware of safety concerns.  Blood pressure (!) 162/101, pulse 95, temperature 98.2 F (36.8 C), temperature source Oral, resp. rate 18, height 5\' 10"  (1.778 m), weight 61.1 kg, SpO2 100 %. Physical Exam General: Alert and oriented x 2 (not time), No apparent distress HEENT: Head is normocephalic, atraumatic, PERRLA, EOMI, sclera anicteric, oral mucosa pink and moist, dentition intact, ext ear canals clear,  Neck: Supple without JVD or lymphadenopathy Heart: Reg rate and rhythm. No murmurs rubs or gallops Chest: CTA bilaterally without wheezes, rales, or rhonchi; no distress Abdomen: Soft, non-tender, non-distended, bowel sounds positive. Extremities: Bilateral hands in mitts.  Skin: Scattered abraisons on extremities, cut above right eye.  Neuro: Patient is a bit lethargic but arousable.  Makes eye contact with examiner.  Does state his name but was unable to give date or month. Very limited medical historian.  Follows inconsistent commands. Hypophonic. Abel to lift both legs antigravity for short while. B/l hands in mitts   Psych: Pt's affect is flat. Pt is cooperative    Results for orders placed or performed during the hospital encounter of 02/13/20 (from the past 24 hour(s))  Glucose,  capillary     Status: Abnormal   Collection Time: 02/18/20  4:29 PM  Result Value Ref Range   Glucose-Capillary 109 (H) 70 - 99 mg/dL   Comment 1 Notify RN    Comment 2 Document in Chart   Glucose, capillary     Status: None    Collection Time: 02/18/20  8:23 PM  Result Value Ref Range   Glucose-Capillary 93 70 - 99 mg/dL  Glucose, capillary     Status: None   Collection Time: 02/18/20 11:27 PM  Result Value Ref Range   Glucose-Capillary 82 70 - 99 mg/dL  Glucose, capillary     Status: Abnormal   Collection Time: 02/19/20  3:55 AM  Result Value Ref Range   Glucose-Capillary 103 (H) 70 - 99 mg/dL  Basic metabolic panel     Status: Abnormal   Collection Time: 02/19/20  6:41 AM  Result Value Ref Range   Sodium 135 135 - 145 mmol/L   Potassium 3.8 3.5 - 5.1 mmol/L   Chloride 96 (L) 98 - 111 mmol/L   CO2 24 22 - 32 mmol/L   Glucose, Bld 93 70 - 99 mg/dL   BUN 18 8 - 23 mg/dL   Creatinine, Ser 1.27 (H) 0.61 - 1.24 mg/dL   Calcium 9.4 8.9 - 10.3 mg/dL   GFR, Estimated 56 (L) >60 mL/min   Anion gap 15 5 - 15  Magnesium     Status: Abnormal   Collection Time: 02/19/20  6:41 AM  Result Value Ref Range   Magnesium 1.6 (L) 1.7 - 2.4 mg/dL  Phosphorus     Status: Abnormal   Collection Time: 02/19/20  6:41 AM  Result Value Ref Range   Phosphorus 1.8 (L) 2.5 - 4.6 mg/dL  Glucose, capillary     Status: None   Collection Time: 02/19/20  7:52 AM  Result Value Ref Range   Glucose-Capillary 81 70 - 99 mg/dL  Glucose, capillary     Status: Abnormal   Collection Time: 02/19/20 11:38 AM  Result Value Ref Range   Glucose-Capillary 67 (L) 70 - 99 mg/dL  Glucose, capillary     Status: Abnormal   Collection Time: 02/19/20 12:06 PM  Result Value Ref Range   Glucose-Capillary 61 (L) 70 - 99 mg/dL  Glucose, capillary     Status: Abnormal   Collection Time: 02/19/20 12:30 PM  Result Value Ref Range   Glucose-Capillary 119 (H) 70 - 99 mg/dL   CT ANGIO HEAD W OR WO CONTRAST  Result Date: 02/18/2020 CLINICAL DATA:  Recent seizures and meningitis with left thalamic and left hippocampal signal abnormality on MRI. EXAM: CT ANGIOGRAPHY HEAD AND NECK TECHNIQUE: Multidetector CT imaging of the head and neck was performed using  the standard protocol during bolus administration of intravenous contrast. Multiplanar CT image reconstructions and MIPs were obtained to evaluate the vascular anatomy. Carotid stenosis measurements (when applicable) are obtained utilizing NASCET criteria, using the distal internal carotid diameter as the denominator. CONTRAST:  52mL OMNIPAQUE IOHEXOL 350 MG/ML SOLN COMPARISON:  Head MRI 02/14/2020 FINDINGS: CT HEAD FINDINGS Brain: A 2.5 cm region of hypoattenuation at the ventral aspect of the left thalamus corresponds to cytotoxic edema on MRI. No new infarct, intracranial hemorrhage, midline shift, or extra-axial fluid collection is identified. Hypodensities in the cerebral white matter bilaterally are nonspecific but compatible with mild chronic small vessel ischemic disease. Vascular: No hyperdense vessel. Skull: No fracture or suspicious osseous lesion. Sinuses: Visualized paranasal sinuses and mastoid air cells  are clear. Orbits: Bilateral cataract extraction. Review of the MIP images confirms the above findings CTA NECK FINDINGS Aortic arch: Normal variant aortic arch branching pattern with common origin of the brachiocephalic and left common carotid arteries. Mild atherosclerosis without arch vessel origin stenosis. Right carotid system: Patent with minimal scattered soft plaque in the common carotid and proximal internal carotid artery. No evidence of dissection or stenosis. Left carotid system: Patent with minimal calcified and soft plaque at the carotid bifurcation. No evidence of dissection or stenosis. Vertebral arteries: Patent without evidence of dissection or stenosis. Mildly dominant right vertebral artery. Skeleton: Moderate lower cervical disc degeneration. Asymmetric right facet arthrosis at C7-T1. Other neck: No evidence of cervical lymphadenopathy or mass. Upper chest: Mild centrilobular emphysema and biapical pleuroparenchymal scarring. Calcified granulomas in the left upper lobe. Multiple  small nodules in the right upper lobe measuring up to 5 mm. Review of the MIP images confirms the above findings CTA HEAD FINDINGS Anterior circulation: The internal carotid arteries are widely patent from skull base to carotid termini. 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 ACAs and MCAs are patent with moderate right and mild left distal MCA and ACA branch vessel irregularity but no evidence of a proximal branch occlusion. There is no significant M1 or right A1 stenosis. There is mild irregular narrowing of the left A1 segment. Posterior circulation: The intracranial vertebral arteries are widely patent to the basilar. Patent right PICA, left AICA, and bilateral SCA origins are identified. The basilar artery is widely patent. Posterior communicating arteries are diminutive or absent. Both PCAs are patent without evidence of a significant proximal stenosis. No aneurysm is identified. Venous sinuses: Patent. Anatomic variants: None. Review of the MIP images confirms the above findings IMPRESSION: 1. 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 3. Widely patent cervical carotid and vertebral arteries. 4. Right upper lobe pulmonary nodules measuring up to 5 mm. Non-contrast chest CT can be considered in 12 months. This recommendation follows the consensus statement: Guidelines for Management of Incidental Pulmonary Nodules Detected on CT Images: From the Fleischner Society 2017; Radiology 2017; 284:228-243. 5. Aortic Atherosclerosis (ICD10-I70.0) and Emphysema (ICD10-J43.9). Electronically Signed   By: Logan Bores M.D.   On: 02/18/2020 11:53   CT ANGIO NECK W OR WO CONTRAST  Result Date: 02/18/2020 CLINICAL DATA:  Recent seizures and meningitis with left thalamic and left hippocampal signal abnormality on MRI. EXAM: CT ANGIOGRAPHY HEAD AND NECK TECHNIQUE: Multidetector CT imaging of the head and neck was  performed using the standard protocol during bolus administration of intravenous contrast. Multiplanar CT image reconstructions and MIPs were obtained to evaluate the vascular anatomy. Carotid stenosis measurements (when applicable) are obtained utilizing NASCET criteria, using the distal internal carotid diameter as the denominator. CONTRAST:  23mL OMNIPAQUE IOHEXOL 350 MG/ML SOLN COMPARISON:  Head MRI 02/14/2020 FINDINGS: CT HEAD FINDINGS Brain: A 2.5 cm region of hypoattenuation at the ventral aspect of the left thalamus corresponds to cytotoxic edema on MRI. No new infarct, intracranial hemorrhage, midline shift, or extra-axial fluid collection is identified. Hypodensities in the cerebral white matter bilaterally are nonspecific but compatible with mild chronic small vessel ischemic disease. Vascular: No hyperdense vessel. Skull: No fracture or suspicious osseous lesion. Sinuses: Visualized paranasal sinuses and mastoid air cells are clear. Orbits: Bilateral cataract extraction. Review of the MIP images confirms the above findings CTA NECK FINDINGS Aortic arch: Normal variant aortic arch branching pattern with common origin  of the brachiocephalic and left common carotid arteries. Mild atherosclerosis without arch vessel origin stenosis. Right carotid system: Patent with minimal scattered soft plaque in the common carotid and proximal internal carotid artery. No evidence of dissection or stenosis. Left carotid system: Patent with minimal calcified and soft plaque at the carotid bifurcation. No evidence of dissection or stenosis. Vertebral arteries: Patent without evidence of dissection or stenosis. Mildly dominant right vertebral artery. Skeleton: Moderate lower cervical disc degeneration. Asymmetric right facet arthrosis at C7-T1. Other neck: No evidence of cervical lymphadenopathy or mass. Upper chest: Mild centrilobular emphysema and biapical pleuroparenchymal scarring. Calcified granulomas in the left upper  lobe. Multiple small nodules in the right upper lobe measuring up to 5 mm. Review of the MIP images confirms the above findings CTA HEAD FINDINGS Anterior circulation: The internal carotid arteries are widely patent from skull base to carotid termini. 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 ACAs and MCAs are patent with moderate right and mild left distal MCA and ACA branch vessel irregularity but no evidence of a proximal branch occlusion. There is no significant M1 or right A1 stenosis. There is mild irregular narrowing of the left A1 segment. Posterior circulation: The intracranial vertebral arteries are widely patent to the basilar. Patent right PICA, left AICA, and bilateral SCA origins are identified. The basilar artery is widely patent. Posterior communicating arteries are diminutive or absent. Both PCAs are patent without evidence of a significant proximal stenosis. No aneurysm is identified. Venous sinuses: Patent. Anatomic variants: None. Review of the MIP images confirms the above findings IMPRESSION: 1. 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 3. Widely patent cervical carotid and vertebral arteries. 4. Right upper lobe pulmonary nodules measuring up to 5 mm. Non-contrast chest CT can be considered in 12 months. This recommendation follows the consensus statement: Guidelines for Management of Incidental Pulmonary Nodules Detected on CT Images: From the Fleischner Society 2017; Radiology 2017; 284:228-243. 5. Aortic Atherosclerosis (ICD10-I70.0) and Emphysema (ICD10-J43.9). Electronically Signed   By: Logan Bores M.D.   On: 02/18/2020 11:53   DG Swallowing Func-Speech Pathology  Result Date: 02/18/2020 Objective Swallowing Evaluation: Type of Study: MBS-Modified Barium Swallow Study  Patient Details Name: Alvy Alsop MRN: [id number] Date of Birth: 30-Jun-1945 Today's Date: 02/18/2020 Time:  SLP Start Time (ACUTE ONLY): 4098 -SLP Stop Time (ACUTE ONLY): 1191 SLP Time Calculation (min) (ACUTE ONLY): 16 min Past Medical History: No past medical history on file. Past Surgical History: The histories are not reviewed yet. Please review them in the "History" navigator section and refresh this [place]. HPI: 74 y.o. 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   Subjective: pt alert, cooperative but confused Assessment / Plan / Recommendation CHL IP CLINICAL IMPRESSIONS 02/18/2020 Clinical Impression Pt has a mild oropharyngeal dysphagia suspected to be mostly secondary to recent intubation as well as altered mentation. He has mild anterior spillage with thin liquids and prolonged mastication with solids. When boluses reach his pharynx he has mildly reduced base of tongue retraction and hyolaryngeal elevation/excursion. Minimal residuals remain in his pharynx post-swallow, and he is often able to achieve sufficient laryngeal vestibule closure to protect his  airway. However, he also has fluctuating timing, and when boluses reach his pyriform sinuses before the swallow and start to spill posteriorly into the laryngeal vestibule, he does not have the ability to squeeze them back out. Suspect reduced glottic sufficiency as thin and nectar thickl barium continues to progress onward past his vocal folds as he swallows. Aspiration is silent and cannot be fully cleared with a cued cough. Airway protection is much better with honey thick liquids and solids, which are better contained above his valleculae before the swallow. Recommend starting Dys 2 diet and honey thick liquids. Will f/u for potential to  advance as mentation improves and with more time post-extubation, as length of intubation was pretty brief.  SLP Visit Diagnosis Dysphagia, pharyngeal phase (R13.13) Attention and concentration deficit following -- Frontal lobe and executive function deficit following -- Impact on safety and function Moderate aspiration risk   CHL IP TREATMENT RECOMMENDATION 02/18/2020 Treatment Recommendations Therapy as outlined in treatment plan below   Prognosis 02/18/2020 Prognosis for Safe Diet Advancement Good Barriers to Reach Goals Cognitive deficits Barriers/Prognosis Comment -- CHL IP DIET RECOMMENDATION 02/18/2020 SLP Diet Recommendations Dysphagia 2 (Fine chop) solids;Honey thick liquids Liquid Administration via Cup;Straw Medication Administration Crushed with puree Compensations Minimize environmental distractions;Slow rate;Small sips/bites Postural Changes Seated upright at 90 degrees   CHL IP OTHER RECOMMENDATIONS 02/18/2020 Recommended Consults -- Oral Care Recommendations Oral care BID Other Recommendations --   CHL IP FOLLOW UP RECOMMENDATIONS 02/18/2020 Follow up Recommendations (No Data)   CHL IP FREQUENCY AND DURATION 02/18/2020 Speech Therapy Frequency (ACUTE ONLY) min 2x/week Treatment Duration 2 weeks      CHL IP ORAL PHASE 02/18/2020 Oral Phase Impaired Oral - Pudding Teaspoon -- Oral - Pudding Cup -- Oral - Honey Teaspoon -- Oral - Honey Cup Reduced posterior propulsion Oral - Nectar Teaspoon -- Oral - Nectar Cup Reduced posterior propulsion Oral - Nectar Straw Reduced posterior propulsion Oral - Thin Teaspoon -- Oral - Thin Cup Left anterior bolus loss;Right anterior bolus loss;Reduced posterior propulsion Oral - Thin Straw Reduced posterior propulsion Oral - Puree Reduced posterior propulsion Oral - Mech Soft Reduced posterior propulsion;Delayed oral transit Oral - Regular -- Oral - Multi-Consistency -- Oral - Pill -- Oral Phase - Comment --  CHL IP PHARYNGEAL PHASE 02/18/2020 Pharyngeal Phase Impaired  Pharyngeal- Pudding Teaspoon -- Pharyngeal -- Pharyngeal- Pudding Cup -- Pharyngeal -- Pharyngeal- Honey Teaspoon -- Pharyngeal -- Pharyngeal- Honey Cup Reduced anterior laryngeal mobility;Reduced laryngeal elevation;Reduced tongue base retraction;Pharyngeal residue - valleculae Pharyngeal -- Pharyngeal- Nectar Teaspoon -- Pharyngeal -- Pharyngeal- Nectar Cup Reduced anterior laryngeal mobility;Reduced laryngeal elevation;Reduced tongue base retraction;Pharyngeal residue - valleculae;Penetration/Aspiration before swallow Pharyngeal Material enters airway, passes BELOW cords without attempt by patient to eject out (silent aspiration) Pharyngeal- Nectar Straw Reduced anterior laryngeal mobility;Reduced laryngeal elevation;Reduced tongue base retraction;Pharyngeal residue - valleculae;Penetration/Aspiration before swallow Pharyngeal Material enters airway, passes BELOW cords without attempt by patient to eject out (silent aspiration) Pharyngeal- Thin Teaspoon -- Pharyngeal -- Pharyngeal- Thin Cup Reduced anterior laryngeal mobility;Reduced laryngeal elevation;Reduced tongue base retraction;Pharyngeal residue - valleculae;Penetration/Aspiration before swallow Pharyngeal Material enters airway, passes BELOW cords without attempt by patient to eject out (silent aspiration) Pharyngeal- Thin Straw Reduced anterior laryngeal mobility;Reduced laryngeal elevation;Reduced tongue base retraction;Pharyngeal residue - valleculae;Penetration/Aspiration before swallow Pharyngeal Material enters airway, passes BELOW cords without attempt by patient to eject out (silent aspiration) Pharyngeal- Puree Reduced anterior laryngeal mobility;Reduced laryngeal elevation;Reduced tongue base retraction;Pharyngeal residue - valleculae Pharyngeal -- Pharyngeal- Mechanical Soft Reduced anterior laryngeal mobility;Reduced laryngeal elevation;Reduced  tongue base retraction;Pharyngeal residue - valleculae Pharyngeal -- Pharyngeal- Regular --  Pharyngeal -- Pharyngeal- Multi-consistency -- Pharyngeal -- Pharyngeal- Pill -- Pharyngeal -- Pharyngeal Comment --  CHL IP CERVICAL ESOPHAGEAL PHASE 02/18/2020 Cervical Esophageal Phase WFL Pudding Teaspoon -- Pudding Cup -- Honey Teaspoon -- Honey Cup -- Nectar Teaspoon -- Nectar Cup -- Nectar Straw -- Thin Teaspoon -- Thin Cup -- Thin Straw -- Puree -- Mechanical Soft -- Regular -- Multi-consistency -- Pill -- Cervical Esophageal Comment -- Osie Bond., M.A. [place] [facility] Pager [phone] Office [phone] 02/18/2020, 3:48 PM               Assessment/Plan: Diagnosis: status epilepticus 1. Does the need for close, 24 hr/day medical supervision in concert with the patient's rehab needs make it unreasonable for this patient to be served in a less intensive setting? Yes 2. Co-Morbidities requiring supervision/potential complications: s/p multiple falls, sutures above right eye, fever, leukocytosis, meningitis 3. Due to bladder management, bowel management, safety, skin/wound care, disease management, medication administration, pain management and patient education, does the patient require 24 hr/day rehab nursing? Yes 4. Does the patient require coordinated care of a physician, rehab nurse, therapy disciplines of PT, OT, SLP to address physical and functional deficits in the context of the above medical diagnosis(es)? Yes Addressing deficits in the following areas: balance, endurance, locomotion, strength, transferring, bowel/bladder control, bathing, dressing, feeding, grooming, toileting, cognition, speech and psychosocial support 5. Can the patient actively participate in an intensive therapy program of at least 3 hrs of therapy per day at least 5 days per week? Yes 6. The potential for patient to make measurable gains while on inpatient rehab is excellent 7. Anticipated functional outcomes upon discharge from inpatient rehab are supervision  with PT, supervision with OT,  supervision with SLP. 8. Estimated rehab length of stay to reach the above functional goals is: 12-16 days 9. Anticipated discharge destination: Home 10. Overall Rehab/Functional Prognosis: excellent  RECOMMENDATIONS: This patient's condition is appropriate for continued rehabilitative care in the following setting: CIR Patient has agreed to participate in recommended program. Yes Note that insurance prior authorization may be required for reimbursement for recommended care.  Comment: Thank you for this consult. Admission coordinator to follow.   I have personally performed a face to face diagnostic evaluation, including, but not limited to relevant history and physical exam findings, of this patient and developed relevant assessment and plan.  Additionally, I have reviewed and concur with the physician assistant's documentation above.  Leeroy Cha, MD  Lavon Paganini [place], PA-C 02/19/2020

## 2020-02-19 NOTE — Progress Notes (Addendum)
Pharmacy Antibiotic Note  Howard Allen is a 74 y.o. male admitted on 02/13/2020 with seizures and concern for meningitis. Pharmacy was consulted for vancomycin dosing, along with acyclovir + ampicillin +ceftriaxone per MD.   Pt's vancomycin dose was increased to 750 mg IV Q 12 hrs on 02/18/20 (after vancomycin trough level of 13 mg/L on vancomycin 750 mg IV Q 12 hrs).  Vancomycin trough level this afternoon prior to fourth dose of this regimen was 26 mg/L, which is above the goal range for this pt. Pt was noted to have AKI (baseline Scr unknown) on admission; SCr was 1.44 on admission and has mostly trended down since then, but Scr this afternoon is 1.29, CrCl 44.7 ml/min.  Plan: Hold vancomycin standing order and dose vancomycin using vancomycin levels until vancomycin level <20 mg/L and renal function stabilizes; check vancomycin random level and Scr at 0400 AM tomorrow to evaluate vancomycin clearance and renal function trends to determine subsequent dosing strategy Monitor WBC, temp, cultures, renal function, vancomycin levels  Height: 5\' 10"  (177.8 cm) Weight: 61.1 kg (134 lb 11.2 oz) IBW/kg (Calculated) : 73  Temp (24hrs), Avg:98.4 F (36.9 C), Min:97.8 F (36.6 C), Max:99 F (37.2 C)  Recent Labs  Lab 02/13/20 1617 02/13/20 1926 02/13/20 1930 02/13/20 1935 02/14/20 0418 02/14/20 0418 02/15/20 0322 02/16/20 0729 02/17/20 0329 02/17/20 1530 02/18/20 0419 02/19/20 0641  WBC   < >  --   --   --  9.0  --  6.2 9.4 7.6  --  7.9  --   CREATININE   < >  --    < >  --  1.44*   < > 1.19 1.23 1.18  --  1.18 1.27*  LATICACIDVEN  --  1.9  --  2.2*  --   --   --   --   --   --   --   --   VANCOTROUGH  --   --   --   --   --   --   --   --   --  13*  --   --    < > = values in this interval not displayed.    Estimated Creatinine Clearance: 45.4 mL/min (A) (by C-G formula based on SCr of 1.27 mg/dL (H)).    Allergies  Allergen Reactions  . Lisinopril Anaphylaxis, Cough and Other  (See Comments)    Other reaction(s): anaphylaxis Pt states it made him "cough his head off"   . Lorazepam     Other reaction(s): Hallucinations    Antimicrobials this admission: Vancomycin 10/7 >> Ampicillin 10/7 >> Acyclovir 10/7 >> Ceftriaxone 10/7 >>  Dose adjustments this admission: 10/10: VT= 13 on 500 mg IV q12hr; change to 750 mg IV q12hr 10/12: VT= 26 on 750 mg IV q12hr; dose vancomycin using levels until renal function stabilizes  Microbiology results: 10/6 COVID, flu A, flu B: all negative 10/7 cryptococcal Ag (CSF): negative 10/8 HSV I/II Ab (IgG) (CSF): 2.02 (indicating past or current infection) 10/8 Quantiferon-TB Gold Plus: indeterminate 10/6 Bld cx X 2: NG/final 10/7 MRSA PCR: positive 10/7 CSF: Gram stain: WBCs (PMNs, monos), NOS; cx: NG/final 10/10 CSF AFB smear, cx: pending 10/10 CSF fungal cx: pending  Thank you for allowing pharmacy to be a part of this patient's care.  Gillermina Hu, PharmD, BCPS, [facility] Clinical Pharmacist 02/19/20, [place]

## 2020-02-20 ENCOUNTER — Inpatient Hospital Stay (HOSPITAL_COMMUNITY): Payer: Medicare Other

## 2020-02-20 DIAGNOSIS — D849 Immunodeficiency, unspecified: Secondary | ICD-10-CM

## 2020-02-20 DIAGNOSIS — R634 Abnormal weight loss: Secondary | ICD-10-CM | POA: Diagnosis not present

## 2020-02-20 DIAGNOSIS — G031 Chronic meningitis: Secondary | ICD-10-CM

## 2020-02-20 DIAGNOSIS — G030 Nonpyogenic meningitis: Principal | ICD-10-CM

## 2020-02-20 DIAGNOSIS — R569 Unspecified convulsions: Secondary | ICD-10-CM | POA: Diagnosis not present

## 2020-02-20 LAB — BASIC METABOLIC PANEL
Anion gap: 11 (ref 5–15)
BUN: 15 mg/dL (ref 8–23)
CO2: 24 mmol/L (ref 22–32)
Calcium: 8.8 mg/dL — ABNORMAL LOW (ref 8.9–10.3)
Chloride: 99 mmol/L (ref 98–111)
Creatinine, Ser: 1.2 mg/dL (ref 0.61–1.24)
GFR, Estimated: >60 mL/min (ref >60)
Glucose, Bld: 97 mg/dL (ref 70–99)
Potassium: 3.4 mmol/L — ABNORMAL LOW (ref 3.5–5.1)
Sodium: 134 mmol/L — ABNORMAL LOW (ref 135–145)

## 2020-02-20 LAB — GLUCOSE, CAPILLARY
Glucose-Capillary: 121 mg/dL — ABNORMAL HIGH (ref 70–99)
Glucose-Capillary: 90 mg/dL (ref 70–99)
Glucose-Capillary: 113 mg/dL — ABNORMAL HIGH (ref 70–99)
Glucose-Capillary: 113 mg/dL — ABNORMAL HIGH (ref 70–99)
Glucose-Capillary: 107 mg/dL — ABNORMAL HIGH (ref 70–99)
Glucose-Capillary: 100 mg/dL — ABNORMAL HIGH (ref 70–99)

## 2020-02-20 LAB — ACID FAST SMEAR (AFB, MYCOBACTERIA): Acid Fast Smear: NEGATIVE

## 2020-02-20 LAB — MISC LABCORP TEST (SEND OUT): Labcorp test code: 139800

## 2020-02-20 LAB — PHOSPHORUS: Phosphorus: 3 mg/dL (ref 2.5–4.6)

## 2020-02-20 LAB — VANCOMYCIN, RANDOM: Vancomycin Rm: 16

## 2020-02-20 LAB — LIPID PANEL
Cholesterol: 182 mg/dL (ref 0–200)
HDL: 55 mg/dL (ref >40)
LDL Cholesterol: 82 mg/dL (ref 0–99)
Total CHOL/HDL Ratio: 3.3 RATIO
Triglycerides: 227 mg/dL (ref <150)
VLDL: 45 mg/dL — ABNORMAL HIGH (ref 0–40)

## 2020-02-20 LAB — MAGNESIUM: Magnesium: 2 mg/dL (ref 1.7–2.4)

## 2020-02-20 MED ORDER — METOPROLOL SUCCINATE ER 100 MG PO TB24
100.0000 mg | ORAL_TABLET | Freq: Every day | ORAL | Status: DC
  Administered 2020-02-20 – 2020-02-25 (×6): 100 mg via ORAL
  Filled 2020-02-20 (×6): qty 1

## 2020-02-20 MED ORDER — IOHEXOL 300 MG/ML  SOLN
75.0000 mL | Freq: Once | INTRAMUSCULAR | Status: AC | PRN
  Administered 2020-02-20: 75 mL via INTRAVENOUS

## 2020-02-20 MED ORDER — VANCOMYCIN HCL IN DEXTROSE 1-5 GM/200ML-% IV SOLN
1000.0000 mg | INTRAVENOUS | Status: DC
  Administered 2020-02-20: 1000 mg via INTRAVENOUS
  Filled 2020-02-20: qty 200

## 2020-02-20 NOTE — Progress Notes (Signed)
Physical Therapy Treatment Patient Details Name: Howard Allen MRN: [id number] DOB: 05/08/1946 Today's Date: 02/20/2020    History of Present Illness Pt is a 74 year old male who was having multiple seizures and has a history of falls. 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 NIH 4 on 02/15/20 and then 14 on 02/17/20. Medical hx consisting of Chron's disease and HTN.    PT Comments    Originally planned to attempt gait training this date with use of tech to steady pt while PT facilitates LE proper gait pattern, but pt limited by his uncontrollable, continuous bowel movement upon standing each attempt. He continues to be only oriented to himself and requires repeated single step commands during session. Will continue to follow acutely to address his deficits. Pt would benefit from intensive therapy services secondary to his current level of function varying greatly from his PLOF and his willingness to participate.   Follow Up Recommendations  CIR;Supervision/Assistance - 24 hour     Equipment Recommendations  Rolling walker with 5" wheels;3in1 (PT);Wheelchair (measurements PT);Wheelchair cushion (measurements PT);Hospital bed (may change as pt progresses)    Recommendations for Other Services Rehab consult     Precautions / Restrictions Precautions Precautions: Fall;Other (comment) (seizures; posey wrist restraints) Precaution Comments: seizures, contact precautions, condom cath, posey wrist restraints Restrictions Weight Bearing Restrictions: No    Mobility  Bed Mobility Overal bed mobility: Needs Assistance Bed  Mobility: Supine to Sit;Sit to Supine     Supine to sit: Mod assist Sit to supine: Min guard   General bed mobility comments: Bed flat, cuing pt to bring LEs to EOB and reach L UE across to R bed rail, modA to ascend trunk.  Transfers Overall transfer level: Needs assistance Equipment used: Rolling walker (2 wheeled) Transfers: Sit to/from Stand Sit to Stand: +2 physical assistance;Min assist;Mod assist         General transfer comment: Cued repeated to place hands on bed rather than RW, with min success. Min-modAx2 to maintain safety due to unsteadiness and difficulty powering up to stand.  Ambulation/Gait                 Stairs             Wheelchair Mobility    Modified Rankin (Stroke Patients Only) Modified Rankin (Stroke Patients Only) Pre-Morbid Rankin Score: No symptoms Modified Rankin: Moderately severe disability     Balance Overall balance assessment: History of Falls;Needs assistance Sitting-balance support: Single extremity supported;Feet supported Sitting balance-Leahy Scale: Poor Sitting balance - Comments: Pt sits statically EOB without LOB this date. Postural control: Posterior lean Standing balance support: Bilateral upper extremity supported Standing balance-Leahy Scale: Poor Standing balance comment: Tends to lean posteriorly, correcting moderately when provided VCs and TCs, minAx2 to maintain static standing balance.                            Cognition Arousal/Alertness: Awake/alert Behavior During Therapy: Flat affect Overall Cognitive Status: Impaired/Different from baseline Area of Impairment: Orientation;Attention;Memory;Following commands;Safety/judgement;Awareness;Problem solving  Orientation Level: Disoriented to;Place;Time;Situation Current Attention Level: Sustained Memory: Decreased recall of precautions;Decreased short-term memory Following Commands: Follows one step commands  inconsistently;Follows one step commands with increased time Safety/Judgement: Decreased awareness of safety;Decreased awareness of deficits Awareness: Intellectual Problem Solving: Slow processing;Decreased initiation;Difficulty sequencing;Requires verbal cues;Requires tactile cues General Comments: Required repeated single step commands before pt would initiate, but then still required redirecting. Pt able to state name and DOB. Decreased awareness of uncontrolled bowel movement.      Exercises      General Comments General comments (skin integrity, edema, etc.): Noted active bleeding at R inferior anterior trunk, notified nurse. Notified nurse of uncontrolled, continuous bowel movement upon standing, preventing pt from being able to participate in gait training.      Pertinent Vitals/Pain Pain Assessment: 0-10 Pain Score: 2  Faces Pain Scale: Hurts a little bit Pain Location: head Pain Descriptors / Indicators: Grimacing;Discomfort Pain Intervention(s): Limited activity within patient's tolerance;Monitored during session    Home Living                      Prior Function            PT Goals (current goals can now be found in the care plan section) Acute Rehab PT Goals Patient Stated Goal: to go home PT Goal Formulation: With patient Time For Goal Achievement: 03/04/20 Potential to Achieve Goals: Good Progress towards PT goals: Progressing toward goals    Frequency    Min 4X/week      PT Plan Current plan remains appropriate    Co-evaluation              AM-PAC PT "6 Clicks" Mobility   Outcome Measure  Help needed turning from your back to your side while in a flat bed without using bedrails?: A Little Help needed moving from lying on your back to sitting on the side of a flat bed without using bedrails?: A Lot Help needed moving to and from a bed to a chair (including a wheelchair)?: A Lot Help needed standing up from a chair using your arms  (e.g., wheelchair or bedside chair)?: A Lot Help needed to walk in hospital room?: A Lot Help needed climbing 3-5 steps with a railing? : A Lot 6 Click Score: 13    End of Session Equipment Utilized During Treatment: Gait belt Activity Tolerance: Patient tolerated treatment well Patient left: in bed;with call bell/phone within reach;with bed alarm set;with restraints reapplied Nurse Communication: Mobility status;Other (comment) (bleeding and bowel movement previously mentioned) PT Visit Diagnosis: Unsteadiness on feet (R26.81);Other abnormalities of gait and mobility (R26.89);Muscle weakness (generalized) (M62.81);History of falling (Z91.81);Difficulty in walking, not elsewhere classified (R26.2);Other symptoms and signs involving the nervous system (R29.898)     Time: 9702-6378 PT Time Calculation (min) (ACUTE ONLY): 40 min  Charges:  $Therapeutic Activity: 8-22 mins $Neuromuscular Re-education: 23-37 mins                     Moishe Spice, PT, DPT Acute Rehabilitation Services  Pager: [phone] Office: [place] 02/20/2020, 1:14 PM

## 2020-02-20 NOTE — Progress Notes (Signed)
PROGRESS NOTE    Howard Allen  WNU:[id number] DOB: 08-22-1945 DOA: 02/13/2020 PCP: System, Provider Not In   Brief Narrative: The patient is a 74 year old Caucasian male with a past medical history significant for but not limited to Crohn's disease, hypertension, hyperlipidemia, depression history of hospitalization in June 2021 for pneumatosis status post ileocolonic resection and lysis of adhesions who presented with multiple tonic-clonic seizures complicated by status epilepticus that required intubation and loading with Keppra. CT of the head was negative and initially when he was brought in to the ED by EMS there was at least 3 witnessed generalized tonic-clonic seizures noted. PCCM was consulted for admission given that he was intubated on arrival and unresponsive. He was intubated on 02/13/2020 and extubated 02/15/2020. Neurology was consulted and he underwent further work-up and was found to have a signal abnormality in the left limb is most consistent with an acute infarct. Echocardiogram done showed an EF of 55 to 60% with moderate LVH and grade 1 diastolic dysfunction. EEG testing on 10 9 was negative. 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 He underwent several viral serologies and had a lumbar puncture sent on 02/14/2020. He was started on IV vancomycin, IV ceftriaxone, IV ampicillin and IV acyclovir for suspected meningitis. He was transferred to [facility] service on 02/19/2020 and remains confused. Neurology is following but did not see the patient today and PT OT recommending CIR and are prior to going back to [place] where patient's daughter lives.   Assessment & Plan:   Active Problems:   Seizure ([place])  Meningitis  - LP performed on 10/7 consistent with meningitis - Cultures are NGTD at 3 Days; HSV with positive IgG... pcr negative. - VDLR and cryptococcal antigen negative.,  and [place] serology pending - Quantiferon gold was indeterminate.  Patient remains on vancomycin, ceftriaxone and acyclovir.  He remains encephalopathy.  I had consulted ID this morning.  Just had a very long discussion with Dr. Tommy Medal who recommends repeat LP stat however, unfortunately patient has been on Plavix since 02/15/2020.  I doubt anyone will do LP for next 5 days.  We will try to reach out to IR but as mentioned, I doubt they will do either.  Dr. Drucilla Schmidt is suspecting either fungal or tuberculous meningitis and he is to start this patient on all sorts of empirical therapy now that we cannot do stat LP.  He is also going to talk to patient's daughter.  I highly appreciate ID input in this patient.  We are going to discontinue Plavix in order to get him ready for next LP whenever we can do safely as early as possible.  Acute Ischemic Infarct  -Seen on MRI imaging on 10/7: left thalamic signal abnormality measuring 2.3 cm most consistent with acute infarct; no edema or hemorrhagic conversion. 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 2 mm left paraophthalmic ICA aneurysm. Widely patent cervical carotid and vertebral arteries. Right upper lobe pulmonary nodules measuring up to 5 mm. Aortic Atherosclerosis (ICD10-I70.0) and Emphysema." -  now on atorvastatin 80 mg despite that he takes 20 at home given his acute CVA - TTE negative for vegetations or thrombosis.  -  Continue high intensity statin; Lipid panel done and showed a total cholesterol/HDL ratio 3.3, cholesterol level 154, HDL level 46, LDL 79, triglycerides 146, VLDL 29 -PT OT recommending CIR next-appreciate neurology further evaluation recommendations.  We will continue aspirin but discontinued Plavix for the reasons mentioned  above.  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  Replaced today.  Hypophosphatemia Resolved.  B12 Deficiency  Moderate Malnutrition - Likely due to malnutrition and anorexia, possibly worsened by malabsorption given bowel inflammation seen on imaging the past several months prior to resection. Of note, 70 lb weight loss in the past 10 months.  - Consult to dietician for tube feed initiation, he was started on tube feedings which was discontinued now that he is tolerating dysphagia 2 diet. Nutritionist now recommending hormonal shake twice daily as well as Magic cup twice daily with meals and multivitamin with minerals daily.  HTN/HLD -Home medications include Toprol 100 mg, Amlodipine 5 mg, Lipitor 20 mg, Fenofibrate 160 mg.  Blood pressure was elevated early morning but he had not received his medications.  He was on Toprol-XL 100 prior to admission but currently at 77.  Increase 200.  Continue amlodipine 5 mg.  Blood pressure better now.  Continue as needed labetalol.  Normocytic Anemia  Hx of Iron Deficiency Anemia B12 Deficiency  - B12 levels low. Iron panel shows low saturation, however normal iron and TIBC, unclear picture as not consistent completely with iron deficiency anemia or anemia of chronic disease.  Hemoglobin is stable.  ? Crohn's Disease s/p Ileocolic Resection  - No longer on immunosuppressive therapy. -Takes budesonide as an outpatient but this has not been resumed yet.  AKI on CKD stage II Back to normal.  DVT prophylaxis: SCDs Heparin 5000 units subcu every 8 hours Code Status: FULL CODE  Family Communication: No family present at bedside.  Dr. Tommy Medal to speak to the family today. Disposition Plan: Pending further clinical improvement back to baseline  and clearance by neurology prior to safe discharge disposition  Status is: Inpatient  Remains inpatient appropriate because:Altered mental status, Unsafe d/c plan, IV treatments appropriate due to intensity of illness or inability to take PO and Inpatient level of care appropriate due to severity of illness  Dispo: The patient is from: Home              Anticipated d/c is to: CIR              Anticipated d/c date is: 2 days              Patient currently is not medically stable to d/c.  Consultants:   Neurology  PCCM Transfer   Procedures:  10/6 >> ETT placed 10/7 >> LP 10/8 >> Extubated   Significant Diagnostic Tests:  10/6 CT Head >> No acute intracranial abnormalities; age-related volume loss and chronic small vessel disease 10/7 MRI brain: 1. Signal abnormality in the left thalamus most consistent with an acute infarct. 2. Signal abnormality in the mesial left temporal lobe and splenium of the corpus callosum which may reflect acute infarct, sequelae of recent seizure activity, or encephalitis (including herpes encephalitis). No contralateral cerebral involvement. 3. Mild chronic small vessel ischemic disease. 10/8 echo: LVEF 55-60%, mod LVH, Grade I diastolic dysfunction 29/9 CT Head >> Progressive low density in the left thalamus  related to acute infarction; no hemorrhage 10/9 EEG >> Negative  Micro Data:  10/6 COVID-19 + influenza >> Negative  10/6 Blood Cultures >> NGTD 10/7 CSF Culture >> NGTD 10/7 HSV serology >> 2.02 on igg testing. Will send pcr to determine past or active.  10/7 West Nile serology >>  10/7 VRDL serology >> 10/8 Quanitferon Gold  >> indeterminant 10/9 acid fast csf:  10/11 HSV PCR: pending   Antimicrobials:  Anti-infectives (From admission, onward)   Start     Dose/Rate Route Frequency Ordered Stop   02/20/20 1000  vancomycin (VANCOCIN) IVPB 1000 mg/200 mL premix        1,000 mg 200 mL/hr over 60 Minutes Intravenous Every 24 hours  02/20/20 0555     02/19/20 1811  vancomycin variable dose per unstable renal function (pharmacist dosing)  Status:  Discontinued         Does not apply See admin instructions 02/19/20 1811 02/20/20 0555   02/18/20 0500  vancomycin (VANCOREADY) IVPB 750 mg/150 mL  Status:  Discontinued        750 mg 150 mL/hr over 60 Minutes Intravenous Every 12 hours 02/17/20 1651 02/19/20 1811   02/15/20 0300  vancomycin (VANCOREADY) IVPB 500 mg/100 mL        500 mg 100 mL/hr over 60 Minutes Intravenous Every 12 hours 02/14/20 1352 02/17/20 1755   02/14/20 1400  acyclovir (ZOVIRAX) 690 mg in dextrose 5 % 100 mL IVPB        10 mg/kg  68.9 kg 113.8 mL/hr over 60 Minutes Intravenous Every 12 hours 02/14/20 1245     02/14/20 1400  vancomycin (VANCOREADY) IVPB 1250 mg/250 mL        1,250 mg 166.7 mL/hr over 90 Minutes Intravenous STAT 02/14/20 1352 02/14/20 1655   02/14/20 1300  cefTRIAXone (ROCEPHIN) 2 g in sodium chloride 0.9 % 100 mL IVPB        2 g 200 mL/hr over 30 Minutes Intravenous Every 12 hours 02/14/20 1245     02/14/20 1300  ampicillin (OMNIPEN) 2 g in sodium chloride 0.9 % 100 mL IVPB  Status:  Discontinued        2 g 300 mL/hr over 20 Minutes Intravenous Every 6 hours 02/14/20 1245 02/18/20 1051       Subjective: Patient seen and examined.  Patient very confused but alert.  Not following any commands.  Encephalopathic.  Objective: Vitals:   02/20/20 0211 02/20/20 0349 02/20/20 0819 02/20/20 0915  BP:  (!) 160/94 (!) 146/87 (!) 163/91  Pulse:  80 64 67  Resp:  17 18   Temp:  98.3 F (36.8 C) 97.9 F (36.6 C)   TempSrc:  Axillary Axillary   SpO2:  100% 100%   Weight: 60.8 kg     Height:        Intake/Output Summary (Last 24 hours) at 02/20/2020 1129 Last data filed at 02/19/2020 2250 Gross per 24 hour  Intake 732.3 ml  Output 1050 ml  Net -317.7 ml   Filed Weights   02/18/20 1548 02/19/20 0500 02/20/20 0211  Weight: 59.5 kg 61.1 kg 60.8 kg   Examination: Physical  Exam:  General exam: Appears calm and comfortable, awake but confused Respiratory system: Diminished breath sounds,?  Rhonchi at the bases bilaterally. Respiratory effort normal. Cardiovascular system: S1 & S2 heard, RRR. No JVD, murmurs, rubs, gallops or clicks. No pedal edema. Gastrointestinal system: Abdomen is nondistended, soft and nontender. No organomegaly or masses felt. Normal bowel sounds heard.  Central nervous system: Alert but completely disoriented.  Not following commands.   Data Reviewed: I have personally reviewed following labs and imaging studies  CBC: Recent Labs  Lab 02/13/20 1617 02/13/20 1749 02/14/20 0418 02/14/20 0418 02/15/20 0322 02/16/20 0729 02/16/20 1116 02/17/20 0329 02/18/20 0419  WBC 17.3*   < > 9.0  --  6.2 9.4  --  7.6 7.9  NEUTROABS 14.4*  --   --   --   --   --   --   --   --   HGB 10.9*   < > 9.1*   < > 8.2* 8.5* 8.5* 8.9* 9.5*  HCT 36.3*   < > 27.4*   < > 24.4* 26.7* 25.0* 28.3* 29.4*  MCV 95.8   < > 87.3  --  85.9 88.7  --  88.7 87.0  PLT 461*   < > 281  --  256 289  --  303 217   < > = values in this interval not displayed.   Basic Metabolic Panel: Recent Labs  Lab 02/16/20 0729 02/16/20 0729 02/16/20 1116 02/17/20 0329 02/18/20 0419 02/19/20 0641 02/19/20 1813 02/20/20 0449  NA 140   < > 141 141 137 135  --  134*  K 3.8   < > 3.6 3.8 4.2 3.8  --  3.4*  CL 105  --   --  107 103 96*  --  99  CO2 24  --   --  24 19* 24  --  24  GLUCOSE 128*  --   --  106* 108* 93  --  97  BUN 14  --   --  18 20 18   --  15  CREATININE 1.23   < >  --  1.18 1.18 1.27* 1.29* 1.20  CALCIUM 8.7*  --   --  9.1 9.0 9.4  --  8.8*  MG 1.7  --   --  1.9 2.2 1.6*  --  2.0  PHOS 2.8  --   --  2.8 2.7 1.8*  --  3.0   < > = values in this interval not displayed.   GFR: Estimated Creatinine Clearance: 47.9 mL/min (by C-G formula based on SCr of 1.2 mg/dL). Liver Function Tests: Recent Labs  Lab 02/13/20 1617 02/15/20 0322  AST 32 20  ALT 17 14   ALKPHOS 31* 21*  BILITOT 0.5 0.5  PROT 6.7 5.1*  ALBUMIN 3.7 2.5*   No results for input(s): LIPASE, AMYLASE in the last 168 hours. No results for input(s): AMMONIA in the last 168 hours. Coagulation Profile: No results for input(s): INR, PROTIME in the last 168 hours. Cardiac Enzymes: Recent Labs  Lab 02/13/20 1930  CKTOTAL 128   BNP (last 3 results) No results for input(s): PROBNP in the last 8760 hours. HbA1C: No results for input(s): HGBA1C in the last 72 hours. CBG: Recent Labs  Lab 02/19/20 1559 02/19/20 2016 02/19/20 2335 02/20/20 0346 02/20/20 0834  GLUCAP 95 122* 129* 121* 90   Lipid Profile: Recent Labs    02/18/20 0419 02/19/20 0641  CHOL 154 182  HDL 46 55  LDLCALC 79 82  TRIG 146 227  CHOLHDL 3.3 3.3   Thyroid Function Tests: No results for input(s): TSH, T4TOTAL, FREET4, T3FREE, THYROIDAB in the last 72 hours. Anemia Panel: No results for input(s): VITAMINB12, FOLATE, FERRITIN, TIBC, IRON, RETICCTPCT in the last 72 hours. Sepsis Labs: Recent Labs  Lab 02/13/20 1926 02/13/20 1935  LATICACIDVEN 1.9 2.2*  Recent Results (from the past 240 hour(s))  Respiratory Panel by RT PCR (Flu A&B, Covid) - Nasopharyngeal Swab     Status: None   Collection Time: 02/13/20  4:17 PM   Specimen: Nasopharyngeal Swab  Result Value Ref Range Status   SARS Coronavirus 2 by RT PCR NEGATIVE NEGATIVE Final    Comment: (NOTE) SARS-CoV-2 target nucleic acids are NOT DETECTED.  The SARS-CoV-2 RNA is generally detectable in upper respiratoy specimens during the acute phase of infection. The lowest concentration of SARS-CoV-2 viral copies this assay can detect is 131 copies/mL. A negative result does not preclude SARS-Cov-2 infection and should not be used as the sole basis for treatment or other patient management decisions. A negative result may occur with  improper specimen collection/handling, submission of specimen other than nasopharyngeal swab, presence of  viral mutation(s) within the areas targeted by this assay, and inadequate number of viral copies (<131 copies/mL). A negative result must be combined with clinical observations, patient history, and epidemiological information. The expected result is Negative.  Fact Sheet for Patients:  PinkCheek.be  Fact Sheet for Healthcare Providers:  GravelBags.it  This test is no t yet approved or cleared by the [place] FDA and  has been authorized for detection and/or diagnosis of SARS-CoV-2 by FDA under an Emergency Use Authorization (EUA). This EUA will remain  in effect (meaning this test can be used) for the duration of the COVID-19 declaration under Section 564(b)(1) of the Act, 21 U.S.C. section 360bbb-3(b)(1), unless the authorization is terminated or revoked sooner.     Influenza A by PCR NEGATIVE NEGATIVE Final   Influenza B by PCR NEGATIVE NEGATIVE Final    Comment: (NOTE) The Xpert Xpress SARS-CoV-2/FLU/RSV assay is intended as an aid in  the diagnosis of influenza from Nasopharyngeal swab specimens and  should not be used as a sole basis for treatment. Nasal washings and  aspirates are unacceptable for Xpert Xpress SARS-CoV-2/FLU/RSV  testing.  Fact Sheet for Patients: PinkCheek.be  Fact Sheet for Healthcare Providers: GravelBags.it  This test is not yet approved or cleared by the [place] FDA and  has been authorized for detection and/or diagnosis of SARS-CoV-2 by  FDA under an Emergency Use Authorization (EUA). This EUA will remain  in effect (meaning this test can be used) for the duration of the  Covid-19 declaration under Section 564(b)(1) of the Act, 21  U.S.C. section 360bbb-3(b)(1), unless the authorization is  terminated or revoked. Performed at [facility], [place] [address], [place], [place] [address]   Culture, blood (Routine X 2)  w Reflex to ID Panel     Status: None   Collection Time: 02/13/20  4:57 PM   Specimen: BLOOD LEFT FOREARM  Result Value Ref Range Status   Specimen Description BLOOD LEFT FOREARM  Final   Special Requests   Final    BOTTLES DRAWN AEROBIC AND ANAEROBIC Blood Culture results may not be optimal due to an excessive volume of blood received in culture bottles   Culture   Final    NO GROWTH 5 DAYS Performed at [facility], [place] [address], [place], [place] [address]    Report Status 02/18/2020 FINAL  Final  Culture, blood (Routine X 2) w Reflex to ID Panel     Status: None   Collection Time: 02/13/20  5:02 PM   Specimen: BLOOD LEFT WRIST  Result Value Ref Range Status   Specimen Description BLOOD LEFT WRIST  Final   Special  Requests   Final    BOTTLES DRAWN AEROBIC AND ANAEROBIC Blood Culture results may not be optimal due to an excessive volume of blood received in culture bottles   Culture   Final    NO GROWTH 5 DAYS Performed at [facility], [place] [address], [place], [place] [address]    Report Status 02/18/2020 FINAL  Final  MRSA PCR Screening     Status: Abnormal   Collection Time: 02/14/20  3:03 AM   Specimen: Nasal Mucosa; Nasopharyngeal  Result Value Ref Range Status   MRSA by PCR POSITIVE (A) NEGATIVE Final    Comment:        The GeneXpert MRSA Assay (FDA approved for NASAL specimens only), is one component of a comprehensive MRSA colonization surveillance program. It is not intended to diagnose MRSA infection nor to guide or monitor treatment for MRSA infections. RESULT CALLED TO, READ BACK BY AND VERIFIED WITH: Lillie Fragmin RN 8:25 02/14/20 ([email or web address]) Performed at [facility], [place] [address], [place], [place] [address]   CSF culture     Status: None   Collection Time: 02/14/20 11:01 AM   Specimen: CSF; Cerebrospinal Fluid  Result Value Ref Range Status   Specimen Description CSF  Final   Special Requests NONE  Final   Gram Stain   Final    WBC  PRESENT,BOTH PMN AND MONONUCLEAR NO ORGANISMS SEEN CYTOSPIN SMEAR    Culture   Final    NO GROWTH 3 DAYS Performed at [facility], [place] [address] [place], [place] [address]    Report Status 02/17/2020 FINAL  Final     RN Pressure Injury Documentation:     Estimated body mass index is 19.23 kg/m as calculated from the following:   Height as of this encounter: 5\' 10"  (1.778 m).   Weight as of this encounter: 60.8 kg.  Malnutrition Type:  Nutrition Problem: Moderate Malnutrition   Malnutrition Characteristics:  Signs/Symptoms: mild fat depletion, moderate fat depletion, mild muscle depletion, moderate muscle depletion   Nutrition Interventions:  Interventions: Hormel Shake, Magic cup, MVI   Radiology Studies: DG Swallowing Func-Speech Pathology  Result Date: 02/18/2020 Objective Swallowing Evaluation: Type of Study: MBS-Modified Barium Swallow Study  Patient Details Name: Ronak Duquette MRN: [id number] Date of Birth: 02/22/1946 Today's Date: 02/18/2020 Time: SLP Start Time (ACUTE ONLY): 1412 -SLP Stop Time (ACUTE ONLY): 1428 SLP Time Calculation (min) (ACUTE ONLY): 16 min Past Medical History: No past medical history on file. Past Surgical History: The histories are not reviewed yet. Please review them in the "History" navigator section and refresh this [place]. HPI: 74 y.o. 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   Subjective: pt alert, cooperative but confused Assessment / Plan / Recommendation CHL IP CLINICAL IMPRESSIONS 02/18/2020 Clinical Impression Pt has a mild oropharyngeal dysphagia suspected to be mostly secondary to recent intubation as well  as altered mentation. He has mild anterior spillage with thin liquids and prolonged mastication with solids. When boluses reach his pharynx he has mildly reduced base of tongue retraction and hyolaryngeal elevation/excursion. Minimal residuals remain in his pharynx post-swallow, and he is often able to achieve sufficient laryngeal vestibule closure to protect his airway.  However, he also has fluctuating timing, and when boluses reach his pyriform sinuses before the swallow and start to spill posteriorly into the laryngeal vestibule, he does not have the ability to squeeze them back out. Suspect reduced glottic sufficiency as thin and nectar thickl barium continues to progress onward past his vocal folds as he swallows. Aspiration is silent and cannot be fully cleared with a cued cough. Airway protection is much better with honey thick liquids and solids, which are better contained above his valleculae before the swallow. Recommend starting Dys 2 diet and honey thick liquids. Will f/u for potential to advance as mentation improves and with more time post-extubation, as length of intubation was pretty brief.  SLP Visit Diagnosis Dysphagia, pharyngeal phase (R13.13) Attention and concentration deficit following -- Frontal lobe and executive function deficit following -- Impact on safety and function Moderate aspiration risk   CHL IP TREATMENT RECOMMENDATION 02/18/2020 Treatment Recommendations Therapy as outlined in treatment plan below   Prognosis 02/18/2020 Prognosis for Safe Diet Advancement Good Barriers to Reach Goals Cognitive deficits Barriers/Prognosis Comment -- CHL IP DIET RECOMMENDATION 02/18/2020 SLP Diet Recommendations Dysphagia 2 (Fine chop) solids;Honey thick liquids Liquid Administration via Cup;Straw Medication Administration Crushed with puree Compensations Minimize environmental distractions;Slow rate;Small sips/bites Postural Changes Seated upright at 90 degrees   CHL IP OTHER RECOMMENDATIONS  02/18/2020 Recommended Consults -- Oral Care Recommendations Oral care BID Other Recommendations --   CHL IP FOLLOW UP RECOMMENDATIONS 02/18/2020 Follow up Recommendations (No Data)   CHL IP FREQUENCY AND DURATION 02/18/2020 Speech Therapy Frequency (ACUTE ONLY) min 2x/week Treatment Duration 2 weeks      CHL IP ORAL PHASE 02/18/2020 Oral Phase Impaired Oral - Pudding Teaspoon -- Oral - Pudding Cup -- Oral - Honey Teaspoon -- Oral - Honey Cup Reduced posterior propulsion Oral - Nectar Teaspoon -- Oral - Nectar Cup Reduced posterior propulsion Oral - Nectar Straw Reduced posterior propulsion Oral - Thin Teaspoon -- Oral - Thin Cup Left anterior bolus loss;Right anterior bolus loss;Reduced posterior propulsion Oral - Thin Straw Reduced posterior propulsion Oral - Puree Reduced posterior propulsion Oral - Mech Soft Reduced posterior propulsion;Delayed oral transit Oral - Regular -- Oral - Multi-Consistency -- Oral - Pill -- Oral Phase - Comment --  CHL IP PHARYNGEAL PHASE 02/18/2020 Pharyngeal Phase Impaired Pharyngeal- Pudding Teaspoon -- Pharyngeal -- Pharyngeal- Pudding Cup -- Pharyngeal -- Pharyngeal- Honey Teaspoon -- Pharyngeal -- Pharyngeal- Honey Cup Reduced anterior laryngeal mobility;Reduced laryngeal elevation;Reduced tongue base retraction;Pharyngeal residue - valleculae Pharyngeal -- Pharyngeal- Nectar Teaspoon -- Pharyngeal -- Pharyngeal- Nectar Cup Reduced anterior laryngeal mobility;Reduced laryngeal elevation;Reduced tongue base retraction;Pharyngeal residue - valleculae;Penetration/Aspiration before swallow Pharyngeal Material enters airway, passes BELOW cords without attempt by patient to eject out (silent aspiration) Pharyngeal- Nectar Straw Reduced anterior laryngeal mobility;Reduced laryngeal elevation;Reduced tongue base retraction;Pharyngeal residue - valleculae;Penetration/Aspiration before swallow Pharyngeal Material enters airway, passes BELOW cords without attempt by patient to eject out  (silent aspiration) Pharyngeal- Thin Teaspoon -- Pharyngeal -- Pharyngeal- Thin Cup Reduced anterior laryngeal mobility;Reduced laryngeal elevation;Reduced tongue base retraction;Pharyngeal residue - valleculae;Penetration/Aspiration before swallow Pharyngeal Material enters airway, passes BELOW cords without attempt by patient to eject out (silent aspiration) Pharyngeal- Thin Straw Reduced anterior laryngeal mobility;Reduced laryngeal elevation;Reduced tongue base retraction;Pharyngeal residue - valleculae;Penetration/Aspiration before swallow Pharyngeal Material enters airway, passes BELOW cords without attempt by patient to eject out (silent aspiration) Pharyngeal- Puree Reduced anterior laryngeal mobility;Reduced laryngeal elevation;Reduced tongue base retraction;Pharyngeal residue - valleculae Pharyngeal -- Pharyngeal- Mechanical Soft Reduced anterior laryngeal mobility;Reduced laryngeal elevation;Reduced tongue  base retraction;Pharyngeal residue - valleculae Pharyngeal -- Pharyngeal- Regular -- Pharyngeal -- Pharyngeal- Multi-consistency -- Pharyngeal -- Pharyngeal- Pill -- Pharyngeal -- Pharyngeal Comment --  CH[facility] IP CERVICAL ESOPHAGEAL PHASE 02/18/2020 Cervical Esophageal Phase WFL Pudding Teaspoon -- Pudding Cup -- Honey Teaspoon -- Honey Cup -- Nectar Teaspoon -- Nectar Cup -- Nectar Straw -- Thin Teaspoon -- Thin Cup -- Thin Straw -- Puree -- Mechanical Soft -- Regular -- Multi-consistency -- Pill -- Cervical Esophageal Comment -- Osie Bond., M.A. [facility] Pager [phone] Office [phone] 02/18/2020, 3:48 PM              Scheduled Meds: . amLODipine  5 mg Oral Daily  . aspirin  81 mg Oral Daily  . atorvastatin  80 mg Oral Daily  . Chlorhexidine Gluconate Cloth  6 each Topical Daily  . clopidogrel  75 mg Oral Daily  . docusate  100 mg Oral BID  . fenofibrate  160 mg Oral Daily  . heparin  5,000 Units Subcutaneous Q8H  . insulin aspart  0-9 Units Subcutaneous Q4H   . metoprolol succinate  100 mg Oral Daily  . multivitamin with minerals  1 tablet Oral Daily  . pantoprazole (PROTONIX) IV  40 mg Intravenous Daily  . polyethylene glycol  17 g Oral Daily  . potassium chloride  40 mEq Oral Daily  . vitamin B-12  1,000 mcg Oral Daily   Continuous Infusions: . sodium chloride Stopped (02/18/20 1022)  . acyclovir 690 mg (02/20/20 0924)  . cefTRIAXone (ROCEPHIN)  IV 2 g (02/20/20 1049)  . levETIRAcetam 1,000 mg (02/20/20 0416)  . vancomycin      LOS: 7 days   Darliss Cheney, MD [facility] PAGER is on [place]  If 7PM-7AM, please contact night-coverage [email or web address]

## 2020-02-20 NOTE — Progress Notes (Signed)
  Speech Language Pathology Treatment: Dysphagia  Patient Details Name: Howard Allen MRN: [id number] DOB: Mar 14, 1946 Today's Date: 02/20/2020 Time: 3299-2426 SLP Time Calculation (min) (ACUTE ONLY): 15 min  Assessment / Plan / Recommendation Clinical Impression  Pt demonstrated minimal oral residue with ground (D2) sausage and imitated tongue sweep and cued to perform throughout meal and at the end. Vocal quality wet x 1 at end of session could be indicative of airway intrusion but not overly concerned due to infrequency. He needed cue x 1 to maintain alertness. May plan to initiate exercises for hyolaryngeal elevation and facilitate tongue base retraction. Continue honey thick liquids and Dys 2 texture is appropriate.    HPI HPI: 74 y.o. 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        SLP Plan  Continue with current plan of care       Recommendations  Diet recommendations: Dysphagia 2 (fine chop);Honey-thick liquid Liquids provided via: Cup Medication Administration: Crushed with puree Supervision: Patient able to self feed;Full supervision/cueing for compensatory strategies Compensations: Slow rate;Small sips/bites;Lingual sweep for clearance of pocketing;Clear throat intermittently Postural Changes and/or Swallow Maneuvers: Seated upright 90 degrees                Oral Care Recommendations: Oral care BID Follow up Recommendations: 24 hour supervision/assistance SLP Visit Diagnosis: Dysphagia, pharyngeal phase (R13.13) Plan: Continue with current plan of care                      Houston Siren 02/20/2020, 11:45 AM Orbie Pyo Colvin Caroli.Ed Risk analyst [phone] Office [phone]

## 2020-02-20 NOTE — Progress Notes (Signed)
Subjective: No acute events overnight.  No clinical seizures.  Denies any headache.  ROS: unable to obtain due to poor mental status  Examination  Vital signs in last 24 hours: Temp:  [97.8 F (36.6 C)-99.1 F (37.3 C)] 97.9 F (36.6 C) (10/13 0819) Pulse Rate:  [64-122] 67 (10/13 0915) Resp:  [17-19] 18 (10/13 0819) BP: (146-178)/(87-104) 163/91 (10/13 0915) SpO2:  [99 %-100 %] 100 % (10/13 0819) Weight:  [60.8 kg] 60.8 kg (10/13 0211)  General: lying in bed, not in apparent distress CVS: pulse-normal rate and rhythm RS: breathing comfortably, coarse bilaterally Extremities: normal, warm Neuro: Opens eyes to verbal stimuli, oriented to self, not to place (initially said he [place] at a mall but was able to say hospital when he was given 3 options), follows simple one-step commands, was able to tell me 2+2=4 but then perseverated and was not able to answer any further questions.  Spontaneously moving all 4 extremities in bed, difficult to assess strength as patient has trouble following complicated commands.   Basic Metabolic Panel: Recent Labs  Lab 02/16/20 0729 02/16/20 0729 02/16/20 1116 02/17/20 0329 02/17/20 0329 02/18/20 0419 02/19/20 0641 02/19/20 1813 02/20/20 0449  NA 140   < > 141 141  --  137 135  --  134*  K 3.8   < > 3.6 3.8  --  4.2 3.8  --  3.4*  CL 105  --   --  107  --  103 96*  --  99  CO2 24  --   --  24  --  19* 24  --  24  GLUCOSE 128*  --   --  106*  --  108* 93  --  97  BUN 14  --   --  18  --  20 18  --  15  CREATININE 1.23   < >  --  1.18  --  1.18 1.27* 1.29* 1.20  CALCIUM 8.7*   < >  --  9.1   < > 9.0 9.4  --  8.8*  MG 1.7  --   --  1.9  --  2.2 1.6*  --  2.0  PHOS 2.8  --   --  2.8  --  2.7 1.8*  --  3.0   < > = values in this interval not displayed.    CBC: Recent Labs  Lab 02/13/20 1617 02/13/20 1749 02/14/20 0418 02/14/20 0418 02/15/20 0322 02/16/20 0729 02/16/20 1116 02/17/20 0329 02/18/20 0419  WBC 17.3*   < > 9.0  --  6.2 9.4   --  7.6 7.9  NEUTROABS 14.4*  --   --   --   --   --   --   --   --   HGB 10.9*   < > 9.1*   < > 8.2* 8.5* 8.5* 8.9* 9.5*  HCT 36.3*   < > 27.4*   < > 24.4* 26.7* 25.0* 28.3* 29.4*  MCV 95.8   < > 87.3  --  85.9 88.7  --  88.7 87.0  PLT 461*   < > 281  --  256 289  --  303 217   < > = values in this interval not displayed.     Coagulation Studies: No results for input(s): LABPROT, INR in the last 72 hours.  Imaging No new brain imaging overnight   ASSESSMENT AND PLAN: 74 year old male who presented with new onset status epilepticus and was found to have meningitis.  New onset status epilepticus (resolved) Meningitis Acute encephalopathy, infectious -LP showed 140 WBCs with 64% neutrophils and 32% lymphocytes, protein 112 and glucose less than 20.  Seizures can occasionally cause pleocytosis.  However patient was borderline febrile (temperature 100.3 F) and had behavioral changes preceding the seizure for a few days which along with CSF findings is very concerning for meningitis.  HSV IgG was elevated at 2.02 but PCR came back negative.  VDRL is negative, cryptococcal antigen is negative.  QuantiFERON gold was ordered by ICU attending due to CSF findings in the setting of recent immunosuppression which was initially indeterminate and later negative.  Arbovirus panel is pending as well as AFB smear and culture.  Recommendations -On antibiotics and acyclovir per critical care team.  Consider ID consult to see how long patient needs to be on antibiotics, is it okay to discontinue acyclovir now that HSV PCR is negative and if there are any further tests needed on CSF -As patient has not had any further seizures, continue Keppra 1000 mg twice daily -MRI brain on 02/14/2020 showed signal abnormality in the left thalamus concerning for acute infarct versus seizure related.  Recommend MRI brain without contrast in 8 to 12 weeks to see if MRI changes resolved which would make it more likely that  these were seizure related. -In the interim patient is on aspirin and Plavix (for 3 weeks) as well as atorvastatin 80 mg daily for secondary stroke prevention -Continue seizure precautions  -as needed IV Ativan 2 mg for clinical seizure-like activity   I have spent a total of  35  minutes with the patient reviewing hospital notes,  test results, labs and examining the patient as well as establishing an assessment and plan that was discussed personally with the patient's physician Dr. Doristine Bosworth.  > 50% of time was spent in direct patient care.   Zeb Comfort Epilepsy [facility] For questions after 5pm please refer to AMION to reach the Neurologist on call

## 2020-02-20 NOTE — Progress Notes (Signed)
Pharmacy Antibiotic Note  Howard Allen is a 74 y.o. male admitted on 02/13/2020 with seizures and concern for meningitis. Pharmacy was consulted for vancomycin dosing, along with acyclovir + ampicillin +ceftriaxone per MD.   Pt's vancomycin dose was increased to 750 mg IV Q 12 hrs on 02/18/20 (after vancomycin trough level of 13 mg/L on vancomycin 750 mg IV Q 12 hrs).  Vancomycin trough level this afternoon prior to fourth dose of this regimen was 26 mg/L, which is above the goal range for this pt. Pt was noted to have AKI (baseline Scr unknown) on admission; SCr was 1.44 on admission and has mostly trended down since then, but Scr this afternoon is 1.29, CrCl 44.7 ml/min.  10/13 AM update:  Vancomycin random 16 SCr 1.29>>>1.2  Plan: Re-start vancomycin 1000 mg IV q24h Monitor renal function closely  Re-check vancomycin levels as needed  Height: 5\' 10"  (177.8 cm) Weight: 60.8 kg (134 lb 0.6 oz) IBW/kg (Calculated) : 73  Temp (24hrs), Avg:98.5 F (36.9 C), Min:97.8 F (36.6 C), Max:99.1 F (37.3 C)  Recent Labs  Lab 02/13/20 1617 02/13/20 1926 02/13/20 1930 02/13/20 1935 02/14/20 0418 02/14/20 0418 02/15/20 0322 02/15/20 0322 02/16/20 0729 02/16/20 0729 02/17/20 0329 02/17/20 1530 02/18/20 0419 02/19/20 0641 02/19/20 1645 02/19/20 1813 02/20/20 0449  WBC   < >  --   --   --  9.0  --  6.2  --  9.4  --  7.6  --  7.9  --   --   --   --   CREATININE   < >  --    < >  --  1.44*   < > 1.19   < > 1.23   < > 1.18  --  1.18 1.27*  --  1.29* 1.20  LATICACIDVEN  --  1.9  --  2.2*  --   --   --   --   --   --   --   --   --   --   --   --   --   VANCOTROUGH  --   --   --   --   --   --   --   --   --   --   --  13*  --   --  98*  --   --   VANCORANDOM  --   --   --   --   --   --   --   --   --   --   --   --   --   --   --   --  16   < > = values in this interval not displayed.    Estimated Creatinine Clearance: 47.9 mL/min (by C-G formula based on SCr of 1.2 mg/dL).     Allergies  Allergen Reactions  . Lisinopril Anaphylaxis, Cough and Other (See Comments)    Other reaction(s): anaphylaxis Pt states it made him "cough his head off"   . Lorazepam     Other reaction(s): Hallucinations    Antimicrobials this admission: Vancomycin 10/7 >> Ampicillin 10/7 >> Acyclovir 10/7 >> Ceftriaxone 10/7 >>  Dose adjustments this admission: 10/10: VT= 13 on 500 mg IV q12hr; change to 750 mg IV q12hr 10/12: VT= 26 on 750 mg IV q12hr; dose vancomycin using levels until renal function stabilizes 10/13 VR: 16, start vancomycin 1000 mg IV q24h  Microbiology results: 10/6 COVID, flu A, flu B: all  negative 10/7 cryptococcal Ag (CSF): negative 10/8 HSV I/II Ab (IgG) (CSF): 2.02 (indicating past or current infection) 10/8 Quantiferon-TB Gold Plus: indeterminate 10/6 Bld cx X 2: NG/final 10/7 MRSA PCR: positive 10/7 CSF: Gram stain: WBCs (PMNs, monos), NOS; cx: NG/final 10/10 CSF AFB smear, cx: pending 10/10 CSF fungal cx: pending  Narda Bonds, PharmD, BCPS Clinical Pharmacist Phone: [phone]

## 2020-02-20 NOTE — Progress Notes (Signed)
Inpatient Rehab Admissions Coordinator:   Following discussion with rehab MD, I will not pursue admission for this patient due to lack of support at d/c. CIR to sign off.   Clemens Catholic, [place], [place] Admissions Coordinator  [phone] ([place]) [phone] (office)

## 2020-02-20 NOTE — Evaluation (Signed)
Speech Language Pathology Evaluation Patient Details Name: Howard Allen MRN: [id number] DOB: [birth year] Today's Date: 02/20/2020 Time: 9163-8466 SLP Time Calculation (min) (ACUTE ONLY): 33 min  Problem List:  Patient Active Problem List   Diagnosis Date Noted  . Seizure ([place]) 02/13/2020  . Hypokalemia   . Status epilepticus ([place])   . Hypotension    Past Medical History: No past medical history on file. Past Surgical History:  The histories are not reviewed yet. Please review them in the "History" navigator section and refresh this [place]. HPI:  75 y.o. 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     Assessment / Plan / Recommendation Clinical Impression  Pt exhibits cognitive impairments following stroke. He was only oriented to self, but able to recall [facility] after 5 minutes. Impairments also present in the areas of awareness, speech intelligibility, problem solving, attention and memory. Upon arrival his restraint was loose and he had been picking scab on forehead and bleeding (RN made aware) and external catheter on floor. Pt will need 24 hour supervision at discharge. ST will work with pt while on acute.       SLP Assessment  SLP Recommendation/Assessment: Patient needs continued Speech Lanaguage Pathology Services SLP Visit Diagnosis: Cognitive communication deficit (R41.841)    Follow Up Recommendations  24 hour supervision/assistance    Frequency and Duration min 2x/week  2 weeks      SLP Evaluation Cognition  Overall Cognitive Status: Impaired/Different from baseline Arousal/Alertness: Awake/alert Orientation Level: Oriented to  person;Disoriented to place;Disoriented to time;Disoriented to situation Attention: Sustained Sustained Attention: Impaired Sustained Attention Impairment: Verbal basic Awareness: Impaired Awareness Impairment: Intellectual impairment;Emergent impairment;Anticipatory impairment Problem Solving: Impaired Problem Solving Impairment: Verbal basic;Functional basic Executive Function: Self Monitoring;Self Correcting;Reasoning;Organizing Safety/Judgment: Impaired       Comprehension  Auditory Comprehension Overall Auditory Comprehension: Appears within functional limits for tasks assessed Visual Recognition/Discrimination Discrimination: Not tested Reading Comprehension Reading Status: Not tested    Expression Expression Primary Mode of Expression: Verbal Verbal Expression Overall Verbal Expression: Impaired Initiation: No impairment Level of Generative/Spontaneous Verbalization: Sentence Repetition:  (NT) Naming:  (functional for single words) Written Expression Dominant Hand: Right Written Expression: Not tested   Oral / Motor  Oral Motor/Sensory Function Overall Oral Motor/Sensory Function: Mild impairment Facial ROM: Reduced right Motor Speech Overall Motor Speech: Impaired Respiration: Within functional limits Phonation: Low vocal intensity Articulation: Impaired Level of Impairment: Conversation Intelligibility: Intelligibility reduced Word: 75-100% accurate Phrase: 75-100% accurate Sentence: 75-100% accurate Motor Planning: Witnin functional limits   Howard Allen                    Howard Allen 02/20/2020, 4:28 PM Howard Allen M.Ed Risk analyst [phone] Office [phone]

## 2020-02-20 NOTE — Consult Note (Addendum)
Date of Admission:  02/13/2020          Reason for Consult: Aseptic meningoencephalitis   Referring Provider: Dr. Doristine Bosworth   Assessment:  1. 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 Seizures 3. Tremor x several months with difficulty walking 4. 70# weight loss since December of 2020 5. Chronic headaches 6. Treatment for presumed Crohn's disease with at least two doses of Humira in April, May 7. Hospitalization in June of 2021 with pnemotosis sp ileocolic resection and lysis of adhesions  Plan:  1. IF possible repeat LP tomorrow with LARGE VOLUME LP = FILL ALL 4 TUBES WITH CSF TO TOP OF TUBE 2. Repeat CSF for HSV PCR 3. Use dedicated 8-10 cc CSF and spin down fluid, use sediment --> AFB stain and culture, save supernatant 4. "" CSF "" --> sediment for CSF fungal culture x 6 weeks incubation 5. Repeat CSF cell count and differential, GLUCOSE and protein 6. Send fresh 8 ml of CSF for flow cytometry 7. Send new CSF for blastomyces ag, coccidioides antibodies, histoplasma ag, repeat MTB by PCR 8. SAVE CSF 9. I have sent 0.5 cc remaining CSF from first LP for histoplasma ag 10. I will send urine histoplasma, blastomyces ag, serum Coccidioides antibodies 11. CT chest with contrast and consider CT abdomen and pelvis w contrast 12. Continue acyclovir for now 13. DC ceftriaxone today 14. Will dc vancomycin tomorrow 15. IF LP not capable of being done in the next few days I would favor initiating empiric therapy for usual suspects for chronic meningitis with IV amphotericin for dimorphic fungi, continued steroids and 4 drug therapy for CNS TB  Active Problems:   Seizure ([place])   Scheduled Meds: . amLODipine  5 mg Oral Daily  . aspirin  81 mg Oral Daily  . atorvastatin  80 mg Oral Daily  . Chlorhexidine Gluconate Cloth  6 each Topical Daily  . docusate  100 mg Oral BID  . fenofibrate  160 mg Oral Daily  . heparin  5,000 Units Subcutaneous Q8H  .  insulin aspart  0-9 Units Subcutaneous Q4H  . metoprolol succinate  100 mg Oral Daily  . multivitamin with minerals  1 tablet Oral Daily  . pantoprazole (PROTONIX) IV  40 mg Intravenous Daily  . polyethylene glycol  17 g Oral Daily  . potassium chloride  40 mEq Oral Daily  . vitamin B-12  1,000 mcg Oral Daily   Continuous Infusions: . sodium chloride Stopped (02/18/20 1022)  . acyclovir 690 mg (02/20/20 0924)  . cefTRIAXone (ROCEPHIN)  IV 2 g (02/20/20 1049)  . levETIRAcetam 1,000 mg (02/20/20 0416)  . vancomycin 1,000 mg (02/20/20 1244)   PRN Meds:.albuterol, docusate, labetalol  HPI: Howard Allen is a 74 y.o. male who has hx of hyperlipidemia, asthma who lost his wife (married x 50 years) within the past year. Since Christmas of 2020 he has loste 70# per his daughter, largely she believes because he does not want to eat and has suffered from depression in grieving the loss of his wife. IN the interim he was diagnosed with possible Crohn's disease and given at least 2 doses of humira. He had developed a tremor and difficulty walking with LE weakness. and was evaluated in June of 2021 by Neurology at [facility] in [place]. Neurologist there performed MRI which was unrevealing and could not find explanation for tremor or weakness. At that time the patient was also suffering from nausea. In talking to his daughter  he has had chronic headaches but they would typically get better with food as would his tremors. In the interim he was hospitalized and underwent emergent laparascopic ieocolic resection for SBO.   General Surgeon at [facility] [place] who saw the patient in August stated that they saw no evidence of Crohn's in the OR and pathology was negative for Crohn's disease.  His daughter states that he then regained 10# post surgery but then went back to losing weight.   The patient was vacationing with his family including his daughter in [place], where they went to [facility]. They were  returning to [place] but along the way he had several falls with seizure like activity. He was ultimately brought to [facility] ER with tonic clniic seizures. Temperature here was 100.7 and he had wbc to 17k.   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 He underwent LP on October 7th, 2021 at 1101 from what I can tell PRIOR to antimicrobials being given.  LP showed:  140 WBC with 64% PMN's , 32% lymphs  There was only 1 RBC  Protein of 112 and glucose <20  HSV  1.2 antibodies were + but HSV 1, 2 PCR's were negative   He was then placed on IV acyclovir, Ampicillin, Ceftriaxone, vancomycin and dexamethasone.  CSF cultures have been negative.   CSF crypto ag negative and VDRL negative.  Serum QF gold inderterminate and negative on 2 draws here.  His CSF was also sent for AFB culture (though I doubt done on large volume CSF), fungal cultures (similar concern)  MTB PCR  In talking to his daughter, the patient used to travel extensively when she was young including to the [facility]. She does not know of TB exposure  He remains confused but is afebrile.  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   While his MRI COULD be c/w HSV encephalitis and HSV PCR can be falsely negative, I doubt he has HSV. The PROFOUND low CSF glucose would NOT fit with this. Additionally HSV typically will show RBC on CSF and he had only 1 RBC.  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  Unfortunately he has been given plavix which might dissuade IR or Neurology from performing LP. (I myself cannot do LP at present).  It would be MUCH more ideal if we could get a repeat  large volume LP as above to try to have lab data to give [place] some definitive answers in the future.  However I am reluctant to leave him untreated for such entities for long esp with CSF glucose as low as it has been.  In the end we may be pushed into empiric therapy for TB, fungal meningitis.  I have reviewed this case with my partner Dr. Megan Salon.    Review of Systems: Review of Systems  Unable to perform ROS: Medical condition    No past medical history on file.  Social History   Tobacco Use  . Smoking status: Not on file  Substance Use Topics  . Alcohol use: Not on file  . Drug use: Not on file    No family history on file. Allergies  Allergen Reactions  . Lisinopril Anaphylaxis, Cough and Other (See Comments)    Other reaction(s): anaphylaxis Pt states it made him "cough his head off"   . Lorazepam     Other reaction(s): Hallucinations    OBJECTIVE: Blood pressure (!) 152/77, pulse 77, temperature 98.1 F (36.7 C), temperature source Oral, resp. rate 18, height 5\' 10"  (1.778 m), weight 60.8 kg, SpO2 100 %.  Physical Exam Constitutional:      Appearance: He is well-developed.  HENT:     Head: Normocephalic.   Eyes:     Conjunctiva/sclera: Conjunctivae normal.  Cardiovascular:     Rate and Rhythm: Normal rate and regular rhythm.     Heart sounds: No murmur heard.  No friction rub. No gallop.   Pulmonary:     Effort: Pulmonary effort is normal. No respiratory distress.     Breath sounds: No wheezing.  Abdominal:     General: Bowel sounds are normal. There is no distension.     Palpations: Abdomen is soft.  Musculoskeletal:        General: No tenderness. Normal range of motion.     Cervical back: Normal range of motion and neck supple.  Skin:    General: Skin is warm and dry.     Coloration: Skin is not pale.     Findings: Lesion present. No erythema or rash.  Neurological:     General: No focal deficit present.     Mental Status: He is alert. He is  confused.  Psychiatric:        Speech: Speech is delayed.        Cognition and Memory: Cognition is impaired. Memory is impaired. He exhibits impaired recent memory and impaired remote memory.     Lab Results Lab Results  Component Value Date   WBC 7.9 02/18/2020   HGB 9.5 (L) 02/18/2020   HCT 29.4 (L) 02/18/2020   MCV 87.0 02/18/2020   PLT 217 02/18/2020    Lab Results  Component Value Date   CREATININE 1.20 02/20/2020   BUN 15 02/20/2020   NA 134 (L) 02/20/2020   K 3.4 (L) 02/20/2020   CL 99 02/20/2020   CO2 24 02/20/2020    Lab Results  Component Value Date   ALT 14 02/15/2020   AST 20 02/15/2020   ALKPHOS 21 (L) 02/15/2020   BILITOT 0.5 02/15/2020     Microbiology: Recent Results (from the past 240 hour(s))  Respiratory Panel by RT PCR (Flu A&B, Covid) - Nasopharyngeal Swab     Status: None   Collection Time: 02/13/20  4:17 PM   Specimen: Nasopharyngeal Swab  Result Value Ref Range Status   SARS Coronavirus 2 by RT PCR NEGATIVE NEGATIVE Final    Comment: (NOTE) SARS-CoV-2 target nucleic acids are NOT DETECTED.  The SARS-CoV-2 RNA is generally detectable in upper respiratoy specimens during the acute phase of infection. The lowest concentration of SARS-CoV-2 viral copies this assay can detect is 131 copies/mL. A negative result does not preclude SARS-Cov-2 infection and should not be used as the sole basis for treatment or other patient management decisions. A negative result may occur with  improper specimen collection/handling, submission of specimen other than nasopharyngeal swab, presence of viral mutation(s) within the areas targeted by this assay, and inadequate number of viral copies (<131 copies/mL). A negative result must be combined with clinical observations, patient history, and epidemiological information. The expected result is Negative.  Fact Sheet for Patients:  PinkCheek.be  Fact Sheet for  Healthcare  Providers:  GravelBags.it  This test is no t yet approved or cleared by the [place] and  has been authorized for detection and/or diagnosis of SARS-CoV-2 by FDA under an Emergency Use Authorization (EUA). This EUA will remain  in effect (meaning this test can be used) for the duration of the COVID-19 declaration under Section 564(b)(1) of the Act, 21 U.S.C. section 360bbb-3(b)(1), unless the authorization is terminated or revoked sooner.     Influenza A by PCR NEGATIVE NEGATIVE Final   Influenza B by PCR NEGATIVE NEGATIVE Final    Comment: (NOTE) The Xpert Xpress SARS-CoV-2/FLU/RSV assay is intended as an aid in  the diagnosis of influenza from Nasopharyngeal swab specimens and  should not be used as a sole basis for treatment. Nasal washings and  aspirates are unacceptable for Xpert Xpress SARS-CoV-2/FLU/RSV  testing.  Fact Sheet for Patients: PinkCheek.be  Fact Sheet for Healthcare Providers: GravelBags.it  This test is not yet approved or cleared by the [place] FDA and  has been authorized for detection and/or diagnosis of SARS-CoV-2 by  FDA under an Emergency Use Authorization (EUA). This EUA will remain  in effect (meaning this test can be used) for the duration of the  Covid-19 declaration under Section 564(b)(1) of the Act, 21  U.S.C. section 360bbb-3(b)(1), unless the authorization is  terminated or revoked. Performed at [facility], [place] [address] [place], [place] [address]   Culture, blood (Routine X 2) w Reflex to ID Panel     Status: None   Collection Time: 02/13/20  4:57 PM   Specimen: BLOOD LEFT FOREARM  Result Value Ref Range Status   Specimen Description BLOOD LEFT FOREARM  Final   Special Requests   Final    BOTTLES DRAWN AEROBIC AND ANAEROBIC Blood Culture results may not be optimal due to an excessive volume of blood received in culture bottles    Culture   Final    NO GROWTH 5 DAYS Performed at [facility], [place] [address], [place], [place] [address]    Report Status 02/18/2020 FINAL  Final  Culture, blood (Routine X 2) w Reflex to ID Panel     Status: None   Collection Time: 02/13/20  5:02 PM   Specimen: BLOOD LEFT WRIST  Result Value Ref Range Status   Specimen Description BLOOD LEFT WRIST  Final   Special Requests   Final    BOTTLES DRAWN AEROBIC AND ANAEROBIC Blood Culture results may not be optimal due to an excessive volume of blood received in culture bottles   Culture   Final    NO GROWTH 5 DAYS Performed at [facility], [place] [address], [place], [place] [address]    Report Status 02/18/2020 FINAL  Final  MRSA PCR Screening     Status: Abnormal   Collection Time: 02/14/20  3:03 AM   Specimen: Nasal Mucosa; Nasopharyngeal  Result Value Ref Range Status   MRSA by PCR POSITIVE (A) NEGATIVE Final    Comment:        The GeneXpert MRSA Assay (FDA approved for NASAL specimens only), is one component of a comprehensive MRSA colonization surveillance program. It is not intended to diagnose MRSA infection nor to guide or monitor treatment for MRSA infections. RESULT CALLED TO, READ BACK BY AND VERIFIED WITH: Lillie Fragmin RN 8:25 02/14/20 ([email or web address]) Performed at [facility], [place] [address] [place], [place] [address]   CSF culture  Status: None   Collection Time: 02/14/20 11:01 AM   Specimen: CSF; Cerebrospinal Fluid  Result Value Ref Range Status   Specimen Description CSF  Final   Special Requests NONE  Final   Gram Stain   Final    WBC PRESENT,BOTH PMN AND MONONUCLEAR NO ORGANISMS SEEN CYTOSPIN SMEAR    Culture   Final    NO GROWTH 3 DAYS Performed at [facility], [address], [place], [place] [address]    Report Status 02/17/2020 FINAL  Final  Culture, fungus without smear     Status: None (Preliminary result)   Collection Time: 02/17/20  1:14 PM   Specimen: CSF; Cerebrospinal  Fluid  Result Value Ref Range Status   Specimen Description CSF  Final   Special Requests NONE  Final   Culture   Final    NO FUNGUS ISOLATED AFTER 1 DAY Performed at [facility], [place] [address], [place],  [place] [address]    Report Status PENDING  Incomplete    Alcide Evener, [place] [facility] [phone] pager  02/20/2020, 4:51 PM

## 2020-02-21 ENCOUNTER — Encounter (HOSPITAL_COMMUNITY): Payer: Self-pay | Admitting: Internal Medicine

## 2020-02-21 DIAGNOSIS — R569 Unspecified convulsions: Secondary | ICD-10-CM | POA: Diagnosis not present

## 2020-02-21 DIAGNOSIS — G030 Nonpyogenic meningitis: Secondary | ICD-10-CM

## 2020-02-21 DIAGNOSIS — R634 Abnormal weight loss: Secondary | ICD-10-CM | POA: Diagnosis not present

## 2020-02-21 DIAGNOSIS — G0490 Encephalitis and encephalomyelitis, unspecified: Secondary | ICD-10-CM | POA: Diagnosis not present

## 2020-02-21 HISTORY — DX: Nonpyogenic meningitis: G03.0

## 2020-02-21 HISTORY — DX: Abnormal weight loss: R63.4

## 2020-02-21 LAB — CBC WITH DIFFERENTIAL/PLATELET
Abs Immature Granulocytes: 0.11 10*3/uL — ABNORMAL HIGH (ref 0.00–0.07)
Basophils Absolute: 0 10*3/uL (ref 0.0–0.1)
Basophils Relative: 0 %
Eosinophils Absolute: 0.2 10*3/uL (ref 0.0–0.5)
Eosinophils Relative: 2 %
HCT: 32.6 % — ABNORMAL LOW (ref 39.0–52.0)
Hemoglobin: 10.4 g/dL — ABNORMAL LOW (ref 13.0–17.0)
Immature Granulocytes: 2 %
Lymphocytes Relative: 12 %
Lymphs Abs: 0.9 10*3/uL (ref 0.7–4.0)
MCH: 28.3 pg (ref 26.0–34.0)
MCHC: 31.9 g/dL (ref 30.0–36.0)
MCV: 88.6 fL (ref 80.0–100.0)
Monocytes Absolute: 0.7 10*3/uL (ref 0.1–1.0)
Monocytes Relative: 10 %
Neutro Abs: 5.5 10*3/uL (ref 1.7–7.7)
Neutrophils Relative %: 74 %
Platelets: 296 10*3/uL (ref 150–400)
RBC: 3.68 MIL/uL — ABNORMAL LOW (ref 4.22–5.81)
RDW: 15.8 % — ABNORMAL HIGH (ref 11.5–15.5)
WBC: 7.4 10*3/uL (ref 4.0–10.5)
nRBC: 0 % (ref 0.0–0.2)

## 2020-02-21 LAB — BASIC METABOLIC PANEL
Anion gap: 11 (ref 5–15)
BUN: 14 mg/dL (ref 8–23)
CO2: 22 mmol/L (ref 22–32)
Calcium: 8.9 mg/dL (ref 8.9–10.3)
Chloride: 98 mmol/L (ref 98–111)
Creatinine, Ser: 1.16 mg/dL (ref 0.61–1.24)
GFR, Estimated: >60 mL/min (ref >60)
Glucose, Bld: 102 mg/dL — ABNORMAL HIGH (ref 70–99)
Potassium: 3.8 mmol/L (ref 3.5–5.1)
Sodium: 131 mmol/L — ABNORMAL LOW (ref 135–145)

## 2020-02-21 LAB — GLUCOSE, CAPILLARY
Glucose-Capillary: 78 mg/dL (ref 70–99)
Glucose-Capillary: 73 mg/dL (ref 70–99)
Glucose-Capillary: 104 mg/dL — ABNORMAL HIGH (ref 70–99)
Glucose-Capillary: 103 mg/dL — ABNORMAL HIGH (ref 70–99)
Glucose-Capillary: 108 mg/dL — ABNORMAL HIGH (ref 70–99)

## 2020-02-21 LAB — PLATELET INHIBITION P2Y12: Platelet Function  P2Y12: 133 [PRU] — ABNORMAL LOW (ref 182–335)

## 2020-02-21 LAB — PHOSPHORUS: Phosphorus: 2.8 mg/dL (ref 2.5–4.6)

## 2020-02-21 MED ORDER — RESOURCE THICKENUP CLEAR PO POWD
ORAL | Status: DC | PRN
  Filled 2020-02-21: qty 125

## 2020-02-21 NOTE — Progress Notes (Signed)
PROGRESS NOTE    Fernando Stoiber  ZMO:[id number] DOB: 03-01-1946 DOA: 02/13/2020 PCP: System, Provider Not In   Brief Narrative: The patient is a 74 year old Caucasian male with a past medical history significant for but not limited to Crohn's disease, hypertension, hyperlipidemia, depression history of hospitalization in June 2021 for pneumatosis status post ileocolonic resection and lysis of adhesions who presented with multiple tonic-clonic seizures complicated by status epilepticus that required intubation and loading with Keppra. CT of the head was negative and initially when he was brought in to the ED by EMS there was at least 3 witnessed generalized tonic-clonic seizures noted. PCCM was consulted for admission given that he was intubated on arrival and unresponsive. He was intubated on 02/13/2020 and extubated 02/15/2020. Neurology was consulted and he underwent further work-up and was found to have a signal abnormality in the left limb is most consistent with an acute infarct. Echocardiogram done showed an EF of 55 to 60% with moderate LVH and grade 1 diastolic dysfunction. EEG testing on 10 9 was negative. 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 He underwent several viral serologies and had a lumbar puncture sent on 02/14/2020. He was started on IV vancomycin, IV ceftriaxone, IV ampicillin and IV acyclovir for suspected meningitis. He was transferred to [facility] service on 02/19/2020 and remains confused. Neurology is following but did not see the patient today and PT OT recommending CIR and are prior to going back to [place] where patient's daughter lives.   Assessment & Plan:   Principal Problem:   Aseptic meningitis Active Problems:   Seizure ([place])   Weight loss  Meningitis  - LP performed on 10/7 consistent with meningitis - Cultures are NGTD at 3 Days; HSV with positive IgG...  pcr negative. - VDLR and cryptococcal antigen negative., and [place]ile serology pending - Quantiferon gold was indeterminate.  Patient was on vancomycin, ceftriaxone and acyclovir.  Consulted ID on 02/20/2020.  Dr. Tommy Medal recommended repeat large-volume LP to send more labs.  Unfortunately, patient was already on Plavix which I discontinued on 02/20/2020.  Rocephin and vancomycin discontinued but acyclovir continues.  Had a long multidisciplinary discussion between me, Dr. Tommy Medal and Dr. Hortense Ramal of neurology and plan is to send P2 Y12 to see platelet function, if able, neurology will perform LP today and then ID will initiate empiric therapy.   Acute Ischemic Infarct  -Seen on MRI imaging on 10/7: left thalamic signal abnormality measuring 2.3 cm most consistent with acute infarct; no edema or hemorrhagic conversion. 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 2 mm left paraophthalmic ICA aneurysm. Widely patent cervical carotid and vertebral arteries. Right upper lobe pulmonary nodules measuring up to 5 mm. Aortic Atherosclerosis (ICD10-I70.0) and Emphysema." -  now on atorvastatin 80 mg despite that he takes 20 at home given his acute CVA - TTE negative for vegetations or thrombosis.  - Continue high intensity statin; Lipid panel done and showed a total cholesterol/HDL ratio 3.3, cholesterol level 154, HDL level 46, LDL 79, triglycerides 146, VLDL 29 -PT OT recommending CIR next-appreciate neurology further evaluation recommendations.  We will continue aspirin but discontinued Plavix for the reasons mentioned above.  Tonic-Clonic  Seizures Status Epilepticus: Resolved - Secondary to meningitis  - Neurology on board; appreciate their recommendations - Continue Keppra IV and when can take PO cont  1000mg  BID -We will need SLP evaluation  Hypomagnesemia Resolved.  Hypophosphatemia Resolved.  B12 Deficiency  Moderate Malnutrition - Likely due to malnutrition and anorexia, possibly worsened by malabsorption given bowel inflammation seen on imaging the past several months prior to resection. Of note, 70 lb weight loss in the past 10 months.  - Consult to dietician for tube feed initiation, he was started on tube feedings which was discontinued now that he is tolerating dysphagia 2 diet. Nutritionist now recommending hormonal shake twice daily as well as Magic cup twice daily with meals and multivitamin with minerals daily.  HTN/HLD -Home medications include Toprol 100 mg, Amlodipine 5 mg, Lipitor 20 mg, Fenofibrate 160 mg.  Blood pressure elevated this morning prior to getting his medications.  No rechecks has been done.  Continue amlodipine 5 mg and Toprol-XL 100 mg daily.  Continue as needed labetalol.  Normocytic Anemia  Hx of Iron Deficiency Anemia B12 Deficiency  - B12 levels low. Iron panel shows low saturation, however normal iron and TIBC, unclear picture as not consistent completely with iron deficiency anemia or anemia of chronic disease.  Hemoglobin is stable.  ? Crohn's Disease s/p Ileocolic Resection  - No longer on immunosuppressive therapy. -Takes budesonide as an outpatient but this has not been resumed yet.  AKI on CKD stage II Back to normal.  DVT prophylaxis: SCDs Heparin 5000 units subcu every 8 hours Code Status: FULL CODE  Family Communication: No family present at bedside.  Patient's daughter getting updates from ID and neurology today. Disposition Plan: Pending further clinical improvement back to baseline and clearance by neurology prior to safe discharge disposition  Status is: Inpatient  Remains inpatient appropriate because:Altered mental status, Unsafe d/c plan, IV treatments appropriate due to intensity of illness or inability to take PO and  Inpatient level of care appropriate due to severity of illness  Dispo: The patient is from: Home              Anticipated d/c is to: CIR              Anticipated d/c date is: 2 days              Patient currently is not medically stable to d/c.  Consultants:   Neurology  PCCM Transfer   Procedures:  10/6 >> ETT placed 10/7 >> LP 10/8 >> Extubated   Significant Diagnostic Tests:  10/6 CT Head >> No acute intracranial abnormalities; age-related volume loss and chronic small vessel disease 10/7 MRI brain: 1. Signal abnormality in the left thalamus most consistent with an acute infarct. 2. Signal abnormality in the mesial left temporal lobe and splenium of the corpus callosum which may reflect acute infarct, sequelae of recent seizure activity, or encephalitis (including herpes encephalitis). No contralateral cerebral involvement. 3. Mild chronic small vessel ischemic disease. 10/8 echo: LVEF 55-60%, mod LVH, Grade I diastolic dysfunction 75/1 CT Head >> Progressive low density in the left thalamus related to acute infarction; no hemorrhage 10/9 EEG >> Negative  Micro Data:  10/6 COVID-19 + influenza >> Negative  10/6 Blood Cultures >> NGTD 10/7 CSF Culture >> NGTD 10/7 HSV serology >> 2.02 on igg testing. Will send pcr to determine past or active.  10/7 West Nile serology >>  10/7 VRDL serology >> 10/8 Quanitferon Gold  >> indeterminant 10/9 acid  fast csf:  10/11 HSV PCR: pending   Antimicrobials:  Anti-infectives (From admission, onward)   Start     Dose/Rate Route Frequency Ordered Stop   02/20/20 1000  vancomycin (VANCOCIN) IVPB 1000 mg/200 mL premix        1,000 mg 200 mL/hr over 60 Minutes Intravenous Every 24 hours 02/20/20 0555     02/19/20 1811  vancomycin variable dose per unstable renal function (pharmacist dosing)  Status:  Discontinued         Does not apply See admin instructions 02/19/20 1811 02/20/20 0555   02/18/20 0500  vancomycin (VANCOREADY) IVPB  750 mg/150 mL  Status:  Discontinued        750 mg 150 mL/hr over 60 Minutes Intravenous Every 12 hours 02/17/20 1651 02/19/20 1811   02/15/20 0300  vancomycin (VANCOREADY) IVPB 500 mg/100 mL        500 mg 100 mL/hr over 60 Minutes Intravenous Every 12 hours 02/14/20 1352 02/17/20 1755   02/14/20 1400  acyclovir (ZOVIRAX) 690 mg in dextrose 5 % 100 mL IVPB        10 mg/kg  68.9 kg 113.8 mL/hr over 60 Minutes Intravenous Every 12 hours 02/14/20 1245     02/14/20 1400  vancomycin (VANCOREADY) IVPB 1250 mg/250 mL        1,250 mg 166.7 mL/hr over 90 Minutes Intravenous STAT 02/14/20 1352 02/14/20 1655   02/14/20 1300  cefTRIAXone (ROCEPHIN) 2 g in sodium chloride 0.9 % 100 mL IVPB  Status:  Discontinued        2 g 200 mL/hr over 30 Minutes Intravenous Every 12 hours 02/14/20 1245 02/20/20 1732   02/14/20 1300  ampicillin (OMNIPEN) 2 g in sodium chloride 0.9 % 100 mL IVPB  Status:  Discontinued        2 g 300 mL/hr over 20 Minutes Intravenous Every 6 hours 02/14/20 1245 02/18/20 1051       Subjective: Patient seen and examined.  Sitting in the bed.  Much more awake and alert today.  Oriented to person.  He knew he was in the hospital but did not know the city.  Was trying to answer but it looks like he was having word finding difficulty.  Objective: Vitals:   02/20/20 1952 02/20/20 2324 02/21/20 0322 02/21/20 0803  BP: (!) 141/83 (!) 157/85 (!) 154/94 (!) 167/81  Pulse: 71 68 60 71  Resp: 18 18 18 18   Temp: 98.8 F (37.1 C) 98 F (36.7 C) 98.3 F (36.8 C) 99.3 F (37.4 C)  TempSrc: Oral Axillary Axillary Oral  SpO2: 99% 99% 98% 100%  Weight:   61.3 kg   Height:        Intake/Output Summary (Last 24 hours) at 02/21/2020 1109 Last data filed at 02/21/2020 0900 Gross per 24 hour  Intake 862 ml  Output 300 ml  Net 562 ml   Filed Weights   02/19/20 0500 02/20/20 0211 02/21/20 0322  Weight: 61.1 kg 60.8 kg 61.3 kg   Examination: Physical Exam:  General exam: Appears calm  and comfortable  Respiratory system: Clear to auscultation. Respiratory effort normal. Cardiovascular system: S1 & S2 heard, RRR. No JVD, murmurs, rubs, gallops or clicks. No pedal edema. Gastrointestinal system: Abdomen is nondistended, soft and nontender. No organomegaly or masses felt. Normal bowel sounds heard. Central nervous system: Alert and oriented x2. No focal neurological deficits. Extremities: Symmetric 5 x 5 power. Skin: No rashes, lesions or ulcers.     Data Reviewed: I have  personally reviewed following labs and imaging studies  CBC: Recent Labs  Lab 02/15/20 0322 02/15/20 0322 02/16/20 0729 02/16/20 1116 02/17/20 0329 02/18/20 0419 02/21/20 0814  WBC 6.2  --  9.4  --  7.6 7.9 7.4  NEUTROABS  --   --   --   --   --   --  5.5  HGB 8.2*   < > 8.5* 8.5* 8.9* 9.5* 10.4*  HCT 24.4*   < > 26.7* 25.0* 28.3* 29.4* 32.6*  MCV 85.9  --  88.7  --  88.7 87.0 88.6  PLT 256  --  289  --  303 217 296   < > = values in this interval not displayed.   Basic Metabolic Panel: Recent Labs  Lab 02/16/20 0729 02/16/20 1116 02/17/20 0329 02/17/20 0329 02/18/20 0419 02/19/20 0641 02/19/20 1813 02/20/20 0449 02/21/20 0432  NA 140   < > 141  --  137 135  --  134* 131*  K 3.8   < > 3.8  --  4.2 3.8  --  3.4* 3.8  CL 105   < > 107  --  103 96*  --  99 98  CO2 24   < > 24  --  19* 24  --  24 22  GLUCOSE 128*   < > 106*  --  108* 93  --  97 102*  BUN 14   < > 18  --  20 18  --  15 14  CREATININE 1.23   < > 1.18   < > 1.18 1.27* 1.29* 1.20 1.16  CALCIUM 8.7*   < > 9.1  --  9.0 9.4  --  8.8* 8.9  MG 1.7  --  1.9  --  2.2 1.6*  --  2.0  --   PHOS 2.8   < > 2.8  --  2.7 1.8*  --  3.0 2.8   < > = values in this interval not displayed.   GFR: Estimated Creatinine Clearance: 49.9 mL/min (by C-G formula based on SCr of 1.16 mg/dL). Liver Function Tests: Recent Labs  Lab 02/15/20 0322  AST 20  ALT 14  ALKPHOS 21*  BILITOT 0.5  PROT 5.1*  ALBUMIN 2.5*   No results for  input(s): LIPASE, AMYLASE in the last 168 hours. No results for input(s): AMMONIA in the last 168 hours. Coagulation Profile: No results for input(s): INR, PROTIME in the last 168 hours. Cardiac Enzymes: No results for input(s): CKTOTAL, CKMB, CKMBINDEX, TROPONINI in the last 168 hours. BNP (last 3 results) No results for input(s): PROBNP in the last 8760 hours. HbA1C: No results for input(s): HGBA1C in the last 72 hours. CBG: Recent Labs  Lab 02/20/20 1636 02/20/20 1951 02/20/20 2321 02/21/20 0321 02/21/20 0749  GLUCAP 113* 107* 100* 78 73   Lipid Profile: Recent Labs    02/19/20 0641  CHOL 182  HDL 55  LDLCALC 82  TRIG 227  CHOLHDL 3.3   Thyroid Function Tests: No results for input(s): TSH, T4TOTAL, FREET4, T3FREE, THYROIDAB in the last 72 hours. Anemia Panel: No results for input(s): VITAMINB12, FOLATE, FERRITIN, TIBC, IRON, RETICCTPCT in the last 72 hours. Sepsis Labs: No results for input(s): PROCALCITON, LATICACIDVEN in the last 168 hours.  Recent Results (from the past 240 hour(s))  Respiratory Panel by RT PCR (Flu A&B, Covid) - Nasopharyngeal Swab     Status: None   Collection Time: 02/13/20  4:17 PM   Specimen: Nasopharyngeal Swab  Result Value  Ref Range Status   SARS Coronavirus 2 by RT PCR NEGATIVE NEGATIVE Final    Comment: (NOTE) SARS-CoV-2 target nucleic acids are NOT DETECTED.  The SARS-CoV-2 RNA is generally detectable in upper respiratoy specimens during the acute phase of infection. The lowest concentration of SARS-CoV-2 viral copies this assay can detect is 131 copies/mL. A negative result does not preclude SARS-Cov-2 infection and should not be used as the sole basis for treatment or other patient management decisions. A negative result may occur with  improper specimen collection/handling, submission of specimen other than nasopharyngeal swab, presence of viral mutation(s) within the areas targeted by this assay, and inadequate number of  viral copies (<131 copies/mL). A negative result must be combined with clinical observations, patient history, and epidemiological information. The expected result is Negative.  Fact Sheet for Patients:  PinkCheek.be  Fact Sheet for Healthcare Providers:  GravelBags.it  This test is no t yet approved or cleared by the [place] FDA and  has been authorized for detection and/or diagnosis of SARS-CoV-2 by FDA under an Emergency Use Authorization (EUA). This EUA will remain  in effect (meaning this test can be used) for the duration of the COVID-19 declaration under Section 564(b)(1) of the Act, 21 U.S.C. section 360bbb-3(b)(1), unless the authorization is terminated or revoked sooner.     Influenza A by PCR NEGATIVE NEGATIVE Final   Influenza B by PCR NEGATIVE NEGATIVE Final    Comment: (NOTE) The Xpert Xpress SARS-CoV-2/FLU/RSV assay is intended as an aid in  the diagnosis of influenza from Nasopharyngeal swab specimens and  should not be used as a sole basis for treatment. Nasal washings and  aspirates are unacceptable for Xpert Xpress SARS-CoV-2/FLU/RSV  testing.  Fact Sheet for Patients: PinkCheek.be  Fact Sheet for Healthcare Providers: GravelBags.it  This test is not yet approved or cleared by the [place] FDA and  has been authorized for detection and/or diagnosis of SARS-CoV-2 by  FDA under an Emergency Use Authorization (EUA). This EUA will remain  in effect (meaning this test can be used) for the duration of the  Covid-19 declaration under Section 564(b)(1) of the Act, 21  U.S.C. section 360bbb-3(b)(1), unless the authorization is  terminated or revoked. Performed at [facility], [place] [address], [place], [place] [address]   Culture, blood (Routine X 2) w Reflex to ID Panel     Status: None   Collection Time: 02/13/20  4:57 PM    Specimen: BLOOD LEFT FOREARM  Result Value Ref Range Status   Specimen Description BLOOD LEFT FOREARM  Final   Special Requests   Final    BOTTLES DRAWN AEROBIC AND ANAEROBIC Blood Culture results may not be optimal due to an excessive volume of blood received in culture bottles   Culture   Final    NO GROWTH 5 DAYS Performed at [facility], [place] [address] [place], [place] [address]    Report Status 02/18/2020 FINAL  Final  Culture, blood (Routine X 2) w Reflex to ID Panel     Status: None   Collection Time: 02/13/20  5:02 PM   Specimen: BLOOD LEFT WRIST  Result Value Ref Range Status   Specimen Description BLOOD LEFT WRIST  Final   Special Requests   Final    BOTTLES DRAWN AEROBIC AND ANAEROBIC Blood Culture results may not be optimal due to an excessive volume of blood received in culture bottles   Culture   Final    NO GROWTH  5 DAYS Performed at [facility], [place] [address], [place], [place] [address]    Report Status 02/18/2020 FINAL  Final  MRSA PCR Screening     Status: Abnormal   Collection Time: 02/14/20  3:03 AM   Specimen: Nasal Mucosa; Nasopharyngeal  Result Value Ref Range Status   MRSA by PCR POSITIVE (A) NEGATIVE Final    Comment:        The GeneXpert MRSA Assay (FDA approved for NASAL specimens only), is one component of a comprehensive MRSA colonization surveillance program. It is not intended to diagnose MRSA infection nor to guide or monitor treatment for MRSA infections. RESULT CALLED TO, READ BACK BY AND VERIFIED WITH: Lillie Fragmin RN 8:25 02/14/20 ([email or web address]) Performed at [facility], [place] [address], [place], [place] [address]   CSF culture     Status: None   Collection Time: 02/14/20 11:01 AM   Specimen: CSF; Cerebrospinal Fluid  Result Value Ref Range Status   Specimen Description CSF  Final   Special Requests NONE  Final   Gram Stain   Final    WBC PRESENT,BOTH PMN AND MONONUCLEAR NO ORGANISMS SEEN CYTOSPIN SMEAR    Culture    Final    NO GROWTH 3 DAYS Performed at [facility], [place] [address], [place], [place] [address]    Report Status 02/17/2020 FINAL  Final  Acid Fast Smear (AFB)     Status: None   Collection Time: 02/17/20  1:11 PM   Specimen: PATH Cytology CSF  Result Value Ref Range Status   AFB Specimen Processing Concentration  Final   Acid Fast Smear Negative  Final    Comment: (NOTE) Performed At: [facility] [place], [place] [id number] Rush Farmer MD [id number]    Source (AFB) CSF  Final    Comment: Performed at [facility], [place] [address] [place], [place] [address]  Culture, fungus without smear     Status: None (Preliminary result)   Collection Time: 02/17/20  1:14 PM   Specimen: CSF; Cerebrospinal Fluid  Result Value Ref Range Status   Specimen Description CSF  Final   Special Requests NONE  Final   Culture   Final    NO FUNGUS ISOLATED AFTER 2 DAYS Performed at [facility], [place] [address] [place], [place] [address]    Report Status PENDING  Incomplete     RN Pressure Injury Documentation:     Estimated body mass index is 19.39 kg/m as calculated from the following:   Height as of this encounter: 5\' 10"  (1.778 m).   Weight as of this encounter: 61.3 kg.  Malnutrition Type:  Nutrition Problem: Moderate Malnutrition   Malnutrition Characteristics:  Signs/Symptoms: mild fat depletion, moderate fat depletion, mild muscle depletion, moderate muscle depletion   Nutrition Interventions:  Interventions: Hormel Shake, Magic cup, MVI   Radiology Studies: CT CHEST W CONTRAST  Result Date: 02/20/2020 CLINICAL DATA:  Sepsis EXAM: CT CHEST WITH CONTRAST TECHNIQUE: Multidetector CT imaging of the chest was performed during intravenous contrast administration. CONTRAST:  46mL OMNIPAQUE IOHEXOL 300 MG/ML  SOLN COMPARISON:  Chest radiograph 02/18/2020 FINDINGS: Cardiovascular: Normal heart size. Trace pericardial fluid with some mild  pericardial hazy stranding anteriorly towards the apex (3/113, 97) coronary artery calcifications are present. Central pulmonary arteries are normal caliber. The aortic root is suboptimally assessed given cardiac pulsation artifact. Atherosclerotic plaque within the normal caliber aorta. No acute luminal abnormality of the imaged aorta.  No periaortic stranding or hemorrhage. Shared origin of the brachiocephalic and left common carotid arteries. Minimal plaque in the proximal great vessels which are otherwise unremarkable. Central pulmonary arteries are normal caliber. No large central or lobar filling defects on this non tailored examination of the pulmonary arteries. No major venous abnormality. Mediastinum/Nodes: No mediastinal fluid or gas. Normal thyroid gland and thoracic inlet. No acute abnormality of the esophagus. Secretions seen in the trachea and central airways. No worrisome mediastinal, hilar or axillary adenopathy. Lungs/Pleura: Centrilobular emphysema changes present throughout the lungs. Scattered calcified granulomata bilaterally. Some more clustered branching nodules seen in the periphery of the anterior segment right upper lobe may be infectious or inflammatory or related to mucus impacted airways given more diffuse airways thickening and scattered secretions elsewhere. Trace left effusion. Some adjacent passive atelectasis with more dependent atelectatic changes both lung bases posteriorly. Some bandlike opacities likely reflect atelectasis or scarring. Upper Abdomen: Gallbladder is decompressed. Some irregular thickening of the gallbladder wall with possible layering calcified biliary sand or small gallstones. Question some faint pericholecystic inflammation. Upper abdomen is otherwise unremarkable. Musculoskeletal: No chest wall abnormality. No acute or significant osseous findings. IMPRESSION: 1. Trace pericardial fluid with some mild pericardial hazy stranding anteriorly towards the apex,  nonspecific but can be seen with pericarditis. 2. Scattered airways thickening and secretions with some more branching opacities in the periphery of the anterior segment right upper lobe, could reflect some mucous impaction or mild infection or inflammatory change. 3. Trace left effusion with adjacent passive atelectasis. 4. Gallbladder is decompressed with irregular thickening of the gallbladder wall with possible layering calcified biliary sand or small gallstones. Question some faint pericholecystic inflammation. If there is concern for acute cholecystitis, recommend further evaluation with right upper quadrant ultrasound. 5. Evidence of prior granulomatous disease within the chest with scattered calcified granulomata throughout the lungs. 6. Aortic Atherosclerosis (ICD10-I70.0) 7. Emphysema (ICD10-J43.9). Electronically Signed   By: Lovena Le M.D.   On: 02/20/2020 22:10   Scheduled Meds: . amLODipine  5 mg Oral Daily  . aspirin  81 mg Oral Daily  . atorvastatin  80 mg Oral Daily  . Chlorhexidine Gluconate Cloth  6 each Topical Daily  . docusate  100 mg Oral BID  . fenofibrate  160 mg Oral Daily  . heparin  5,000 Units Subcutaneous Q8H  . insulin aspart  0-9 Units Subcutaneous Q4H  . metoprolol succinate  100 mg Oral Daily  . multivitamin with minerals  1 tablet Oral Daily  . pantoprazole (PROTONIX) IV  40 mg Intravenous Daily  . polyethylene glycol  17 g Oral Daily  . potassium chloride  40 mEq Oral Daily  . vitamin B-12  1,000 mcg Oral Daily   Continuous Infusions: . sodium chloride Stopped (02/18/20 1022)  . acyclovir 690 mg (02/20/20 2150)  . levETIRAcetam 1,000 mg (02/21/20 0414)  . vancomycin 1,000 mg (02/20/20 1244)    LOS: 8 days   Darliss Cheney, MD [facility] PAGER is on [place]  If 7PM-7AM, please contact night-coverage [email or web address]

## 2020-02-21 NOTE — Progress Notes (Signed)
      INFECTIOUS DISEASE ATTENDING ADDENDUM:   Date: 02/21/2020  Patient name: Howard Allen  Medical record number: [id number]  Date of birth: January 09, 1946   Discussed case with Dr Megan Salon and Dr Baxter Flattery who both favor WAITING until LP can be done rather than initiating empiric therapy  Hopefully it can be done in next few days and in interim will monitor him closely  Will see again today   Alcide Evener 02/21/2020, 8:48 AM

## 2020-02-21 NOTE — Progress Notes (Signed)
Physical Therapy Treatment Patient Details Name: Howard Allen MRN: [id number] DOB: [birth year] Today's Date: 02/21/2020    History of Present Illness Pt is a 74 year old male who was having multiple seizures and has a history of falls. 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 NIH 4 on 02/15/20 and then 14 on 02/17/20. Medical hx consisting of Chron's disease and HTN.    PT Comments    Patient received in bed, confused and willing to work with therapies. Still needs generally ModA for all aspects of functional mobility, but at least for bed mobility/transfers is progressing to +1 (would still need +2 for gait). Stood at EOB and immediately began having uncontrolled liquid BMs. Performed repeated sit to stands at [facility] for pericare with patient progressively becoming more fatigued. NT arrived to help clean up and patient was returned to bed with all needs met, NT present and attending. Per chart review, CIR has signed off- PT recommendations updated as appropriate.     Follow Up Recommendations  Supervision/Assistance - 24 hour;SNF     Equipment Recommendations  Rolling walker with 5" wheels;3in1 (PT);Wheelchair (measurements PT);Wheelchair cushion (measurements PT);Hospital bed    Recommendations for Other Services       Precautions / Restrictions Precautions Precautions: Fall;Other (comment) Precaution Comments: seizures, contact precautions, condom cath, posey wrist restraints Restrictions Weight Bearing Restrictions: No    Mobility  Bed Mobility Overal bed mobility: Needs Assistance Bed Mobility: Supine to  Sit;Sit to Supine     Supine to sit: Mod assist Sit to supine: Mod assist   General bed mobility comments: ModA for scooting legs around and bringing trunk up to midilne; able to maintain midline sitting with min guard  Transfers Overall transfer level: Needs assistance Equipment used: Rolling walker (2 wheeled) Transfers: Sit to/from Stand Sit to Stand: Min guard;Mod assist;+2 physical assistance         General transfer comment: repeated sit to stands beside bed with repeated and multimodal cues for hand placement and safety; able to stand with ModAx1 and min guard of second person today.  Ambulation/Gait         Gait velocity: decreased   General Gait Details: deferred- limited by continuous incontinent liquid BMs and fatigue   Stairs             Wheelchair Mobility    Modified Rankin (Stroke Patients Only)       Balance Overall balance assessment: History of Falls;Needs assistance Sitting-balance support: Single extremity supported;Feet supported Sitting balance-Leahy Scale: Fair Sitting balance - Comments: min guard for safety but able to maintain midline   Standing balance support: Bilateral upper extremity supported Standing balance-Leahy Scale: Poor Standing balance comment: still with posterior lean but will correct with cues, Min-modAx1 to maintain static standing balance                            Cognition Arousal/Alertness: Awake/alert Behavior During Therapy: Flat affect Overall Cognitive Status: Impaired/Different from baseline Area of Impairment: Orientation;Attention;Memory;Following commands;Safety/judgement;Awareness;Problem solving                 Orientation  Level: Disoriented to;Place;Time;Situation Current Attention Level: Sustained Memory: Decreased recall of precautions;Decreased short-term memory Following Commands: Follows one step commands inconsistently;Follows one step commands with increased  time Safety/Judgement: Decreased awareness of safety;Decreased awareness of deficits Awareness: Intellectual Problem Solving: Slow processing;Decreased initiation;Difficulty sequencing;Requires verbal cues;Requires tactile cues General Comments: needed less repetition today, but still needs redirecting and without awarness of uncontrolled bowel movements; mildly impulsive. Needed lots of processing time today.      Exercises      General Comments General comments (skin integrity, edema, etc.): uncontrolled, continuous BM upon standing so still could not gait train      Pertinent Vitals/Pain Pain Assessment: Faces Faces Pain Scale: No hurt Pain Intervention(s): Limited activity within patient's tolerance;Monitored during session;Repositioned    Home Living                      Prior Function            PT Goals (current goals can now be found in the care plan section) Acute Rehab PT Goals Patient Stated Goal: to go home PT Goal Formulation: With patient Time For Goal Achievement: 03/04/20 Potential to Achieve Goals: Good Progress towards PT goals: Progressing toward goals    Frequency    Min 3X/week      PT Plan Current plan remains appropriate    Co-evaluation              AM-PAC PT "6 Clicks" Mobility   Outcome Measure  Help needed turning from your back to your side while in a flat bed without using bedrails?: A Little Help needed moving from lying on your back to sitting on the side of a flat bed without using bedrails?: A Lot Help needed moving to and from a bed to a chair (including a wheelchair)?: A Lot Help needed standing up from a chair using your arms (e.g., wheelchair or bedside chair)?: A Lot Help needed to walk in hospital room?: A Lot Help needed climbing 3-5 steps with a railing? : A Lot 6 Click Score: 13    End of Session   Activity Tolerance: Patient limited by fatigue;Patient tolerated treatment well Patient left: in bed;with  call bell/phone within reach;with nursing/sitter in room (NT present and attending) Nurse Communication: Mobility status PT Visit Diagnosis: Unsteadiness on feet (R26.81);Other abnormalities of gait and mobility (R26.89);Muscle weakness (generalized) (M62.81);History of falling (Z91.81);Difficulty in walking, not elsewhere classified (R26.2);Other symptoms and signs involving the nervous system (R29.898)     Time: 5631-4970 PT Time Calculation (min) (ACUTE ONLY): 26 min  Charges:  $Therapeutic Activity: 23-37 mins                     Windell Norfolk, DPT, PN1   Supplemental Physical Therapist [place]    Pager [phone] Acute Rehab Office [phone]

## 2020-02-21 NOTE — Care Management Important Message (Signed)
Important Message  Patient Details  Name: Howard Allen MRN: [id number] Date of Birth: 03/20/1946   Medicare Important Message Given:  No  Precaution in place IM not given. Will mail to home address.    [name] 02/21/2020, 2:24 PM

## 2020-02-21 NOTE — Progress Notes (Signed)
Subjective: No new complaints   Antibiotics:  Anti-infectives (From admission, onward)   Start     Dose/Rate Route Frequency Ordered Stop   02/20/20 1000  vancomycin (VANCOCIN) IVPB 1000 mg/200 mL premix        1,000 mg 200 mL/hr over 60 Minutes Intravenous Every 24 hours 02/20/20 0555     02/19/20 1811  vancomycin variable dose per unstable renal function (pharmacist dosing)  Status:  Discontinued         Does not apply See admin instructions 02/19/20 1811 02/20/20 0555   02/18/20 0500  vancomycin (VANCOREADY) IVPB 750 mg/150 mL  Status:  Discontinued        750 mg 150 mL/hr over 60 Minutes Intravenous Every 12 hours 02/17/20 1651 02/19/20 1811   02/15/20 0300  vancomycin (VANCOREADY) IVPB 500 mg/100 mL        500 mg 100 mL/hr over 60 Minutes Intravenous Every 12 hours 02/14/20 1352 02/17/20 1755   02/14/20 1400  acyclovir (ZOVIRAX) 690 mg in dextrose 5 % 100 mL IVPB        10 mg/kg  68.9 kg 113.8 mL/hr over 60 Minutes Intravenous Every 12 hours 02/14/20 1245     02/14/20 1400  vancomycin (VANCOREADY) IVPB 1250 mg/250 mL        1,250 mg 166.7 mL/hr over 90 Minutes Intravenous STAT 02/14/20 1352 02/14/20 1655   02/14/20 1300  cefTRIAXone (ROCEPHIN) 2 g in sodium chloride 0.9 % 100 mL IVPB  Status:  Discontinued        2 g 200 mL/hr over 30 Minutes Intravenous Every 12 hours 02/14/20 1245 02/20/20 1732   02/14/20 1300  ampicillin (OMNIPEN) 2 g in sodium chloride 0.9 % 100 mL IVPB  Status:  Discontinued        2 g 300 mL/hr over 20 Minutes Intravenous Every 6 hours 02/14/20 1245 02/18/20 1051      Medications: Scheduled Meds: . amLODipine  5 mg Oral Daily  . aspirin  81 mg Oral Daily  . atorvastatin  80 mg Oral Daily  . Chlorhexidine Gluconate Cloth  6 each Topical Daily  . docusate  100 mg Oral BID  . fenofibrate  160 mg Oral Daily  . heparin  5,000 Units Subcutaneous Q8H  . insulin aspart  0-9 Units Subcutaneous Q4H  . metoprolol succinate  100 mg Oral Daily    . multivitamin with minerals  1 tablet Oral Daily  . pantoprazole (PROTONIX) IV  40 mg Intravenous Daily  . polyethylene glycol  17 g Oral Daily  . potassium chloride  40 mEq Oral Daily  . vitamin B-12  1,000 mcg Oral Daily   Continuous Infusions: . sodium chloride Stopped (02/18/20 1022)  . acyclovir 690 mg (02/20/20 2150)  . levETIRAcetam 1,000 mg (02/21/20 0414)  . vancomycin 1,000 mg (02/20/20 1244)   PRN Meds:.albuterol, docusate, labetalol    Objective: Weight change: 0.5 kg  Intake/Output Summary (Last 24 hours) at 02/21/2020 1015 Last data filed at 02/21/2020 0900 Gross per 24 hour  Intake 862 ml  Output 300 ml  Net 562 ml   Blood pressure (!) 167/81, pulse 71, temperature 99.3 F (37.4 C), temperature source Oral, resp. rate 18, height 5\' 10"  (1.778 m), weight 61.3 kg, SpO2 100 %. Temp:  [98 F (36.7 C)-99.3 F (37.4 C)] 99.3 F (37.4 C) (10/14 0803) Pulse Rate:  [60-77] 71 (10/14 0803) Resp:  [18] 18 (10/14 0803) BP: (138-167)/(77-94) 167/81 (10/14 0803) SpO2:  [  98 %-100 %] 100 % (10/14 0803) Weight:  [61.3 kg] 61.3 kg (10/14 0322)  Physical Exam: General: Alert and awake,oriented to person, but thinks he is in [place] He remembers being diagnosed with Crohn's disease.  He also recalls his wife's death and that she suffered from Parkinson's disease.   HEENT: anicteric sclera, EOMI, laceration on face CVS regular rate, normal  Chest: , no wheezing, no respiratory distress Abdomen: soft non-distended,  Extremities: no edema or deformity noted bilaterally Skin: no rashes Neuro: nonfocal  CBC:    BMET Recent Labs    02/20/20 0449 02/21/20 0432  NA 134* 131*  K 3.4* 3.8  CL 99 98  CO2 24 22  GLUCOSE 97 102*  BUN 15 14  CREATININE 1.20 1.16  CALCIUM 8.8* 8.9     Liver Panel  No results for input(s): PROT, ALBUMIN, AST, ALT, ALKPHOS, BILITOT, BILIDIR, IBILI in the last 72 hours.     Sedimentation Rate No results for input(s): ESRSEDRATE  in the last 72 hours. C-Reactive Protein No results for input(s): CRP in the last 72 hours.  Micro Results: Recent Results (from the past 720 hour(s))  Respiratory Panel by RT PCR (Flu A&B, Covid) - Nasopharyngeal Swab     Status: None   Collection Time: 02/13/20  4:17 PM   Specimen: Nasopharyngeal Swab  Result Value Ref Range Status   SARS Coronavirus 2 by RT PCR NEGATIVE NEGATIVE Final    Comment: (NOTE) SARS-CoV-2 target nucleic acids are NOT DETECTED.  The SARS-CoV-2 RNA is generally detectable in upper respiratoy specimens during the acute phase of infection. The lowest concentration of SARS-CoV-2 viral copies this assay can detect is 131 copies/mL. A negative result does not preclude SARS-Cov-2 infection and should not be used as the sole basis for treatment or other patient management decisions. A negative result may occur with  improper specimen collection/handling, submission of specimen other than nasopharyngeal swab, presence of viral mutation(s) within the areas targeted by this assay, and inadequate number of viral copies (<131 copies/mL). A negative result must be combined with clinical observations, patient history, and epidemiological information. The expected result is Negative.  Fact Sheet for Patients:  PinkCheek.be  Fact Sheet for Healthcare Providers:  GravelBags.it  This test is no t yet approved or cleared by the [place] FDA and  has been authorized for detection and/or diagnosis of SARS-CoV-2 by FDA under an Emergency Use Authorization (EUA). This EUA will remain  in effect (meaning this test can be used) for the duration of the COVID-19 declaration under Section 564(b)(1) of the Act, 21 U.S.C. section 360bbb-3(b)(1), unless the authorization is terminated or revoked sooner.     Influenza A by PCR NEGATIVE NEGATIVE Final   Influenza B by PCR NEGATIVE NEGATIVE Final    Comment:  (NOTE) The Xpert Xpress SARS-CoV-2/FLU/RSV assay is intended as an aid in  the diagnosis of influenza from Nasopharyngeal swab specimens and  should not be used as a sole basis for treatment. Nasal washings and  aspirates are unacceptable for Xpert Xpress SARS-CoV-2/FLU/RSV  testing.  Fact Sheet for Patients: PinkCheek.be  Fact Sheet for Healthcare Providers: GravelBags.it  This test is not yet approved or cleared by the [place] FDA and  has been authorized for detection and/or diagnosis of SARS-CoV-2 by  FDA under an Emergency Use Authorization (EUA). This EUA will remain  in effect (meaning this test can be used) for the duration of the  Covid-19 declaration under Section 564(b)(1) of the Act,  21  U.S.C. section 360bbb-3(b)(1), unless the authorization is  terminated or revoked. Performed at [facility], [place] [address] [place], [place] [address]   Culture, blood (Routine X 2) w Reflex to ID Panel     Status: None   Collection Time: 02/13/20  4:57 PM   Specimen: BLOOD LEFT FOREARM  Result Value Ref Range Status   Specimen Description BLOOD LEFT FOREARM  Final   Special Requests   Final    BOTTLES DRAWN AEROBIC AND ANAEROBIC Blood Culture results may not be optimal due to an excessive volume of blood received in culture bottles   Culture   Final    NO GROWTH 5 DAYS Performed at [facility], [place] [address], [place], [place] [address]    Report Status 02/18/2020 FINAL  Final  Culture, blood (Routine X 2) w Reflex to ID Panel     Status: None   Collection Time: 02/13/20  5:02 PM   Specimen: BLOOD LEFT WRIST  Result Value Ref Range Status   Specimen Description BLOOD LEFT WRIST  Final   Special Requests   Final    BOTTLES DRAWN AEROBIC AND ANAEROBIC Blood Culture results may not be optimal due to an excessive volume of blood received in culture bottles   Culture   Final    NO GROWTH 5 DAYS Performed  at [facility], [place] [address] [place], [place] [address]    Report Status 02/18/2020 FINAL  Final  MRSA PCR Screening     Status: Abnormal   Collection Time: 02/14/20  3:03 AM   Specimen: Nasal Mucosa; Nasopharyngeal  Result Value Ref Range Status   MRSA by PCR POSITIVE (A) NEGATIVE Final    Comment:        The GeneXpert MRSA Assay (FDA approved for NASAL specimens only), is one component of a comprehensive MRSA colonization surveillance program. It is not intended to diagnose MRSA infection nor to guide or monitor treatment for MRSA infections. RESULT CALLED TO, READ BACK BY AND VERIFIED WITH: Lillie Fragmin RN 8:25 02/14/20 ([email or web address]) Performed at [facility], [place] [address] [place], [place] [address]   CSF culture     Status: None   Collection Time: 02/14/20 11:01 AM   Specimen: CSF; Cerebrospinal Fluid  Result Value Ref Range Status   Specimen Description CSF  Final   Special Requests NONE  Final   Gram Stain   Final    WBC PRESENT,BOTH PMN AND MONONUCLEAR NO ORGANISMS SEEN CYTOSPIN SMEAR    Culture   Final    NO GROWTH 3 DAYS Performed at [facility], [place] [address], [place], [place] [address]    Report Status 02/17/2020 FINAL  Final  Acid Fast Smear (AFB)     Status: None   Collection Time: 02/17/20  1:11 PM   Specimen: PATH Cytology CSF  Result Value Ref Range Status   AFB Specimen Processing Concentration  Final   Acid Fast Smear Negative  Final    Comment: (NOTE) Performed At: [facility] [place], [place] [id number] Rush Farmer MD [id number]    Source (AFB) CSF  Final    Comment: Performed at [facility], [place] [address] [place], [place] [address]  Culture, fungus without smear     Status: None (Preliminary result)   Collection Time: 02/17/20  1:14 PM   Specimen: CSF; Cerebrospinal Fluid  Result Value Ref Range Status   Specimen Description CSF  Final   Special Requests NONE  Final   Culture    Final    NO FUNGUS ISOLATED AFTER 1 DAY Performed at [facility], [place] [address], [place], [place] [address]    Report Status PENDING  Incomplete    Studies/Results: CT CHEST W CONTRAST  Result Date: 02/20/2020 CLINICAL DATA:  Sepsis EXAM: CT CHEST WITH CONTRAST TECHNIQUE: Multidetector CT imaging of the chest was performed during intravenous contrast administration. CONTRAST:  82mL OMNIPAQUE IOHEXOL 300 MG/ML  SOLN COMPARISON:  Chest radiograph 02/18/2020 FINDINGS: Cardiovascular: Normal heart size. Trace pericardial fluid with some mild pericardial hazy stranding anteriorly towards the apex (3/113, 97) coronary artery calcifications are present. Central pulmonary arteries are normal caliber. The aortic root is suboptimally assessed given cardiac pulsation artifact. Atherosclerotic plaque within the normal caliber aorta. No acute luminal abnormality of the imaged aorta. No periaortic stranding or hemorrhage. Shared origin of the brachiocephalic and left common carotid arteries. Minimal plaque in the proximal great vessels which are otherwise unremarkable. Central pulmonary arteries are normal caliber. No large central or lobar filling defects on this non tailored examination of the pulmonary arteries. No major venous abnormality. Mediastinum/Nodes: No mediastinal fluid or gas. Normal thyroid gland and thoracic inlet. No acute abnormality of the esophagus. Secretions seen in the trachea and central airways. No worrisome mediastinal, hilar or axillary adenopathy. Lungs/Pleura: Centrilobular emphysema changes present throughout the lungs. Scattered calcified granulomata bilaterally. Some more clustered branching nodules seen in the periphery of the anterior segment right upper lobe may be infectious or inflammatory or related to mucus impacted airways given more diffuse airways thickening and scattered secretions elsewhere. Trace left effusion. Some adjacent passive atelectasis with more dependent  atelectatic changes both lung bases posteriorly. Some bandlike opacities likely reflect atelectasis or scarring. Upper Abdomen: Gallbladder is decompressed. Some irregular thickening of the gallbladder wall with possible layering calcified biliary sand or small gallstones. Question some faint pericholecystic inflammation. Upper abdomen is otherwise unremarkable. Musculoskeletal: No chest wall abnormality. No acute or significant osseous findings. IMPRESSION: 1. Trace pericardial fluid with some mild pericardial hazy stranding anteriorly towards the apex, nonspecific but can be seen with pericarditis. 2. Scattered airways thickening and secretions with some more branching opacities in the periphery of the anterior segment right upper lobe, could reflect some mucous impaction or mild infection or inflammatory change. 3. Trace left effusion with adjacent passive atelectasis. 4. Gallbladder is decompressed with irregular thickening of the gallbladder wall with possible layering calcified biliary sand or small gallstones. Question some faint pericholecystic inflammation. If there is concern for acute cholecystitis, recommend further evaluation with right upper quadrant ultrasound. 5. Evidence of prior granulomatous disease within the chest with scattered calcified granulomata throughout the lungs. 6. Aortic Atherosclerosis (ICD10-I70.0) 7. Emphysema (ICD10-J43.9). Electronically Signed   By: Lovena Le M.D.   On: 02/20/2020 22:10      Assessment/Plan:  INTERVAL HISTORY: case further discussed with Dr. Hortense Ramal, Doristine Bosworth and Dr. Leonie Man   Active Problems:   Seizure [facility])    Howard Allen is a 74 y.o. male with  Hx of weight loss, treatment for Crohn's disease admitted with seizures and aseptic meninigoencephalitis  #1 Aseptic meningoencephalitis  p2y12 being sent to see if Plavix having effect on his platelets and if not whether LP can be done today versus having to wait  When can get LP would get  large volume and fill at least all 4 tubes with CSF  (not possilbe 2 LP's will be done  Would then send CSF for CSF cell count, diff CSF glucose, protein CSF HSV PCR  Take dedicated 8-10 ml of CSF centifuge it and send the sediment for AFB culture, save the supernatant  Take a dedicated 8 to 10 cc of CSF centrifuge it, send sediment for fungal culture and save supernatant  Send requisite amount of CSF to pathology for flow cytometry  Send CSF for Coccidioides antibiotic bodies, CSF Blastomyces antigen in CSF histoplasma antigen, MTB PCR  CSF for paraneoplastic panel per Neurology  [place] vancomycin  Continuing acyclovir for now     LOS: 8 days   [place] 02/21/2020, 10:15 AM

## 2020-02-21 NOTE — Progress Notes (Addendum)
Subjective: No clinical seizures overnight.   ROS: negative except above  Examination  Vital signs in last 24 hours: Temp:  [98 F (36.7 C)-99.3 F (37.4 C)] 99.3 F (37.4 C) (10/14 0803) Pulse Rate:  [60-77] 71 (10/14 0803) Resp:  [18] 18 (10/14 0803) BP: (138-167)/(77-94) 167/81 (10/14 0803) SpO2:  [98 %-100 %] 100 % (10/14 0803) Weight:  [61.3 kg] 61.3 kg (10/14 0322)  General: lying in bed, not in apparent distress CVS: pulse-normal rate and rhythm RS: breathing comfortably, coarse bilaterally Extremities: normal, warm Neuro: Awake, alert oriented to self, not to place (thinks he is in [place] which is where he is originally from), follows simple one-step commands, was able to name 5 objects however intermittently did perseverate Spontaneously moving all 4 extremities in bed, difficult to assess strength as patient has trouble following complicated commands.  Basic Metabolic Panel: Recent Labs  Lab 02/16/20 0729 02/16/20 1116 02/17/20 0329 02/17/20 0329 02/18/20 0419 02/18/20 0419 02/19/20 0641 02/19/20 1813 02/20/20 0449 02/21/20 0432  NA 140   < > 141  --  137  --  135  --  134* 131*  K 3.8   < > 3.8  --  4.2  --  3.8  --  3.4* 3.8  CL 105   < > 107  --  103  --  96*  --  99 98  CO2 24   < > 24  --  19*  --  24  --  24 22  GLUCOSE 128*   < > 106*  --  108*  --  93  --  97 102*  BUN 14   < > 18  --  20  --  18  --  15 14  CREATININE 1.23   < > 1.18   < > 1.18  --  1.27* 1.29* 1.20 1.16  CALCIUM 8.7*   < > 9.1   < > 9.0   < > 9.4  --  8.8* 8.9  MG 1.7  --  1.9  --  2.2  --  1.6*  --  2.0  --   PHOS 2.8   < > 2.8  --  2.7  --  1.8*  --  3.0 2.8   < > = values in this interval not displayed.    CBC: Recent Labs  Lab 02/15/20 0322 02/15/20 0322 02/16/20 0729 02/16/20 1116 02/17/20 0329 02/18/20 0419 02/21/20 0814  WBC 6.2  --  9.4  --  7.6 7.9 7.4  NEUTROABS  --   --   --   --   --   --  5.5  HGB 8.2*   < > 8.5* 8.5* 8.9* 9.5* 10.4*  HCT 24.4*   < >  26.7* 25.0* 28.3* 29.4* 32.6*  MCV 85.9  --  88.7  --  88.7 87.0 88.6  PLT 256  --  289  --  303 217 296   < > = values in this interval not displayed.    Coagulation Studies: No results for input(s): LABPROT, INR in the last 72 hours.  Imaging No new brain imaging overnight:   ASSESSMENT AND PLAN: 74 year old male who presented with new onset status epilepticus and was found to have meningitis.  New onset status epilepticus (resolved) Meningitis Acute encephalopathy, infectious -LP showed 140 WBCs with 64% neutrophils and 32% lymphocytes, protein 112 and glucose less than 20.  Seizures can occasionally cause pleocytosis.  However patient was borderline febrile (temperature 100.3 F) and  had behavioral changes preceding the seizure for a few days which along with CSF findings is very concerning for meningitis.  HSV IgG was elevated at 2.02 but PCR came back negative.  VDRL is negative, cryptococcal antigen is negative.  QuantiFERON gold was ordered by ICU attending due to CSF findings in the setting of recent immunosuppression which was initially indeterminate and later negative.  Arbovirus panel is pending as well as AFB smear and culture.  Recommendations - Plavix was held yesterday with plan to repeat lumbar puncture with high-volume tap to perform further testing as recommended by ID as well as paraneoplastic panel - We will check P2Y12 and if normal can proceed with lumbar puncture today. - Continue Keppra 1000 mg twice daily - MRI brain on 02/14/2020 showed signal abnormality in the left thalamus concerning for acute infarct versus seizure related.  Will likely need MRI brain without contrast in 8 to 12 weeks to see if MRI changes resolved which would make it more likely that these were seizure related. -Continue seizure precautions  -as needed IV Ativan 2 mg for clinical seizure-like activity -I called and spoke with patient's daughter Ms. [name], and discussed the plan for  lumbar puncture, discussed risks and benefits, she provided consent to proceed with the procedure whenever we are ready.  I have spent a total of 35  minuteswith the patient reviewing hospitalnotes,  test results, labs and examining the patient as well as establishing an assessment and plan that was discussed personally with the patient's physician Dr. Doristine Bosworth, Dr. Lucianne Lei Dam.>50% of time was spent in direct patient care.  [facility] For questions after 5pm please refer to AMION to reach the Neurologist on call

## 2020-02-22 ENCOUNTER — Encounter (HOSPITAL_COMMUNITY): Payer: Self-pay | Admitting: Internal Medicine

## 2020-02-22 DIAGNOSIS — Z4659 Encounter for fitting and adjustment of other gastrointestinal appliance and device: Secondary | ICD-10-CM

## 2020-02-22 DIAGNOSIS — G030 Nonpyogenic meningitis: Secondary | ICD-10-CM | POA: Diagnosis not present

## 2020-02-22 DIAGNOSIS — E876 Hypokalemia: Secondary | ICD-10-CM

## 2020-02-22 DIAGNOSIS — G40901 Epilepsy, unspecified, not intractable, with status epilepticus: Secondary | ICD-10-CM | POA: Diagnosis not present

## 2020-02-22 LAB — PROTEIN AND GLUCOSE, CSF
Glucose, CSF: 21 mg/dL — CL (ref 40–70)
Total  Protein, CSF: 140 mg/dL — ABNORMAL HIGH (ref 15–45)

## 2020-02-22 LAB — BASIC METABOLIC PANEL
Anion gap: 12 (ref 5–15)
BUN: 15 mg/dL (ref 8–23)
CO2: 27 mmol/L (ref 22–32)
Calcium: 9.7 mg/dL (ref 8.9–10.3)
Chloride: 97 mmol/L — ABNORMAL LOW (ref 98–111)
Creatinine, Ser: 1.38 mg/dL — ABNORMAL HIGH (ref 0.61–1.24)
GFR, Estimated: 51 mL/min — ABNORMAL LOW (ref >60)
Glucose, Bld: 119 mg/dL — ABNORMAL HIGH (ref 70–99)
Potassium: 3.9 mmol/L (ref 3.5–5.1)
Sodium: 136 mmol/L (ref 135–145)

## 2020-02-22 LAB — MISC LABCORP TEST (SEND OUT): Labcorp test code: 830945

## 2020-02-22 LAB — PHOSPHORUS: Phosphorus: 2.8 mg/dL (ref 2.5–4.6)

## 2020-02-22 LAB — CSF CELL COUNT WITH DIFFERENTIAL
Eosinophils, CSF: 0 % (ref 0–1)
Lymphs, CSF: 42 % (ref 40–80)
Monocyte-Macrophage-Spinal Fluid: 0 % — ABNORMAL LOW (ref 15–45)
RBC Count, CSF: 0 /mm3
Segmented Neutrophils-CSF: 58 % — ABNORMAL HIGH (ref 0–6)
Tube #: 3
WBC, CSF: 180 /mm3 (ref 0–5)

## 2020-02-22 LAB — CYTOLOGY - NON PAP

## 2020-02-22 LAB — GLUCOSE, CAPILLARY
Glucose-Capillary: 101 mg/dL — ABNORMAL HIGH (ref 70–99)
Glucose-Capillary: 102 mg/dL — ABNORMAL HIGH (ref 70–99)
Glucose-Capillary: 107 mg/dL — ABNORMAL HIGH (ref 70–99)
Glucose-Capillary: 118 mg/dL — ABNORMAL HIGH (ref 70–99)
Glucose-Capillary: 128 mg/dL — ABNORMAL HIGH (ref 70–99)
Glucose-Capillary: 135 mg/dL — ABNORMAL HIGH (ref 70–99)
Glucose-Capillary: 197 mg/dL — ABNORMAL HIGH (ref 70–99)

## 2020-02-22 LAB — PLATELET INHIBITION P2Y12: Platelet Function  P2Y12: 177 [PRU] — ABNORMAL LOW (ref 182–335)

## 2020-02-22 MED ORDER — DEXAMETHASONE SODIUM PHOSPHATE 10 MG/ML IJ SOLN
18.0000 mg | Freq: Every day | INTRAMUSCULAR | Status: DC
  Administered 2020-02-22 – 2020-03-06 (×14): 18 mg via INTRAVENOUS
  Filled 2020-02-22 (×16): qty 2

## 2020-02-22 MED ORDER — SODIUM CHLORIDE 0.9 % IV BOLUS FOR AMBISOME
500.0000 mL | INTRAVENOUS | Status: DC
  Administered 2020-02-22 – 2020-02-28 (×7): 500 mL via INTRAVENOUS

## 2020-02-22 MED ORDER — DEXTROSE 5% FOR FLUSHING BEFORE AND AFTER AMBISOME
10.0000 mL | INTRAVENOUS | Status: DC
  Administered 2020-02-23 – 2020-03-03 (×12): 10 mL via INTRAVENOUS
  Filled 2020-02-22 (×10): qty 50

## 2020-02-22 MED ORDER — MEPERIDINE HCL 25 MG/ML IJ SOLN: 25.0000 mg | INTRAMUSCULAR | Status: DC | PRN

## 2020-02-22 MED ORDER — VITAMIN B-6 50 MG PO TABS
50.0000 mg | ORAL_TABLET | Freq: Every day | ORAL | Status: DC
  Administered 2020-02-22 – 2020-03-03 (×11): 50 mg via ORAL
  Filled 2020-02-22 (×11): qty 1

## 2020-02-22 MED ORDER — AMLODIPINE BESYLATE 10 MG PO TABS
10.0000 mg | ORAL_TABLET | Freq: Every day | ORAL | Status: DC
  Administered 2020-02-22 – 2020-03-12 (×19): 10 mg via ORAL
  Filled 2020-02-22 (×21): qty 1

## 2020-02-22 MED ORDER — DIPHENHYDRAMINE HCL 50 MG/ML IJ SOLN: 25.0000 mg | Freq: Every day | INTRAMUSCULAR | Status: DC | PRN

## 2020-02-22 MED ORDER — RIFAMPIN 300 MG PO CAPS
600.0000 mg | ORAL_CAPSULE | Freq: Every day | ORAL | Status: DC
  Administered 2020-02-22 – 2020-02-25 (×4): 600 mg via ORAL
  Filled 2020-02-22 (×5): qty 2

## 2020-02-22 MED ORDER — DEXTROSE 5 % IV SOLN
10.0000 mg/kg | Freq: Two times a day (BID) | INTRAVENOUS | Status: DC
  Administered 2020-02-23 – 2020-02-24 (×3): 615 mg via INTRAVENOUS
  Filled 2020-02-22 (×4): qty 12.3

## 2020-02-22 MED ORDER — PYRAZINAMIDE 500 MG PO TABS
1500.0000 mg | ORAL_TABLET | Freq: Every day | ORAL | Status: DC
  Administered 2020-02-22 – 2020-03-03 (×11): 1500 mg via ORAL
  Filled 2020-02-22 (×11): qty 3

## 2020-02-22 MED ORDER — DEXTROSE 5% FOR FLUSHING BEFORE AND AFTER AMBISOME
10.0000 mL | INTRAVENOUS | Status: DC
  Administered 2020-02-22 – 2020-03-02 (×11): 10 mL via INTRAVENOUS
  Filled 2020-02-22 (×11): qty 50

## 2020-02-22 MED ORDER — SODIUM CHLORIDE 0.9 % IV BOLUS FOR AMBISOME
500.0000 mL | INTRAVENOUS | Status: DC
  Administered 2020-02-23 – 2020-02-27 (×6): 500 mL via INTRAVENOUS

## 2020-02-22 MED ORDER — DIPHENHYDRAMINE HCL 25 MG PO CAPS: 25.0000 mg | ORAL_CAPSULE | Freq: Every day | ORAL | Status: DC | PRN

## 2020-02-22 MED ORDER — DEXTROSE 5 % IV SOLN
5.0000 mg/kg | INTRAVENOUS | Status: DC
  Administered 2020-02-22 – 2020-03-02 (×11): 310 mg via INTRAVENOUS
  Filled 2020-02-22 (×13): qty 77.5

## 2020-02-22 MED ORDER — ETHAMBUTOL HCL 400 MG PO TABS
1200.0000 mg | ORAL_TABLET | Freq: Every day | ORAL | Status: DC
  Administered 2020-02-22 – 2020-03-03 (×11): 1200 mg via ORAL
  Filled 2020-02-22 (×11): qty 3

## 2020-02-22 MED ORDER — ACETAMINOPHEN 325 MG PO TABS
650.0000 mg | ORAL_TABLET | Freq: Every day | ORAL | Status: DC | PRN
  Administered 2020-03-02: 650 mg via ORAL
  Filled 2020-02-22: qty 2

## 2020-02-22 MED ORDER — ISONIAZID 300 MG PO TABS
300.0000 mg | ORAL_TABLET | Freq: Every day | ORAL | Status: DC
  Administered 2020-02-22 – 2020-03-03 (×11): 300 mg via ORAL
  Filled 2020-02-22 (×11): qty 1

## 2020-02-22 NOTE — Progress Notes (Addendum)
PROGRESS NOTE    Howard Allen  MLY:[id number] DOB: 05-03-1946 DOA: 02/13/2020 PCP: System, Provider Not In   Brief Narrative: The patient is a 74 year old Caucasian male with a past medical history significant for but not limited to Crohn's disease, hypertension, hyperlipidemia, depression history of hospitalization in June 2021 for pneumatosis status post ileocolonic resection and lysis of adhesions who presented with multiple tonic-clonic seizures complicated by status epilepticus that required intubation and loading with Keppra. CT of the head was negative and initially when he was brought in to the ED by EMS there was at least 3 witnessed generalized tonic-clonic seizures noted. PCCM was consulted for admission given that he was intubated on arrival and unresponsive. He was intubated on 02/13/2020 and extubated 02/15/2020. Neurology was consulted and he underwent further work-up and was found to have a signal abnormality in the left limb is most consistent with an acute infarct. Echocardiogram done showed an EF of 55 to 60% with moderate LVH and grade 1 diastolic dysfunction. EEG testing on 10 9 was negative. 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 He underwent several viral serologies and had a lumbar puncture sent on 02/14/2020. He was started on IV vancomycin, IV ceftriaxone, IV ampicillin and IV acyclovir for suspected meningitis. He was transferred to [facility] service on 02/19/2020 and remains confused. Neurology is following but did not see the patient today and PT OT recommending CIR and are prior to going back to [place] where patient's daughter lives.   Assessment & Plan:   Principal Problem:   Aseptic meningitis Active Problems:   Seizure ([place])   Weight loss  Meningitis  - LP performed on 10/7 consistent with meningitis - Cultures are NGTD at 3 Days; HSV with positive IgG...  pcr negative. - VDLR and cryptococcal antigen negative., and West nile serology pending - Quantiferon gold was indeterminate.  Patient was on vancomycin, ceftriaxone and acyclovir.  Consulted ID on 02/20/2020.  Dr. Tommy Medal recommended repeat large-volume LP to send more labs.  Unfortunately, patient was already on Plavix which I discontinued on 02/20/2020.  Rocephin and vancomycin discontinued but acyclovir continues.  Had a long multidisciplinary discussion between me, Dr. Tommy Medal and Dr. Hortense Ramal of neurology on 02/21/2020.  Checked P2 Y12 however it was too low.  Not safe to do LP according to neurology.  Neurology to assess timing of LP.  Defer to ID about starting empiric antibiotics or waiting until LP done.  Plavix discontinued on 02/20/2020.  Large hematoma above right eye: Hematoma is causing difficulty for him to open his right eye.  I have paged general surgery to see if his hematoma will need to be evacuated.  Waiting for callback.  Addendum: Received a phone call from general surgery.  They informed me that since it is right forehead/over the eye, they do not work on that area and recommended to contact ENT.  I contacted ENT this morning and finally received a call at 3:30 PM and was able to speak to Dr. Christ Kick who is going to evaluate patient this afternoon for possible evacuation of the hematoma.  Acute Ischemic Infarct  -Seen on MRI imaging on 10/7: left thalamic signal abnormality measuring 2.3 cm most consistent with acute infarct; no edema or hemorrhagic conversion. 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 2 mm left paraophthalmic ICA aneurysm. Widely patent cervical carotid and vertebral arteries. Right upper lobe pulmonary nodules measuring up to 5 mm. Aortic Atherosclerosis  (ICD10-I70.0) and Emphysema." -  now on atorvastatin 80 mg despite that he takes 20 at home given his acute CVA - TTE negative for vegetations or thrombosis.  - Continue high intensity statin; Lipid panel done and showed a total cholesterol/HDL ratio 3.3, cholesterol level 154, HDL level 46, LDL 79, triglycerides 146, VLDL 29 -PT OT recommending CIR next-appreciate neurology further evaluation recommendations.  We will continue aspirin but discontinued Plavix for the reasons mentioned above.  Tonic-Clonic Seizures Status Epilepticus: Resolved - Secondary to meningitis  - Neurology on board; appreciate their recommendations - Continue Keppra IV and when can take PO cont 1000mg  BID -We will need SLP evaluation  Hypomagnesemia Resolved.  Hypophosphatemia Resolved.  B12 Deficiency  Moderate Malnutrition - Likely due to malnutrition and anorexia, possibly worsened by malabsorption given bowel inflammation seen on imaging the past several months prior to resection. Of note, 70 lb weight loss in the past 10 months.  - Consult to dietician for tube feed initiation, he was started on tube feedings which was discontinued now that he is tolerating dysphagia 2 diet. Nutritionist now recommending hormonal shake twice daily as well as Magic cup twice daily with meals and multivitamin with minerals daily.  HTN/HLD -Home medications include Toprol 100 mg, Amlodipine 5 mg, Lipitor 20 mg, Fenofibrate 160 mg.  Blood pressure still slightly elevated.  Will increase amlodipine to 10 mg but continue Toprol-XL 100 mg p.o. daily.   Normocytic Anemia  Hx of Iron Deficiency Anemia B12 Deficiency  - B12 levels low. Iron panel shows low saturation, however normal iron and TIBC, unclear picture as not consistent completely with iron deficiency anemia or anemia of chronic disease.  Hemoglobin is stable.  ? Crohn's Disease s/p Ileocolic Resection  - No longer on immunosuppressive therapy. -Takes budesonide  as an outpatient but this has not been resumed yet.  AKI on CKD stage II Back to normal.  DVT prophylaxis: SCDs Heparin 5000 units subcu every 8 hours Code Status: FULL CODE  Family Communication: No family present at bedside.  Daughter Threasa Beards updated today. Disposition Plan: Pending further clinical improvement back to baseline and clearance by neurology prior to safe discharge disposition  Status is: Inpatient  Remains inpatient appropriate because:Altered mental status, Unsafe d/c plan, IV treatments appropriate due to intensity of illness or inability to take PO and Inpatient level of care appropriate due to severity of illness  Dispo: The patient is from: Home              Anticipated d/c is to: CIR              Anticipated d/c date is:> 3 days              Patient currently is not medically stable to d/c.  Consultants:   Neurology  PCCM Transfer   Procedures:  10/6 >> ETT placed 10/7 >> LP 10/8 >> Extubated   Significant Diagnostic Tests:  10/6 CT Head >> No acute intracranial abnormalities; age-related volume loss and chronic small vessel disease 10/7 MRI brain: 1. Signal abnormality in the left thalamus most consistent with an acute infarct. 2. Signal abnormality in the mesial left temporal lobe and splenium of the corpus callosum which may reflect  acute infarct, sequelae of recent seizure activity, or encephalitis (including herpes encephalitis). No contralateral cerebral involvement. 3. Mild chronic small vessel ischemic disease. 10/8 echo: LVEF 55-60%, mod LVH, Grade I diastolic dysfunction 04/5 CT Head >> Progressive low density in the left thalamus related to acute infarction; no hemorrhage 10/9 EEG >> Negative  Micro Data:  10/6 COVID-19 + influenza >> Negative  10/6 Blood Cultures >> NGTD 10/7 CSF Culture >> NGTD 10/7 HSV serology >> 2.02 on igg testing. Will send pcr to determine past or active.  10/7 West Nile serology >>  10/7 VRDL serology  >> 10/8 Quanitferon Gold  >> indeterminant 10/9 acid fast csf:  10/11 HSV PCR: pending   Antimicrobials:  Anti-infectives (From admission, onward)   Start     Dose/Rate Route Frequency Ordered Stop   02/20/20 1000  vancomycin (VANCOCIN) IVPB 1000 mg/200 mL premix  Status:  Discontinued        1,000 mg 200 mL/hr over 60 Minutes Intravenous Every 24 hours 02/20/20 0555 02/21/20 1259   02/19/20 1811  vancomycin variable dose per unstable renal function (pharmacist dosing)  Status:  Discontinued         Does not apply See admin instructions 02/19/20 1811 02/20/20 0555   02/18/20 0500  vancomycin (VANCOREADY) IVPB 750 mg/150 mL  Status:  Discontinued        750 mg 150 mL/hr over 60 Minutes Intravenous Every 12 hours 02/17/20 1651 02/19/20 1811   02/15/20 0300  vancomycin (VANCOREADY) IVPB 500 mg/100 mL        500 mg 100 mL/hr over 60 Minutes Intravenous Every 12 hours 02/14/20 1352 02/17/20 1755   02/14/20 1400  acyclovir (ZOVIRAX) 690 mg in dextrose 5 % 100 mL IVPB        10 mg/kg  68.9 kg 113.8 mL/hr over 60 Minutes Intravenous Every 12 hours 02/14/20 1245     02/14/20 1400  vancomycin (VANCOREADY) IVPB 1250 mg/250 mL        1,250 mg 166.7 mL/hr over 90 Minutes Intravenous STAT 02/14/20 1352 02/14/20 1655   02/14/20 1300  cefTRIAXone (ROCEPHIN) 2 g in sodium chloride 0.9 % 100 mL IVPB  Status:  Discontinued        2 g 200 mL/hr over 30 Minutes Intravenous Every 12 hours 02/14/20 1245 02/20/20 1732   02/14/20 1300  ampicillin (OMNIPEN) 2 g in sodium chloride 0.9 % 100 mL IVPB  Status:  Discontinued        2 g 300 mL/hr over 20 Minutes Intravenous Every 6 hours 02/14/20 1245 02/18/20 1051       Subjective: Seen and examined.  Alert with dysarthria but not oriented.  Oriented to person only.  Denies any complaint.  Received a message from the nurse that this morning, staff noted significant amount of blood on his pillow.  On examination, he now has about 10 x 10 cm large hematoma right  about his right eye where his laceration was and this is causing difficulty for the patient to open his eye.  Bleeding has stopped.  Patient keeps picking up on his laceration.  He has mittens on his left hand.  Objective: Vitals:   02/21/20 2007 02/22/20 0005 02/22/20 0419 02/22/20 0830  BP: (!) 163/80 (!) 144/89 (!) 156/86 (!) 161/94  Pulse: 70 80 68 77  Resp: 18 18 18 18   Temp: 98.8 F (37.1 C) 97.8 F (36.6 C) 97.6 F (36.4 C) 98.6 F (37 C)  TempSrc: Axillary Oral Oral Oral  SpO2: 99% 100% 100% 100%  Weight:      Height:        Intake/Output Summary (Last 24 hours) at 02/22/2020 1030 Last data filed at 02/22/2020 0900 Gross per 24 hour  Intake 760 ml  Output 900 ml  Net -140 ml   Filed Weights   02/19/20 0500 02/20/20 0211 02/21/20 0322  Weight: 61.1 kg 60.8 kg 61.3 kg   Examination: Physical Exam:  General exam: Appears calm and comfortable  Respiratory system: Clear to auscultation. Respiratory effort normal. Cardiovascular system: S1 & S2 heard, RRR. No JVD, murmurs, rubs, gallops or clicks. No pedal edema. Gastrointestinal system: Abdomen is nondistended, soft and nontender. No organomegaly or masses felt. Normal bowel sounds heard. Central nervous system: Alert and oriented to person only.  No focal deficit. Extremities: Symmetric 5 x 5 power. Skin: Large laceration above right eye with a large hematoma now.   Data Reviewed: I have personally reviewed following labs and imaging studies  CBC: Recent Labs  Lab 02/16/20 0729 02/16/20 1116 02/17/20 0329 02/18/20 0419 02/21/20 0814  WBC 9.4  --  7.6 7.9 7.4  NEUTROABS  --   --   --   --  5.5  HGB 8.5* 8.5* 8.9* 9.5* 10.4*  HCT 26.7* 25.0* 28.3* 29.4* 32.6*  MCV 88.7  --  88.7 87.0 88.6  PLT 289  --  303 217 097   Basic Metabolic Panel: Recent Labs  Lab 02/16/20 0729 02/16/20 1116 02/17/20 0329 02/17/20 0329 02/18/20 0419 02/18/20 0419 02/19/20 0641 02/19/20 1813 02/20/20 0449 02/21/20 0432  02/22/20 0804  NA 140   < > 141   < > 137  --  135  --  134* 131* 136  K 3.8   < > 3.8   < > 4.2  --  3.8  --  3.4* 3.8 3.9  CL 105   < > 107   < > 103  --  96*  --  99 98 97*  CO2 24   < > 24   < > 19*  --  24  --  24 22 27   GLUCOSE 128*   < > 106*   < > 108*  --  93  --  97 102* 119*  BUN 14   < > 18   < > 20  --  18  --  15 14 15   CREATININE 1.23   < > 1.18   < > 1.18   < > 1.27* 1.29* 1.20 1.16 1.38*  CALCIUM 8.7*   < > 9.1   < > 9.0  --  9.4  --  8.8* 8.9 9.7  MG 1.7  --  1.9  --  2.2  --  1.6*  --  2.0  --   --   PHOS 2.8   < > 2.8   < > 2.7  --  1.8*  --  3.0 2.8 2.8   < > = values in this interval not displayed.   GFR: Estimated Creatinine Clearance: 42 mL/min (A) (by C-G formula based on SCr of 1.38 mg/dL (H)). Liver Function Tests: No results for input(s): AST, ALT, ALKPHOS, BILITOT, PROT, ALBUMIN in the last 168 hours. No results for input(s): LIPASE, AMYLASE in the last 168 hours. No results for input(s): AMMONIA in the last 168 hours. Coagulation Profile: No results for input(s): INR, PROTIME in the last 168 hours. Cardiac Enzymes: No results for input(s): CKTOTAL, CKMB, CKMBINDEX, TROPONINI in the last 168 hours.  BNP (last 3 results) No results for input(s): PROBNP in the last 8760 hours. HbA1C: No results for input(s): HGBA1C in the last 72 hours. CBG: Recent Labs  Lab 02/21/20 1603 02/21/20 2004 02/22/20 0006 02/22/20 0423 02/22/20 0820  GLUCAP 103* 108* 118* 101* 102*   Lipid Profile: No results for input(s): CHOL, HDL, LDLCALC, TRIG, CHOLHDL, LDLDIRECT in the last 72 hours. Thyroid Function Tests: No results for input(s): TSH, T4TOTAL, FREET4, T3FREE, THYROIDAB in the last 72 hours. Anemia Panel: No results for input(s): VITAMINB12, FOLATE, FERRITIN, TIBC, IRON, RETICCTPCT in the last 72 hours. Sepsis Labs: No results for input(s): PROCALCITON, LATICACIDVEN in the last 168 hours.  Recent Results (from the past 240 hour(s))  Respiratory Panel by RT PCR  (Flu A&B, Covid) - Nasopharyngeal Swab     Status: None   Collection Time: 02/13/20  4:17 PM   Specimen: Nasopharyngeal Swab  Result Value Ref Range Status   SARS Coronavirus 2 by RT PCR NEGATIVE NEGATIVE Final    Comment: (NOTE) SARS-CoV-2 target nucleic acids are NOT DETECTED.  The SARS-CoV-2 RNA is generally detectable in upper respiratoy specimens during the acute phase of infection. The lowest concentration of SARS-CoV-2 viral copies this assay can detect is 131 copies/mL. A negative result does not preclude SARS-Cov-2 infection and should not be used as the sole basis for treatment or other patient management decisions. A negative result may occur with  improper specimen collection/handling, submission of specimen other than nasopharyngeal swab, presence of viral mutation(s) within the areas targeted by this assay, and inadequate number of viral copies (<131 copies/mL). A negative result must be combined with clinical observations, patient history, and epidemiological information. The expected result is Negative.  Fact Sheet for Patients:  PinkCheek.be  Fact Sheet for Healthcare Providers:  GravelBags.it  This test is no t yet approved or cleared by the [place] FDA and  has been authorized for detection and/or diagnosis of SARS-CoV-2 by FDA under an Emergency Use Authorization (EUA). This EUA will remain  in effect (meaning this test can be used) for the duration of the COVID-19 declaration under Section 564(b)(1) of the Act, 21 U.S.C. section 360bbb-3(b)(1), unless the authorization is terminated or revoked sooner.     Influenza A by PCR NEGATIVE NEGATIVE Final   Influenza B by PCR NEGATIVE NEGATIVE Final    Comment: (NOTE) The Xpert Xpress SARS-CoV-2/FLU/RSV assay is intended as an aid in  the diagnosis of influenza from Nasopharyngeal swab specimens and  should not be used as a sole basis for treatment.  Nasal washings and  aspirates are unacceptable for Xpert Xpress SARS-CoV-2/FLU/RSV  testing.  Fact Sheet for Patients: PinkCheek.be  Fact Sheet for Healthcare Providers: GravelBags.it  This test is not yet approved or cleared by the [place] FDA and  has been authorized for detection and/or diagnosis of SARS-CoV-2 by  FDA under an Emergency Use Authorization (EUA). This EUA will remain  in effect (meaning this test can be used) for the duration of the  Covid-19 declaration under Section 564(b)(1) of the Act, 21  U.S.C. section 360bbb-3(b)(1), unless the authorization is  terminated or revoked. Performed at [facility], [place] [address], [place], [place] [address]   Culture, blood (Routine X 2) w Reflex to ID Panel     Status: None   Collection Time: 02/13/20  4:57 PM   Specimen: BLOOD LEFT FOREARM  Result Value Ref Range Status   Specimen Description BLOOD LEFT FOREARM  Final   Special  Requests   Final    BOTTLES DRAWN AEROBIC AND ANAEROBIC Blood Culture results may not be optimal due to an excessive volume of blood received in culture bottles   Culture   Final    NO GROWTH 5 DAYS Performed at [facility], [place] [address] [place], [place] [address]    Report Status 02/18/2020 FINAL  Final  Culture, blood (Routine X 2) w Reflex to ID Panel     Status: None   Collection Time: 02/13/20  5:02 PM   Specimen: BLOOD LEFT WRIST  Result Value Ref Range Status   Specimen Description BLOOD LEFT WRIST  Final   Special Requests   Final    BOTTLES DRAWN AEROBIC AND ANAEROBIC Blood Culture results may not be optimal due to an excessive volume of blood received in culture bottles   Culture   Final    NO GROWTH 5 DAYS Performed at [facility], [place] [address] [place], [place] [address]    Report Status 02/18/2020 FINAL  Final  MRSA PCR Screening     Status: Abnormal   Collection Time: 02/14/20  3:03 AM    Specimen: Nasal Mucosa; Nasopharyngeal  Result Value Ref Range Status   MRSA by PCR POSITIVE (A) NEGATIVE Final    Comment:        The GeneXpert MRSA Assay (FDA approved for NASAL specimens only), is one component of a comprehensive MRSA colonization surveillance program. It is not intended to diagnose MRSA infection nor to guide or monitor treatment for MRSA infections. RESULT CALLED TO, READ BACK BY AND VERIFIED WITH: Lillie Fragmin RN 8:25 02/14/20 ([email or web address]) Performed at [facility], [place] [address], [place], [place] [address]   CSF culture     Status: None   Collection Time: 02/14/20 11:01 AM   Specimen: CSF; Cerebrospinal Fluid  Result Value Ref Range Status   Specimen Description CSF  Final   Special Requests NONE  Final   Gram Stain   Final    WBC PRESENT,BOTH PMN AND MONONUCLEAR NO ORGANISMS SEEN CYTOSPIN SMEAR    Culture   Final    NO GROWTH 3 DAYS Performed at [facility], [place] [address], [place], [place] [address]    Report Status 02/17/2020 FINAL  Final  Acid Fast Smear (AFB)     Status: None   Collection Time: 02/17/20  1:11 PM   Specimen: PATH Cytology CSF  Result Value Ref Range Status   AFB Specimen Processing Concentration  Final   Acid Fast Smear Negative  Final    Comment: (NOTE) Performed At: [facility] [place], [place] [id number] Rush Farmer MD MV:[id number]    Source (AFB) CSF  Final    Comment: Performed at [facility], [place] [address], [place], [place] [address]  Culture, fungus without smear     Status: None (Preliminary result)   Collection Time: 02/17/20  1:14 PM   Specimen: CSF; Cerebrospinal Fluid  Result Value Ref Range Status   Specimen Description CSF  Final   Special Requests NONE  Final   Culture   Final    NO FUNGUS ISOLATED AFTER 2 DAYS Performed at [facility], [place] [address] [place], [place] [address]    Report Status PENDING  Incomplete     RN Pressure Injury  Documentation:     Estimated body mass index is 19.39 kg/m as calculated from the following:   Height as of this  encounter: 5\' 10"  (1.778 m).   Weight as of this encounter: 61.3 kg.  Malnutrition Type:  Nutrition Problem: Moderate Malnutrition   Malnutrition Characteristics:  Signs/Symptoms: mild fat depletion, moderate fat depletion, mild muscle depletion, moderate muscle depletion   Nutrition Interventions:  Interventions: Hormel Shake, Magic cup, MVI   Radiology Studies: CT CHEST W CONTRAST  Result Date: 02/20/2020 CLINICAL DATA:  Sepsis EXAM: CT CHEST WITH CONTRAST TECHNIQUE: Multidetector CT imaging of the chest was performed during intravenous contrast administration. CONTRAST:  72mL OMNIPAQUE IOHEXOL 300 MG/ML  SOLN COMPARISON:  Chest radiograph 02/18/2020 FINDINGS: Cardiovascular: Normal heart size. Trace pericardial fluid with some mild pericardial hazy stranding anteriorly towards the apex (3/113, 97) coronary artery calcifications are present. Central pulmonary arteries are normal caliber. The aortic root is suboptimally assessed given cardiac pulsation artifact. Atherosclerotic plaque within the normal caliber aorta. No acute luminal abnormality of the imaged aorta. No periaortic stranding or hemorrhage. Shared origin of the brachiocephalic and left common carotid arteries. Minimal plaque in the proximal great vessels which are otherwise unremarkable. Central pulmonary arteries are normal caliber. No large central or lobar filling defects on this non tailored examination of the pulmonary arteries. No major venous abnormality. Mediastinum/Nodes: No mediastinal fluid or gas. Normal thyroid gland and thoracic inlet. No acute abnormality of the esophagus. Secretions seen in the trachea and central airways. No worrisome mediastinal, hilar or axillary adenopathy. Lungs/Pleura: Centrilobular emphysema changes present throughout the lungs. Scattered calcified granulomata bilaterally.  Some more clustered branching nodules seen in the periphery of the anterior segment right upper lobe may be infectious or inflammatory or related to mucus impacted airways given more diffuse airways thickening and scattered secretions elsewhere. Trace left effusion. Some adjacent passive atelectasis with more dependent atelectatic changes both lung bases posteriorly. Some bandlike opacities likely reflect atelectasis or scarring. Upper Abdomen: Gallbladder is decompressed. Some irregular thickening of the gallbladder wall with possible layering calcified biliary sand or small gallstones. Question some faint pericholecystic inflammation. Upper abdomen is otherwise unremarkable. Musculoskeletal: No chest wall abnormality. No acute or significant osseous findings. IMPRESSION: 1. Trace pericardial fluid with some mild pericardial hazy stranding anteriorly towards the apex, nonspecific but can be seen with pericarditis. 2. Scattered airways thickening and secretions with some more branching opacities in the periphery of the anterior segment right upper lobe, could reflect some mucous impaction or mild infection or inflammatory change. 3. Trace left effusion with adjacent passive atelectasis. 4. Gallbladder is decompressed with irregular thickening of the gallbladder wall with possible layering calcified biliary sand or small gallstones. Question some faint pericholecystic inflammation. If there is concern for acute cholecystitis, recommend further evaluation with right upper quadrant ultrasound. 5. Evidence of prior granulomatous disease within the chest with scattered calcified granulomata throughout the lungs. 6. Aortic Atherosclerosis (ICD10-I70.0) 7. Emphysema (ICD10-J43.9). Electronically Signed   By: Lovena Le M.D.   On: 02/20/2020 22:10   Scheduled Meds: . amLODipine  5 mg Oral Daily  . aspirin  81 mg Oral Daily  . atorvastatin  80 mg Oral Daily  . Chlorhexidine Gluconate Cloth  6 each Topical Daily  .  docusate  100 mg Oral BID  . fenofibrate  160 mg Oral Daily  . heparin  5,000 Units Subcutaneous Q8H  . insulin aspart  0-9 Units Subcutaneous Q4H  . metoprolol succinate  100 mg Oral Daily  . multivitamin with minerals  1 tablet Oral Daily  . pantoprazole (PROTONIX) IV  40 mg Intravenous Daily  . polyethylene glycol  17  g Oral Daily  . potassium chloride  40 mEq Oral Daily  . vitamin B-12  1,000 mcg Oral Daily   Continuous Infusions: . sodium chloride 250 mL (02/21/20 2236)  . acyclovir 690 mg (02/21/20 2239)  . levETIRAcetam 1,000 mg (02/22/20 0435)    LOS: 9 days   Darliss Cheney, MD [facility] PAGER is on [place]  If 7PM-7AM, please contact night-coverage [email or web address]

## 2020-02-22 NOTE — Consult Note (Signed)
ENT CONSULT:  Reason for Consult: Right supraorbital hematoma  Referring Physician:  Dr. Doristine Bosworth  HPI: Howard Allen is an 74 y.o. male with PMH of Crohn's Disease, HTN, HL, Depression, hospitalization in 10/2019 for pneumatosis s/p ileocolic resection and lysis of adhesions who has had mulitple recent seizures and was treated at outside hospitals prior to this admission.  Pt was brought in by EMS for at least three generalized tonic-clonic seizures on 02/13/2020; he presented unresponsive and was intubated on arrival, he was extubated on 02/15/2020. 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  Patient was then placed on aspirin and Plavix. 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  Prior to this admission, patient had repair of right supraorbital laceration by emergency department, which he sustained after falling during a seizure.  Per consulting physician, patient had attempted to pull at his sutures causing development of a focal hematoma.  Plavix has been held.  At time of examination, patient is able to answer questions intermittently, but he does appear moderately confused.   Past Medical History:  Diagnosis Date  . Aseptic meningitis 02/21/2020  . Weight loss 02/21/2020    No family history on file.  Social History:  has no history on file for tobacco use, alcohol use, and drug use.  Allergies:  Allergies  Allergen Reactions  . Lisinopril Anaphylaxis, Cough and Other (See Comments)    Other reaction(s): anaphylaxis Pt states it made him "cough his head off"   . Lorazepam     Other reaction(s): Hallucinations    Medications: I have reviewed the patient's current medications.  Results for orders placed or performed during the hospital encounter of 02/13/20 (from the past 48 hour(s))  Miscellaneous [facility] test (send-out)     Status: None    Collection Time: 02/20/20 10:29 PM  Result Value Ref Range   [facility] test code [id number]     Comment: CORRECTED ON 10/13 AT 2259: PREVIOUSLY REPORTED AS [id number]   [facility] test name      COCCIDIODES ANTIBODIES BY IMMUNE DIFFUSION COMPLEMENT FIXATION    Comment: CORRECTED ON 10/13 AT 2301: PREVIOUSLY REPORTED AS COCCIDIODES , CORRECTED ON 10/13 AT 2259: PREVIOUSLY REPORTED AS [id number]   Source ([facility])      Standardized susceptibility testing for this organism is not available.    Comment: Performed at [facility], [place] [address], [place], [place] [address]   Misc [facility] result COMMENT     Comment: (NOTE) Performed At: [facility] [address] [place], [place] [id number] Rush Farmer MD DG:[id number]   Glucose, capillary     Status: Abnormal   Collection Time: 02/20/20 11:21 PM  Result Value Ref Range   Glucose-Capillary 100 (H) 70 - 99 mg/dL    Comment: Glucose reference range applies only to samples taken after fasting for at least 8 hours.   Comment 1 Notify RN   Glucose, capillary     Status: None   Collection Time: 02/21/20  3:21 AM  Result Value Ref Range   Glucose-Capillary 78 70 - 99 mg/dL    Comment: Glucose reference range applies only to samples taken after fasting for at least 8 hours.   Comment 1 Notify RN    Comment 2 Document in Chart   Phosphorus     Status: None   Collection Time: 02/21/20  4:32 AM  Result Value Ref Range   Phosphorus 2.8 2.5 -  4.6 mg/dL    Comment: Performed at [facility], [place] [address], [place], [place] [address]  Basic metabolic panel     Status: Abnormal   Collection Time: 02/21/20  4:32 AM  Result Value Ref Range   Sodium 131 (L) 135 - 145 mmol/L   Potassium 3.8 3.5 - 5.1 mmol/L   Chloride 98 98 - 111 mmol/L   CO2 22 22 - 32 mmol/L   Glucose, Bld 102 (H) 70 - 99 mg/dL    Comment: Glucose reference range applies only to samples taken after fasting for at least 8 hours.   BUN 14 8 - 23 mg/dL   Creatinine, Ser 1.16  0.61 - 1.24 mg/dL   Calcium 8.9 8.9 - 10.3 mg/dL   GFR, Estimated >60 >60 mL/min   Anion gap 11 5 - 15    Comment: Performed at [place] [facility] [address] [place], [place] [address]  Glucose, capillary     Status: None   Collection Time: 02/21/20  7:49 AM  Result Value Ref Range   Glucose-Capillary 73 70 - 99 mg/dL    Comment: Glucose reference range applies only to samples taken after fasting for at least 8 hours.  CBC with Differential/Platelet     Status: Abnormal   Collection Time: 02/21/20  8:14 AM  Result Value Ref Range   WBC 7.4 4.0 - 10.5 K/uL   RBC 3.68 (L) 4.22 - 5.81 MIL/uL   Hemoglobin 10.4 (L) 13.0 - 17.0 g/dL   HCT 32.6 (L) 39 - 52 %   MCV 88.6 80.0 - 100.0 fL   MCH 28.3 26.0 - 34.0 pg   MCHC 31.9 30.0 - 36.0 g/dL   RDW 15.8 (H) 11.5 - 15.5 %   Platelets 296 150 - 400 K/uL   nRBC 0.0 0.0 - 0.2 %   Neutrophils Relative % 74 %   Neutro Abs 5.5 1.7 - 7.7 K/uL   Lymphocytes Relative 12 %   Lymphs Abs 0.9 0.7 - 4.0 K/uL   Monocytes Relative 10 %   Monocytes Absolute 0.7 0.1 - 1.0 K/uL   Eosinophils Relative 2 %   Eosinophils Absolute 0.2 0.0 - 0.5 K/uL   Basophils Relative 0 %   Basophils Absolute 0.0 0.0 - 0.1 K/uL   Immature Granulocytes 2 %   Abs Immature Granulocytes 0.11 (H) 0.00 - 0.07 K/uL    Comment: Performed at [facility], [address] [place], [place] [address]  Platelet inhibition p2y12 (Not at [facility])     Status: Abnormal   Collection Time: 02/21/20 11:38 AM  Result Value Ref Range   Platelet Function  P2Y12 133 (L) 182 - 335 PRU    Comment: (NOTE) The literature has shown a direct correlation of PRU values over 230 with higher risks of thrombotic events. Lower PRU values are associated with platelet inhibition. Performed at [facility], [place] [address], [place], [place] [address]   Glucose, capillary     Status: Abnormal   Collection Time: 02/21/20 11:53 AM  Result Value Ref Range   Glucose-Capillary 104 (H) 70 - 99  mg/dL    Comment: Glucose reference range applies only to samples taken after fasting for at least 8 hours.  Glucose, capillary     Status: Abnormal   Collection Time: 02/21/20  4:03 PM  Result Value Ref Range   Glucose-Capillary 103 (H) 70 - 99 mg/dL    Comment: Glucose reference range applies only  to samples taken after fasting for at least 8 hours.  Glucose, capillary     Status: Abnormal   Collection Time: 02/21/20  8:04 PM  Result Value Ref Range   Glucose-Capillary 108 (H) 70 - 99 mg/dL    Comment: Glucose reference range applies only to samples taken after fasting for at least 8 hours.  Glucose, capillary     Status: Abnormal   Collection Time: 02/22/20 12:06 AM  Result Value Ref Range   Glucose-Capillary 118 (H) 70 - 99 mg/dL    Comment: Glucose reference range applies only to samples taken after fasting for at least 8 hours.  Glucose, capillary     Status: Abnormal   Collection Time: 02/22/20  4:23 AM  Result Value Ref Range   Glucose-Capillary 101 (H) 70 - 99 mg/dL    Comment: Glucose reference range applies only to samples taken after fasting for at least 8 hours.  Phosphorus     Status: None   Collection Time: 02/22/20  8:04 AM  Result Value Ref Range   Phosphorus 2.8 2.5 - 4.6 mg/dL    Comment: Performed at [facility], [place] [address], [place], [place] [address]  Basic metabolic panel     Status: Abnormal   Collection Time: 02/22/20  8:04 AM  Result Value Ref Range   Sodium 136 135 - 145 mmol/L   Potassium 3.9 3.5 - 5.1 mmol/L   Chloride 97 (L) 98 - 111 mmol/L   CO2 27 22 - 32 mmol/L   Glucose, Bld 119 (H) 70 - 99 mg/dL    Comment: Glucose reference range applies only to samples taken after fasting for at least 8 hours.   BUN 15 8 - 23 mg/dL   Creatinine, Ser 1.38 (H) 0.61 - 1.24 mg/dL   Calcium 9.7 8.9 - 10.3 mg/dL   GFR, Estimated 51 (L) >60 mL/min   Anion gap 12 5 - 15    Comment: Performed at [place] [address] [place], [place]  [address]  Platelet inhibition p2y12 (Not at [facility])     Status: Abnormal   Collection Time: 02/22/20  8:04 AM  Result Value Ref Range   Platelet Function  P2Y12 177 (L) 182 - 335 PRU    Comment: (NOTE) The literature has shown a direct correlation of PRU values over 230 with higher risks of thrombotic events. Lower PRU values are associated with platelet inhibition. Performed at [facility], [place] [address] [place], [place] [address]   Glucose, capillary     Status: Abnormal   Collection Time: 02/22/20  8:20 AM  Result Value Ref Range   Glucose-Capillary 102 (H) 70 - 99 mg/dL    Comment: Glucose reference range applies only to samples taken after fasting for at least 8 hours.  CSF cell count with differential     Status: Abnormal   Collection Time: 02/22/20 11:37 AM  Result Value Ref Range   Tube # 3    Color, CSF COLORLESS COLORLESS   Appearance, CSF CLEAR (A) CLEAR   Supernatant NOT INDICATED    RBC Count, CSF 0 0 /cu mm   WBC, CSF 180 (HH) 0 - 5 /cu mm    Comment: CRITICAL RESULT CALLED TO, READ BACK BY AND VERIFIED WITH: SIAN WU RN [id number] BY A BENNETT    Segmented Neutrophils-CSF 58 (H) 0 - 6 %   Lymphs, CSF 42 40 - 80 %   Monocyte-Macrophage-Spinal Fluid 0 (L)  15 - 45 %   Eosinophils, CSF 0 0 - 1 %    Comment: Performed at [facility], [place] [address] [place], [place] [address]  Protein and glucose, CSF     Status: Abnormal   Collection Time: 02/22/20 11:47 AM  Result Value Ref Range   Glucose, CSF 21 (LL) 40 - 70 mg/dL    Comment: CRITICAL RESULT CALLED TO, READ BACK BY AND VERIFIED WITH: S WU RN [id number] K FORSYTH    Total  Protein, CSF 140 (H) 15 - 45 mg/dL    Comment: Performed at [place] [address] [place], [place] [address]  Glucose, capillary     Status: Abnormal   Collection Time: 02/22/20 11:52 AM  Result Value Ref Range   Glucose-Capillary 135 (H) 70 - 99 mg/dL    Comment: Glucose reference range applies only to samples  taken after fasting for at least 8 hours.  Cytology - Non PAP;     Status: None   Collection Time: 02/22/20 12:09 PM  Result Value Ref Range   CYTOLOGY - NON GYN      CYTOLOGY - NON PAP CASE: [id number] PATIENT: Howard Allen Non-Gynecological Cytology Report     Clinical History: None provided Specimen Submitted:  A. CEREBROSPINAL FLUID, SPINAL TAP:   FINAL MICROSCOPIC DIAGNOSIS: - No malignant cells identified  SPECIMEN ADEQUACY: Satisfactory for evaluation  DIAGNOSTIC COMMENTS: Dr. Gari Crown reviewed.  GROSS: Received is/are 2 ccs of colorless fluid (MW:kj) Smears: 0 Concentration Method (ThinPrep): 1 Cell Block: 0 Additional Studies: N/A     Final Diagnosis performed by Gillie Manners, MD.   Electronically signed 02/22/2020 Technical and / or Professional components performed at [employer]. [facility], [place] [address] [place], [place] [address].  Immunohistochemistry Technical component (if applicable) was performed at [facility] [address], [place], [place], [place] [address].   IMMUNOHISTOCHEMISTRY DISCLAIMER (if applicable): Some of these immunohistochemical stains may have  been developed and the performance characteristics determine by [facility]. Some may not have been cleared or approved by the U.S. Food and Drug Administration. The FDA has determined that such clearance or approval is not necessary. This test is used for clinical purposes. It should not be regarded as investigational or for research. This laboratory is certified under the [place] (CLIA-88) as qualified to perform high complexity clinical laboratory testing.  The controls stained appropriately.   Glucose, capillary     Status: Abnormal   Collection Time: 02/22/20  3:54 PM  Result Value Ref Range   Glucose-Capillary 107 (H) 70 - 99 mg/dL    Comment: Glucose reference range applies only to samples  taken after fasting for at least 8 hours.    CT CHEST W CONTRAST  Result Date: 02/20/2020 CLINICAL DATA:  Sepsis EXAM: CT CHEST WITH CONTRAST TECHNIQUE: Multidetector CT imaging of the chest was performed during intravenous contrast administration. CONTRAST:  39mL OMNIPAQUE IOHEXOL 300 MG/ML  SOLN COMPARISON:  Chest radiograph 02/18/2020 FINDINGS: Cardiovascular: Normal heart size. Trace pericardial fluid with some mild pericardial hazy stranding anteriorly towards the apex (3/113, 97) coronary artery calcifications are present. Central pulmonary arteries are normal caliber. The aortic root is suboptimally assessed given cardiac pulsation artifact. Atherosclerotic plaque within the normal caliber aorta. No acute luminal abnormality of the imaged aorta. No periaortic stranding or hemorrhage. Shared origin of the brachiocephalic and left common carotid arteries. Minimal plaque in the proximal great vessels  which are otherwise unremarkable. Central pulmonary arteries are normal caliber. No large central or lobar filling defects on this non tailored examination of the pulmonary arteries. No major venous abnormality. Mediastinum/Nodes: No mediastinal fluid or gas. Normal thyroid gland and thoracic inlet. No acute abnormality of the esophagus. Secretions seen in the trachea and central airways. No worrisome mediastinal, hilar or axillary adenopathy. Lungs/Pleura: Centrilobular emphysema changes present throughout the lungs. Scattered calcified granulomata bilaterally. Some more clustered branching nodules seen in the periphery of the anterior segment right upper lobe may be infectious or inflammatory or related to mucus impacted airways given more diffuse airways thickening and scattered secretions elsewhere. Trace left effusion. Some adjacent passive atelectasis with more dependent atelectatic changes both lung bases posteriorly. Some bandlike opacities likely reflect atelectasis or scarring. Upper Abdomen:  Gallbladder is decompressed. Some irregular thickening of the gallbladder wall with possible layering calcified biliary sand or small gallstones. Question some faint pericholecystic inflammation. Upper abdomen is otherwise unremarkable. Musculoskeletal: No chest wall abnormality. No acute or significant osseous findings. IMPRESSION: 1. Trace pericardial fluid with some mild pericardial hazy stranding anteriorly towards the apex, nonspecific but can be seen with pericarditis. 2. Scattered airways thickening and secretions with some more branching opacities in the periphery of the anterior segment right upper lobe, could reflect some mucous impaction or mild infection or inflammatory change. 3. Trace left effusion with adjacent passive atelectasis. 4. Gallbladder is decompressed with irregular thickening of the gallbladder wall with possible layering calcified biliary sand or small gallstones. Question some faint pericholecystic inflammation. If there is concern for acute cholecystitis, recommend further evaluation with right upper quadrant ultrasound. 5. Evidence of prior granulomatous disease within the chest with scattered calcified granulomata throughout the lungs. 6. Aortic Atherosclerosis (ICD10-I70.0) 7. Emphysema (ICD10-J43.9). Electronically Signed   By: Lovena Le M.D.   On: 02/20/2020 22:10    ROS:ROS  Blood pressure (!) 147/92, pulse 83, temperature 98.2 F (36.8 C), temperature source Oral, resp. rate 18, height 5\' 10"  (1.778 m), weight 61.3 kg, SpO2 100 %.  PHYSICAL EXAM: General appearance - Awake, able to answer questions appropriately, oriented to person.   Eyes - Approximately 4 x 2.5 cm organized right supraorbital hematoma extending from repaired laceration. Sutures intact, incision C/D/I, some dried blood noted. Edema and ecchymosis noted. Patient unable to open right eye secondary to edema, pupils equal and reactive, extraocular eye movements intact, patient reports normal vision from  both eyes. Ears - bilateral TM's and external ear canals normal Nose - normal and patent, no erythema, discharge or polyps Neck - No masses or lymphadenopathy palpated Respiratory - Normal respirations on room air  Studies Reviewed: None  Assessment/Plan: Camari Wisham is a 74 y/o M with history of seizures, fall following seizure with sustained right supraorbital laceration & repair at outside ED approximately 10 days ago.  ENT consulted to evaluate for hematoma formation following patient manipulation of his sutures.  He was previously anticoagulated on aspirin and Plavix, Plavix last given on 10/13. Presently on ASA, Heparin.  Examination demonstrates organized hematoma, focal edema causing patient to be unable to open his right eye. Visual fields and EOM intact. No surgical intervention at this time, patient still at risk for bleeding as Plavix given 2 days ago, no orbital signs, no evidence of infection. Advise application of cool compresses to the affected area every 2-4 hours, continue IV decadron as per primary. Hold anticoagulation as able.  Medical management as per primary team.  Please call [facility] with  any questions or concerns.    Dr. Franciso Bend [facility], [place] Throat Office [phone]

## 2020-02-22 NOTE — Progress Notes (Signed)
Subjective:  Patient more somnolent and area on his forehead seems to have become more edematous   Antibiotics:  Anti-infectives (From admission, onward)   Start     Dose/Rate Route Frequency Ordered Stop   02/20/20 1000  vancomycin (VANCOCIN) IVPB 1000 mg/200 mL premix  Status:  Discontinued        1,000 mg 200 mL/hr over 60 Minutes Intravenous Every 24 hours 02/20/20 0555 02/21/20 1259   02/19/20 1811  vancomycin variable dose per unstable renal function (pharmacist dosing)  Status:  Discontinued         Does not apply See admin instructions 02/19/20 1811 02/20/20 0555   02/18/20 0500  vancomycin (VANCOREADY) IVPB 750 mg/150 mL  Status:  Discontinued        750 mg 150 mL/hr over 60 Minutes Intravenous Every 12 hours 02/17/20 1651 02/19/20 1811   02/15/20 0300  vancomycin (VANCOREADY) IVPB 500 mg/100 mL        500 mg 100 mL/hr over 60 Minutes Intravenous Every 12 hours 02/14/20 1352 02/17/20 1755   02/14/20 1400  acyclovir (ZOVIRAX) 690 mg in dextrose 5 % 100 mL IVPB        10 mg/kg  68.9 kg 113.8 mL/hr over 60 Minutes Intravenous Every 12 hours 02/14/20 1245     02/14/20 1400  vancomycin (VANCOREADY) IVPB 1250 mg/250 mL        1,250 mg 166.7 mL/hr over 90 Minutes Intravenous STAT 02/14/20 1352 02/14/20 1655   02/14/20 1300  cefTRIAXone (ROCEPHIN) 2 g in sodium chloride 0.9 % 100 mL IVPB  Status:  Discontinued        2 g 200 mL/hr over 30 Minutes Intravenous Every 12 hours 02/14/20 1245 02/20/20 1732   02/14/20 1300  ampicillin (OMNIPEN) 2 g in sodium chloride 0.9 % 100 mL IVPB  Status:  Discontinued        2 g 300 mL/hr over 20 Minutes Intravenous Every 6 hours 02/14/20 1245 02/18/20 1051      Medications: Scheduled Meds: . amLODipine  10 mg Oral Daily  . aspirin  81 mg Oral Daily  . atorvastatin  80 mg Oral Daily  . Chlorhexidine Gluconate Cloth  6 each Topical Daily  . docusate  100 mg Oral BID  . fenofibrate  160 mg Oral Daily  . heparin  5,000 Units  Subcutaneous Q8H  . insulin aspart  0-9 Units Subcutaneous Q4H  . metoprolol succinate  100 mg Oral Daily  . multivitamin with minerals  1 tablet Oral Daily  . pantoprazole (PROTONIX) IV  40 mg Intravenous Daily  . polyethylene glycol  17 g Oral Daily  . potassium chloride  40 mEq Oral Daily  . vitamin B-12  1,000 mcg Oral Daily   Continuous Infusions: . sodium chloride 250 mL (02/21/20 2236)  . acyclovir 690 mg (02/22/20 1153)  . levETIRAcetam 1,000 mg (02/22/20 0435)   PRN Meds:.albuterol, docusate, labetalol, Resource ThickenUp Clear    Objective: Weight change:   Intake/Output Summary (Last 24 hours) at 02/22/2020 1430 Last data filed at 02/22/2020 1300 Gross per 24 hour  Intake 1120 ml  Output 900 ml  Net 220 ml   Blood pressure (!) 148/85, pulse 75, temperature 98.1 F (36.7 C), temperature source Oral, resp. rate 18, height 5\' 10"  (1.778 m), weight 61.3 kg, SpO2 100 %. Temp:  [97.6 F (36.4 C)-98.9 F (37.2 C)] 98.1 F (36.7 C) (10/15 1153) Pulse Rate:  [68-80] 75 (10/15 1153) Resp:  [  18] 18 (10/15 1153) BP: (143-163)/(80-94) 148/85 (10/15 1153) SpO2:  [99 %-100 %] 100 % (10/15 1153)  Physical Exam: General: Somnolent and with dressing over eyelid CVS regular rate, normal  Chest: , no wheezing, no respiratory distress Abdomen: soft non-distended,  Extremities: no edema or deformity noted bilaterally Skin: no rashes Neuro: nonfocal  CBC:    BMET Recent Labs    02/21/20 0432 02/22/20 0804  NA 131* 136  K 3.8 3.9  CL 98 97*  CO2 22 27  GLUCOSE 102* 119*  BUN 14 15  CREATININE 1.16 1.38*  CALCIUM 8.9 9.7     Liver Panel  No results for input(s): PROT, ALBUMIN, AST, ALT, ALKPHOS, BILITOT, BILIDIR, IBILI in the last 72 hours.     Sedimentation Rate No results for input(s): ESRSEDRATE in the last 72 hours. C-Reactive Protein No results for input(s): CRP in the last 72 hours.  Micro Results: Recent Results (from the past 720 hour(s))    Respiratory Panel by RT PCR (Flu A&B, Covid) - Nasopharyngeal Swab     Status: None   Collection Time: 02/13/20  4:17 PM   Specimen: Nasopharyngeal Swab  Result Value Ref Range Status   SARS Coronavirus 2 by RT PCR NEGATIVE NEGATIVE Final    Comment: (NOTE) SARS-CoV-2 target nucleic acids are NOT DETECTED.  The SARS-CoV-2 RNA is generally detectable in upper respiratoy specimens during the acute phase of infection. The lowest concentration of SARS-CoV-2 viral copies this assay can detect is 131 copies/mL. A negative result does not preclude SARS-Cov-2 infection and should not be used as the sole basis for treatment or other patient management decisions. A negative result may occur with  improper specimen collection/handling, submission of specimen other than nasopharyngeal swab, presence of viral mutation(s) within the areas targeted by this assay, and inadequate number of viral copies (<131 copies/mL). A negative result must be combined with clinical observations, patient history, and epidemiological information. The expected result is Negative.  Fact Sheet for Patients:  PinkCheek.be  Fact Sheet for Healthcare Providers:  GravelBags.it  This test is no t yet approved or cleared by the [place] FDA and  has been authorized for detection and/or diagnosis of SARS-CoV-2 by FDA under an Emergency Use Authorization (EUA). This EUA will remain  in effect (meaning this test can be used) for the duration of the COVID-19 declaration under Section 564(b)(1) of the Act, 21 U.S.C. section 360bbb-3(b)(1), unless the authorization is terminated or revoked sooner.     Influenza A by PCR NEGATIVE NEGATIVE Final   Influenza B by PCR NEGATIVE NEGATIVE Final    Comment: (NOTE) The Xpert Xpress SARS-CoV-2/FLU/RSV assay is intended as an aid in  the diagnosis of influenza from Nasopharyngeal swab specimens and  should not be used as  a sole basis for treatment. Nasal washings and  aspirates are unacceptable for Xpert Xpress SARS-CoV-2/FLU/RSV  testing.  Fact Sheet for Patients: PinkCheek.be  Fact Sheet for Healthcare Providers: GravelBags.it  This test is not yet approved or cleared by the [place] FDA and  has been authorized for detection and/or diagnosis of SARS-CoV-2 by  FDA under an Emergency Use Authorization (EUA). This EUA will remain  in effect (meaning this test can be used) for the duration of the  Covid-19 declaration under Section 564(b)(1) of the Act, 21  U.S.C. section 360bbb-3(b)(1), unless the authorization is  terminated or revoked. Performed at [facility], [place] [address], [place], [place] [address]   Culture, blood (Routine  X 2) w Reflex to ID Panel     Status: None   Collection Time: 02/13/20  4:57 PM   Specimen: BLOOD LEFT FOREARM  Result Value Ref Range Status   Specimen Description BLOOD LEFT FOREARM  Final   Special Requests   Final    BOTTLES DRAWN AEROBIC AND ANAEROBIC Blood Culture results may not be optimal due to an excessive volume of blood received in culture bottles   Culture   Final    NO GROWTH 5 DAYS Performed at [facility], [place] [address], [place], [place] [address]    Report Status 02/18/2020 FINAL  Final  Culture, blood (Routine X 2) w Reflex to ID Panel     Status: None   Collection Time: 02/13/20  5:02 PM   Specimen: BLOOD LEFT WRIST  Result Value Ref Range Status   Specimen Description BLOOD LEFT WRIST  Final   Special Requests   Final    BOTTLES DRAWN AEROBIC AND ANAEROBIC Blood Culture results may not be optimal due to an excessive volume of blood received in culture bottles   Culture   Final    NO GROWTH 5 DAYS Performed at [facility], [place] [address] [place], [place] [address]    Report Status 02/18/2020 FINAL  Final  MRSA PCR Screening     Status: Abnormal   Collection  Time: 02/14/20  3:03 AM   Specimen: Nasal Mucosa; Nasopharyngeal  Result Value Ref Range Status   MRSA by PCR POSITIVE (A) NEGATIVE Final    Comment:        The GeneXpert MRSA Assay (FDA approved for NASAL specimens only), is one component of a comprehensive MRSA colonization surveillance program. It is not intended to diagnose MRSA infection nor to guide or monitor treatment for MRSA infections. RESULT CALLED TO, READ BACK BY AND VERIFIED WITH: Lillie Fragmin RN 8:25 02/14/20 ([email or web address]) Performed at [facility], [place] [address] [place], [place] [address]   CSF culture     Status: None   Collection Time: 02/14/20 11:01 AM   Specimen: CSF; Cerebrospinal Fluid  Result Value Ref Range Status   Specimen Description CSF  Final   Special Requests NONE  Final   Gram Stain   Final    WBC PRESENT,BOTH PMN AND MONONUCLEAR NO ORGANISMS SEEN CYTOSPIN SMEAR    Culture   Final    NO GROWTH 3 DAYS Performed at [facility], [place] [address], [place], [place] [address]    Report Status 02/17/2020 FINAL  Final  Acid Fast Smear (AFB)     Status: None   Collection Time: 02/17/20  1:11 PM   Specimen: PATH Cytology CSF  Result Value Ref Range Status   AFB Specimen Processing Concentration  Final   Acid Fast Smear Negative  Final    Comment: (NOTE) Performed At: [facility] [place], [place] [id number] Rush Farmer MD WN:[id number]    Source (AFB) CSF  Final    Comment: Performed at [facility], [place] [address] [place],  [address]  Culture, fungus without smear     Status: None (Preliminary result)   Collection Time: 02/17/20  1:14 PM   Specimen: CSF; Cerebrospinal Fluid  Result Value Ref Range Status   Specimen Description CSF  Final   Special Requests NONE  Final   Culture   Final    NO GROWTH 3 DAYS Performed at [facility], [place]  [address], [place], [place] [address]    Report Status PENDING  Incomplete     Studies/Results: CT CHEST W CONTRAST  Result Date: 02/20/2020 CLINICAL DATA:  Sepsis EXAM: CT CHEST WITH CONTRAST TECHNIQUE: Multidetector CT imaging of the chest was performed during intravenous contrast administration. CONTRAST:  4mL OMNIPAQUE IOHEXOL 300 MG/ML  SOLN COMPARISON:  Chest radiograph 02/18/2020 FINDINGS: Cardiovascular: Normal heart size. Trace pericardial fluid with some mild pericardial hazy stranding anteriorly towards the apex (3/113, 97) coronary artery calcifications are present. Central pulmonary arteries are normal caliber. The aortic root is suboptimally assessed given cardiac pulsation artifact. Atherosclerotic plaque within the normal caliber aorta. No acute luminal abnormality of the imaged aorta. No periaortic stranding or hemorrhage. Shared origin of the brachiocephalic and left common carotid arteries. Minimal plaque in the proximal great vessels which are otherwise unremarkable. Central pulmonary arteries are normal caliber. No large central or lobar filling defects on this non tailored examination of the pulmonary arteries. No major venous abnormality. Mediastinum/Nodes: No mediastinal fluid or gas. Normal thyroid gland and thoracic inlet. No acute abnormality of the esophagus. Secretions seen in the trachea and central airways. No worrisome mediastinal, hilar or axillary adenopathy. Lungs/Pleura: Centrilobular emphysema changes present throughout the lungs. Scattered calcified granulomata bilaterally. Some more clustered branching nodules seen in the periphery of the anterior segment right upper lobe may be infectious or inflammatory or related to mucus impacted airways given more diffuse airways thickening and scattered secretions elsewhere. Trace left effusion. Some adjacent passive atelectasis with more dependent atelectatic changes both lung bases posteriorly. Some bandlike opacities likely reflect atelectasis or scarring. Upper Abdomen: Gallbladder is decompressed.  Some irregular thickening of the gallbladder wall with possible layering calcified biliary sand or small gallstones. Question some faint pericholecystic inflammation. Upper abdomen is otherwise unremarkable. Musculoskeletal: No chest wall abnormality. No acute or significant osseous findings. IMPRESSION: 1. Trace pericardial fluid with some mild pericardial hazy stranding anteriorly towards the apex, nonspecific but can be seen with pericarditis. 2. Scattered airways thickening and secretions with some more branching opacities in the periphery of the anterior segment right upper lobe, could reflect some mucous impaction or mild infection or inflammatory change. 3. Trace left effusion with adjacent passive atelectasis. 4. Gallbladder is decompressed with irregular thickening of the gallbladder wall with possible layering calcified biliary sand or small gallstones. Question some faint pericholecystic inflammation. If there is concern for acute cholecystitis, recommend further evaluation with right upper quadrant ultrasound. 5. Evidence of prior granulomatous disease within the chest with scattered calcified granulomata throughout the lungs. 6. Aortic Atherosclerosis (ICD10-I70.0) 7. Emphysema (ICD10-J43.9). Electronically Signed   By: Lovena Le M.D.   On: 02/20/2020 22:10      Assessment/Plan:  INTERVAL HISTORY: Patient is now status post repeat lumbar puncture   Principal Problem:   Aseptic meningitis Active Problems:   Seizure ([place])   Weight loss    Howard Allen is a 75 y.o. male with  Hx of weight loss, treatment for Crohn's disease admitted with seizures and aseptic meninigoencephalitis  #1 Aseptic meningoencephalitis  Has had large-volume lumbar puncture and I have been down to pathology and microbiology to make sure that all the appropriate tests are being done.  When can get LP would get large volume and fill at least all 4 tubes with   CSF white cells are still in the 100s but  protein and glucose is still pending  We are sending CSF for cytology with flow cytometry (path)  CSF HSV PCR CSF VZV  PCR  Take dedicated 8-10 ml of CSF centifuget for AFB culture,  Take a dedicated 8 to 10 cc of CSfor fungal culture and save supernatant  CSF for MTB PCR  Send CSF for Coccidioides antibiotic bodies, CSF Blastomyces antigen CSF histoplasma antigen, MTB PCR CSF for paraneoplastic panel per Neurology  Start amphotericin for coverage of dimorphic fungi and 4 drug therapy for CNS TB + steroids  Continuing acyclovir for now  I spent greater than 40 minutes with the patient including greater than 50% of time in face to face counsel of the patient and in coordination of their care with Dr. Hortense Ramal, Pathology, Microbiology and Pharmacy  Dr. Juleen China to check in on the patient over the weekend.     LOS: 9 days   Alcide Evener 02/22/2020, 2:30 PM

## 2020-02-22 NOTE — Progress Notes (Signed)
  Speech Language Pathology Treatment: Dysphagia;Cognitive-Linquistic  Patient Details Name: Howard Allen MRN: [id number] DOB: 01/12/1946 Today's Date: 02/22/2020 Time: 6301-6010 SLP Time Calculation (min) (ACUTE ONLY): 22 min  Assessment / Plan / Recommendation Clinical Impression  Pt was seen for swallowing and cognitive tx, okay to sit up in bed s/p LP this am per RN. Pt is quite drowsy this morning but will arouse for PO trials. Mod cues were needed for sustained attention throughout session, as well as for increased volume to help with intelligibility of speech. He has difficulty obtaining thickened liquids from the straw, getting it into his mouth best via spoon. Again he had a single instance of wet vocal quality followed immediately with a throat clear that cleared it, but frequency of these symptoms remains very low. Min-Mod cues were provided to facilitate oral holding. SLP also provided cues for effortful swallows throughout PO trials and with subsequent dry swallows to target pharyngeal musculature. Also attempted to introduce CTAR, but despite Max cues, pt cognitively was not able to complete more than 1 repetition.    HPI HPI: 74 y.o. 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        SLP Plan  Continue with current plan of care       Recommendations  Diet recommendations: Dysphagia 2 (fine chop);Honey-thick liquid Liquids provided via: Cup;Straw Medication Administration: Crushed with puree Supervision: Patient able to self feed;Full supervision/cueing for compensatory strategies Compensations: Slow rate;Small sips/bites;Lingual sweep for clearance of  pocketing;Clear throat intermittently Postural Changes and/or Swallow Maneuvers: Seated upright 90 degrees                Oral Care Recommendations: Oral care BID Follow up Recommendations: Skilled Nursing facility SLP Visit Diagnosis: Cognitive communication deficit (R41.841);Dysarthria and anarthria (R47.1);Dysphagia, oropharyngeal phase (R13.12) Plan: Continue with current plan of care       GO                Osie Bond., M.A. [facility] Pager [phone] Office [phone]  02/22/2020, 12:33 PM

## 2020-02-22 NOTE — Progress Notes (Addendum)
Pharmacy Antibiotic Note  Howard Allen is a 74 y.o. 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 Repeat CSF studies pending from today. So far no organism has been identified as a cause.  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 Current SCr is up slightly to 1.38. Will provide pre and post hydration of amphotericin B with 500 mL NS. Potassium today is within normal limits at 3.9 and Mg on 10/13 was within normal limits at 2.0.  Plan: Amphotericin B 310 mg (~ 5 mg/kg) every 24 hours  Give 500 mL of normal saline 1 hour prior to dose Flush line with dextrose in-between normal saline and amphotericin B doses Give another 500 mL normal saline after amphotericin B infusion is complete  Administration instructions reviewed with RN Daily K and Mg- Pharmacy to replace    Rifampin 600 mg daily Isoniazid 300 mg daily Pyridoxine 50 mg daily Pyrazinamide 1500 mg daily Ethambutol 1200 mg daily Dexamethasone IV 0.3 mg/kg (~18 mg) daily for 2 weeks- Will taper as appropriate pending results of CSF  Baseline CMET ordered for tomorrow AM   Height: 5\' 10"  (177.8 cm) Weight: 61.3 kg (135 lb 2.3 oz) IBW/kg (Calculated) : 73  Temp (24hrs), Avg:98.3 F (36.8 C), Min:97.6 F (36.4 C), Max:98.9 F (37.2 C)  Recent Labs  Lab 02/16/20 0729 02/16/20 0729 02/17/20 0329 02/17/20 0329 02/17/20 1530 02/18/20 0419 02/18/20 0419 02/19/20 0641 02/19/20 1645 02/19/20 1813 02/20/20 0449 02/21/20 0432 02/21/20 0814 02/22/20 0804  WBC 9.4  --  7.6  --   --  7.9  --   --   --   --   --   --  7.4  --   CREATININE 1.23   < > 1.18   < >  --  1.18   < > 1.27*  --  1.29* 1.20 1.16  --  1.38*  VANCOTROUGH  --   --   --   --  13*  --   --   --  26*  --   --   --   --   --   VANCORANDOM  --   --   --   --   --   --   --   --   --   --  16  --   --   --    < > = values in this interval not displayed.    Estimated  Creatinine Clearance: 42 mL/min (A) (by C-G formula based on SCr of 1.38 mg/dL (H)).    Allergies  Allergen Reactions  . Lisinopril Anaphylaxis, Cough and Other (See Comments)    Other reaction(s): anaphylaxis Pt states it made him "cough his head off"   . Lorazepam     Other reaction(s): Hallucinations      Thank you for allowing pharmacy to be a part of this patient's care.  Jimmy Footman, PharmD, BCPS, BCIDP Infectious Diseases Clinical Pharmacist Phone: [phone] 02/22/2020 3:06 PM

## 2020-02-22 NOTE — Procedures (Signed)
LUMBAR PUNCTURE (SPINAL TAP) PROCEDURE NOTE  Indication: Meningitis   Proceduralists: Dr Zeb Comfort, Etta Quill, PA  Time of procedure: 02/22/2020 1115   Risks of the procedure were dicussed with the patient including post-LP headache, bleeding, infection, weakness/numbness of legs(radiculopathy), death.    Consent obtained from: Daughter [name]   Procedure Note The patient was prepped and draped, and using sterile technique a 20 gauge quinke spinal needle was inserted in the L4-5 space.   Opening pressure was not obatined Approximately 31 cc of CSF were obtained and sent for analysis.  Patient tolerated the procedure well and blood loss was minimal.   Howard Allen Barbra Sarks

## 2020-02-22 NOTE — Progress Notes (Signed)
Subjective: No acute events overnight.  Denies any headache, pain.  ROS: Unable to obtain due to poor mental status  Examination  Vital signs in last 24 hours: Temp:  [97.6 F (36.4 C)-99.2 F (37.3 C)] 98.6 F (37 C) (10/15 0830) Pulse Rate:  [68-80] 77 (10/15 0830) Resp:  [18] 18 (10/15 0830) BP: (140-163)/(80-94) 161/94 (10/15 0830) SpO2:  [99 %-100 %] 100 % (10/15 0830)  General: lying in bed,not in apparent distress CVS: pulse-normal rate and rhythm RS: breathing comfortably,coarse bilaterally Extremities: normal,warm Neuro: Awake, alert oriented to self, not to place (thinks he is in [place] again today which is where he is originally from), follows simple one-step commands, was able to name objects however intermittently did perseverate, also dysarthric speech, 4/5 in all 4 extremities  Basic Metabolic Panel: Recent Labs  Lab 02/16/20 0729 02/16/20 1116 02/17/20 0329 02/17/20 0329 02/18/20 0419 02/18/20 0419 02/19/20 0641 02/19/20 0641 02/19/20 1813 02/20/20 0449 02/21/20 0432 02/22/20 0804  NA 140   < > 141   < > 137  --  135  --   --  134* 131* 136  K 3.8   < > 3.8   < > 4.2  --  3.8  --   --  3.4* 3.8 3.9  CL 105   < > 107   < > 103  --  96*  --   --  99 98 97*  CO2 24   < > 24   < > 19*  --  24  --   --  24 22 27   GLUCOSE 128*   < > 106*   < > 108*  --  93  --   --  97 102* 119*  BUN 14   < > 18   < > 20  --  18  --   --  15 14 15   CREATININE 1.23   < > 1.18   < > 1.18   < > 1.27*  --  1.29* 1.20 1.16 1.38*  CALCIUM 8.7*   < > 9.1   < > 9.0   < > 9.4   < >  --  8.8* 8.9 9.7  MG 1.7  --  1.9  --  2.2  --  1.6*  --   --  2.0  --   --   PHOS 2.8   < > 2.8   < > 2.7  --  1.8*  --   --  3.0 2.8 2.8   < > = values in this interval not displayed.    CBC: Recent Labs  Lab 02/16/20 0729 02/16/20 1116 02/17/20 0329 02/18/20 0419 02/21/20 0814  WBC 9.4  --  7.6 7.9 7.4  NEUTROABS  --   --   --   --  5.5  HGB 8.5* 8.5* 8.9* 9.5* 10.4*  HCT 26.7* 25.0*  28.3* 29.4* 32.6*  MCV 88.7  --  88.7 87.0 88.6  PLT 289  --  303 217 296     Coagulation Studies: No results for input(s): LABPROT, INR in the last 72 hours.  Imaging No new brain imaging overnight  ASSESSMENT AND PLAN: 74 year old male who presented with new onset status epilepticus and was found to have meningitis.  New onset status epilepticus (resolved) Meningitis Acute encephalopathy, infectious -LP showed 140 WBCs with 64% neutrophils and 32% lymphocytes, protein 112 and glucose less than 20.Seizures can occasionally cause pleocytosis. However patient was borderline febrile (temperature 100.3 F) and had behavioral  changes preceding the seizure for a few days which along with CSF findings is very concerning for meningitis.HSV IgG was elevated at 2.02 but PCR came back negative. VDRL is negative, cryptococcal antigen is negative. QuantiFERON gold was ordered by ICU attending due to CSF findings in the setting of recent immunosuppression which was initially indeterminate and later negative. Arbovirus panel is pending as well as AFB smear and culture.  Recommendations - P2 Y 12 today was 177 (normal 182 and higher).  As the number is improving, we discussed the risks and benefits of waiting versus proceeding with lumbar puncture with daughter on phone and she agreed to proceed with a spinal tap. -Lumbar puncture was performed and 31 cc of CSF was collected and sent to lab for tests as noted in ID notes.  Also added paraneoplastic panel to be sent to [facility] if CSF is left over. -Informed RN to check a lumbar puncture site in an hour to make sure patient is not bleeding given recent use of Plavix. - Continue Keppra 1000 mg twice daily - MRI brain on 02/14/2020 showed signal abnormality in the left thalamus concerning for acute infarct versus seizure related. Will likely need MRI brain without contrast in 8 to 12 weeksto see if MRI changes resolved which would make it more  likely that these were seizure related. -Continue seizure precautions  -as needed IV Ativan 2 mg for clinical seizure-like activity  I have spent a total of41minuteswith the patient reviewing hospitalnotes, test results, labs and examining the patient as well as establishing an assessment and plan that was discussed personally with the patient'sphysician Dr. Doristine Bosworth, Dr. Tommy Medal and patient's daughter.>50% of time was spent in direct patient care.    [facility] For questions after 5pm please refer to AMION to reach the Neurologist on call

## 2020-02-22 NOTE — TOC Progression Note (Signed)
Transition of Care [facility]) - Progression Note    Patient Details  Name: Howard Allen MRN: [id number] Date of Birth: [birth year]  Transition of Care [facility]) CM/SW [place], [place] Phone Number: 02/22/2020, 10:55 AM  Clinical Narrative:   CSW continuing to follow for discharge plans. Patient not medically stable at this time.    Expected Discharge Plan: IP Rehab Facility Barriers to Discharge: Continued Medical Work up  Expected Discharge Plan and Services Expected Discharge Plan: [place]     Post Acute Care Choice: IP Rehab Living arrangements for the past 2 months: Single Family Home                                       Social Determinants of Health (SDOH) Interventions    Readmission Risk Interventions No flowsheet data found.

## 2020-02-22 NOTE — Progress Notes (Signed)
RN from night shift reported that patient had scratched off a scab on the laceration wound Rt upper eyelid with stitches.  She said it was bleeding around 0600.  She put a dressing on the wound. On assessment this morning, dressing on the wound is minimal, there is a 5cm diameter hematoma at the supraorbital area.  Will notified Dr.  Doristine Bosworth and monitor closely.

## 2020-02-23 DIAGNOSIS — G0490 Encephalitis and encephalomyelitis, unspecified: Secondary | ICD-10-CM | POA: Diagnosis not present

## 2020-02-23 LAB — HSV 1/2 PCR, CSF
HSV-1 DNA: NEGATIVE
HSV-2 DNA: NEGATIVE

## 2020-02-23 LAB — COMPREHENSIVE METABOLIC PANEL
ALT: 18 U/L (ref 0–44)
AST: 22 U/L (ref 15–41)
Albumin: 3.2 g/dL — ABNORMAL LOW (ref 3.5–5.0)
Alkaline Phosphatase: 36 U/L — ABNORMAL LOW (ref 38–126)
Anion gap: 12 (ref 5–15)
BUN: 19 mg/dL (ref 8–23)
CO2: 20 mmol/L — ABNORMAL LOW (ref 22–32)
Calcium: 9.3 mg/dL (ref 8.9–10.3)
Chloride: 103 mmol/L (ref 98–111)
Creatinine, Ser: 1.24 mg/dL (ref 0.61–1.24)
GFR, Estimated: 58 mL/min — ABNORMAL LOW (ref >60)
Glucose, Bld: 106 mg/dL — ABNORMAL HIGH (ref 70–99)
Potassium: 4.2 mmol/L (ref 3.5–5.1)
Sodium: 135 mmol/L (ref 135–145)
Total Bilirubin: 0.5 mg/dL (ref 0.3–1.2)
Total Protein: 5.9 g/dL — ABNORMAL LOW (ref 6.5–8.1)

## 2020-02-23 LAB — GLUCOSE, CAPILLARY
Glucose-Capillary: 102 mg/dL — ABNORMAL HIGH (ref 70–99)
Glucose-Capillary: 111 mg/dL — ABNORMAL HIGH (ref 70–99)
Glucose-Capillary: 123 mg/dL — ABNORMAL HIGH (ref 70–99)
Glucose-Capillary: 130 mg/dL — ABNORMAL HIGH (ref 70–99)
Glucose-Capillary: 90 mg/dL (ref 70–99)
Glucose-Capillary: 94 mg/dL (ref 70–99)

## 2020-02-23 LAB — MISC LABCORP TEST (SEND OUT): Labcorp test code: 183506

## 2020-02-23 LAB — PHOSPHORUS: Phosphorus: 3.4 mg/dL (ref 2.5–4.6)

## 2020-02-23 LAB — ACID FAST SMEAR (AFB, MYCOBACTERIA): Acid Fast Smear: NEGATIVE

## 2020-02-23 LAB — MAGNESIUM: Magnesium: 1.5 mg/dL — ABNORMAL LOW (ref 1.7–2.4)

## 2020-02-23 MED ORDER — PANTOPRAZOLE SODIUM 40 MG PO PACK
40.0000 mg | PACK | Freq: Every day | ORAL | Status: DC
  Administered 2020-02-24 – 2020-03-17 (×23): 40 mg via ORAL
  Filled 2020-02-23 (×23): qty 20

## 2020-02-23 MED ORDER — SODIUM CHLORIDE 0.9 % IV SOLN
INTRAVENOUS | Status: DC | PRN
  Administered 2020-02-23 – 2020-02-24 (×2): 250 mL via INTRAVENOUS
  Administered 2020-03-14: 1000 mL via INTRAVENOUS

## 2020-02-23 MED ORDER — MAGNESIUM SULFATE 2 GM/50ML IV SOLN
2.0000 g | Freq: Once | INTRAVENOUS | Status: AC
  Administered 2020-02-23: 2 g via INTRAVENOUS
  Filled 2020-02-23: qty 50

## 2020-02-23 MED ORDER — LEVETIRACETAM 500 MG PO TABS
1000.0000 mg | ORAL_TABLET | Freq: Two times a day (BID) | ORAL | Status: DC
  Administered 2020-02-23 – 2020-03-17 (×47): 1000 mg via ORAL
  Filled 2020-02-23 (×46): qty 2

## 2020-02-23 NOTE — Progress Notes (Addendum)
PROGRESS NOTE    Howard Allen  AOZ:[id number] DOB: 07-17-1945 DOA: 02/13/2020 PCP: System, Provider Not In   Brief Narrative: The patient is a 74 year old Caucasian male with a past medical history significant for Crohn's disease, hypertension, hyperlipidemia, depression history of hospitalization in June 2021 for pneumatosis status post ileocolonic resection and lysis of adhesions who presented with multiple tonic-clonic seizures complicated by status epilepticus that required intubation and loading with Keppra. CT of the head was negative and initially when he was brought in to the ED by EMS there was at least 3 witnessed generalized tonic-clonic seizures noted. PCCM was consulted for admission given that he was intubated on arrival and unresponsive. He was intubated on 02/13/2020 and extubated 02/15/2020. Neurology was consulted and he underwent further work-up and was found to have a signal abnormality in the left limb is most consistent with an acute infarct. Echocardiogram done showed an EF of 55 to 60% with moderate LVH and grade 1 diastolic dysfunction. EEG testing on 10/9 was negative. 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 He underwent several viral serologies and had a lumbar puncture sent on 02/14/2020. He was started on IV vancomycin, IV ceftriaxone, IV ampicillin and IV acyclovir for suspected meningitis. CSF analysis was consistent with aseptic meningitis with high white blood cells and very low glucose level. CSF culture was negative for anything and everything. He was transferred to [facility] service on 02/19/2020 and remained confused. Subsequently ID was consulted and they were suspecting aseptic meningoencephalitis and recommended large volume LP. Patient was already on aspirin and Plavix for past 5 days. His Plavix was stopped on 02/20/2020. P2 Y 12 was checked and based on that, neurology performed another large-volume LP on 02/22/2020 and several labs were  sent by ID. 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   Assessment & Plan:   Principal Problem:   Aseptic meningitis Active Problems:   Seizure ([place])   Weight loss   Encounter for nasogastric (NG) tube placement  Aseptic meningitis  - LP performed on 10/7 consistent with meningitis - Cultures are NGTD at 3 Days; HSV with positive IgG... pcr negative. - VDLR and cryptococcal antigen negative., and [place]ile serology pending - Quantiferon gold was indeterminate.  Patient was on vancomycin, ceftriaxone and acyclovir.  Consulted ID on 02/20/2020.  Dr. Tommy Medal recommended repeat large-volume LP to send more labs.  Unfortunately, patient was already on Plavix which I discontinued on 02/20/2020.  Rocephin and vancomycin discontinued but acyclovir continues.  Had a long multidisciplinary discussion between me, Dr. Tommy Medal and Dr. Hortense Ramal of neurology on 02/21/2020.  Checked P2 Y12 however it was too low.  Not safe to do LP according to neurology.  Neurology to assess timing of LP.  Defer to ID about starting empiric antibiotics or waiting until LP done.  Plavix discontinued on 02/20/2020. Patient finally underwent large-volume LP by neurology on 02/22/2020. 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 Patient remains confused.  Large hematoma above right eye: He developed large hematoma on the morning of 02/22/2020 after he is continued to pluck his right forehead laceration. Due to the size, he was unable to open his eye but there was no pressure on the eyeball. For this reason, I had consulted ENT and he was evaluated by them but  they did not feel the need to evacuate hematoma. He is able to open about 30% of the right eye today.  Acute Ischemic Infarct  -Seen on MRI imaging on 10/7: left thalamic signal  abnormality measuring 2.3 cm most consistent with acute infarct; no edema or hemorrhagic conversion. 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 2 mm left paraophthalmic ICA aneurysm. Widely patent cervical carotid and vertebral arteries. Right upper lobe pulmonary nodules measuring up to 5 mm. Aortic Atherosclerosis (ICD10-I70.0) and Emphysema." -  now on atorvastatin 80 mg despite that he takes 20 at home given his acute CVA - TTE negative for vegetations or thrombosis.  - Continue high intensity statin; Lipid panel done and showed a total cholesterol/HDL ratio 3.3, cholesterol level 154, HDL level 46, LDL 79, triglycerides 146, VLDL 29 -PT OT recommending CIR next-appreciate neurology further evaluation recommendations.  We will continue aspirin but discontinued Plavix for the reasons mentioned above. Will defer to neurology for the timing of resumption of his Plavix.  Tonic-Clonic Seizures Status Epilepticus: Resolved - Secondary to meningitis  - Neurology on board; appreciate their recommendations He remains on IV Keppra. He is on dysphagia 2 diet now. We will switch to oral Keppra.  Hypomagnesemia Resolved.  Hypophosphatemia Resolved.  B12 Deficiency  Moderate Malnutrition - Likely due to malnutrition and anorexia, possibly worsened by malabsorption given bowel inflammation seen on imaging the past several months prior to resection. Of note, 70 lb weight loss in the past 10 months.  - Consult to dietician for tube feed initiation, he was started on tube feedings which was discontinued now that he is tolerating dysphagia 2 diet. Nutritionist now recommending hormonal shake twice daily as well as Magic cup twice daily with meals and multivitamin with minerals daily.  HTN/HLD -Home  medications include Toprol 100 mg, Amlodipine 5 mg, Lipitor 20 mg, Fenofibrate 160 mg.  Blood pressure still slightly elevated at times but still reasonable.  Will continue amlodipine to 10 mg and Toprol-XL 100 mg p.o. daily.   Normocytic Anemia  Hx of Iron Deficiency Anemia B12 Deficiency  - B12 levels low. Iron panel shows low saturation, however normal iron and TIBC, unclear picture as not consistent completely with iron deficiency anemia or anemia of chronic disease.  Hemoglobin is stable.  ? Crohn's Disease s/p Ileocolic Resection  - No longer on immunosuppressive therapy. -Takes budesonide as an outpatient but this has not been resumed yet.  AKI on CKD stage II Back to normal.  DVT prophylaxis: SCDs Heparin 5000 units subcu every 8 hours Code Status: FULL CODE  Family Communication: No family present at bedside.  Daughter Threasa Beards updated yesterday Disposition Plan: Pending further clinical improvement back to baseline and clearance by neurology prior to safe discharge disposition  Status is: Inpatient  Remains inpatient appropriate because:Altered mental status, Unsafe d/c plan, IV treatments appropriate due to intensity of illness or inability to take PO and Inpatient level of care appropriate due to severity of illness  Dispo: The patient is from: Home              Anticipated d/c is to: CIR              Anticipated d/c date is:> 3 days              Patient currently is not  medically stable to d/c.  Consultants:   Neurology  PCCM Transfer   Procedures:  10/6 >> ETT placed 10/7 >> LP 10/8 >> Extubated   Significant Diagnostic Tests:  10/6 CT Head >> No acute intracranial abnormalities; age-related volume loss and chronic small vessel disease 10/7 MRI brain: 1. Signal abnormality in the left thalamus most consistent with an acute infarct. 2. Signal abnormality in the mesial left temporal lobe and splenium of the corpus callosum which may reflect acute infarct,  sequelae of recent seizure activity, or encephalitis (including herpes encephalitis). No contralateral cerebral involvement. 3. Mild chronic small vessel ischemic disease. 10/8 echo: LVEF 55-60%, mod LVH, Grade I diastolic dysfunction 09/6 CT Head >> Progressive low density in the left thalamus related to acute infarction; no hemorrhage 10/9 EEG >> Negative  Micro Data:  10/6 COVID-19 + influenza >> Negative  10/6 Blood Cultures >> NGTD 10/7 CSF Culture >> NGTD 10/7 HSV serology >> 2.02 on igg testing. Will send pcr to determine past or active.  10/7 West Nile serology >>  10/7 VRDL serology >> 10/8 Quanitferon Gold  >> indeterminant 10/9 acid fast csf:  10/11 HSV PCR: pending   Antimicrobials:  Anti-infectives (From admission, onward)   Start     Dose/Rate Route Frequency Ordered Stop   02/23/20 0100  acyclovir (ZOVIRAX) 615 mg in dextrose 5 % 100 mL IVPB        10 mg/kg  61.3 kg 112.3 mL/hr over 60 Minutes Intravenous Every 12 hours 02/22/20 1537     02/22/20 2100  amphotericin B liposome (AMBISOME) 310 mg in dextrose 5 % 500 mL IVPB        5 mg/kg  61.3 kg 288.8 mL/hr over 120 Minutes Intravenous Every 24 hours 02/22/20 1524     02/22/20 1800  rifampin (RIFADIN) capsule 600 mg        600 mg Oral Daily 02/22/20 1520     02/22/20 1800  isoniazid (NYDRAZID) tablet 300 mg        300 mg Oral Daily 02/22/20 1520     02/22/20 1800  pyrazinamide tablet 1,500 mg        1,500 mg Oral Daily 02/22/20 1520     02/22/20 1800  ethambutol (MYAMBUTOL) tablet 1,200 mg        1,200 mg Oral Daily 02/22/20 1520     02/20/20 1000  vancomycin (VANCOCIN) IVPB 1000 mg/200 mL premix  Status:  Discontinued        1,000 mg 200 mL/hr over 60 Minutes Intravenous Every 24 hours 02/20/20 0555 02/21/20 1259   02/19/20 1811  vancomycin variable dose per unstable renal function (pharmacist dosing)  Status:  Discontinued         Does not apply See admin instructions 02/19/20 1811 02/20/20 0555    02/18/20 0500  vancomycin (VANCOREADY) IVPB 750 mg/150 mL  Status:  Discontinued        750 mg 150 mL/hr over 60 Minutes Intravenous Every 12 hours 02/17/20 1651 02/19/20 1811   02/15/20 0300  vancomycin (VANCOREADY) IVPB 500 mg/100 mL        500 mg 100 mL/hr over 60 Minutes Intravenous Every 12 hours 02/14/20 1352 02/17/20 1755   02/14/20 1400  acyclovir (ZOVIRAX) 690 mg in dextrose 5 % 100 mL IVPB  Status:  Discontinued        10 mg/kg  68.9 kg 113.8 mL/hr over 60 Minutes Intravenous Every 12 hours 02/14/20 1245 02/22/20 1537   02/14/20 1400  vancomycin (VANCOREADY)  IVPB 1250 mg/250 mL        1,250 mg 166.7 mL/hr over 90 Minutes Intravenous STAT 02/14/20 1352 02/14/20 1655   02/14/20 1300  cefTRIAXone (ROCEPHIN) 2 g in sodium chloride 0.9 % 100 mL IVPB  Status:  Discontinued        2 g 200 mL/hr over 30 Minutes Intravenous Every 12 hours 02/14/20 1245 02/20/20 1732   02/14/20 1300  ampicillin (OMNIPEN) 2 g in sodium chloride 0.9 % 100 mL IVPB  Status:  Discontinued        2 g 300 mL/hr over 20 Minutes Intravenous Every 6 hours 02/14/20 1245 02/18/20 1051       Subjective: Seen and examined. Patient initially sleepy but easily arousable. He is more oriented today. He is oriented to time/October and person. Still disoriented to place.  Objective: Vitals:   02/22/20 2119 02/22/20 2333 02/23/20 0400 02/23/20 0730  BP: (!) 146/81 140/78 (!) 146/87 (!) 148/86  Pulse: 72 72 67 66  Resp: 18 18 19 18   Temp: 98.3 F (36.8 C) 97.9 F (36.6 C) 98.2 F (36.8 C) 97.8 F (36.6 C)  TempSrc: Oral  Axillary Oral  SpO2: 98% 98% 100% 99%  Weight:      Height:        Intake/Output Summary (Last 24 hours) at 02/23/2020 1102 Last data filed at 02/23/2020 0900 Gross per 24 hour  Intake 1686.49 ml  Output --  Net 1686.49 ml   Filed Weights   02/19/20 0500 02/20/20 0211 02/21/20 0322  Weight: 61.1 kg 60.8 kg 61.3 kg   Examination: Physical Exam:  General exam: Appears calm and  comfortable, large hematoma right forehead Respiratory system: Clear to auscultation. Respiratory effort normal. Cardiovascular system: S1 & S2 heard, RRR. No JVD, murmurs, rubs, gallops or clicks. No pedal edema. Gastrointestinal system: Abdomen is nondistended, soft and nontender. No organomegaly or masses felt. Normal bowel sounds heard. Central nervous system: Alert and oriented x2. No focal neurological deficits. Extremities: Symmetric 5 x 5 power. Skin: No rashes, lesions or ulcers.   Data Reviewed: I have personally reviewed following labs and imaging studies  CBC: Recent Labs  Lab 02/16/20 1116 02/17/20 0329 02/18/20 0419 02/21/20 0814  WBC  --  7.6 7.9 7.4  NEUTROABS  --   --   --  5.5  HGB 8.5* 8.9* 9.5* 10.4*  HCT 25.0* 28.3* 29.4* 32.6*  MCV  --  88.7 87.0 88.6  PLT  --  303 217 294   Basic Metabolic Panel: Recent Labs  Lab 02/17/20 0329 02/17/20 0329 02/18/20 0419 02/18/20 0419 02/19/20 0641 02/19/20 0641 02/19/20 1813 02/20/20 0449 02/21/20 0432 02/22/20 0804 02/23/20 0341  NA 141   < > 137   < > 135  --   --  134* 131* 136 135  K 3.8   < > 4.2   < > 3.8  --   --  3.4* 3.8 3.9 4.2  CL 107   < > 103   < > 96*  --   --  99 98 97* 103  CO2 24   < > 19*   < > 24  --   --  24 22 27  20*  GLUCOSE 106*   < > 108*   < > 93  --   --  97 102* 119* 106*  BUN 18   < > 20   < > 18  --   --  15 14 15 19   CREATININE 1.18   < >  1.18   < > 1.27*   < > 1.29* 1.20 1.16 1.38* 1.24  CALCIUM 9.1   < > 9.0   < > 9.4  --   --  8.8* 8.9 9.7 9.3  MG 1.9  --  2.2  --  1.6*  --   --  2.0  --   --  1.5*  PHOS 2.8   < > 2.7   < > 1.8*  --   --  3.0 2.8 2.8 3.4   < > = values in this interval not displayed.   GFR: Estimated Creatinine Clearance: 46.7 mL/min (by C-G formula based on SCr of 1.24 mg/dL). Liver Function Tests: Recent Labs  Lab 02/23/20 0341  AST 22  ALT 18  ALKPHOS 36*  BILITOT 0.5  PROT 5.9*  ALBUMIN 3.2*   No results for input(s): LIPASE, AMYLASE in the  last 168 hours. No results for input(s): AMMONIA in the last 168 hours. Coagulation Profile: No results for input(s): INR, PROTIME in the last 168 hours. Cardiac Enzymes: No results for input(s): CKTOTAL, CKMB, CKMBINDEX, TROPONINI in the last 168 hours. BNP (last 3 results) No results for input(s): PROBNP in the last 8760 hours. HbA1C: No results for input(s): HGBA1C in the last 72 hours. CBG: Recent Labs  Lab 02/22/20 1554 02/22/20 2008 02/22/20 2355 02/23/20 0413 02/23/20 0727  GLUCAP 107* 128* 197* 94 90   Lipid Profile: No results for input(s): CHOL, HDL, LDLCALC, TRIG, CHOLHDL, LDLDIRECT in the last 72 hours. Thyroid Function Tests: No results for input(s): TSH, T4TOTAL, FREET4, T3FREE, THYROIDAB in the last 72 hours. Anemia Panel: No results for input(s): VITAMINB12, FOLATE, FERRITIN, TIBC, IRON, RETICCTPCT in the last 72 hours. Sepsis Labs: No results for input(s): PROCALCITON, LATICACIDVEN in the last 168 hours.  Recent Results (from the past 240 hour(s))  Respiratory Panel by RT PCR (Flu A&B, Covid) - Nasopharyngeal Swab     Status: None   Collection Time: 02/13/20  4:17 PM   Specimen: Nasopharyngeal Swab  Result Value Ref Range Status   SARS Coronavirus 2 by RT PCR NEGATIVE NEGATIVE Final    Comment: (NOTE) SARS-CoV-2 target nucleic acids are NOT DETECTED.  The SARS-CoV-2 RNA is generally detectable in upper respiratoy specimens during the acute phase of infection. The lowest concentration of SARS-CoV-2 viral copies this assay can detect is 131 copies/mL. A negative result does not preclude SARS-Cov-2 infection and should not be used as the sole basis for treatment or other patient management decisions. A negative result may occur with  improper specimen collection/handling, submission of specimen other than nasopharyngeal swab, presence of viral mutation(s) within the areas targeted by this assay, and inadequate number of viral copies (<131 copies/mL). A  negative result must be combined with clinical observations, patient history, and epidemiological information. The expected result is Negative.  Fact Sheet for Patients:  PinkCheek.be  Fact Sheet for Healthcare Providers:  GravelBags.it  This test is no t yet approved or cleared by the [place] FDA and  has been authorized for detection and/or diagnosis of SARS-CoV-2 by FDA under an Emergency Use Authorization (EUA). This EUA will remain  in effect (meaning this test can be used) for the duration of the COVID-19 declaration under Section 564(b)(1) of the Act, 21 U.S.C. section 360bbb-3(b)(1), unless the authorization is terminated or revoked sooner.     Influenza A by PCR NEGATIVE NEGATIVE Final   Influenza B by PCR NEGATIVE NEGATIVE Final    Comment: (NOTE) The Xpert Xpress  SARS-CoV-2/FLU/RSV assay is intended as an aid in  the diagnosis of influenza from Nasopharyngeal swab specimens and  should not be used as a sole basis for treatment. Nasal washings and  aspirates are unacceptable for Xpert Xpress SARS-CoV-2/FLU/RSV  testing.  Fact Sheet for Patients: PinkCheek.be  Fact Sheet for Healthcare Providers: GravelBags.it  This test is not yet approved or cleared by the [place] FDA and  has been authorized for detection and/or diagnosis of SARS-CoV-2 by  FDA under an Emergency Use Authorization (EUA). This EUA will remain  in effect (meaning this test can be used) for the duration of the  Covid-19 declaration under Section 564(b)(1) of the Act, 21  U.S.C. section 360bbb-3(b)(1), unless the authorization is  terminated or revoked. Performed at [facility], [place] [address], [place], [place] [address]   Culture, blood (Routine X 2) w Reflex to ID Panel     Status: None   Collection Time: 02/13/20  4:57 PM   Specimen: BLOOD LEFT FOREARM  Result  Value Ref Range Status   Specimen Description BLOOD LEFT FOREARM  Final   Special Requests   Final    BOTTLES DRAWN AEROBIC AND ANAEROBIC Blood Culture results may not be optimal due to an excessive volume of blood received in culture bottles   Culture   Final    NO GROWTH 5 DAYS Performed at [facility], [place] [address], [place], [place] [address]    Report Status 02/18/2020 FINAL  Final  Culture, blood (Routine X 2) w Reflex to ID Panel     Status: None   Collection Time: 02/13/20  5:02 PM   Specimen: BLOOD LEFT WRIST  Result Value Ref Range Status   Specimen Description BLOOD LEFT WRIST  Final   Special Requests   Final    BOTTLES DRAWN AEROBIC AND ANAEROBIC Blood Culture results may not be optimal due to an excessive volume of blood received in culture bottles   Culture   Final    NO GROWTH 5 DAYS Performed at [facility], [place] [address] [place], [place] [address]    Report Status 02/18/2020 FINAL  Final  MRSA PCR Screening     Status: Abnormal   Collection Time: 02/14/20  3:03 AM   Specimen: Nasal Mucosa; Nasopharyngeal  Result Value Ref Range Status   MRSA by PCR POSITIVE (A) NEGATIVE Final    Comment:        The GeneXpert MRSA Assay (FDA approved for NASAL specimens only), is one component of a comprehensive MRSA colonization surveillance program. It is not intended to diagnose MRSA infection nor to guide or monitor treatment for MRSA infections. RESULT CALLED TO, READ BACK BY AND VERIFIED WITH: Lillie Fragmin RN 8:25 02/14/20 ([email or web address]) Performed at [facility], [place] [address], [place], [place] [address]   CSF culture     Status: None   Collection Time: 02/14/20 11:01 AM   Specimen: CSF; Cerebrospinal Fluid  Result Value Ref Range Status   Specimen Description CSF  Final   Special Requests NONE  Final   Gram Stain   Final    WBC PRESENT,BOTH PMN AND MONONUCLEAR NO ORGANISMS SEEN CYTOSPIN SMEAR    Culture   Final    NO GROWTH 3 DAYS Performed  at [facility], [place] [address] [place], [place] [address]    Report Status 02/17/2020 FINAL  Final  Acid Fast Smear (AFB)     Status: None  Collection Time: 02/17/20  1:11 PM   Specimen: PATH Cytology CSF  Result Value Ref Range Status   AFB Specimen Processing Concentration  Final   Acid Fast Smear Negative  Final    Comment: (NOTE) Performed At: [facility] [place], [place] [id number] Rush Farmer MD YT:[id number]    Source (AFB) CSF  Final    Comment: Performed at [facility], [place] [address] [place], [place] [address]  Culture, fungus without smear     Status: None (Preliminary result)   Collection Time: 02/17/20  1:14 PM   Specimen: CSF; Cerebrospinal Fluid  Result Value Ref Range Status   Specimen Description CSF  Final   Special Requests NONE  Final   Culture   Final    NO GROWTH 3 DAYS Performed at [facility], [place] [address], [place], [place] [address]    Report Status PENDING  Incomplete     RN Pressure Injury Documentation:     Estimated body mass index is 19.39 kg/m as calculated from the following:   Height as of this encounter: 5\' 10"  (1.778 m).   Weight as of this encounter: 61.3 kg.  Malnutrition Type:  Nutrition Problem: Moderate Malnutrition   Malnutrition Characteristics:  Signs/Symptoms: mild fat depletion, moderate fat depletion, mild muscle depletion, moderate muscle depletion   Nutrition Interventions:  Interventions: Hormel Shake, Magic cup, MVI   Radiology Studies: No results found. Scheduled Meds: . amLODipine  10 mg Oral Daily  . aspirin  81 mg Oral Daily  . atorvastatin  80 mg Oral Daily  . Chlorhexidine Gluconate Cloth  6 each Topical Daily  . dexamethasone (DECADRON) injection  18 mg Intravenous Daily  . dextrose  10 mL Intravenous Q24H  . dextrose  10 mL Intravenous Q24H  . docusate  100 mg Oral BID  . ethambutol  1,200 mg Oral Daily  . fenofibrate  160 mg Oral Daily  .  heparin  5,000 Units Subcutaneous Q8H  . insulin aspart  0-9 Units Subcutaneous Q4H  . isoniazid  300 mg Oral Daily  . metoprolol succinate  100 mg Oral Daily  . multivitamin with minerals  1 tablet Oral Daily  . pantoprazole (PROTONIX) IV  40 mg Intravenous Daily  . polyethylene glycol  17 g Oral Daily  . potassium chloride  40 mEq Oral Daily  . pyrazinamide  1,500 mg Oral Daily  . vitamin B-6  50 mg Oral Daily  . rifampin  600 mg Oral Daily  . sodium chloride  500 mL Intravenous Q24H  . sodium chloride  500 mL Intravenous Q24H  . vitamin B-12  1,000 mcg Oral Daily   Continuous Infusions: . sodium chloride 250 mL (02/21/20 2236)  . sodium chloride 10 mL/hr at 02/23/20 0335  . acyclovir Stopped (02/23/20 0232)  . amphotericin  B  Liposome (AMBISOME) ADULT IV Stopped (02/23/20 0054)  . levETIRAcetam 1,000 mg (02/23/20 0439)    LOS: 10 days   Darliss Cheney, MD [facility] PAGER is on [place]  If 7PM-7AM, please contact night-coverage [email or web address]  Addendum: Discussed with Dr. Lorrin Goodell of neurology who recommends resuming Plavix today now that it has been 2 days since LP was done.

## 2020-02-23 NOTE — Progress Notes (Signed)
[facility] for Infectious Disease  Date of Admission:  02/13/2020     Reason for visit: Follow up on meningitis  Assessment: Howard Allen is a 74 y.o. male. He has a history of weight loss, treatment for Crohn's disease, and admitted with seizures and aseptic meningoencephalitis.  He underwent large volume lumbar puncture yesterday and was started on amphotericin plus RIPE therapy.  #Aseptic meningoencephalitis  Recommendations: --Continuing acyclovir for now.  Renal function stable today --Continue amphotericin antifungal coverage for now --Continue empiric TB therapy --Appreciate pharmacy assistance --Follow-up multiple CSF studies that are pending   Scheduled Meds: . amLODipine  10 mg Oral Daily  . aspirin  81 mg Oral Daily  . atorvastatin  80 mg Oral Daily  . Chlorhexidine Gluconate Cloth  6 each Topical Daily  . dexamethasone (DECADRON) injection  18 mg Intravenous Daily  . dextrose  10 mL Intravenous Q24H  . dextrose  10 mL Intravenous Q24H  . docusate  100 mg Oral BID  . ethambutol  1,200 mg Oral Daily  . fenofibrate  160 mg Oral Daily  . heparin  5,000 Units Subcutaneous Q8H  . insulin aspart  0-9 Units Subcutaneous Q4H  . isoniazid  300 mg Oral Daily  . metoprolol succinate  100 mg Oral Daily  . multivitamin with minerals  1 tablet Oral Daily  . pantoprazole (PROTONIX) IV  40 mg Intravenous Daily  . polyethylene glycol  17 g Oral Daily  . potassium chloride  40 mEq Oral Daily  . pyrazinamide  1,500 mg Oral Daily  . vitamin B-6  50 mg Oral Daily  . rifampin  600 mg Oral Daily  . sodium chloride  500 mL Intravenous Q24H  . sodium chloride  500 mL Intravenous Q24H  . vitamin B-12  1,000 mcg Oral Daily   Continuous Infusions: . sodium chloride 250 mL (02/21/20 2236)  . sodium chloride 10 mL/hr at 02/23/20 0335  . acyclovir Stopped (02/23/20 0232)  . amphotericin  B  Liposome (AMBISOME) ADULT IV Stopped (02/23/20 0054)  . levETIRAcetam  1,000 mg (02/23/20 0439)  . magnesium sulfate bolus IVPB 2 g (02/23/20 0922)   PRN Meds:.sodium chloride, acetaminophen, albuterol, diphenhydrAMINE **OR** diphenhydrAMINE, docusate, labetalol, meperidine (DEMEROL) injection, Resource ThickenUp Clear   Subjective: Patient possibly more alert today.  Hematoma above right eye may be worse due to patient picking at it.  Nursing reports that he is alert and oriented x2 and sometimes speaks incoherently.  Allergies  Allergen Reactions  . Lisinopril Anaphylaxis, Cough and Other (See Comments)    Other reaction(s): anaphylaxis Pt states it made him "cough his head off"   . Lorazepam     Other reaction(s): Hallucinations     Review of Systems: Review of Systems  Unable to perform ROS: Mental status change     Objective: Blood pressure (!) 148/86, pulse 66, temperature 97.8 F (36.6 C), temperature source Oral, resp. rate 18, height 5\' 10"  (1.778 m), weight 61.3 kg, SpO2 99 %. Body mass index is 19.39 kg/m.  Physical Exam   General: Arousable, mumbling sometimes incoherently, no acute distress Cardiac: RRR, no murmurs Lungs: No wheezes, normal effort Abdomen: soft non-distended Extremities: no edema or deformity noted bilaterally Skin: no rashes Neuro: nonfocal  Lab Results: Lab Results  Component Value Date   WBC 7.4 02/21/2020   HGB 10.4 (L) 02/21/2020   HCT 32.6 (L) 02/21/2020   MCV 88.6 02/21/2020   PLT 296 02/21/2020  Lab Results  Component Value Date   CREATININE 1.24 02/23/2020   BUN 19 02/23/2020   NA 135 02/23/2020   K 4.2 02/23/2020   CL 103 02/23/2020   CO2 20 (L) 02/23/2020    Lab Results  Component Value Date   ALT 18 02/23/2020   AST 22 02/23/2020   ALKPHOS 36 (L) 02/23/2020     Microbiology: Recent Results (from the past 240 hour(s))  Respiratory Panel by RT PCR (Flu A&B, Covid) - Nasopharyngeal Swab     Status: None   Collection Time: 02/13/20  4:17 PM   Specimen: Nasopharyngeal Swab    Result Value Ref Range Status   SARS Coronavirus 2 by RT PCR NEGATIVE NEGATIVE Final    Comment: (NOTE) SARS-CoV-2 target nucleic acids are NOT DETECTED.  The SARS-CoV-2 RNA is generally detectable in upper respiratoy specimens during the acute phase of infection. The lowest concentration of SARS-CoV-2 viral copies this assay can detect is 131 copies/mL. A negative result does not preclude SARS-Cov-2 infection and should not be used as the sole basis for treatment or other patient management decisions. A negative result may occur with  improper specimen collection/handling, submission of specimen other than nasopharyngeal swab, presence of viral mutation(s) within the areas targeted by this assay, and inadequate number of viral copies (<131 copies/mL). A negative result must be combined with clinical observations, patient history, and epidemiological information. The expected result is Negative.  Fact Sheet for Patients:  PinkCheek.be  Fact Sheet for Healthcare Providers:  GravelBags.it  This test is no t yet approved or cleared by the [place] FDA and  has been authorized for detection and/or diagnosis of SARS-CoV-2 by FDA under an Emergency Use Authorization (EUA). This EUA will remain  in effect (meaning this test can be used) for the duration of the COVID-19 declaration under Section 564(b)(1) of the Act, 21 U.S.C. section 360bbb-3(b)(1), unless the authorization is terminated or revoked sooner.     Influenza A by PCR NEGATIVE NEGATIVE Final   Influenza B by PCR NEGATIVE NEGATIVE Final    Comment: (NOTE) The Xpert Xpress SARS-CoV-2/FLU/RSV assay is intended as an aid in  the diagnosis of influenza from Nasopharyngeal swab specimens and  should not be used as a sole basis for treatment. Nasal washings and  aspirates are unacceptable for Xpert Xpress SARS-CoV-2/FLU/RSV  testing.  Fact Sheet for  Patients: PinkCheek.be  Fact Sheet for Healthcare Providers: GravelBags.it  This test is not yet approved or cleared by the [place] FDA and  has been authorized for detection and/or diagnosis of SARS-CoV-2 by  FDA under an Emergency Use Authorization (EUA). This EUA will remain  in effect (meaning this test can be used) for the duration of the  Covid-19 declaration under Section 564(b)(1) of the Act, 21  U.S.C. section 360bbb-3(b)(1), unless the authorization is  terminated or revoked. Performed at [facility], [place] [address], [place], [place] [address]   Culture, blood (Routine X 2) w Reflex to ID Panel     Status: None   Collection Time: 02/13/20  4:57 PM   Specimen: BLOOD LEFT FOREARM  Result Value Ref Range Status   Specimen Description BLOOD LEFT FOREARM  Final   Special Requests   Final    BOTTLES DRAWN AEROBIC AND ANAEROBIC Blood Culture results may not be optimal due to an excessive volume of blood received in culture bottles   Culture   Final    NO GROWTH 5 DAYS Performed at [facility], [place] [address], [place], [place] [address]    Report Status 02/18/2020 FINAL  Final  Culture, blood (Routine X 2) w Reflex to ID Panel     Status: None   Collection Time: 02/13/20  5:02 PM   Specimen: BLOOD LEFT WRIST  Result Value Ref Range Status   Specimen Description BLOOD LEFT WRIST  Final   Special Requests   Final    BOTTLES DRAWN AEROBIC AND ANAEROBIC Blood Culture results may not be optimal due to an excessive volume of blood received in culture bottles   Culture   Final    NO GROWTH 5 DAYS Performed at [facility], [place] [address] [place], [place] [address]    Report Status 02/18/2020 FINAL  Final  MRSA PCR Screening     Status: Abnormal   Collection Time: 02/14/20  3:03 AM   Specimen: Nasal Mucosa; Nasopharyngeal  Result Value Ref Range Status   MRSA by PCR POSITIVE (A) NEGATIVE Final     Comment:        The GeneXpert MRSA Assay (FDA approved for NASAL specimens only), is one component of a comprehensive MRSA colonization surveillance program. It is not intended to diagnose MRSA infection nor to guide or monitor treatment for MRSA infections. RESULT CALLED TO, READ BACK BY AND VERIFIED WITH: Lillie Fragmin RN 8:25 02/14/20 ([email or web address]) Performed at [facility], [place] [address] [place], [place] [address]   CSF culture     Status: None   Collection Time: 02/14/20 11:01 AM   Specimen: CSF; Cerebrospinal Fluid  Result Value Ref Range Status   Specimen Description CSF  Final   Special Requests NONE  Final   Gram Stain   Final    WBC PRESENT,BOTH PMN AND MONONUCLEAR NO ORGANISMS SEEN CYTOSPIN SMEAR    Culture   Final    NO GROWTH 3 DAYS Performed at [facility], [place] [address] [place], [place] [address]    Report Status 02/17/2020 FINAL  Final  Acid Fast Smear (AFB)     Status: None   Collection Time: 02/17/20  1:11 PM   Specimen: PATH Cytology CSF  Result Value Ref Range Status   AFB Specimen Processing Concentration  Final   Acid Fast Smear Negative  Final    Comment: (NOTE) Performed At: [facility] [place], [place] [id number] Rush Farmer MD YF:[id number]    Source (AFB) CSF  Final    Comment: Performed at [facility], [place] [address], [place], [place] [address]  Culture, fungus without smear     Status: None (Preliminary result)   Collection Time: 02/17/20  1:14 PM   Specimen: CSF; Cerebrospinal Fluid  Result Value Ref Range Status   Specimen Description CSF  Final   Special Requests NONE  Final   Culture   Final    NO GROWTH 3 DAYS Performed at [facility], [place] [address], [place], [place] [address]    Report Status PENDING  Incomplete      [place] [facility] [facility] [place] [facility] [phone] pager 02/23/2020, 10:10 AM

## 2020-02-23 NOTE — Progress Notes (Signed)
Pharmacy Antibiotic Note  Howard Allen is a 74 y.o. 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 Repeat CSF studies pending. So far no organism has been identified as a cause.  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 Current SCr is stable at 1.24. Will provide pre and post hydration of amphotericin B with 500 mL NS. Potassium today is within normal limits at 4.7 and Mg today is 1.5.  Plan: Amphotericin B 310 mg (~ 5 mg/kg) every 24 hours  Give 500 mL of normal saline 1 hour prior to dose Flush line with dextrose in-between normal saline and amphotericin B doses Give another 500 mL normal saline after amphotericin B infusion is complete  Administration instructions reviewed with RN Daily K and Mg- Pharmacy to replace  Magnesium 2gm IV x 1   Rifampin 600 mg daily Isoniazid 300 mg daily Pyridoxine 50 mg daily Pyrazinamide 1500 mg daily Ethambutol 1200 mg daily Dexamethasone IV 0.3 mg/kg (~18 mg) daily for 2 weeks- Will taper as appropriate pending results of CSF    Height: 5\' 10"  (177.8 cm) Weight: 61.3 kg (135 lb 2.3 oz) IBW/kg (Calculated) : 73  Temp (24hrs), Avg:98.1 F (36.7 C), Min:97.8 F (36.6 C), Max:98.3 F (36.8 C)  Recent Labs  Lab 02/17/20 0329 02/17/20 0329 02/17/20 1530 02/18/20 0419 02/19/20 0641 02/19/20 1645 02/19/20 1813 02/20/20 0449 02/21/20 0432 02/21/20 0814 02/22/20 0804 02/23/20 0341  WBC 7.6  --   --  7.9  --   --   --   --   --  7.4  --   --   CREATININE 1.18   < >  --  1.18   < >  --  1.29* 1.20 1.16  --  1.38* 1.24  VANCOTROUGH  --   --  13*  --   --  26*  --   --   --   --   --   --   VANCORANDOM  --   --   --   --   --   --   --  16  --   --   --   --    < > = values in this interval not displayed.    Estimated Creatinine Clearance: 46.7 mL/min (by C-G formula based on SCr of 1.24 mg/dL).    Allergies  Allergen Reactions  . Lisinopril  Anaphylaxis, Cough and Other (See Comments)    Other reaction(s): anaphylaxis Pt states it made him "cough his head off"   . Lorazepam     Other reaction(s): Hallucinations      Param Capri A. Levada Dy, PharmD, BCPS, FNKF Clinical Pharmacist [place] Please utilize Amion for appropriate phone number to reach the unit pharmacist ([place])   02/23/2020 8:39 AM

## 2020-02-24 DIAGNOSIS — G0490 Encephalitis and encephalomyelitis, unspecified: Secondary | ICD-10-CM | POA: Diagnosis not present

## 2020-02-24 LAB — GLUCOSE, CAPILLARY
Glucose-Capillary: 78 mg/dL (ref 70–99)
Glucose-Capillary: 88 mg/dL (ref 70–99)
Glucose-Capillary: 83 mg/dL (ref 70–99)
Glucose-Capillary: 96 mg/dL (ref 70–99)
Glucose-Capillary: 101 mg/dL — ABNORMAL HIGH (ref 70–99)
Glucose-Capillary: 86 mg/dL (ref 70–99)

## 2020-02-24 LAB — BASIC METABOLIC PANEL
Anion gap: 8 (ref 5–15)
BUN: 23 mg/dL (ref 8–23)
CO2: 20 mmol/L — ABNORMAL LOW (ref 22–32)
Calcium: 8.8 mg/dL — ABNORMAL LOW (ref 8.9–10.3)
Chloride: 104 mmol/L (ref 98–111)
Creatinine, Ser: 1.21 mg/dL (ref 0.61–1.24)
GFR, Estimated: 59 mL/min — ABNORMAL LOW (ref >60)
Glucose, Bld: 83 mg/dL (ref 70–99)
Potassium: 4.3 mmol/L (ref 3.5–5.1)
Sodium: 132 mmol/L — ABNORMAL LOW (ref 135–145)

## 2020-02-24 LAB — HISTOPLASMA ANTIGEN, URINE: Histoplasma Antigen, urine: <0.5 (ref <0.5)

## 2020-02-24 LAB — MAGNESIUM: Magnesium: 1.8 mg/dL (ref 1.7–2.4)

## 2020-02-24 LAB — PHOSPHORUS: Phosphorus: 3.2 mg/dL (ref 2.5–4.6)

## 2020-02-24 MED ORDER — CLOPIDOGREL BISULFATE 75 MG PO TABS
75.0000 mg | ORAL_TABLET | Freq: Every day | ORAL | Status: DC
  Administered 2020-02-24 – 2020-02-26 (×3): 75 mg via ORAL
  Filled 2020-02-24 (×3): qty 1

## 2020-02-24 NOTE — Progress Notes (Signed)
[facility] for Infectious Disease  Date of Admission:  02/13/2020     Reason for visit: Follow up on meningitis  Assessment: Cynthia Stainback is a 74 y.o. male. He has a history of weight loss, treatment for Crohn's disease, and admitted with seizures and aseptic meningoencephalitis.  He underwent large volume lumbar puncture yesterday and was started on amphotericin plus RIPE therapy.  Last 24 hrs: Patient's renal function stable this morning with creatinine of 1.2 HSV PCR negative MTB PCR from LP #1 negative Other CSF studies pending No other acute events noted  #Aseptic meningoencephalitis  Recommendations: --Stop acyclovir given 2 negative HSV PCRs --Continue amphotericin antifungal coverage for now --Continue empiric TB therapy while awaiting MTB PCR from 2nd LP --Appreciate pharmacy assistance --Follow-up multiple CSF studies that are pending   Scheduled Meds: . amLODipine  10 mg Oral Daily  . aspirin  81 mg Oral Daily  . atorvastatin  80 mg Oral Daily  . Chlorhexidine Gluconate Cloth  6 each Topical Daily  . dexamethasone (DECADRON) injection  18 mg Intravenous Daily  . dextrose  10 mL Intravenous Q24H  . dextrose  10 mL Intravenous Q24H  . docusate  100 mg Oral BID  . ethambutol  1,200 mg Oral Daily  . fenofibrate  160 mg Oral Daily  . heparin  5,000 Units Subcutaneous Q8H  . insulin aspart  0-9 Units Subcutaneous Q4H  . isoniazid  300 mg Oral Daily  . levETIRAcetam  1,000 mg Oral Q12H  . metoprolol succinate  100 mg Oral Daily  . multivitamin with minerals  1 tablet Oral Daily  . pantoprazole sodium  40 mg Oral Daily  . polyethylene glycol  17 g Oral Daily  . potassium chloride  40 mEq Oral Daily  . pyrazinamide  1,500 mg Oral Daily  . vitamin B-6  50 mg Oral Daily  . rifampin  600 mg Oral Daily  . sodium chloride  500 mL Intravenous Q24H  . sodium chloride  500 mL Intravenous Q24H  . vitamin B-12  1,000 mcg Oral Daily   Continuous  Infusions: . sodium chloride Stopped (02/24/20 0116)  . sodium chloride Stopped (02/24/20 0218)  . acyclovir Stopped (02/24/20 0152)  . amphotericin  B  Liposome (AMBISOME) ADULT IV Stopped (02/24/20 0014)   PRN Meds:.sodium chloride, acetaminophen, albuterol, diphenhydrAMINE **OR** diphenhydrAMINE, docusate, labetalol, meperidine (DEMEROL) injection, Resource ThickenUp Clear   Subjective: Patient resting in bed.  Allergies  Allergen Reactions  . Lisinopril Anaphylaxis, Cough and Other (See Comments)    Other reaction(s): anaphylaxis Pt states it made him "cough his head off"   . Lorazepam     Other reaction(s): Hallucinations     Review of Systems: Review of Systems  Unable to perform ROS: Mental status change     Objective: Blood pressure 135/77, pulse (!) 59, temperature 97.9 F (36.6 C), resp. rate 18, height 5\' 10"  (1.778 m), weight 66.7 kg, SpO2 99 %. Body mass index is 21.1 kg/m.  Physical Exam   General: Lying in bed, no distress, resting comfortably Lungs: normal effort Extremities: no edema or deformity noted bilaterally Skin: no rashes Neuro: nonfocal  Lab Results: Lab Results  Component Value Date   WBC 7.4 02/21/2020   HGB 10.4 (L) 02/21/2020   HCT 32.6 (L) 02/21/2020   MCV 88.6 02/21/2020   PLT 296 02/21/2020    Lab Results  Component Value Date   CREATININE 1.21 02/24/2020   BUN 23  02/24/2020   NA 132 (L) 02/24/2020   K 4.3 02/24/2020   CL 104 02/24/2020   CO2 20 (L) 02/24/2020    Lab Results  Component Value Date   ALT 18 02/23/2020   AST 22 02/23/2020   ALKPHOS 36 (L) 02/23/2020     Microbiology: Recent Results (from the past 240 hour(s))  CSF culture     Status: None   Collection Time: 02/14/20 11:01 AM   Specimen: CSF; Cerebrospinal Fluid  Result Value Ref Range Status   Specimen Description CSF  Final   Special Requests NONE  Final   Gram Stain   Final    WBC PRESENT,BOTH PMN AND MONONUCLEAR NO ORGANISMS SEEN CYTOSPIN  SMEAR    Culture   Final    NO GROWTH 3 DAYS Performed at [facility], [address], [place], [place] [address]    Report Status 02/17/2020 FINAL  Final  Acid Fast Smear (AFB)     Status: None   Collection Time: 02/17/20  1:11 PM   Specimen: PATH Cytology CSF  Result Value Ref Range Status   AFB Specimen Processing Concentration  Final   Acid Fast Smear Negative  Final    Comment: (NOTE) Performed At: [facility] [place], [place] [id number] Rush Farmer MD [id number]    Source (AFB) CSF  Final    Comment: Performed at [facility], [place] [address], [place], [place] [address]  Culture, fungus without smear     Status: None (Preliminary result)   Collection Time: 02/17/20  1:14 PM   Specimen: CSF; Cerebrospinal Fluid  Result Value Ref Range Status   Specimen Description CSF  Final   Special Requests NONE  Final   Culture   Final    NO FUNGUS ISOLATED AFTER 3 DAYS Performed at [facility], [place] [address] [place], [place] [address]    Report Status PENDING  Incomplete  Culture, fungus without smear     Status: None (Preliminary result)   Collection Time: 02/22/20 11:38 AM   Specimen: CSF; Other  Result Value Ref Range Status   Specimen Description CSF TUBE 2  Final   Special Requests Immunocompromised  Final   Culture   Final    NO FUNGUS ISOLATED AFTER 1 DAY Performed at [facility], [address], [place], [place] [address]    Report Status PENDING  Incomplete  Acid Fast Smear (AFB)     Status: None   Collection Time: 02/22/20 11:38 AM   Specimen: CSF; Cerebrospinal Fluid  Result Value Ref Range Status   AFB Specimen Processing Concentration  Final   Acid Fast Smear Negative  Final    Comment: (NOTE) Performed At: [facility] [place], [place] [id number] Rush Farmer MD TA:[id number]    Source (AFB) CSF  Final    Comment: TUBE 2 Performed at [facility], [address] [place],  [address]       [facility] [phone] pager 02/24/2020, 9:27 AM

## 2020-02-24 NOTE — Progress Notes (Signed)
PROGRESS NOTE    Howard Allen  ZOX:[id number] DOB: Sep 29, 1945 DOA: 02/13/2020 PCP: System, Provider Not In   Brief Narrative: The patient is a 74 year old Caucasian male with a past medical history significant for Crohn's disease, hypertension, hyperlipidemia, depression history of hospitalization in June 2021 for pneumatosis status post ileocolonic resection and lysis of adhesions who presented with multiple tonic-clonic seizures complicated by status epilepticus that required intubation and loading with Keppra. CT of the head was negative and initially when he was brought in to the ED by EMS there was at least 3 witnessed generalized tonic-clonic seizures noted. PCCM was consulted for admission given that he was intubated on arrival and unresponsive. He was intubated on 02/13/2020 and extubated 02/15/2020. Neurology was consulted and he underwent further work-up and was found to have a signal abnormality in the left limb is most consistent with an acute infarct. Echocardiogram done showed an EF of 55 to 60% with moderate LVH and grade 1 diastolic dysfunction. EEG testing on 10/9 was negative. 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 He underwent several viral serologies and had a lumbar puncture sent on 02/14/2020. He was started on IV vancomycin, IV ceftriaxone, IV ampicillin and IV acyclovir for suspected meningitis. CSF analysis was consistent with aseptic meningitis with high white blood cells and very low glucose level. CSF culture was negative for anything and everything. He was transferred to [facility] service on 02/19/2020 and remained confused. Subsequently ID was consulted and they were suspecting aseptic meningoencephalitis and recommended large volume LP. Patient was already on aspirin and Plavix for past 5 days. His Plavix was stopped on 02/20/2020. P2 Y 12 was checked and based on that, neurology performed another large-volume LP on 02/22/2020 and several labs were  sent by ID. 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   Assessment & Plan:   Principal Problem:   Aseptic meningitis Active Problems:   Seizure ([place])   Weight loss   Encounter for nasogastric (NG) tube placement  Aseptic meningitis  - LP performed on 10/7 consistent with meningitis - Cultures are NGTD at 3 Days; HSV with positive IgG... pcr negative. - VDLR and cryptococcal antigen negative., and West nile serology pending - Quantiferon gold was indeterminate.  Patient was on vancomycin, ceftriaxone and acyclovir.  Consulted ID on 02/20/2020.  Dr. Tommy Medal recommended repeat large-volume LP to send more labs.  Unfortunately, patient was already on Plavix which I discontinued on 02/20/2020.  Rocephin and vancomycin discontinued but acyclovir continues.  Had a long multidisciplinary discussion between me, Dr. Tommy Medal and Dr. Hortense Ramal of neurology on 02/21/2020.  Checked P2 Y12 however it was too low.  Not safe to do LP according to neurology.  Neurology to assess timing of LP.  Defer to ID about starting empiric antibiotics or waiting until LP done.  Plavix discontinued on 02/20/2020. Patient finally underwent large-volume LP by neurology on 02/22/2020. 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  HCV PCR once again negative.  Acyclovir discontinued on 02/24/2020 but he remains on rest of the anti-TB and antifungal medications.  Large hematoma above right eye: He developed large hematoma on the morning of 02/22/2020 after he is continued to pluck his right forehead laceration. Due to the size, he was unable to open his eye but there was  no pressure on the eyeball. For this reason, I had consulted ENT and he was evaluated by them but they did not feel the need to evacuate hematoma.  Patient's hematoma is  decreasing in size and he is able to open about 70% of his right eye.  No more bleeding.  Acute Ischemic Infarct  -Seen on MRI imaging on 10/7: left thalamic signal abnormality measuring 2.3 cm most consistent with acute infarct; no edema or hemorrhagic conversion. 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 2 mm left paraophthalmic ICA aneurysm. Widely patent cervical carotid and vertebral arteries. Right upper lobe pulmonary nodules measuring up to 5 mm. Aortic Atherosclerosis (ICD10-I70.0) and Emphysema." -  now on atorvastatin 80 mg despite that he takes 20 at home given his acute CVA - TTE negative for vegetations or thrombosis.  - Continue high intensity statin; Lipid panel done and showed a total cholesterol/HDL ratio 3.3, cholesterol level 154, HDL level 46, LDL 79, triglycerides 146, VLDL 29 -PT OT recommending CIR next-appreciate neurology further evaluation recommendations.  We will continue aspirin but discontinued Plavix for the reasons mentioned above. Will defer to neurology for the timing of resumption of his Plavix.  Tonic-Clonic Seizures Status Epilepticus: Resolved - Secondary to meningitis  - Neurology on board; appreciate their recommendations Continue Keppra.  Hypomagnesemia Resolved.  Hypophosphatemia Resolved.  B12 Deficiency  Moderate Malnutrition - Likely due to malnutrition and anorexia, possibly worsened by malabsorption given bowel inflammation seen on imaging the past several months prior to resection. Of note, 70 lb weight loss in the past 10 months.  - Consult to dietician for tube feed initiation, he was started on tube feedings which was discontinued now that he is tolerating dysphagia 2 diet. Nutritionist now recommending hormonal shake twice daily as  well as Magic cup twice daily with meals and multivitamin with minerals daily.  HTN/HLD -Home medications include Toprol 100 mg, Amlodipine 5 mg, Lipitor 20 mg, Fenofibrate 160 mg.  Blood pressure very well controlled.  Will continue amlodipine to 10 mg and Toprol-XL 100 mg p.o. daily.   Normocytic Anemia  Hx of Iron Deficiency Anemia B12 Deficiency  - B12 levels low. Iron panel shows low saturation, however normal iron and TIBC, unclear picture as not consistent completely with iron deficiency anemia or anemia of chronic disease.  Hemoglobin is stable.  ? Crohn's Disease s/p Ileocolic Resection  - No longer on immunosuppressive therapy. -Takes budesonide as an outpatient but this has not been resumed yet.  AKI on CKD stage II Back to normal.  DVT prophylaxis: SCDs Heparin 5000 units subcu every 8 hours Code Status: FULL CODE  Family Communication: No family present at bedside.  Daughter Threasa Beards updated today. Disposition Plan: Pending further clinical improvement back to baseline and clearance by neurology prior to safe discharge disposition  Status is: Inpatient  Remains inpatient appropriate because:Altered mental status, Unsafe d/c plan, IV treatments appropriate due to intensity of illness or inability to take PO and Inpatient level of care appropriate due to severity of illness  Dispo: The patient is from: Home              Anticipated d/c is to: CIR              Anticipated d/c date is:> 3 days  Patient currently is not medically stable to d/c.  Consultants:   Neurology  PCCM Transfer   Procedures:  10/6 >> ETT placed 10/7 >> LP 10/8 >> Extubated   Significant Diagnostic Tests:  10/6 CT Head >> No acute intracranial abnormalities; age-related volume loss and chronic small vessel disease 10/7 MRI brain: 1. Signal abnormality in the left thalamus most consistent with an acute infarct. 2. Signal abnormality in the mesial left temporal lobe and  splenium of the corpus callosum which may reflect acute infarct, sequelae of recent seizure activity, or encephalitis (including herpes encephalitis). No contralateral cerebral involvement. 3. Mild chronic small vessel ischemic disease. 10/8 echo: LVEF 55-60%, mod LVH, Grade I diastolic dysfunction 93/2 CT Head >> Progressive low density in the left thalamus related to acute infarction; no hemorrhage 10/9 EEG >> Negative  Micro Data:  10/6 COVID-19 + influenza >> Negative  10/6 Blood Cultures >> NGTD 10/7 CSF Culture >> NGTD 10/7 HSV serology >> 2.02 on igg testing. Will send pcr to determine past or active.  10/7 West Nile serology >>  10/7 VRDL serology >> 10/8 Quanitferon Gold  >> indeterminant 10/9 acid fast csf:  10/11 HSV PCR: pending   Antimicrobials:  Anti-infectives (From admission, onward)   Start     Dose/Rate Route Frequency Ordered Stop   02/23/20 0100  acyclovir (ZOVIRAX) 615 mg in dextrose 5 % 100 mL IVPB  Status:  Discontinued        10 mg/kg  61.3 kg 112.3 mL/hr over 60 Minutes Intravenous Every 12 hours 02/22/20 1537 02/24/20 0936   02/22/20 2100  amphotericin B liposome (AMBISOME) 310 mg in dextrose 5 % 500 mL IVPB        5 mg/kg  61.3 kg 288.8 mL/hr over 120 Minutes Intravenous Every 24 hours 02/22/20 1524     02/22/20 1800  rifampin (RIFADIN) capsule 600 mg        600 mg Oral Daily 02/22/20 1520     02/22/20 1800  isoniazid (NYDRAZID) tablet 300 mg        300 mg Oral Daily 02/22/20 1520     02/22/20 1800  pyrazinamide tablet 1,500 mg        1,500 mg Oral Daily 02/22/20 1520     02/22/20 1800  ethambutol (MYAMBUTOL) tablet 1,200 mg        1,200 mg Oral Daily 02/22/20 1520     02/20/20 1000  vancomycin (VANCOCIN) IVPB 1000 mg/200 mL premix  Status:  Discontinued        1,000 mg 200 mL/hr over 60 Minutes Intravenous Every 24 hours 02/20/20 0555 02/21/20 1259   02/19/20 1811  vancomycin variable dose per unstable renal function (pharmacist dosing)   Status:  Discontinued         Does not apply See admin instructions 02/19/20 1811 02/20/20 0555   02/18/20 0500  vancomycin (VANCOREADY) IVPB 750 mg/150 mL  Status:  Discontinued        750 mg 150 mL/hr over 60 Minutes Intravenous Every 12 hours 02/17/20 1651 02/19/20 1811   02/15/20 0300  vancomycin (VANCOREADY) IVPB 500 mg/100 mL        500 mg 100 mL/hr over 60 Minutes Intravenous Every 12 hours 02/14/20 1352 02/17/20 1755   02/14/20 1400  acyclovir (ZOVIRAX) 690 mg in dextrose 5 % 100 mL IVPB  Status:  Discontinued        10 mg/kg  68.9 kg 113.8 mL/hr over 60 Minutes Intravenous Every 12 hours 02/14/20 1245 02/22/20  1537   02/14/20 1400  vancomycin (VANCOREADY) IVPB 1250 mg/250 mL        1,250 mg 166.7 mL/hr over 90 Minutes Intravenous STAT 02/14/20 1352 02/14/20 1655   02/14/20 1300  cefTRIAXone (ROCEPHIN) 2 g in sodium chloride 0.9 % 100 mL IVPB  Status:  Discontinued        2 g 200 mL/hr over 30 Minutes Intravenous Every 12 hours 02/14/20 1245 02/20/20 1732   02/14/20 1300  ampicillin (OMNIPEN) 2 g in sodium chloride 0.9 % 100 mL IVPB  Status:  Discontinued        2 g 300 mL/hr over 20 Minutes Intravenous Every 6 hours 02/14/20 1245 02/18/20 1051       Subjective: Seen and examined.  Patient sleepy but easily arousable.  More alert and oriented x2 today.  He was oriented to place, he knew he was in the hospital but could not tell me the name.  He also told me that he was from [place].  Objective: Vitals:   02/23/20 2020 02/23/20 2356 02/24/20 0415 02/24/20 0843  BP: 127/65 123/77 122/67 135/77  Pulse: (!) 55 (!) 59 (!) 59 (!) 59  Resp: 20 18 20 18   Temp: 97.8 F (36.6 C) 97.6 F (36.4 C) (!) 97.5 F (36.4 C) 97.9 F (36.6 C)  TempSrc: Oral Oral Oral   SpO2: 99% 100% 100% 99%  Weight:   66.7 kg   Height:        Intake/Output Summary (Last 24 hours) at 02/24/2020 1018 Last data filed at 02/24/2020 1012 Gross per 24 hour  Intake 2888.02 ml  Output --  Net 2888.02  ml   Filed Weights   02/20/20 0211 02/21/20 0322 02/24/20 0415  Weight: 60.8 kg 61.3 kg 66.7 kg   Examination: Physical Exam:  General exam: Appears calm and comfortable  Respiratory system: Clear to auscultation. Respiratory effort normal. Cardiovascular system: S1 & S2 heard, RRR. No JVD, murmurs, rubs, gallops or clicks. No pedal edema. Gastrointestinal system: Abdomen is nondistended, soft and nontender. No organomegaly or masses felt. Normal bowel sounds heard. Central nervous system: Alert and oriented x2.  Some dysarthria.  No focal neurological deficits. Extremities: Hematoma over right eyebrow. Skin: No rashes, lesions or ulcers.   Data Reviewed: I have personally reviewed following labs and imaging studies  CBC: Recent Labs  Lab 02/18/20 0419 02/21/20 0814  WBC 7.9 7.4  NEUTROABS  --  5.5  HGB 9.5* 10.4*  HCT 29.4* 32.6*  MCV 87.0 88.6  PLT 217 765   Basic Metabolic Panel: Recent Labs  Lab 02/18/20 0419 02/18/20 0419 02/19/20 0641 02/19/20 1813 02/20/20 0449 02/21/20 0432 02/22/20 0804 02/23/20 0341 02/24/20 0256  NA 137   < > 135  --  134* 131* 136 135 132*  K 4.2   < > 3.8  --  3.4* 3.8 3.9 4.2 4.3  CL 103   < > 96*  --  99 98 97* 103 104  CO2 19*   < > 24  --  24 22 27  20* 20*  GLUCOSE 108*   < > 93  --  97 102* 119* 106* 83  BUN 20   < > 18  --  15 14 15 19 23   CREATININE 1.18   < > 1.27*   < > 1.20 1.16 1.38* 1.24 1.21  CALCIUM 9.0   < > 9.4  --  8.8* 8.9 9.7 9.3 8.8*  MG 2.2  --  1.6*  --  2.0  --   --  1.5* 1.8  PHOS 2.7   < > 1.8*  --  3.0 2.8 2.8 3.4 3.2   < > = values in this interval not displayed.   GFR: Estimated Creatinine Clearance: 52.1 mL/min (by C-G formula based on SCr of 1.21 mg/dL). Liver Function Tests: Recent Labs  Lab 02/23/20 0341  AST 22  ALT 18  ALKPHOS 36*  BILITOT 0.5  PROT 5.9*  ALBUMIN 3.2*   No results for input(s): LIPASE, AMYLASE in the last 168 hours. No results for input(s): AMMONIA in the last 168  hours. Coagulation Profile: No results for input(s): INR, PROTIME in the last 168 hours. Cardiac Enzymes: No results for input(s): CKTOTAL, CKMB, CKMBINDEX, TROPONINI in the last 168 hours. BNP (last 3 results) No results for input(s): PROBNP in the last 8760 hours. HbA1C: No results for input(s): HGBA1C in the last 72 hours. CBG: Recent Labs  Lab 02/23/20 2020 02/23/20 2358 02/24/20 0414 02/24/20 0815 02/24/20 0833  GLUCAP 102* 130* 78 88 83   Lipid Profile: No results for input(s): CHOL, HDL, LDLCALC, TRIG, CHOLHDL, LDLDIRECT in the last 72 hours. Thyroid Function Tests: No results for input(s): TSH, T4TOTAL, FREET4, T3FREE, THYROIDAB in the last 72 hours. Anemia Panel: No results for input(s): VITAMINB12, FOLATE, FERRITIN, TIBC, IRON, RETICCTPCT in the last 72 hours. Sepsis Labs: No results for input(s): PROCALCITON, LATICACIDVEN in the last 168 hours.  Recent Results (from the past 240 hour(s))  CSF culture     Status: None   Collection Time: 02/14/20 11:01 AM   Specimen: CSF; Cerebrospinal Fluid  Result Value Ref Range Status   Specimen Description CSF  Final   Special Requests NONE  Final   Gram Stain   Final    WBC PRESENT,BOTH PMN AND MONONUCLEAR NO ORGANISMS SEEN CYTOSPIN SMEAR    Culture   Final    NO GROWTH 3 DAYS Performed at [facility], [address], [place], [place] [address]    Report Status 02/17/2020 FINAL  Final  Acid Fast Smear (AFB)     Status: None   Collection Time: 02/17/20  1:11 PM   Specimen: PATH Cytology CSF  Result Value Ref Range Status   AFB Specimen Processing Concentration  Final   Acid Fast Smear Negative  Final    Comment: (NOTE) Performed At: [facility] [place], [place] [id number] Rush Farmer MD [id number]    Source (AFB) CSF  Final    Comment: Performed at [facility], [place] [address], [place], [place] [address]  Culture, fungus without smear     Status: None (Preliminary  result)   Collection Time: 02/17/20  1:14 PM   Specimen: CSF; Cerebrospinal Fluid  Result Value Ref Range Status   Specimen Description CSF  Final   Special Requests NONE  Final   Culture   Final    NO FUNGUS ISOLATED AFTER 3 DAYS Performed at [facility], [place] [address], [place], [place] [address]    Report Status PENDING  Incomplete  Culture, fungus without smear     Status: None (Preliminary result)   Collection Time: 02/22/20 11:38 AM   Specimen: CSF; Other  Result Value Ref Range Status   Specimen Description CSF TUBE 2  Final   Special Requests Immunocompromised  Final   Culture   Final    NO FUNGUS ISOLATED AFTER 1 DAY Performed at [facility], [address], [place], [place] [address]    Report Status  PENDING  Incomplete  Acid Fast Smear (AFB)     Status: None   Collection Time: 02/22/20 11:38 AM   Specimen: CSF; Cerebrospinal Fluid  Result Value Ref Range Status   AFB Specimen Processing Concentration  Final   Acid Fast Smear Negative  Final    Comment: (NOTE) Performed At: [facility] [place], [place] [id number] Rush Farmer MD [id number]    Source (AFB) CSF  Final    Comment: TUBE 2 Performed at [facility], [place] [address] [place], [place] [address]      RN Pressure Injury Documentation:     Estimated body mass index is 21.1 kg/m as calculated from the following:   Height as of this encounter: 5\' 10"  (1.778 m).   Weight as of this encounter: 66.7 kg.  Malnutrition Type:  Nutrition Problem: Moderate Malnutrition   Malnutrition Characteristics:  Signs/Symptoms: mild fat depletion, moderate fat depletion, mild muscle depletion, moderate muscle depletion   Nutrition Interventions:  Interventions: Hormel Shake, Magic cup, MVI   Radiology Studies: No results found. Scheduled Meds: . amLODipine  10 mg Oral Daily  . aspirin  81 mg Oral Daily  . atorvastatin  80 mg Oral Daily  . Chlorhexidine  Gluconate Cloth  6 each Topical Daily  . dexamethasone (DECADRON) injection  18 mg Intravenous Daily  . dextrose  10 mL Intravenous Q24H  . dextrose  10 mL Intravenous Q24H  . docusate  100 mg Oral BID  . ethambutol  1,200 mg Oral Daily  . fenofibrate  160 mg Oral Daily  . heparin  5,000 Units Subcutaneous Q8H  . insulin aspart  0-9 Units Subcutaneous Q4H  . isoniazid  300 mg Oral Daily  . levETIRAcetam  1,000 mg Oral Q12H  . metoprolol succinate  100 mg Oral Daily  . multivitamin with minerals  1 tablet Oral Daily  . pantoprazole sodium  40 mg Oral Daily  . polyethylene glycol  17 g Oral Daily  . potassium chloride  40 mEq Oral Daily  . pyrazinamide  1,500 mg Oral Daily  . vitamin B-6  50 mg Oral Daily  . rifampin  600 mg Oral Daily  . sodium chloride  500 mL Intravenous Q24H  . sodium chloride  500 mL Intravenous Q24H  . vitamin B-12  1,000 mcg Oral Daily   Continuous Infusions: . sodium chloride Stopped (02/24/20 0116)  . sodium chloride Stopped (02/24/20 0218)  . amphotericin  B  Liposome (AMBISOME) ADULT IV Stopped (02/24/20 0014)    LOS: 11 days   Darliss Cheney, MD [facility] PAGER is on [place]  If 7PM-7AM, please contact night-coverage [email or web address]

## 2020-02-24 NOTE — Progress Notes (Signed)
Pharmacy Antibiotic Note  Howard Allen is a 74 y.o. 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 Repeat CSF studies pending. So far no organism has been identified as a cause.  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 Current SCr is stable at 1.24. Will provide pre and post hydration of amphotericin B with 500 mL NS. Potassium today is within normal limits at 4.3 and Mg today is 1.8.  Plan: Amphotericin B 310 mg (~ 5 mg/kg) every 24 hours  Give 500 mL of normal saline 1 hour prior to dose Flush line with dextrose in-between normal saline and amphotericin B doses Give another 500 mL normal saline after amphotericin B infusion is complete  Administration instructions reviewed with RN Daily K and Mg- Pharmacy to replace     Rifampin 600 mg daily Isoniazid 300 mg daily Pyridoxine 50 mg daily Pyrazinamide 1500 mg daily Ethambutol 1200 mg daily Dexamethasone IV 0.3 mg/kg (~18 mg) daily for 2 weeks- Will taper as appropriate pending results of CSF    Height: 5\' 10"  (177.8 cm) Weight: 66.7 kg (147 lb 0.8 oz) IBW/kg (Calculated) : 73  Temp (24hrs), Avg:97.7 F (36.5 C), Min:97.5 F (36.4 C), Max:97.8 F (36.6 C)  Recent Labs  Lab 02/17/20 1530 02/18/20 0419 02/19/20 0641 02/19/20 1645 02/19/20 1813 02/20/20 0449 02/21/20 0432 02/21/20 0814 02/22/20 0804 02/23/20 0341 02/24/20 0256  WBC  --  7.9  --   --   --   --   --  7.4  --   --   --   CREATININE  --  1.18   < >  --    < > 1.20 1.16  --  1.38* 1.24 1.21  VANCOTROUGH 13*  --   --  26*  --   --   --   --   --   --   --   VANCORANDOM  --   --   --   --   --  16  --   --   --   --   --    < > = values in this interval not displayed.    Estimated Creatinine Clearance: 52.1 mL/min (by C-G formula based on SCr of 1.21 mg/dL).    Allergies  Allergen Reactions  . Lisinopril Anaphylaxis, Cough and Other (See Comments)    Other  reaction(s): anaphylaxis Pt states it made him "cough his head off"   . Lorazepam     Other reaction(s): Hallucinations      Gaither Biehn A. Levada Dy, PharmD, BCPS, FNKF Clinical Pharmacist [place] Please utilize Amion for appropriate phone number to reach the unit pharmacist ([place])   02/24/2020 8:00 AM

## 2020-02-25 DIAGNOSIS — R569 Unspecified convulsions: Secondary | ICD-10-CM | POA: Diagnosis not present

## 2020-02-25 DIAGNOSIS — G030 Nonpyogenic meningitis: Secondary | ICD-10-CM | POA: Diagnosis not present

## 2020-02-25 DIAGNOSIS — G40901 Epilepsy, unspecified, not intractable, with status epilepticus: Secondary | ICD-10-CM | POA: Diagnosis not present

## 2020-02-25 DIAGNOSIS — E876 Hypokalemia: Secondary | ICD-10-CM | POA: Diagnosis not present

## 2020-02-25 LAB — MISC LABCORP TEST (SEND OUT)
Labcorp test code: 985
Labcorp test code: 9986
Labcorp test code: 985

## 2020-02-25 LAB — VARICELLA-ZOSTER BY PCR: Varicella-Zoster, PCR: NEGATIVE

## 2020-02-25 LAB — GLUCOSE, CAPILLARY
Glucose-Capillary: 113 mg/dL — ABNORMAL HIGH (ref 70–99)
Glucose-Capillary: 121 mg/dL — ABNORMAL HIGH (ref 70–99)
Glucose-Capillary: 136 mg/dL — ABNORMAL HIGH (ref 70–99)
Glucose-Capillary: 137 mg/dL — ABNORMAL HIGH (ref 70–99)
Glucose-Capillary: 85 mg/dL (ref 70–99)
Glucose-Capillary: 89 mg/dL (ref 70–99)
Glucose-Capillary: 99 mg/dL (ref 70–99)

## 2020-02-25 LAB — BASIC METABOLIC PANEL
Anion gap: 9 (ref 5–15)
BUN: 25 mg/dL — ABNORMAL HIGH (ref 8–23)
CO2: 17 mmol/L — ABNORMAL LOW (ref 22–32)
Calcium: 9.1 mg/dL (ref 8.9–10.3)
Chloride: 106 mmol/L (ref 98–111)
Creatinine, Ser: 1.23 mg/dL (ref 0.61–1.24)
GFR, Estimated: 58 mL/min — ABNORMAL LOW (ref >60)
Glucose, Bld: 100 mg/dL — ABNORMAL HIGH (ref 70–99)
Potassium: 4.1 mmol/L (ref 3.5–5.1)
Sodium: 132 mmol/L — ABNORMAL LOW (ref 135–145)

## 2020-02-25 LAB — FLOW CYTOMETRY REQUEST - FLUID (INPATIENT)

## 2020-02-25 LAB — MAGNESIUM: Magnesium: 1.7 mg/dL (ref 1.7–2.4)

## 2020-02-25 LAB — PHOSPHORUS: Phosphorus: 3.7 mg/dL (ref 2.5–4.6)

## 2020-02-25 MED ORDER — MAGNESIUM SULFATE 4 GM/100ML IV SOLN
4.0000 g | Freq: Once | INTRAVENOUS | Status: AC
  Administered 2020-02-25: 4 g via INTRAVENOUS
  Filled 2020-02-25: qty 100

## 2020-02-25 NOTE — Progress Notes (Signed)
Physical Therapy Treatment Patient Details Name: Howard Allen MRN: [id number] DOB: Oct 13, 1945 Today's Date: 02/25/2020    History of Present Illness Pt is a 74 year old male who was having multiple seizures and has a history of falls. 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 NIH 4 on 02/15/20 and then 14 on 02/17/20. Medical hx consisting of Chron's disease and HTN.    PT Comments    Today's skilled session focused on mobility progression and strengthening. Pt nodding off to sleep with ex's needing cues to stay to task. Deferred edge of bed today due to this. Acute PT to continue during pt's hospital stay.   Follow Up Recommendations  Supervision/Assistance - 24 hour;SNF     Equipment Recommendations  Rolling walker with 5" wheels;3in1 (PT);Wheelchair (measurements PT);Wheelchair cushion (measurements PT);Hospital bed    Precautions / Restrictions Precautions Precautions: Fall;Other (comment) Precaution Comments: seizures, condom cath Restrictions Weight Bearing Restrictions: No    Mobility   Transfers                 General transfer comment: pt unable to maintain arousal with ex's. deferred edge of bed due to this and no second assist available.   Modified Rankin (Stroke Patients Only) Modified Rankin (Stroke Patients Only) Pre-Morbid Rankin Score: No symptoms Modified Rankin: Moderately severe disability     Cognition Arousal/Alertness: Lethargic Behavior During Therapy: WFL for tasks assessed/performed;Flat affect Overall Cognitive Status: No family/caregiver present to determine baseline  cognitive functioning              Exercises General Exercises - Lower Extremity Ankle Circles/Pumps: AAROM;PROM;Strengthening;Both;10 reps;Supine Short Arc Quad: AAROM;PROM;Strengthening;Both;10 reps;Supine Heel Slides: AAROM;PROM;Strengthening;Both;10 reps;Supine Hip ABduction/ADduction: AAROM;PROM;Strengthening;Both;10 reps;Supine Straight Leg Raises: AAROM;PROM;Strengthening;Both;10 reps;Supine     Pertinent Vitals/Pain Pain Assessment: 0-10 Pain Score: 0-No pain     PT Goals (current goals can now be found in the care plan section) Acute Rehab PT Goals Patient Stated Goal: to go home PT Goal Formulation: With patient Time For Goal Achievement: 03/04/20 Potential to Achieve Goals: Good Progress towards PT goals: Progressing toward goals    Frequency    Min 3X/week      PT Plan Current plan remains appropriate    AM-PAC PT "6 Clicks" Mobility   Outcome Measure  Help needed turning from your back to your side while in a flat bed without using bedrails?: A Little Help needed moving from lying on your back to sitting on the side of a flat bed without using bedrails?: A Lot Help needed moving to and from a bed to a chair (including a wheelchair)?: A Lot Help needed standing up from a chair using your arms (e.g., wheelchair or bedside chair)?: A Lot Help needed to walk in hospital room?: A Lot Help needed climbing 3-5 steps with a railing? : A Lot 6 Click Score: 13    End of Session Equipment Utilized During Treatment: Gait belt Activity Tolerance: Patient limited by fatigue;Patient tolerated treatment well Patient left: in bed;with call bell/phone within reach;with bed alarm set Nurse Communication: Mobility status PT Visit Diagnosis: Unsteadiness on feet (R26.81);Other abnormalities of gait and mobility (R26.89);Muscle weakness (generalized) (M62.81);History  of falling (Z91.81);Difficulty in walking, not elsewhere classified (R26.2);Other symptoms and signs  involving the nervous system (R29.898)     Time: 2500-3704 PT Time Calculation (min) (ACUTE ONLY): 15 min  Charges:  $Therapeutic Exercise: 8-22 mins                    Howard Allen, PTA, [facility] Acute Rehab Services Office- [phone] 02/25/20, 1:37 PM  Howard Allen 02/25/2020, 1:36 PM

## 2020-02-25 NOTE — Progress Notes (Signed)
Subjective: No acute events overnight.  No new concerns.  Patient appears pleasantly confused.  ROS: negative except above  Examination  Vital signs in last 24 hours: Temp:  [97.5 F (36.4 C)-99 F (37.2 C)] 97.9 F (36.6 C) (10/18 1206) Pulse Rate:  [51-63] 54 (10/18 1206) Resp:  [18-19] 18 (10/18 1206) BP: (119-142)/(68-81) 119/68 (10/18 1206) SpO2:  [98 %-100 %] 100 % (10/18 1206)  General: lying in bed, not in apparent distress CVS: pulse-normal rate and rhythm RS: breathing comfortably, CTA B Extremities: normal, warm  Neuro: MS: Alert, oriented to person, knew he is in a hospital, did not know time, follows simple one-step commands, able to name 2 out of 4 objects but then kept saying "I do not know" CN: pupils equal and reactive,  EOMI, face symmetric, tongue midline, normal sensation over face, Motor: 4/5 strength in all 4 extremities  Basic Metabolic Panel: Recent Labs  Lab 02/19/20 0641 02/19/20 1813 02/20/20 0449 02/20/20 0449 02/21/20 0432 02/21/20 0432 02/22/20 0804 02/22/20 0804 02/23/20 0341 02/24/20 0256 02/25/20 0216  NA 135  --  134*   < > 131*  --  136  --  135 132* 132*  K 3.8  --  3.4*   < > 3.8  --  3.9  --  4.2 4.3 4.1  CL 96*  --  99   < > 98  --  97*  --  103 104 106  CO2 24  --  24   < > 22  --  27  --  20* 20* 17*  GLUCOSE 93  --  97   < > 102*  --  119*  --  106* 83 100*  BUN 18  --  15   < > 14  --  15  --  19 23 25*  CREATININE 1.27*   < > 1.20   < > 1.16  --  1.38*  --  1.24 1.21 1.23  CALCIUM 9.4  --  8.8*   < > 8.9   < > 9.7   < > 9.3 8.8* 9.1  MG 1.6*  --  2.0  --   --   --   --   --  1.5* 1.8 1.7  PHOS 1.8*  --  3.0   < > 2.8  --  2.8  --  3.4 3.2 3.7   < > = values in this interval not displayed.    CBC: Recent Labs  Lab 02/21/20 0814  WBC 7.4  NEUTROABS 5.5  HGB 10.4*  HCT 32.6*  MCV 88.6  PLT 296     Coagulation Studies: No results for input(s): LABPROT, INR in the last 72 hours.  Imaging No new brain imaging  overnight  ASSESSMENT AND PLAN: 74 year old male who presented with new onset status epilepticus and was found to have meningitis.  New onset status epilepticus (resolved) Meningitis Acute encephalopathy, infectious -LP showed 140 WBCs with 64% neutrophils and 32% lymphocytes, protein 112 and glucose less than 20.Repeat LP on 02/22/2020 showed 180 WBCs with 58% neutrophils, 42% lymphocytes, glucose less than 21 and protein 140.  Recommendations -Please refer to ID note for infectious labs. Also added paraneoplastic panel to be sent to [facility] if CSF is left over. -Management per ID team. - Continue Keppra 1000 mg twice daily -Continue seizure precautions  -as needed IV Ativan 2 mg for clinical seizure-like activity -Recommend follow-up with neurology in 8 to 12 weeks.  Patient is originally from [place]  so therefore recommend follow-up in [place]. - MRI brain on 02/14/2020 showed signal abnormality in the left thalamus concerning for acute infarct versus seizure related.Will likely needMRI brain without contrast in 8 to 12 weeksto see if MRI changes resolved which would make it more likely that these were seizure related.  I have spent a total of58minuteswith the patient reviewing hospitalnotes, test results, labs and examining the patient as well as establishing an assessment and plan.>50% of time was spent in direct patient care.  Thank you for allowing [place] to precipitate in the care of this patient.  No further inpatient work-up from neurology standpoint.  Please call us if you have any further questions.   Zeb Comfort Epilepsy [facility] For questions after 5pm please refer to AMION to reach the Neurologist on call

## 2020-02-25 NOTE — Progress Notes (Signed)
Subjective:  Patient is more awake today than when I saw him on Friday but he does not have any complaints   Antibiotics:  Anti-infectives (From admission, onward)   Start     Dose/Rate Route Frequency Ordered Stop   02/23/20 0100  acyclovir (ZOVIRAX) 615 mg in dextrose 5 % 100 mL IVPB  Status:  Discontinued        10 mg/kg  61.3 kg 112.3 mL/hr over 60 Minutes Intravenous Every 12 hours 02/22/20 1537 02/24/20 0936   02/22/20 2100  amphotericin B liposome (AMBISOME) 310 mg in dextrose 5 % 500 mL IVPB        5 mg/kg  61.3 kg 288.8 mL/hr over 120 Minutes Intravenous Every 24 hours 02/22/20 1524     02/22/20 1800  rifampin (RIFADIN) capsule 600 mg        600 mg Oral Daily 02/22/20 1520     02/22/20 1800  isoniazid (NYDRAZID) tablet 300 mg        300 mg Oral Daily 02/22/20 1520     02/22/20 1800  pyrazinamide tablet 1,500 mg        1,500 mg Oral Daily 02/22/20 1520     02/22/20 1800  ethambutol (MYAMBUTOL) tablet 1,200 mg        1,200 mg Oral Daily 02/22/20 1520     02/20/20 1000  vancomycin (VANCOCIN) IVPB 1000 mg/200 mL premix  Status:  Discontinued        1,000 mg 200 mL/hr over 60 Minutes Intravenous Every 24 hours 02/20/20 0555 02/21/20 1259   02/19/20 1811  vancomycin variable dose per unstable renal function (pharmacist dosing)  Status:  Discontinued         Does not apply See admin instructions 02/19/20 1811 02/20/20 0555   02/18/20 0500  vancomycin (VANCOREADY) IVPB 750 mg/150 mL  Status:  Discontinued        750 mg 150 mL/hr over 60 Minutes Intravenous Every 12 hours 02/17/20 1651 02/19/20 1811   02/15/20 0300  vancomycin (VANCOREADY) IVPB 500 mg/100 mL        500 mg 100 mL/hr over 60 Minutes Intravenous Every 12 hours 02/14/20 1352 02/17/20 1755   02/14/20 1400  acyclovir (ZOVIRAX) 690 mg in dextrose 5 % 100 mL IVPB  Status:  Discontinued        10 mg/kg  68.9 kg 113.8 mL/hr over 60 Minutes Intravenous Every 12 hours 02/14/20 1245 02/22/20 1537   02/14/20  1400  vancomycin (VANCOREADY) IVPB 1250 mg/250 mL        1,250 mg 166.7 mL/hr over 90 Minutes Intravenous STAT 02/14/20 1352 02/14/20 1655   02/14/20 1300  cefTRIAXone (ROCEPHIN) 2 g in sodium chloride 0.9 % 100 mL IVPB  Status:  Discontinued        2 g 200 mL/hr over 30 Minutes Intravenous Every 12 hours 02/14/20 1245 02/20/20 1732   02/14/20 1300  ampicillin (OMNIPEN) 2 g in sodium chloride 0.9 % 100 mL IVPB  Status:  Discontinued        2 g 300 mL/hr over 20 Minutes Intravenous Every 6 hours 02/14/20 1245 02/18/20 1051      Medications: Scheduled Meds: . amLODipine  10 mg Oral Daily  . aspirin  81 mg Oral Daily  . atorvastatin  80 mg Oral Daily  . Chlorhexidine Gluconate Cloth  6 each Topical Daily  . clopidogrel  75 mg Oral Daily  . dexamethasone (DECADRON) injection  18 mg Intravenous Daily  .  dextrose  10 mL Intravenous Q24H  . dextrose  10 mL Intravenous Q24H  . docusate  100 mg Oral BID  . ethambutol  1,200 mg Oral Daily  . fenofibrate  160 mg Oral Daily  . heparin  5,000 Units Subcutaneous Q8H  . insulin aspart  0-9 Units Subcutaneous Q4H  . isoniazid  300 mg Oral Daily  . levETIRAcetam  1,000 mg Oral Q12H  . metoprolol succinate  100 mg Oral Daily  . multivitamin with minerals  1 tablet Oral Daily  . pantoprazole sodium  40 mg Oral Daily  . polyethylene glycol  17 g Oral Daily  . potassium chloride  40 mEq Oral Daily  . pyrazinamide  1,500 mg Oral Daily  . vitamin B-6  50 mg Oral Daily  . rifampin  600 mg Oral Daily  . sodium chloride  500 mL Intravenous Q24H  . sodium chloride  500 mL Intravenous Q24H  . vitamin B-12  1,000 mcg Oral Daily   Continuous Infusions: . sodium chloride Stopped (02/24/20 0116)  . sodium chloride Stopped (02/24/20 0218)  . amphotericin  B  Liposome (AMBISOME) ADULT IV Stopped (02/25/20 0042)  . magnesium sulfate bolus IVPB 4 g (02/25/20 1131)   PRN Meds:.sodium chloride, acetaminophen, albuterol, diphenhydrAMINE **OR**  diphenhydrAMINE, docusate, labetalol, meperidine (DEMEROL) injection, Resource ThickenUp Clear    Objective: Weight change:   Intake/Output Summary (Last 24 hours) at 02/25/2020 1228 Last data filed at 02/25/2020 0726 Gross per 24 hour  Intake 1235.56 ml  Output 1400 ml  Net -164.44 ml   Blood pressure 119/68, pulse (!) 54, temperature 97.9 F (36.6 C), resp. rate 18, height 5\' 10"  (1.778 m), weight 66.7 kg, SpO2 100 %. Temp:  [97.5 F (36.4 C)-99 F (37.2 C)] 97.9 F (36.6 C) (10/18 1206) Pulse Rate:  [51-63] 54 (10/18 1206) Resp:  [18-19] 18 (10/18 1206) BP: (119-142)/(68-81) 119/68 (10/18 1206) SpO2:  [98 %-100 %] 100 % (10/18 1206)  Physical Exam: General: Arousable and interactive.  He thinks he is in [place] CVS regular rate, normal  Chest: , no wheezing, no respiratory distress Abdomen: soft non-distended,  Extremities: no edema or deformity noted bilaterally Skin: no rashes Neuro: nonfocal  CBC:    BMET Recent Labs    02/24/20 0256 02/25/20 0216  NA 132* 132*  K 4.3 4.1  CL 104 106  CO2 20* 17*  GLUCOSE 83 100*  BUN 23 25*  CREATININE 1.21 1.23  CALCIUM 8.8* 9.1     Liver Panel  Recent Labs    02/23/20 0341  PROT 5.9*  ALBUMIN 3.2*  AST 22  ALT 18  ALKPHOS 36*  BILITOT 0.5       Sedimentation Rate No results for input(s): ESRSEDRATE in the last 72 hours. C-Reactive Protein No results for input(s): CRP in the last 72 hours.  Micro Results: Recent Results (from the past 720 hour(s))  Respiratory Panel by RT PCR (Flu A&B, Covid) - Nasopharyngeal Swab     Status: None   Collection Time: 02/13/20  4:17 PM   Specimen: Nasopharyngeal Swab  Result Value Ref Range Status   SARS Coronavirus 2 by RT PCR NEGATIVE NEGATIVE Final    Comment: (NOTE) SARS-CoV-2 target nucleic acids are NOT DETECTED.  The SARS-CoV-2 RNA is generally detectable in upper respiratoy specimens during the acute phase of infection. The lowest concentration of  SARS-CoV-2 viral copies this assay can detect is 131 copies/mL. A negative result does not preclude SARS-Cov-2 infection and should not be  used as the sole basis for treatment or other patient management decisions. A negative result may occur with  improper specimen collection/handling, submission of specimen other than nasopharyngeal swab, presence of viral mutation(s) within the areas targeted by this assay, and inadequate number of viral copies (<131 copies/mL). A negative result must be combined with clinical observations, patient history, and epidemiological information. The expected result is Negative.  Fact Sheet for Patients:  PinkCheek.be  Fact Sheet for Healthcare Providers:  GravelBags.it  This test is no t yet approved or cleared by the [place] FDA and  has been authorized for detection and/or diagnosis of SARS-CoV-2 by FDA under an Emergency Use Authorization (EUA). This EUA will remain  in effect (meaning this test can be used) for the duration of the COVID-19 declaration under Section 564(b)(1) of the Act, 21 U.S.C. section 360bbb-3(b)(1), unless the authorization is terminated or revoked sooner.     Influenza A by PCR NEGATIVE NEGATIVE Final   Influenza B by PCR NEGATIVE NEGATIVE Final    Comment: (NOTE) The Xpert Xpress SARS-CoV-2/FLU/RSV assay is intended as an aid in  the diagnosis of influenza from Nasopharyngeal swab specimens and  should not be used as a sole basis for treatment. Nasal washings and  aspirates are unacceptable for Xpert Xpress SARS-CoV-2/FLU/RSV  testing.  Fact Sheet for Patients: PinkCheek.be  Fact Sheet for Healthcare Providers: GravelBags.it  This test is not yet approved or cleared by the [place] FDA and  has been authorized for detection and/or diagnosis of SARS-CoV-2 by  FDA under an Emergency Use  Authorization (EUA). This EUA will remain  in effect (meaning this test can be used) for the duration of the  Covid-19 declaration under Section 564(b)(1) of the Act, 21  U.S.C. section 360bbb-3(b)(1), unless the authorization is  terminated or revoked. Performed at [facility], [place] [address], [place], [place] [address]   Culture, blood (Routine X 2) w Reflex to ID Panel     Status: None   Collection Time: 02/13/20  4:57 PM   Specimen: BLOOD LEFT FOREARM  Result Value Ref Range Status   Specimen Description BLOOD LEFT FOREARM  Final   Special Requests   Final    BOTTLES DRAWN AEROBIC AND ANAEROBIC Blood Culture results may not be optimal due to an excessive volume of blood received in culture bottles   Culture   Final    NO GROWTH 5 DAYS Performed at [facility], [place] [address], [place], [place] [address]    Report Status 02/18/2020 FINAL  Final  Culture, blood (Routine X 2) w Reflex to ID Panel     Status: None   Collection Time: 02/13/20  5:02 PM   Specimen: BLOOD LEFT WRIST  Result Value Ref Range Status   Specimen Description BLOOD LEFT WRIST  Final   Special Requests   Final    BOTTLES DRAWN AEROBIC AND ANAEROBIC Blood Culture results may not be optimal due to an excessive volume of blood received in culture bottles   Culture   Final    NO GROWTH 5 DAYS Performed at [facility], [place] [address] [place], [place] [address]    Report Status 02/18/2020 FINAL  Final  MRSA PCR Screening     Status: Abnormal   Collection Time: 02/14/20  3:03 AM   Specimen: Nasal Mucosa; Nasopharyngeal  Result Value Ref Range Status   MRSA by PCR POSITIVE (A) NEGATIVE Final    Comment:  The GeneXpert MRSA Assay (FDA approved for NASAL specimens only), is one component of a comprehensive MRSA colonization surveillance program. It is not intended to diagnose MRSA infection nor to guide or monitor treatment for MRSA infections. RESULT CALLED TO, READ BACK BY AND  VERIFIED WITH: Lillie Fragmin RN 8:25 02/14/20 ([email or web address]) Performed at [facility], [place] [address], [place], [place] [address]   CSF culture     Status: None   Collection Time: 02/14/20 11:01 AM   Specimen: CSF; Cerebrospinal Fluid  Result Value Ref Range Status   Specimen Description CSF  Final   Special Requests NONE  Final   Gram Stain   Final    WBC PRESENT,BOTH PMN AND MONONUCLEAR NO ORGANISMS SEEN CYTOSPIN SMEAR    Culture   Final    NO GROWTH 3 DAYS Performed at [facility], [place] [address], [place], [place] [address]    Report Status 02/17/2020 FINAL  Final  Acid Fast Smear (AFB)     Status: None   Collection Time: 02/17/20  1:11 PM   Specimen: PATH Cytology CSF  Result Value Ref Range Status   AFB Specimen Processing Concentration  Final   Acid Fast Smear Negative  Final    Comment: (NOTE) Performed At: [facility] [place], [place] [id number] Rush Farmer MD [id number]    Source (AFB) CSF  Final    Comment: Performed at [facility], [place] [address] [place], [place] [address]  Culture, fungus without smear     Status: None (Preliminary result)   Collection Time: 02/17/20  1:14 PM   Specimen: CSF; Cerebrospinal Fluid  Result Value Ref Range Status   Specimen Description CSF  Final   Special Requests NONE  Final   Culture   Final    NO FUNGUS ISOLATED AFTER 6 DAYS Performed at [facility], [place] [address], [place], [place] [address]    Report Status PENDING  Incomplete  Culture, fungus without smear     Status: None (Preliminary result)   Collection Time: 02/22/20 11:38 AM   Specimen: CSF; Other  Result Value Ref Range Status   Specimen Description CSF TUBE 2  Final   Special Requests Immunocompromised  Final   Culture   Final    CULTURE REINCUBATED FOR BETTER GROWTH Performed at [facility] [facility], [address], [place], [place] [address]    Report Status PENDING  Incomplete  Acid Fast Smear (AFB)      Status: None   Collection Time: 02/22/20 11:38 AM   Specimen: CSF; Cerebrospinal Fluid  Result Value Ref Range Status   AFB Specimen Processing Concentration  Final   Acid Fast Smear Negative  Final    Comment: (NOTE) Performed At: [facility] [place], [place] [id number] Rush Farmer MD DD:[id number]    Source (AFB) CSF  Final    Comment: TUBE 2 Performed at [facility], [address], [place], [place] [address]     Studies/Results: No results found.    Assessment/Plan:  INTERVAL HISTORY:   Started amphotericin along with RI PE plus corticosteroids after his last lumbar puncture  Repeat HSV PCR's were negative and acyclovir was stopped  Principal Problem:   Aseptic meningitis Active Problems:   Seizure ([place])   Weight loss   Encounter for nasogastric (NG) tube placement    Howard Allen is a 74 y.o. male with  Hx of weight loss, treatment for  Crohn's disease admitted with seizures and aseptic meninigoencephalitis  #1 Aseptic meningoencephalitis  HSV 1 and 2 PCR's were negative x2 CSF cultures originally taken were without growth.  Blastomyces antigen in urine is negative  He has multiple studies pending including:  AFB culture [place] (AFB stains were negative x2 Fungal culture--note I have asked him to incubate fungal cultures for 6 weeks  CSF for MTB PCR  CSF for Coccidioides antibiotic bodies, CSF Blastomyces antigen CSF histoplasma antigen, MTB PCR CSF for paraneoplastic panel per Neurology  Start amphotericin for coverage of dimorphic fungi and 4 drug therapy for CNS TB + steroids     LOS: 12 days   Alcide Evener 02/25/2020, 12:28 PM

## 2020-02-25 NOTE — Progress Notes (Signed)
PROGRESS NOTE    Howard Allen  FGH:[id number] DOB: 1945-05-24 DOA: 02/13/2020 PCP: System, Provider Not In   Brief Narrative: The patient is a 74 year old Caucasian male with a past medical history significant for Crohn's disease, hypertension, hyperlipidemia, depression history of hospitalization in June 2021 for pneumatosis status post ileocolonic resection and lysis of adhesions who presented with multiple tonic-clonic seizures complicated by status epilepticus that required intubation and loading with Keppra. CT of the head was negative and initially when he was brought in to the ED by EMS there was at least 3 witnessed generalized tonic-clonic seizures noted. PCCM was consulted for admission given that he was intubated on arrival and unresponsive. He was intubated on 02/13/2020 and extubated 02/15/2020. Neurology was consulted and he underwent further work-up and was found to have a signal abnormality in the left limb is most consistent with an acute infarct. Echocardiogram done showed an EF of 55 to 60% with moderate LVH and grade 1 diastolic dysfunction. EEG testing on 10/9 was negative. 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 He underwent several viral serologies and had a lumbar puncture sent on 02/14/2020. He was started on IV vancomycin, IV ceftriaxone, IV ampicillin and IV acyclovir for suspected meningitis. CSF analysis was consistent with aseptic meningitis with high white blood cells and very low glucose level. CSF culture was negative for anything and everything. He was transferred to [facility] service on 02/19/2020 and remained confused. Subsequently ID was consulted and they were suspecting aseptic meningoencephalitis and recommended large volume LP. Patient was already on aspirin and Plavix for past 5 days. His Plavix was stopped on 02/20/2020. P2 Y 12 was checked and based on that, neurology performed another large-volume LP on 02/22/2020 and several labs were  sent by ID. 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   Assessment & Plan:   Principal Problem:   Aseptic meningitis Active Problems:   Seizure ([place])   Weight loss   Encounter for nasogastric (NG) tube placement  Aseptic meningitis  - LP performed on 10/7 consistent with meningitis - Cultures are NGTD at 3 Days; HSV with positive IgG... pcr negative. - VDLR and cryptococcal antigen negative., and [place] serology pending - Quantiferon gold was indeterminate.  Patient was on vancomycin, ceftriaxone and acyclovir.  Consulted ID on 02/20/2020.  Dr. Tommy Medal recommended repeat large-volume LP to send more labs.  Unfortunately, patient was already on Plavix which I discontinued on 02/20/2020.  Rocephin and vancomycin discontinued but acyclovir continues.  Had a long multidisciplinary discussion between me, Dr. Tommy Medal and Dr. Hortense Ramal of neurology on 02/21/2020.  Checked P2 Y12 however it was too low.  Not safe to do LP according to neurology.  Neurology to assess timing of LP.  Defer to ID about starting empiric antibiotics or waiting until LP done.  Plavix discontinued on 02/20/2020. Patient finally underwent large-volume LP by neurology on 02/22/2020. 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  HCV PCR once again negative.  Acyclovir discontinued on 02/24/2020 but he remains on rest of the anti-TB and antifungal medications.  Patient has waxing and waning mental status.  He was alert and oriented x1 today whereas he was alert and oriented x2 yesterday.  Large hematoma above right eye: He developed large hematoma on the morning of 02/22/2020  after he is continued to pluck his right forehead laceration. Due to the size, he was unable to open his eye but there was no pressure on the eyeball. For this reason, I  had consulted ENT and he was evaluated by them but they did not feel the need to evacuate hematoma.  Patient's hematoma is decreasing in size and he is able to open about 80% of his right eye.  No more bleeding.  Acute Ischemic Infarct  -Seen on MRI imaging on 10/7: left thalamic signal abnormality measuring 2.3 cm most consistent with acute infarct; no edema or hemorrhagic conversion. 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 2 mm left paraophthalmic ICA aneurysm. Widely patent cervical carotid and vertebral arteries. Right upper lobe pulmonary nodules measuring up to 5 mm. Aortic Atherosclerosis (ICD10-I70.0) and Emphysema." -  now on atorvastatin 80 mg despite that he takes 20 at home given his acute CVA - TTE negative for vegetations or thrombosis.  - Continue high intensity statin; Lipid panel done and showed a total cholesterol/HDL ratio 3.3, cholesterol level 154, HDL level 46, LDL 79, triglycerides 146, VLDL 29 -PT OT recommending CIR next-appreciate neurology further evaluation recommendations.  He remained on aspirin and Plavix was resumed on 02/24/2020 after I had discussion with neurologist.  Tonic-Clonic Seizures Status Epilepticus: Resolved - Secondary to meningitis  - Neurology on board; appreciate their recommendations Continue Keppra.  Hypomagnesemia Resolved.  Hypophosphatemia Resolved.  B12 Deficiency  Moderate Malnutrition - Likely due to malnutrition and anorexia, possibly worsened by malabsorption given bowel inflammation seen on imaging the past several months prior to resection. Of note, 70 lb weight loss in the past 10 months.  - Consult to dietician for tube feed initiation, he was started on tube feedings which was discontinued now that he is tolerating  dysphagia 2 diet. Nutritionist now recommending hormonal shake twice daily as well as Magic cup twice daily with meals and multivitamin with minerals daily.  HTN/HLD -Home medications include Toprol 100 mg, Amlodipine 5 mg, Lipitor 20 mg, Fenofibrate 160 mg.  Blood pressure very well controlled.  Will continue amlodipine to 10 mg and Toprol-XL 100 mg p.o. daily.   Normocytic Anemia  Hx of Iron Deficiency Anemia B12 Deficiency  - B12 levels low. Iron panel shows low saturation, however normal iron and TIBC, unclear picture as not consistent completely with iron deficiency anemia or anemia of chronic disease.  Hemoglobin is stable.  ? Crohn's Disease s/p Ileocolic Resection  - No longer on immunosuppressive therapy. -Takes budesonide as an outpatient but this has not been resumed yet.  AKI on CKD stage II Back to normal.  DVT prophylaxis: SCDs Heparin 5000 units subcu every 8 hours Code Status: FULL CODE  Family Communication: No family present at bedside.  Daughter Threasa Beards updated yesterday. Disposition Plan: Plan to discharge to CIR once cleared by ID and neurology.  Status is: Inpatient  Remains inpatient appropriate because:Altered mental status, Unsafe d/c plan, IV treatments appropriate due to intensity of illness or inability to take PO and Inpatient level of care appropriate due to severity of illness  Dispo: The patient is from: Home              Anticipated d/c is to: CIR              Anticipated d/c date  is:> 3 days              Patient currently is not medically stable to d/c.  Consultants:   Neurology  PCCM Transfer   Procedures:  10/6 >> ETT placed 10/7 >> LP 10/8 >> Extubated   Significant Diagnostic Tests:  10/6 CT Head >> No acute intracranial abnormalities; age-related volume loss and chronic small vessel disease 10/7 MRI brain: 1. Signal abnormality in the left thalamus most consistent with an acute infarct. 2. Signal abnormality in the mesial left  temporal lobe and splenium of the corpus callosum which may reflect acute infarct, sequelae of recent seizure activity, or encephalitis (including herpes encephalitis). No contralateral cerebral involvement. 3. Mild chronic small vessel ischemic disease. 10/8 echo: LVEF 55-60%, mod LVH, Grade I diastolic dysfunction 93/2 CT Head >> Progressive low density in the left thalamus related to acute infarction; no hemorrhage 10/9 EEG >> Negative  Micro Data:  10/6 COVID-19 + influenza >> Negative  10/6 Blood Cultures >> NGTD 10/7 CSF Culture >> NGTD 10/7 HSV serology >> 2.02 on igg testing. Will send pcr to determine past or active.  10/7 [place] serology >>  10/7 VRDL serology >> 10/8 Quanitferon Gold  >> indeterminant 10/9 acid fast csf:  10/11 HSV PCR: pending   Antimicrobials:  Anti-infectives (From admission, onward)   Start     Dose/Rate Route Frequency Ordered Stop   02/23/20 0100  acyclovir (ZOVIRAX) 615 mg in dextrose 5 % 100 mL IVPB  Status:  Discontinued        10 mg/kg  61.3 kg 112.3 mL/hr over 60 Minutes Intravenous Every 12 hours 02/22/20 1537 02/24/20 0936   02/22/20 2100  amphotericin B liposome (AMBISOME) 310 mg in dextrose 5 % 500 mL IVPB        5 mg/kg  61.3 kg 288.8 mL/hr over 120 Minutes Intravenous Every 24 hours 02/22/20 1524     02/22/20 1800  rifampin (RIFADIN) capsule 600 mg        600 mg Oral Daily 02/22/20 1520     02/22/20 1800  isoniazid (NYDRAZID) tablet 300 mg        300 mg Oral Daily 02/22/20 1520     02/22/20 1800  pyrazinamide tablet 1,500 mg        1,500 mg Oral Daily 02/22/20 1520     02/22/20 1800  ethambutol (MYAMBUTOL) tablet 1,200 mg        1,200 mg Oral Daily 02/22/20 1520     02/20/20 1000  vancomycin (VANCOCIN) IVPB 1000 mg/200 mL premix  Status:  Discontinued        1,000 mg 200 mL/hr over 60 Minutes Intravenous Every 24 hours 02/20/20 0555 02/21/20 1259   02/19/20 1811  vancomycin variable dose per unstable renal function  (pharmacist dosing)  Status:  Discontinued         Does not apply See admin instructions 02/19/20 1811 02/20/20 0555   02/18/20 0500  vancomycin (VANCOREADY) IVPB 750 mg/150 mL  Status:  Discontinued        750 mg 150 mL/hr over 60 Minutes Intravenous Every 12 hours 02/17/20 1651 02/19/20 1811   02/15/20 0300  vancomycin (VANCOREADY) IVPB 500 mg/100 mL        500 mg 100 mL/hr over 60 Minutes Intravenous Every 12 hours 02/14/20 1352 02/17/20 1755   02/14/20 1400  acyclovir (ZOVIRAX) 690 mg in dextrose 5 % 100 mL IVPB  Status:  Discontinued        10  mg/kg  68.9 kg 113.8 mL/hr over 60 Minutes Intravenous Every 12 hours 02/14/20 1245 02/22/20 1537   02/14/20 1400  vancomycin (VANCOREADY) IVPB 1250 mg/250 mL        1,250 mg 166.7 mL/hr over 90 Minutes Intravenous STAT 02/14/20 1352 02/14/20 1655   02/14/20 1300  cefTRIAXone (ROCEPHIN) 2 g in sodium chloride 0.9 % 100 mL IVPB  Status:  Discontinued        2 g 200 mL/hr over 30 Minutes Intravenous Every 12 hours 02/14/20 1245 02/20/20 1732   02/14/20 1300  ampicillin (OMNIPEN) 2 g in sodium chloride 0.9 % 100 mL IVPB  Status:  Discontinued        2 g 300 mL/hr over 20 Minutes Intravenous Every 6 hours 02/14/20 1245 02/18/20 1051       Subjective: Patient seen and examined.  He remains alert.  He knows he is in the hospital.  Could not answer me more today.  Denied having any complaint.  Objective: Vitals:   02/24/20 2021 02/24/20 2323 02/25/20 0351 02/25/20 0731  BP: 136/75 139/81 121/74 124/74  Pulse: (!) 52 (!) 51 (!) 54 (!) 56  Resp: 19 18 19 18   Temp: (!) 97.5 F (36.4 C) (!) 97.5 F (36.4 C) 99 F (37.2 C) 98 F (36.7 C)  TempSrc: Oral Oral Oral Oral  SpO2: 100% 100% 99% 100%  Weight:      Height:        Intake/Output Summary (Last 24 hours) at 02/25/2020 1028 Last data filed at 02/25/2020 0726 Gross per 24 hour  Intake 1235.56 ml  Output 1400 ml  Net -164.44 ml   Filed Weights   02/20/20 0211 02/21/20 0322 02/24/20  0415  Weight: 60.8 kg 61.3 kg 66.7 kg   Examination: Physical Exam:  General exam: Appears calm and comfortable  Respiratory system: Clear to auscultation. Respiratory effort normal. Cardiovascular system: S1 & S2 heard, RRR. No JVD, murmurs, rubs, gallops or clicks. No pedal edema. Gastrointestinal system: Abdomen is nondistended, soft and nontender. No organomegaly or masses felt. Normal bowel sounds heard. Central nervous system: Alert and oriented. No focal neurological deficits. Extremities: Symmetric 5 x 5 power. Skin: Hematoma over right eyebrow  Data Reviewed: I have personally reviewed following labs and imaging studies  CBC: Recent Labs  Lab 02/21/20 0814  WBC 7.4  NEUTROABS 5.5  HGB 10.4*  HCT 32.6*  MCV 88.6  PLT 330   Basic Metabolic Panel: Recent Labs  Lab 02/19/20 0641 02/19/20 1813 02/20/20 0449 02/20/20 0449 02/21/20 0432 02/22/20 0804 02/23/20 0341 02/24/20 0256 02/25/20 0216  NA 135  --  134*   < > 131* 136 135 132* 132*  K 3.8  --  3.4*   < > 3.8 3.9 4.2 4.3 4.1  CL 96*  --  99   < > 98 97* 103 104 106  CO2 24  --  24   < > 22 27 20* 20* 17*  GLUCOSE 93  --  97   < > 102* 119* 106* 83 100*  BUN 18  --  15   < > 14 15 19 23  25*  CREATININE 1.27*   < > 1.20   < > 1.16 1.38* 1.24 1.21 1.23  CALCIUM 9.4  --  8.8*   < > 8.9 9.7 9.3 8.8* 9.1  MG 1.6*  --  2.0  --   --   --  1.5* 1.8 1.7  PHOS 1.8*  --  3.0   < >  2.8 2.8 3.4 3.2 3.7   < > = values in this interval not displayed.   GFR: Estimated Creatinine Clearance: 51.2 mL/min (by C-G formula based on SCr of 1.23 mg/dL). Liver Function Tests: Recent Labs  Lab 02/23/20 0341  AST 22  ALT 18  ALKPHOS 36*  BILITOT 0.5  PROT 5.9*  ALBUMIN 3.2*   No results for input(s): LIPASE, AMYLASE in the last 168 hours. No results for input(s): AMMONIA in the last 168 hours. Coagulation Profile: No results for input(s): INR, PROTIME in the last 168 hours. Cardiac Enzymes: No results for input(s):  CKTOTAL, CKMB, CKMBINDEX, TROPONINI in the last 168 hours. BNP (last 3 results) No results for input(s): PROBNP in the last 8760 hours. HbA1C: No results for input(s): HGBA1C in the last 72 hours. CBG: Recent Labs  Lab 02/24/20 1547 02/24/20 1948 02/25/20 0000 02/25/20 0358 02/25/20 0729  GLUCAP 101* 86 136* 89 85   Lipid Profile: No results for input(s): CHOL, HDL, LDLCALC, TRIG, CHOLHDL, LDLDIRECT in the last 72 hours. Thyroid Function Tests: No results for input(s): TSH, T4TOTAL, FREET4, T3FREE, THYROIDAB in the last 72 hours. Anemia Panel: No results for input(s): VITAMINB12, FOLATE, FERRITIN, TIBC, IRON, RETICCTPCT in the last 72 hours. Sepsis Labs: No results for input(s): PROCALCITON, LATICACIDVEN in the last 168 hours.  Recent Results (from the past 240 hour(s))  Acid Fast Smear (AFB)     Status: None   Collection Time: 02/17/20  1:11 PM   Specimen: PATH Cytology CSF  Result Value Ref Range Status   AFB Specimen Processing Concentration  Final   Acid Fast Smear Negative  Final    Comment: (NOTE) Performed At: [facility] [place], [place] [id number] Rush Farmer MD WU:[id number]    Source (AFB) CSF  Final    Comment: Performed at [facility], [place] [address], [place], [place] [address]  Culture, fungus without smear     Status: None (Preliminary result)   Collection Time: 02/17/20  1:14 PM   Specimen: CSF; Cerebrospinal Fluid  Result Value Ref Range Status   Specimen Description CSF  Final   Special Requests NONE  Final   Culture   Final    NO FUNGUS ISOLATED AFTER 6 DAYS Performed at [facility], [place] [address], [place], [place] [address]    Report Status PENDING  Incomplete  Culture, fungus without smear     Status: None (Preliminary result)   Collection Time: 02/22/20 11:38 AM   Specimen: CSF; Other  Result Value Ref Range Status   Specimen Description CSF TUBE 2  Final   Special Requests Immunocompromised  Final     Culture   Final    NO FUNGUS ISOLATED AFTER 3 DAYS Performed at [facility], [address] [place], [place] [address]    Report Status PENDING  Incomplete  Acid Fast Smear (AFB)     Status: None   Collection Time: 02/22/20 11:38 AM   Specimen: CSF; Cerebrospinal Fluid  Result Value Ref Range Status   AFB Specimen Processing Concentration  Final   Acid Fast Smear Negative  Final    Comment: (NOTE) Performed At: [facility] [place], [place] [id number] Rush Farmer MD KG:[id number]    Source (AFB) CSF  Final    Comment: TUBE 2 Performed at [facility], [address] [place],  [address]      RN Pressure Injury Documentation:     Estimated  body mass index is 21.1 kg/m as calculated from the following:   Height as of this encounter: 5\' 10"  (1.778 m).   Weight as of this encounter: 66.7 kg.  Malnutrition Type:  Nutrition Problem: Moderate Malnutrition   Malnutrition Characteristics:  Signs/Symptoms: mild fat depletion, moderate fat depletion, mild muscle depletion, moderate muscle depletion   Nutrition Interventions:  Interventions: Hormel Shake, Magic cup, MVI   Radiology Studies: No results found. Scheduled Meds: . amLODipine  10 mg Oral Daily  . aspirin  81 mg Oral Daily  . atorvastatin  80 mg Oral Daily  . Chlorhexidine Gluconate Cloth  6 each Topical Daily  . clopidogrel  75 mg Oral Daily  . dexamethasone (DECADRON) injection  18 mg Intravenous Daily  . dextrose  10 mL Intravenous Q24H  . dextrose  10 mL Intravenous Q24H  . docusate  100 mg Oral BID  . ethambutol  1,200 mg Oral Daily  . fenofibrate  160 mg Oral Daily  . heparin  5,000 Units Subcutaneous Q8H  . insulin aspart  0-9 Units Subcutaneous Q4H  . isoniazid  300 mg Oral Daily  . levETIRAcetam  1,000 mg Oral Q12H  . metoprolol succinate  100 mg Oral Daily  . multivitamin with minerals  1 tablet Oral Daily  . pantoprazole sodium  40 mg Oral Daily   . polyethylene glycol  17 g Oral Daily  . potassium chloride  40 mEq Oral Daily  . pyrazinamide  1,500 mg Oral Daily  . vitamin B-6  50 mg Oral Daily  . rifampin  600 mg Oral Daily  . sodium chloride  500 mL Intravenous Q24H  . sodium chloride  500 mL Intravenous Q24H  . vitamin B-12  1,000 mcg Oral Daily   Continuous Infusions: . sodium chloride Stopped (02/24/20 0116)  . sodium chloride Stopped (02/24/20 0218)  . amphotericin  B  Liposome (AMBISOME) ADULT IV Stopped (02/25/20 0042)    LOS: 12 days   Darliss Cheney, MD [facility] PAGER is on AMION  If 7PM-7AM, please contact night-coverage [email or web address]

## 2020-02-25 NOTE — Progress Notes (Signed)
  Speech Language Pathology Treatment: Dysphagia  Patient Details Name: Howard Allen MRN: [id number] DOB: 23-Aug-1945 Today's Date: 02/25/2020 Time: 2536-6440 SLP Time Calculation (min) (ACUTE ONLY): 14 min  Assessment / Plan / Recommendation Clinical Impression  Pt is more alert today but not very conversant. He had better labial seal and sucking to obtain thickened liquids via straw with SLP actually having to provide physical assist with straw removal to limit bolus size. Mastication is prolonged with small bites of advanced solids but minimal oral residue is present. Second swallows are often noted, perhaps indicative of pharyngeal residue that was noted during MBS. Pt was able to perform CTAR and effortful swallows today with Mod cues. Also tried to get him to practice using a brief oral hold prior to swallowing, to see if this strategy could be attempted on repeat MBS given timing deficits noted on last one. Pt will definitely need repeat MBS prior to discharge given level of modifications on current diet. Liquid advancement could also open up the opportunity for additional nutritional supplements. Will f/u for best timing of repeat MBS - likely as his mentation remains more alert.    HPI HPI: 74 y.o. 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        SLP Plan  Continue with current plan of care       Recommendations  Diet recommendations: Dysphagia 2 (fine chop);Honey-thick liquid Liquids provided via: Cup;Straw Medication Administration: Crushed with puree Supervision: Patient able to self feed;Full supervision/cueing for compensatory strategies Compensations: Slow  rate;Small sips/bites;Lingual sweep for clearance of pocketing;Clear throat intermittently Postural Changes and/or Swallow Maneuvers: Seated upright 90 degrees                Oral Care Recommendations: Oral care BID Follow up Recommendations: Skilled Nursing facility SLP Visit Diagnosis: Dysphagia, oropharyngeal phase (R13.12) Plan: Continue with current plan of care       GO                Osie Bond., M.A. [facility] Pager [phone] Office [phone]  02/25/2020, 3:52 PM

## 2020-02-25 NOTE — Progress Notes (Signed)
Pharmacy Antibiotic Note  Howard Allen is a 74 y.o. 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 Repeat CSF studies pending. So far no organism has been identified as a cause.  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 Current SCr is stable at 1.23. Will provide pre and post hydration of amphotericin B with 500 mL NS.   Potassium remains stable (4.1 today) and patient is currently on a daily potassium supplement. Mg today is 1.7 and is being replaced.   Plan: Amphotericin B 310 mg (~ 5 mg/kg) every 24 hours  Give 500 mL of normal saline 1 hour prior to dose Flush line with dextrose in-between normal saline and amphotericin B doses Give another 500 mL normal saline after amphotericin B infusion is complete  Administration instructions reviewed with RN Daily K supplement Daily Mg as needed - receiving 4g today F/u daily K and Mg   Rifampin 600 mg daily Isoniazid 300 mg daily Pyridoxine 50 mg daily Pyrazinamide 1500 mg daily Ethambutol 1200 mg daily Dexamethasone IV 0.3 mg/kg (~18 mg) daily for 2 weeks- Will taper as appropriate pending results of CSF    Height: 5\' 10"  (177.8 cm) Weight: 66.7 kg (147 lb 0.8 oz) IBW/kg (Calculated) : 73  Temp (24hrs), Avg:98.1 F (36.7 C), Min:97.5 F (36.4 C), Max:99 F (37.2 C)  Recent Labs  Lab 02/19/20 1645 02/19/20 1813 02/20/20 0449 02/20/20 0449 02/21/20 0432 02/21/20 0814 02/22/20 0804 02/23/20 0341 02/24/20 0256 02/25/20 0216  WBC  --   --   --   --   --  7.4  --   --   --   --   CREATININE  --    < > 1.20   < > 1.16  --  1.38* 1.24 1.21 1.23  VANCOTROUGH 26*  --   --   --   --   --   --   --   --   --   VANCORANDOM  --   --  16  --   --   --   --   --   --   --    < > = values in this interval not displayed.    Estimated Creatinine Clearance: 51.2 mL/min (by C-G formula based on SCr of 1.23 mg/dL).    Allergies  Allergen Reactions   . Lisinopril Anaphylaxis, Cough and Other (See Comments)    Other reaction(s): anaphylaxis Pt states it made him "cough his head off"   . Lorazepam     Other reaction(s): Hallucinations     Dimple Nanas, PharmD PGY-1 Acute Care Pharmacy Resident Office: [phone] 02/25/2020 12:34 PM

## 2020-02-25 NOTE — Progress Notes (Signed)
Nutrition Follow-up  DOCUMENTATION CODES:   Not applicable  INTERVENTION:  -d/c Hormel shake as it isn't the appropriate consistency (it is nectar thick, not honey thick) -Magic cup TID with meals, each supplement provides 290 kcal and 9 grams of protein -Continue MVI daily   If pt's PO intake does not improve, recommend re-initiation of enteral nutrition to supplement po diet to help pt meet kcal/protein needs.  NUTRITION DIAGNOSIS:   Moderate Malnutrition related to   as evidenced by mild fat depletion, moderate fat depletion, mild muscle depletion, moderate muscle depletion.  Ongoing  GOAL:   Patient will meet greater than or equal to 90% of their needs  Progressing  MONITOR:   PO intake, Supplement acceptance, Labs, Weight trends  REASON FOR ASSESSMENT:   Consult Assessment of nutrition requirement/status (Weight in January 2021 was 196 lbs. Gradual weight loss over the last 10 months due to lack of appetite.)  ASSESSMENT:   74 yo male presents with 1 week of falls, hallucinations and admitted with recurrent seizures and status epilepticus requiring intubation. PMH includes hx of Crohn's, prior SBO, HTN, HLD, depression  10/7 LP consistent with meningitis 10/8 extubated 10/11 diet advanced to dysphagia 2 with honey thick liquids  10/15 large volume LP, results pending  Pt's appetite has been decreasing since the last RD assessment. Pt has consumed 15-90% of the last 8 recorded meals (51% average meal intake). Pt currently with orders for Hormel shakes TID and Magic Cup BID. Note that pt is supposed to be receiving honey thick liquids. Hormel shakes are only nectar thick, so will d/c this supplement. Will increase Magic Cup to TID as [employer] is the only honey thick supplement available on the formulary.   If pt's PO intake does not improve, recommend re-initiation of enteral nutrition to help pt meet calorie/protein needs.   Admit wt: 68.9 kg Current wt: 66.7  kg  UOP: 1445ml x24 hours I/O: +8202.42ml since admit  Labs: Na 132 (L), CBGs 136-89-85 Medications: Decadron, Colace, ss Novolog, MVI, Protonix, Miralax, Klor-con, Vitamin B6, Vitamin B12  Diet Order:   Diet Order            DIET DYS 2 Room service appropriate? Yes with Assist; Fluid consistency: Honey Thick  Diet effective now                 EDUCATION NEEDS:   No education needs have been identified at this time  Skin:  Skin Assessment: Skin Integrity Issues: Skin Integrity Issues:: Other (Comment) Other: skin tear L arm  Last BM:  10/17 type 7  Height:   Ht Readings from Last 1 Encounters:  02/13/20 5\' 10"  (1.778 m)    Weight:   Wt Readings from Last 1 Encounters:  02/24/20 66.7 kg    BMI:  Body mass index is 21.1 kg/m.  Estimated Nutritional Needs:   Kcal:  1785-1965 kcal  Protein:  90-100 grams  Fluid:  >/= 1.8 L    Larkin Ina, MS, RD, LDN RD pager number and weekend/on-call pager number located in [place].

## 2020-02-26 ENCOUNTER — Inpatient Hospital Stay (HOSPITAL_COMMUNITY): Payer: Medicare Other

## 2020-02-26 DIAGNOSIS — E876 Hypokalemia: Secondary | ICD-10-CM | POA: Diagnosis not present

## 2020-02-26 DIAGNOSIS — R569 Unspecified convulsions: Secondary | ICD-10-CM | POA: Diagnosis not present

## 2020-02-26 DIAGNOSIS — G030 Nonpyogenic meningitis: Secondary | ICD-10-CM | POA: Diagnosis not present

## 2020-02-26 DIAGNOSIS — G40901 Epilepsy, unspecified, not intractable, with status epilepticus: Secondary | ICD-10-CM | POA: Diagnosis not present

## 2020-02-26 LAB — MAGNESIUM: Magnesium: 2.4 mg/dL (ref 1.7–2.4)

## 2020-02-26 LAB — BASIC METABOLIC PANEL
Anion gap: 11 (ref 5–15)
BUN: 30 mg/dL — ABNORMAL HIGH (ref 8–23)
CO2: 19 mmol/L — ABNORMAL LOW (ref 22–32)
Calcium: 9.3 mg/dL (ref 8.9–10.3)
Chloride: 100 mmol/L (ref 98–111)
Creatinine, Ser: 1.35 mg/dL — ABNORMAL HIGH (ref 0.61–1.24)
GFR, Estimated: 52 mL/min — ABNORMAL LOW (ref >60)
Glucose, Bld: 88 mg/dL (ref 70–99)
Potassium: 3.9 mmol/L (ref 3.5–5.1)
Sodium: 130 mmol/L — ABNORMAL LOW (ref 135–145)

## 2020-02-26 LAB — GLUCOSE, CAPILLARY
Glucose-Capillary: 86 mg/dL (ref 70–99)
Glucose-Capillary: 70 mg/dL (ref 70–99)
Glucose-Capillary: 111 mg/dL — ABNORMAL HIGH (ref 70–99)
Glucose-Capillary: 108 mg/dL — ABNORMAL HIGH (ref 70–99)
Glucose-Capillary: 91 mg/dL (ref 70–99)
Glucose-Capillary: 100 mg/dL — ABNORMAL HIGH (ref 70–99)

## 2020-02-26 LAB — PHOSPHORUS: Phosphorus: 5.8 mg/dL — ABNORMAL HIGH (ref 2.5–4.6)

## 2020-02-26 MED ORDER — SODIUM CHLORIDE 0.9 % IV SOLN: INTRAVENOUS | Status: DC

## 2020-02-26 MED ORDER — SODIUM CHLORIDE 0.9 % IV SOLN
600.0000 mg | INTRAVENOUS | Status: DC
  Administered 2020-02-26 – 2020-03-03 (×7): 600 mg via INTRAVENOUS
  Filled 2020-02-26 (×7): qty 600

## 2020-02-26 MED ORDER — GADOBUTROL 1 MMOL/ML IV SOLN
6.0000 mL | Freq: Once | INTRAVENOUS | Status: AC | PRN
  Administered 2020-02-26: 6 mL via INTRAVENOUS

## 2020-02-26 MED ORDER — METOPROLOL SUCCINATE ER 50 MG PO TB24
50.0000 mg | ORAL_TABLET | Freq: Every day | ORAL | Status: DC
  Administered 2020-02-28 – 2020-03-12 (×14): 50 mg via ORAL
  Filled 2020-02-26 (×17): qty 1

## 2020-02-26 NOTE — Progress Notes (Addendum)
PROGRESS NOTE    Howard Allen  RSW:[id number] DOB: 03-02-1946 DOA: 02/13/2020 PCP: System, Provider Not In   Brief Narrative: The patient is a 74 year old Caucasian male with a past medical history significant for Crohn's disease, hypertension, hyperlipidemia, depression history of hospitalization in June 2021 for pneumatosis status post ileocolonic resection and lysis of adhesions who presented with multiple tonic-clonic seizures complicated by status epilepticus that required intubation and loading with Keppra. CT of the head was negative and initially when he was brought in to the ED by EMS there was at least 3 witnessed generalized tonic-clonic seizures noted. PCCM was consulted for admission given that he was intubated on arrival and unresponsive. He was intubated on 02/13/2020 and extubated 02/15/2020. Neurology was consulted and he underwent further work-up and was found to have a signal abnormality in the left limb is most consistent with an acute infarct. Echocardiogram done showed an EF of 55 to 60% with moderate LVH and grade 1 diastolic dysfunction. EEG testing on 10/9 was negative. 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 He underwent several viral serologies and had a lumbar puncture sent on 02/14/2020. He was started on IV vancomycin, IV ceftriaxone, IV ampicillin and IV acyclovir for suspected meningitis. CSF analysis was consistent with aseptic meningitis with high white blood cells and very low glucose level. CSF culture was negative for anything and everything. He was transferred to [facility] service on 02/19/2020 and remained confused. Subsequently ID was consulted and they were suspecting aseptic meningoencephalitis and recommended large volume LP. Patient was already on aspirin and Plavix for past 5 days. His Plavix was stopped on 02/20/2020. P2 Y 12 was checked and based on that, neurology performed another large-volume LP on 02/22/2020 and several labs were  sent by ID. 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   Assessment & Plan:   Principal Problem:   Aseptic meningitis Active Problems:   Seizure ([place])   Weight loss   Encounter for nasogastric (NG) tube placement  Aseptic meningitis  - LP performed on 10/7 consistent with meningitis - Cultures are NGTD at 3 Days; HSV with positive IgG... pcr negative. - VDLR and cryptococcal antigen negative., and [place] serology pending - Quantiferon gold was indeterminate.  Patient was on vancomycin, ceftriaxone and acyclovir.  Consulted ID on 02/20/2020.  Dr. Tommy Medal recommended repeat large-volume LP to send more labs.  Unfortunately, patient was already on Plavix which I discontinued on 02/20/2020.  Rocephin and vancomycin discontinued but acyclovir continues.  Had a long multidisciplinary discussion between me, Dr. Tommy Medal and Dr. Hortense Ramal of neurology on 02/21/2020.  Checked P2 Y12 however it was too low.  Not safe to do LP according to neurology.  Neurology to assess timing of LP.  Defer to ID about starting empiric antibiotics or waiting until LP done.  Plavix discontinued on 02/20/2020. Patient finally underwent large-volume LP by neurology on 02/22/2020. 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  HCV PCR once again negative.  Acyclovir discontinued on 02/24/2020 but he remains on rest of the anti-TB and antifungal medications.  Patient has waxing and waning mental status.  He was alert and oriented x2 today.  Alertness is improving but orientation is waxing and waning.  Patient's Plavix was resumed on 1017 2021/48 hours after last LP after  discussion with neurology however per pharmacist, ID plans to do third LP so holding Plavix for that.  Large hematoma above right eye: He developed large hematoma on the  morning of 02/22/2020 after he is continued to pluck his right forehead laceration. Due to the size, he was unable to open his eye but there was no pressure on the eyeball. For this reason, I had consulted ENT and he was evaluated by them but they did not feel the need to evacuate hematoma.  Patient's hematoma is decreasing in size and he is able to open about 90% of his right eye.  No more bleeding.  Acute Ischemic Infarct  -Seen on MRI imaging on 10/7: left thalamic signal abnormality measuring 2.3 cm most consistent with acute infarct; no edema or hemorrhagic conversion. 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 2 mm left paraophthalmic ICA aneurysm. Widely patent cervical carotid and vertebral arteries. Right upper lobe pulmonary nodules measuring up to 5 mm. Aortic Atherosclerosis (ICD10-I70.0) and Emphysema." -  now on atorvastatin 80 mg despite that he takes 20 at home given his acute CVA - TTE negative for vegetations or thrombosis.  - Continue high intensity statin; Lipid panel done and showed a total cholesterol/HDL ratio 3.3, cholesterol level 154, HDL level 46, LDL 79, triglycerides 146, VLDL 29 -PT OT recommending CIR next-appreciate neurology further evaluation recommendations.  He remained on aspirin and Plavix was resumed on 02/24/2020 after I had discussion with neurologist.  Tonic-Clonic Seizures Status Epilepticus: Resolved - Secondary to meningitis  - Neurology on board; appreciate their recommendations Continue Keppra.  Hypomagnesemia Resolved.  Hypophosphatemia Resolved.  B12 Deficiency  Moderate Malnutrition - Likely due to malnutrition and anorexia, possibly worsened by malabsorption given bowel inflammation seen on imaging the past several months prior to  resection. Of note, 70 lb weight loss in the past 10 months.  - Consult to dietician for tube feed initiation, he was started on tube feedings which was discontinued now that he is tolerating dysphagia 2 diet. Nutritionist now recommending hormonal shake twice daily as well as Magic cup twice daily with meals and multivitamin with minerals daily.  HTN/HLD -Home medications include Toprol 100 mg, Amlodipine 5 mg, Lipitor 20 mg, Fenofibrate 160 mg.  Blood pressure very well controlled.  Will continue amlodipine to 10 mg and Toprol-XL 100 mg p.o. daily.   Normocytic Anemia  Hx of Iron Deficiency Anemia B12 Deficiency  - B12 levels low. Iron panel shows low saturation, however normal iron and TIBC, unclear picture as not consistent completely with iron deficiency anemia or anemia of chronic disease.  Hemoglobin is stable.  ? Crohn's Disease s/p Ileocolic Resection  - No longer on immunosuppressive therapy. -Takes budesonide as an outpatient but this has not been resumed yet.  AKI on CKD stage II Slight jump in creatinine.  Likely due to dehydration.  Will start on gentle hydration normal saline 75 cc/h for 24 hours.  Repeat labs in the morning.  DVT prophylaxis: SCDs Heparin 5000 units subcu every 8 hours Code Status: FULL CODE  Family Communication: No family present at bedside.  Daughter Threasa Beards updated yesterday. Disposition Plan: Plan to discharge to CIR once cleared by ID and neurology.  Status is: Inpatient  Remains inpatient appropriate because:Altered mental status, Unsafe d/c plan, IV treatments appropriate due to intensity of illness  or inability to take PO and Inpatient level of care appropriate due to severity of illness  Dispo: The patient is from: Home              Anticipated d/c is to: CIR              Anticipated d/c date is:> 3 days              Patient currently is not medically stable to d/c.  Consultants:   Neurology  PCCM Transfer   Procedures:  10/6 >>  ETT placed 10/7 >> LP 10/8 >> Extubated   Significant Diagnostic Tests:  10/6 CT Head >> No acute intracranial abnormalities; age-related volume loss and chronic small vessel disease 10/7 MRI brain: 1. Signal abnormality in the left thalamus most consistent with an acute infarct. 2. Signal abnormality in the mesial left temporal lobe and splenium of the corpus callosum which may reflect acute infarct, sequelae of recent seizure activity, or encephalitis (including herpes encephalitis). No contralateral cerebral involvement. 3. Mild chronic small vessel ischemic disease. 10/8 echo: LVEF 55-60%, mod LVH, Grade I diastolic dysfunction 10/1 CT Head >> Progressive low density in the left thalamus related to acute infarction; no hemorrhage 10/9 EEG >> Negative  Micro Data:  10/6 COVID-19 + influenza >> Negative  10/6 Blood Cultures >> NGTD 10/7 CSF Culture >> NGTD 10/7 HSV serology >> 2.02 on igg testing. Will send pcr to determine past or active.  10/7 [place] serology >>  10/7 VRDL serology >> 10/8 Quanitferon Gold  >> indeterminant 10/9 acid fast csf:  10/11 HSV PCR: pending   Antimicrobials:  Anti-infectives (From admission, onward)   Start     Dose/Rate Route Frequency Ordered Stop   02/26/20 1115  rifampin (RIFADIN) 600 mg in sodium chloride 0.9 % 100 mL IVPB        600 mg 200 mL/hr over 30 Minutes Intravenous Every 24 hours 02/26/20 1026     02/23/20 0100  acyclovir (ZOVIRAX) 615 mg in dextrose 5 % 100 mL IVPB  Status:  Discontinued        10 mg/kg  61.3 kg 112.3 mL/hr over 60 Minutes Intravenous Every 12 hours 02/22/20 1537 02/24/20 0936   02/22/20 2100  amphotericin B liposome (AMBISOME) 310 mg in dextrose 5 % 500 mL IVPB        5 mg/kg  61.3 kg 288.8 mL/hr over 120 Minutes Intravenous Every 24 hours 02/22/20 1524     02/22/20 1800  rifampin (RIFADIN) capsule 600 mg  Status:  Discontinued        600 mg Oral Daily 02/22/20 1520 02/26/20 1026   02/22/20 1800   isoniazid (NYDRAZID) tablet 300 mg        300 mg Oral Daily 02/22/20 1520     02/22/20 1800  pyrazinamide tablet 1,500 mg        1,500 mg Oral Daily 02/22/20 1520     02/22/20 1800  ethambutol (MYAMBUTOL) tablet 1,200 mg        1,200 mg Oral Daily 02/22/20 1520     02/20/20 1000  vancomycin (VANCOCIN) IVPB 1000 mg/200 mL premix  Status:  Discontinued        1,000 mg 200 mL/hr over 60 Minutes Intravenous Every 24 hours 02/20/20 0555 02/21/20 1259   02/19/20 1811  vancomycin variable dose per unstable renal function (pharmacist dosing)  Status:  Discontinued         Does not apply See admin instructions 02/19/20 1811  02/20/20 0555   02/18/20 0500  vancomycin (VANCOREADY) IVPB 750 mg/150 mL  Status:  Discontinued        750 mg 150 mL/hr over 60 Minutes Intravenous Every 12 hours 02/17/20 1651 02/19/20 1811   02/15/20 0300  vancomycin (VANCOREADY) IVPB 500 mg/100 mL        500 mg 100 mL/hr over 60 Minutes Intravenous Every 12 hours 02/14/20 1352 02/17/20 1755   02/14/20 1400  acyclovir (ZOVIRAX) 690 mg in dextrose 5 % 100 mL IVPB  Status:  Discontinued        10 mg/kg  68.9 kg 113.8 mL/hr over 60 Minutes Intravenous Every 12 hours 02/14/20 1245 02/22/20 1537   02/14/20 1400  vancomycin (VANCOREADY) IVPB 1250 mg/250 mL        1,250 mg 166.7 mL/hr over 90 Minutes Intravenous STAT 02/14/20 1352 02/14/20 1655   02/14/20 1300  cefTRIAXone (ROCEPHIN) 2 g in sodium chloride 0.9 % 100 mL IVPB  Status:  Discontinued        2 g 200 mL/hr over 30 Minutes Intravenous Every 12 hours 02/14/20 1245 02/20/20 1732   02/14/20 1300  ampicillin (OMNIPEN) 2 g in sodium chloride 0.9 % 100 mL IVPB  Status:  Discontinued        2 g 300 mL/hr over 20 Minutes Intravenous Every 6 hours 02/14/20 1245 02/18/20 1051       Subjective: Seen and examined.  Patient alert and oriented x2.  Was asking appropriate questions about when he will be discharged today.  Had no complaints.  Objective: Vitals:   02/25/20 1937  02/25/20 2339 02/26/20 0349 02/26/20 0737  BP: (!) 145/78 (!) 147/77 (!) 142/82 (!) 156/71  Pulse: (!) 50 (!) 44 (!) 43 (!) 48  Resp: 18 18 18 16   Temp: 99.6 F (37.6 C) 98.6 F (37 C) 98.2 F (36.8 C) 97.6 F (36.4 C)  TempSrc: Oral Oral Oral Axillary  SpO2: 100% 100% 100% 98%  Weight:      Height:        Intake/Output Summary (Last 24 hours) at 02/26/2020 1028 Last data filed at 02/26/2020 0843 Gross per 24 hour  Intake 1033.85 ml  Output 1100 ml  Net -66.15 ml   Filed Weights   02/20/20 0211 02/21/20 0322 02/24/20 0415  Weight: 60.8 kg 61.3 kg 66.7 kg   Examination: Physical Exam:  General exam: Appears calm and comfortable  Respiratory system: Clear to auscultation. Respiratory effort normal. Cardiovascular system: S1 & S2 heard, RRR. No JVD, murmurs, rubs, gallops or clicks. No pedal edema. Gastrointestinal system: Abdomen is nondistended, soft and nontender. No organomegaly or masses felt. Normal bowel sounds heard. Central nervous system: Alert and oriented x2. No focal neurological deficits. Extremities: Symmetric 5 x 5 power. Skin: No rashes, lesions or ulcers.   Data Reviewed: I have personally reviewed following labs and imaging studies  CBC: Recent Labs  Lab 02/21/20 0814  WBC 7.4  NEUTROABS 5.5  HGB 10.4*  HCT 32.6*  MCV 88.6  PLT 161   Basic Metabolic Panel: Recent Labs  Lab 02/20/20 0449 02/21/20 0432 02/22/20 0804 02/23/20 0341 02/24/20 0256 02/25/20 0216 02/26/20 0240 02/26/20 0803  NA 134*   < > 136 135 132* 132* 130*  --   K 3.4*   < > 3.9 4.2 4.3 4.1 3.9  --   CL 99   < > 97* 103 104 106 100  --   CO2 24   < > 27 20* 20* 17* 19*  --  GLUCOSE 97   < > 119* 106* 83 100* 88  --   BUN 15   < > 15 19 23  25* 30*  --   CREATININE 1.20   < > 1.38* 1.24 1.21 1.23 1.35*  --   CALCIUM 8.8*   < > 9.7 9.3 8.8* 9.1 9.3  --   MG 2.0  --   --  1.5* 1.8 1.7  --  2.4  PHOS 3.0   < > 2.8 3.4 3.2 3.7 5.8*  --    < > = values in this interval not  displayed.   GFR: Estimated Creatinine Clearance: 46.7 mL/min (A) (by C-G formula based on SCr of 1.35 mg/dL (H)). Liver Function Tests: Recent Labs  Lab 02/23/20 0341  AST 22  ALT 18  ALKPHOS 36*  BILITOT 0.5  PROT 5.9*  ALBUMIN 3.2*   No results for input(s): LIPASE, AMYLASE in the last 168 hours. No results for input(s): AMMONIA in the last 168 hours. Coagulation Profile: No results for input(s): INR, PROTIME in the last 168 hours. Cardiac Enzymes: No results for input(s): CKTOTAL, CKMB, CKMBINDEX, TROPONINI in the last 168 hours. BNP (last 3 results) No results for input(s): PROBNP in the last 8760 hours. HbA1C: No results for input(s): HGBA1C in the last 72 hours. CBG: Recent Labs  Lab 02/25/20 1607 02/25/20 1943 02/25/20 2338 02/26/20 0353 02/26/20 0736  GLUCAP 113* 121* 137* 86 70   Lipid Profile: No results for input(s): CHOL, HDL, LDLCALC, TRIG, CHOLHDL, LDLDIRECT in the last 72 hours. Thyroid Function Tests: No results for input(s): TSH, T4TOTAL, FREET4, T3FREE, THYROIDAB in the last 72 hours. Anemia Panel: No results for input(s): VITAMINB12, FOLATE, FERRITIN, TIBC, IRON, RETICCTPCT in the last 72 hours. Sepsis Labs: No results for input(s): PROCALCITON, LATICACIDVEN in the last 168 hours.  Recent Results (from the past 240 hour(s))  Acid Fast Smear (AFB)     Status: None   Collection Time: 02/17/20  1:11 PM   Specimen: PATH Cytology CSF  Result Value Ref Range Status   AFB Specimen Processing Concentration  Final   Acid Fast Smear Negative  Final    Comment: (NOTE) Performed At: [facility] [place], [place] [id number] Rush Farmer MD [id number]    Source (AFB) CSF  Final    Comment: Performed at [facility], [place] [address], [place], [place] [address]  Culture, fungus without smear     Status: None (Preliminary result)   Collection Time: 02/17/20  1:14 PM   Specimen: CSF; Cerebrospinal Fluid  Result Value Ref  Range Status   Specimen Description CSF  Final   Special Requests NONE  Final   Culture   Final    NO FUNGUS ISOLATED AFTER 6 DAYS Performed at [facility], [place] [address], [place], [place] [address]    Report Status PENDING  Incomplete  Culture, fungus without smear     Status: None (Preliminary result)   Collection Time: 02/22/20 11:38 AM   Specimen: CSF; Other  Result Value Ref Range Status   Specimen Description CSF TUBE 2  Final   Special Requests Immunocompromised  Final   Culture   Final    CULTURE REINCUBATED FOR BETTER GROWTH Performed at [facility], [address], [place], [place] [address]    Report Status PENDING  Incomplete  Acid Fast Smear (AFB)     Status: None   Collection Time: 02/22/20 11:38 AM   Specimen: CSF;  Cerebrospinal Fluid  Result Value Ref Range Status   AFB Specimen Processing Concentration  Final   Acid Fast Smear Negative  Final    Comment: (NOTE) Performed At: [facility] [place], [place] [id number] Rush Farmer MD ZD:[id number]    Source (AFB) CSF  Final    Comment: TUBE 2 Performed at [facility], [place] [address], [place], [place] [address]      RN Pressure Injury Documentation:     Estimated body mass index is 21.1 kg/m as calculated from the following:   Height as of this encounter: 5\' 10"  (1.778 m).   Weight as of this encounter: 66.7 kg.  Malnutrition Type:  Nutrition Problem: Moderate Malnutrition   Malnutrition Characteristics:  Signs/Symptoms: mild fat depletion, moderate fat depletion, mild muscle depletion, moderate muscle depletion   Nutrition Interventions:  Interventions: Hormel Shake, Magic cup, MVI   Radiology Studies: No results found. Scheduled Meds: . amLODipine  10 mg Oral Daily  . aspirin  81 mg Oral Daily  . atorvastatin  80 mg Oral Daily  . Chlorhexidine Gluconate Cloth  6 each Topical Daily  . clopidogrel  75 mg Oral Daily  . dexamethasone (DECADRON)  injection  18 mg Intravenous Daily  . dextrose  10 mL Intravenous Q24H  . dextrose  10 mL Intravenous Q24H  . docusate  100 mg Oral BID  . ethambutol  1,200 mg Oral Daily  . fenofibrate  160 mg Oral Daily  . heparin  5,000 Units Subcutaneous Q8H  . insulin aspart  0-9 Units Subcutaneous Q4H  . isoniazid  300 mg Oral Daily  . levETIRAcetam  1,000 mg Oral Q12H  . metoprolol succinate  50 mg Oral Daily  . multivitamin with minerals  1 tablet Oral Daily  . pantoprazole sodium  40 mg Oral Daily  . polyethylene glycol  17 g Oral Daily  . potassium chloride  40 mEq Oral Daily  . pyrazinamide  1,500 mg Oral Daily  . vitamin B-6  50 mg Oral Daily  . sodium chloride  500 mL Intravenous Q24H  . sodium chloride  500 mL Intravenous Q24H  . vitamin B-12  1,000 mcg Oral Daily   Continuous Infusions: . sodium chloride Stopped (02/24/20 0116)  . sodium chloride Stopped (02/24/20 0218)  . sodium chloride 75 mL/hr at 02/26/20 0959  . amphotericin  B  Liposome (AMBISOME) ADULT IV 310 mg (02/25/20 2135)  . rifampin (RIFADIN) IVPB      LOS: 13 days   Darliss Cheney, MD [facility] PAGER is on [place]  If 7PM-7AM, please contact night-coverage [email or web address]

## 2020-02-26 NOTE — Progress Notes (Signed)
Subjective:  Patient without complaints  Antibiotics:  Anti-infectives (From admission, onward)   Start     Dose/Rate Route Frequency Ordered Stop   02/26/20 1115  rifampin (RIFADIN) 600 mg in sodium chloride 0.9 % 100 mL IVPB        600 mg 200 mL/hr over 30 Minutes Intravenous Every 24 hours 02/26/20 1026     02/23/20 0100  acyclovir (ZOVIRAX) 615 mg in dextrose 5 % 100 mL IVPB  Status:  Discontinued        10 mg/kg  61.3 kg 112.3 mL/hr over 60 Minutes Intravenous Every 12 hours 02/22/20 1537 02/24/20 0936   02/22/20 2100  amphotericin B liposome (AMBISOME) 310 mg in dextrose 5 % 500 mL IVPB        5 mg/kg  61.3 kg 288.8 mL/hr over 120 Minutes Intravenous Every 24 hours 02/22/20 1524     02/22/20 1800  rifampin (RIFADIN) capsule 600 mg  Status:  Discontinued        600 mg Oral Daily 02/22/20 1520 02/26/20 1026   02/22/20 1800  isoniazid (NYDRAZID) tablet 300 mg        300 mg Oral Daily 02/22/20 1520     02/22/20 1800  pyrazinamide tablet 1,500 mg        1,500 mg Oral Daily 02/22/20 1520     02/22/20 1800  ethambutol (MYAMBUTOL) tablet 1,200 mg        1,200 mg Oral Daily 02/22/20 1520     02/20/20 1000  vancomycin (VANCOCIN) IVPB 1000 mg/200 mL premix  Status:  Discontinued        1,000 mg 200 mL/hr over 60 Minutes Intravenous Every 24 hours 02/20/20 0555 02/21/20 1259   02/19/20 1811  vancomycin variable dose per unstable renal function (pharmacist dosing)  Status:  Discontinued         Does not apply See admin instructions 02/19/20 1811 02/20/20 0555   02/18/20 0500  vancomycin (VANCOREADY) IVPB 750 mg/150 mL  Status:  Discontinued        750 mg 150 mL/hr over 60 Minutes Intravenous Every 12 hours 02/17/20 1651 02/19/20 1811   02/15/20 0300  vancomycin (VANCOREADY) IVPB 500 mg/100 mL        500 mg 100 mL/hr over 60 Minutes Intravenous Every 12 hours 02/14/20 1352 02/17/20 1755   02/14/20 1400  acyclovir (ZOVIRAX) 690 mg in dextrose 5 % 100 mL IVPB  Status:   Discontinued        10 mg/kg  68.9 kg 113.8 mL/hr over 60 Minutes Intravenous Every 12 hours 02/14/20 1245 02/22/20 1537   02/14/20 1400  vancomycin (VANCOREADY) IVPB 1250 mg/250 mL        1,250 mg 166.7 mL/hr over 90 Minutes Intravenous STAT 02/14/20 1352 02/14/20 1655   02/14/20 1300  cefTRIAXone (ROCEPHIN) 2 g in sodium chloride 0.9 % 100 mL IVPB  Status:  Discontinued        2 g 200 mL/hr over 30 Minutes Intravenous Every 12 hours 02/14/20 1245 02/20/20 1732   02/14/20 1300  ampicillin (OMNIPEN) 2 g in sodium chloride 0.9 % 100 mL IVPB  Status:  Discontinued        2 g 300 mL/hr over 20 Minutes Intravenous Every 6 hours 02/14/20 1245 02/18/20 1051      Medications: Scheduled Meds: . amLODipine  10 mg Oral Daily  . aspirin  81 mg Oral Daily  . atorvastatin  80 mg Oral Daily  .  Chlorhexidine Gluconate Cloth  6 each Topical Daily  . dexamethasone (DECADRON) injection  18 mg Intravenous Daily  . dextrose  10 mL Intravenous Q24H  . dextrose  10 mL Intravenous Q24H  . docusate  100 mg Oral BID  . ethambutol  1,200 mg Oral Daily  . fenofibrate  160 mg Oral Daily  . heparin  5,000 Units Subcutaneous Q8H  . insulin aspart  0-9 Units Subcutaneous Q4H  . isoniazid  300 mg Oral Daily  . levETIRAcetam  1,000 mg Oral Q12H  . metoprolol succinate  50 mg Oral Daily  . multivitamin with minerals  1 tablet Oral Daily  . pantoprazole sodium  40 mg Oral Daily  . polyethylene glycol  17 g Oral Daily  . potassium chloride  40 mEq Oral Daily  . pyrazinamide  1,500 mg Oral Daily  . vitamin B-6  50 mg Oral Daily  . sodium chloride  500 mL Intravenous Q24H  . sodium chloride  500 mL Intravenous Q24H  . vitamin B-12  1,000 mcg Oral Daily   Continuous Infusions: . sodium chloride Stopped (02/24/20 0116)  . sodium chloride Stopped (02/24/20 0218)  . sodium chloride 75 mL/hr at 02/26/20 0959  . amphotericin  B  Liposome (AMBISOME) ADULT IV 310 mg (02/25/20 2135)  . rifampin (RIFADIN) IVPB      PRN Meds:.sodium chloride, acetaminophen, albuterol, diphenhydrAMINE **OR** diphenhydrAMINE, docusate, labetalol, meperidine (DEMEROL) injection, Resource ThickenUp Clear    Objective: Weight change:   Intake/Output Summary (Last 24 hours) at 02/26/2020 1109 Last data filed at 02/26/2020 0843 Gross per 24 hour  Intake 1033.85 ml  Output 1100 ml  Net -66.15 ml   Blood pressure (!) 156/71, pulse (!) 48, temperature 97.6 F (36.4 C), temperature source Axillary, resp. rate 16, height 5\' 10"  (1.778 m), weight 66.7 kg, SpO2 98 %. Temp:  [97.6 F (36.4 C)-99.6 F (37.6 C)] 97.6 F (36.4 C) (10/19 0737) Pulse Rate:  [43-54] 48 (10/19 0737) Resp:  [16-18] 16 (10/19 0737) BP: (119-156)/(68-82) 156/71 (10/19 0737) SpO2:  [98 %-100 %] 98 % (10/19 0737)  Physical Exam: General: Awake and interactive.  He does not know what state he is in but he knows he is in a hospital  [facility] regular rate, normal  Chest: , no wheezing, no respiratory distress Abdomen: soft non-distended,  Extremities: no edema or deformity noted bilaterally Skin: no rashes Neuro: nonfocal  CBC:    BMET Recent Labs    02/25/20 0216 02/26/20 0240  NA 132* 130*  K 4.1 3.9  CL 106 100  CO2 17* 19*  GLUCOSE 100* 88  BUN 25* 30*  CREATININE 1.23 1.35*  CALCIUM 9.1 9.3     Liver Panel  No results for input(s): PROT, ALBUMIN, AST, ALT, ALKPHOS, BILITOT, BILIDIR, IBILI in the last 72 hours.     Sedimentation Rate No results for input(s): ESRSEDRATE in the last 72 hours. C-Reactive Protein No results for input(s): CRP in the last 72 hours.  Micro Results: Recent Results (from the past 720 hour(s))  Respiratory Panel by RT PCR (Flu A&B, Covid) - Nasopharyngeal Swab     Status: None   Collection Time: 02/13/20  4:17 PM   Specimen: Nasopharyngeal Swab  Result Value Ref Range Status   SARS Coronavirus 2 by RT PCR NEGATIVE NEGATIVE Final    Comment: (NOTE) SARS-CoV-2 target nucleic acids are NOT  DETECTED.  The SARS-CoV-2 RNA is generally detectable in upper respiratoy specimens during the acute phase of infection. The  lowest concentration of SARS-CoV-2 viral copies this assay can detect is 131 copies/mL. A negative result does not preclude SARS-Cov-2 infection and should not be used as the sole basis for treatment or other patient management decisions. A negative result may occur with  improper specimen collection/handling, submission of specimen other than nasopharyngeal swab, presence of viral mutation(s) within the areas targeted by this assay, and inadequate number of viral copies (<131 copies/mL). A negative result must be combined with clinical observations, patient history, and epidemiological information. The expected result is Negative.  Fact Sheet for Patients:  PinkCheek.be  Fact Sheet for Healthcare Providers:  GravelBags.it  This test is no t yet approved or cleared by the [place] FDA and  has been authorized for detection and/or diagnosis of SARS-CoV-2 by FDA under an Emergency Use Authorization (EUA). This EUA will remain  in effect (meaning this test can be used) for the duration of the COVID-19 declaration under Section 564(b)(1) of the Act, 21 U.S.C. section 360bbb-3(b)(1), unless the authorization is terminated or revoked sooner.     Influenza A by PCR NEGATIVE NEGATIVE Final   Influenza B by PCR NEGATIVE NEGATIVE Final    Comment: (NOTE) The Xpert Xpress SARS-CoV-2/FLU/RSV assay is intended as an aid in  the diagnosis of influenza from Nasopharyngeal swab specimens and  should not be used as a sole basis for treatment. Nasal washings and  aspirates are unacceptable for Xpert Xpress SARS-CoV-2/FLU/RSV  testing.  Fact Sheet for Patients: PinkCheek.be  Fact Sheet for Healthcare Providers: GravelBags.it  This test is not yet  approved or cleared by the [place] FDA and  has been authorized for detection and/or diagnosis of SARS-CoV-2 by  FDA under an Emergency Use Authorization (EUA). This EUA will remain  in effect (meaning this test can be used) for the duration of the  Covid-19 declaration under Section 564(b)(1) of the Act, 21  U.S.C. section 360bbb-3(b)(1), unless the authorization is  terminated or revoked. Performed at [facility], [place] [address], [place], [place] [address]   Culture, blood (Routine X 2) w Reflex to ID Panel     Status: None   Collection Time: 02/13/20  4:57 PM   Specimen: BLOOD LEFT FOREARM  Result Value Ref Range Status   Specimen Description BLOOD LEFT FOREARM  Final   Special Requests   Final    BOTTLES DRAWN AEROBIC AND ANAEROBIC Blood Culture results may not be optimal due to an excessive volume of blood received in culture bottles   Culture   Final    NO GROWTH 5 DAYS Performed at [facility], [place] [address] [place], [place] [address]    Report Status 02/18/2020 FINAL  Final  Culture, blood (Routine X 2) w Reflex to ID Panel     Status: None   Collection Time: 02/13/20  5:02 PM   Specimen: BLOOD LEFT WRIST  Result Value Ref Range Status   Specimen Description BLOOD LEFT WRIST  Final   Special Requests   Final    BOTTLES DRAWN AEROBIC AND ANAEROBIC Blood Culture results may not be optimal due to an excessive volume of blood received in culture bottles   Culture   Final    NO GROWTH 5 DAYS Performed at [facility], [place] [address] [place],  [address]    Report Status 02/18/2020 FINAL  Final  MRSA PCR Screening     Status: Abnormal   Collection Time: 02/14/20  3:03 AM   Specimen:  Nasal Mucosa; Nasopharyngeal  Result Value Ref Range Status   MRSA by PCR POSITIVE (A) NEGATIVE Final    Comment:        The GeneXpert MRSA Assay (FDA approved for NASAL specimens only), is one component of a comprehensive MRSA colonization surveillance  program. It is not intended to diagnose MRSA infection nor to guide or monitor treatment for MRSA infections. RESULT CALLED TO, READ BACK BY AND VERIFIED WITH: Lillie Fragmin RN 8:25 02/14/20 ([email or web address]) Performed at [facility], [place] [address] [place], [place] [address]   CSF culture     Status: None   Collection Time: 02/14/20 11:01 AM   Specimen: CSF; Cerebrospinal Fluid  Result Value Ref Range Status   Specimen Description CSF  Final   Special Requests NONE  Final   Gram Stain   Final    WBC PRESENT,BOTH PMN AND MONONUCLEAR NO ORGANISMS SEEN CYTOSPIN SMEAR    Culture   Final    NO GROWTH 3 DAYS Performed at [facility], [place] [address], [place], [place] [address]    Report Status 02/17/2020 FINAL  Final  Acid Fast Smear (AFB)     Status: None   Collection Time: 02/17/20  1:11 PM   Specimen: PATH Cytology CSF  Result Value Ref Range Status   AFB Specimen Processing Concentration  Final   Acid Fast Smear Negative  Final    Comment: (NOTE) Performed At: [facility] [place], [place] [id number] Rush Farmer MD IH:[id number]    Source (AFB) CSF  Final    Comment: Performed at [facility], [place] [address] [place], [place] [address]  Culture, fungus without smear     Status: None (Preliminary result)   Collection Time: 02/17/20  1:14 PM   Specimen: CSF; Cerebrospinal Fluid  Result Value Ref Range Status   Specimen Description CSF  Final   Special Requests NONE  Final   Culture   Final    NO FUNGUS ISOLATED AFTER 6 DAYS Performed at [facility], [place] [address], [place], [place] [address]    Report Status PENDING  Incomplete  Culture, fungus without smear     Status: None (Preliminary result)   Collection Time: 02/22/20 11:38 AM   Specimen: CSF; Other  Result Value Ref Range Status   Specimen Description CSF TUBE 2  Final   Special Requests Immunocompromised  Final   Culture   Final    CULTURE REINCUBATED FOR BETTER  GROWTH Performed at [facility], [address] [place], [place] [address]    Report Status PENDING  Incomplete  Acid Fast Smear (AFB)     Status: None   Collection Time: 02/22/20 11:38 AM   Specimen: CSF; Cerebrospinal Fluid  Result Value Ref Range Status   AFB Specimen Processing Concentration  Final   Acid Fast Smear Negative  Final    Comment: (NOTE) Performed At: [facility] [place], [place] [id number] Rush Farmer MD UX:[id number]    Source (AFB) CSF  Final    Comment: TUBE 2 Performed at [facility], [address] [place],  [address]     Studies/Results: No results found.    Assessment/Plan:  INTERVAL HISTORY:   Started amphotericin along with RI PE plus corticosteroids after his last lumbar puncture  Repeat VZV PCR negative MTB PCR from first spinal tap is negative Principal Problem:   Aseptic meningitis Active Problems:   Seizure ([place])  Weight loss   Encounter for nasogastric (NG) tube placement    Ha Placeres is a 74 y.o. 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  HSV 1 and 2 PCR's were negative x2 CSF cultures originally taken were without growth.  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  I will go ahead and order an MRI of his brain now to see a comparison 7 October.     LOS: 13 days   Alcide Evener 02/26/2020, 11:09 AM

## 2020-02-26 NOTE — Progress Notes (Addendum)
Pharmacy Antibiotic Note  Howard Allen is a 74 y.o. 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 Thus far, no organism has been identified as a cause of infection.   ID plan for LP in the future- Plavix stopped today.  Prior CSF cultures no growth, HSV PCR's negative.  CSF studies for Coccidioides, Blastomyces, Histoplasma, and paraneoplastic panel pending.    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 Current SCr is stable at 1.35 (BL ~1.2-1.4). Will provide pre and post hydration of amphotericin B with 500 mL NS. Continue to watch renal function and adjust NS if Scr continues to increase.   Potassium remains stable (3.9 today) and patient is currently on a daily potassium supplement. Mg today is 2.4- no replacement needed.   Plan: Amphotericin B 310 mg (~ 5 mg/kg) every 24 hours  Give 500 mL of normal saline 1 hour prior to dose Flush line with dextrose in-between normal saline and amphotericin B doses Give another 500 mL normal saline after amphotericin B infusion is complete  Administration instructions reviewed with RN Daily K supplement Supplement Mg as needed F/u daily K and Mg levels   Rifampin 600 mg daily Isoniazid 300 mg daily Pyridoxine 50 mg daily Pyrazinamide 1500 mg daily Ethambutol 1200 mg daily Dexamethasone IV 0.3 mg/kg (~18 mg) daily for 2 weeks- Will taper as appropriate pending results of CSF    Height: 5\' 10"  (177.8 cm) Weight: 66.7 kg (147 lb 0.8 oz) IBW/kg (Calculated) : 73  Temp (24hrs), Avg:98.4 F (36.9 C), Min:97.6 F (36.4 C), Max:99.6 F (37.6 C)  Recent Labs  Lab 02/19/20 1645 02/19/20 1813 02/20/20 0449 02/21/20 0432 02/21/20 0814 02/22/20 0804 02/23/20 0341 02/24/20 0256 02/25/20 0216 02/26/20 0240  WBC  --   --   --   --  7.4  --   --   --   --   --   CREATININE  --    < > 1.20   < >  --  1.38* 1.24 1.21 1.23 1.35*  VANCOTROUGH 26*  --   --   --    --   --   --   --   --   --   VANCORANDOM  --   --  16  --   --   --   --   --   --   --    < > = values in this interval not displayed.    Estimated Creatinine Clearance: 46.7 mL/min (A) (by C-G formula based on SCr of 1.35 mg/dL (H)).    Allergies  Allergen Reactions  . Lisinopril Anaphylaxis, Cough and Other (See Comments)    Other reaction(s): anaphylaxis Pt states it made him "cough his head off"   . Lorazepam     Other reaction(s): Hallucinations     Dimple Nanas, PharmD PGY-1 Acute Care Pharmacy Resident Office: [phone] 02/26/2020 11:07 AM

## 2020-02-27 DIAGNOSIS — R569 Unspecified convulsions: Secondary | ICD-10-CM | POA: Diagnosis not present

## 2020-02-27 DIAGNOSIS — G030 Nonpyogenic meningitis: Secondary | ICD-10-CM | POA: Diagnosis not present

## 2020-02-27 DIAGNOSIS — G40901 Epilepsy, unspecified, not intractable, with status epilepticus: Secondary | ICD-10-CM | POA: Diagnosis not present

## 2020-02-27 DIAGNOSIS — E876 Hypokalemia: Secondary | ICD-10-CM | POA: Diagnosis not present

## 2020-02-27 LAB — GLUCOSE, CAPILLARY
Glucose-Capillary: 108 mg/dL — ABNORMAL HIGH (ref 70–99)
Glucose-Capillary: 113 mg/dL — ABNORMAL HIGH (ref 70–99)
Glucose-Capillary: 120 mg/dL — ABNORMAL HIGH (ref 70–99)
Glucose-Capillary: 120 mg/dL — ABNORMAL HIGH (ref 70–99)
Glucose-Capillary: 33 mg/dL — CL (ref 70–99)
Glucose-Capillary: 65 mg/dL — ABNORMAL LOW (ref 70–99)
Glucose-Capillary: 66 mg/dL — ABNORMAL LOW (ref 70–99)
Glucose-Capillary: 86 mg/dL (ref 70–99)
Glucose-Capillary: 86 mg/dL (ref 70–99)
Glucose-Capillary: 91 mg/dL (ref 70–99)

## 2020-02-27 LAB — PHOSPHORUS: Phosphorus: 6 mg/dL — ABNORMAL HIGH (ref 2.5–4.6)

## 2020-02-27 LAB — BASIC METABOLIC PANEL
Anion gap: 11 (ref 5–15)
BUN: 33 mg/dL — ABNORMAL HIGH (ref 8–23)
CO2: 16 mmol/L — ABNORMAL LOW (ref 22–32)
Calcium: 9.4 mg/dL (ref 8.9–10.3)
Chloride: 105 mmol/L (ref 98–111)
Creatinine, Ser: 1.3 mg/dL — ABNORMAL HIGH (ref 0.61–1.24)
GFR, Estimated: 55 mL/min — ABNORMAL LOW (ref >60)
Glucose, Bld: 99 mg/dL (ref 70–99)
Potassium: 4.9 mmol/L (ref 3.5–5.1)
Sodium: 132 mmol/L — ABNORMAL LOW (ref 135–145)

## 2020-02-27 LAB — MTB RIF NAA NON-SPUTUM, W/O CULTURE

## 2020-02-27 LAB — MAGNESIUM: Magnesium: 2 mg/dL (ref 1.7–2.4)

## 2020-02-27 MED ORDER — SODIUM CHLORIDE 0.9 % IV BOLUS
500.0000 mL | INTRAVENOUS | Status: DC
  Administered 2020-02-27 – 2020-02-29 (×2): 500 mL via INTRAVENOUS

## 2020-02-27 MED ORDER — DEXTROSE 50 % IV SOLN
INTRAVENOUS | Status: AC
  Administered 2020-02-27: 25 mL
  Filled 2020-02-27: qty 50

## 2020-02-27 MED ORDER — GERHARDT'S BUTT CREAM
TOPICAL_CREAM | CUTANEOUS | Status: DC | PRN
  Filled 2020-02-27: qty 1

## 2020-02-27 NOTE — Progress Notes (Signed)
SLP Cancellation Note  Patient Details Name: Howard Allen MRN: [id number] DOB: [birth year]   Cancelled treatment:       Reason Eval/Treat Not Completed: Patient at procedure or test/unavailable (Pt currently receiving care from RN. SLP will f/u)  Tobie Poet I. Hardin Negus, [place], [place] Office number [phone] Pager [place] 02/27/2020, 11:17 AM

## 2020-02-27 NOTE — Progress Notes (Signed)
  Speech Language Pathology Treatment: Dysphagia  Patient Details Name: Howard Allen MRN: [id number] DOB: October 19, 1945 Today's Date: 02/27/2020 Time: 2563-8937 SLP Time Calculation (min) (ACUTE ONLY): 29 min  Assessment / Plan / Recommendation Clinical Impression  Pt was seen for treatment and was cooperative during the session. He stated that he believes his cognition is improved compared to days prior and that it is "pretty close" to normal. Pt was alert throughout the session. He completed time management problems and a sequencing task with frequent cueing. He achieved 60% accuracy with mental manipulation tasks increasing to 100% accuracy with cues. Pt was seen for dysarthria treatment and was cooperative during the session. He was educated regarding the nature of dysarthria, and compensatory strategies to improve speech intelligibility. Pt verbalized understanding regarding all areas of education; however, the extent of his understanding is questioned due to his subsequent performance. He used compensatory strategies at the word level with 60% accuracy increasing to 100% accuracy with cues for overarticulation and vocal intensity. Pt reported that he has been consuming the current diet without difficulty. RN indicated that he has not been eating much. Pt was seen with lunch tray of pureed mac and cheese, minced broccoli, and chopped grilled chicken. He also consumed trials of nectar thick liquids via cup. No s/sx of aspiration were noted. 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 A modified barium swallow study is recommended to reassess swallow function and determine safety of diet advancement.    HPI HPI: 74 y.o. 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        SLP Plan  MBS       Recommendations  Diet recommendations: Dysphagia 2 (fine chop);Honey-thick liquid Liquids provided via: Cup;Straw Medication Administration: Crushed with puree Supervision: Patient able to self feed;Full supervision/cueing for compensatory strategies Compensations: Slow rate;Small sips/bites;Lingual sweep for clearance of pocketing;Clear throat intermittently Postural Changes and/or Swallow Maneuvers: Seated upright 90 degrees                Oral Care Recommendations: Oral care BID Follow up Recommendations: Skilled Nursing facility SLP Visit Diagnosis: Dysphagia, oropharyngeal phase (R13.12) Plan: MBS       Joei Frangos I. Hardin Negus, [place], [place] Office number [phone] Pager [place] 02/27/2020, 1:04 PM

## 2020-02-27 NOTE — Progress Notes (Signed)
Pharmacy Antibiotic Note  Howard Allen is a 74 y.o. 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 Thus far, no organism has been identified as a cause of infection.   ID plan for LP in the future- Plavix discontinued.  CSF culture from 10/15 now with Candida albicans.  CSF studies for Coccidioides, Blastomyces, Histoplasma, and paraneoplastic panel pending.    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 Current SCr is stable at 1.30 (BL ~1.2-1.4). Will provide pre and post hydration of amphotericin B with 500 mL NS. Continue to watch renal function and adjust NS if Scr continues to increase.   Potassium remains stable (4.9 today) and patient is currently on a daily potassium supplement. Mg today is 2- no replacement needed.   Plan: Amphotericin B 310 mg (~ 5 mg/kg) every 24 hours  Give 500 mL of normal saline 1 hour prior to dose Flush line with dextrose in-between normal saline and amphotericin B doses Give another 500 mL normal saline after amphotericin B infusion is complete  Administration instructions reviewed with RN Daily K supplement Supplement Mg as needed F/u daily K and Mg levels   Rifampin 600 mg daily Isoniazid 300 mg daily Pyridoxine 50 mg daily Pyrazinamide 1500 mg daily Ethambutol 1200 mg daily Dexamethasone IV 0.3 mg/kg (~18 mg) daily for 2 weeks- Will taper as appropriate pending results of CSF    Height: 5\' 10"  (177.8 cm) Weight: 63.3 kg (139 lb 8.8 oz) IBW/kg (Calculated) : 73  Temp (24hrs), Avg:97.9 F (36.6 C), Min:97.4 F (36.3 C), Max:99.4 F (37.4 C)  Recent Labs  Lab 02/21/20 0814 02/22/20 0804 02/23/20 0341 02/24/20 0256 02/25/20 0216 02/26/20 0240 02/27/20 0501  WBC 7.4  --   --   --   --   --   --   CREATININE  --    < > 1.24 1.21 1.23 1.35* 1.30*   < > = values in this interval not displayed.    Estimated Creatinine Clearance: 46 mL/min (A) (by C-G  formula based on SCr of 1.3 mg/dL (H)).    Allergies  Allergen Reactions  . Lisinopril Anaphylaxis, Cough and Other (See Comments)    Other reaction(s): anaphylaxis Pt states it made him "cough his head off"   . Lorazepam     Other reaction(s): Hallucinations     Dimple Nanas, PharmD PGY-1 Acute Care Pharmacy Resident Office: [phone] 02/27/2020 11:41 AM

## 2020-02-27 NOTE — Progress Notes (Signed)
Subjective:  Patient without complaints  Antibiotics:  Anti-infectives (From admission, onward)   Start     Dose/Rate Route Frequency Ordered Stop   02/26/20 1115  rifampin (RIFADIN) 600 mg in sodium chloride 0.9 % 100 mL IVPB        600 mg 200 mL/hr over 30 Minutes Intravenous Every 24 hours 02/26/20 1026     02/23/20 0100  acyclovir (ZOVIRAX) 615 mg in dextrose 5 % 100 mL IVPB  Status:  Discontinued        10 mg/kg  61.3 kg 112.3 mL/hr over 60 Minutes Intravenous Every 12 hours 02/22/20 1537 02/24/20 0936   02/22/20 2100  amphotericin B liposome (AMBISOME) 310 mg in dextrose 5 % 500 mL IVPB        5 mg/kg  61.3 kg 288.8 mL/hr over 120 Minutes Intravenous Every 24 hours 02/22/20 1524     02/22/20 1800  rifampin (RIFADIN) capsule 600 mg  Status:  Discontinued        600 mg Oral Daily 02/22/20 1520 02/26/20 1026   02/22/20 1800  isoniazid (NYDRAZID) tablet 300 mg        300 mg Oral Daily 02/22/20 1520     02/22/20 1800  pyrazinamide tablet 1,500 mg        1,500 mg Oral Daily 02/22/20 1520     02/22/20 1800  ethambutol (MYAMBUTOL) tablet 1,200 mg        1,200 mg Oral Daily 02/22/20 1520     02/20/20 1000  vancomycin (VANCOCIN) IVPB 1000 mg/200 mL premix  Status:  Discontinued        1,000 mg 200 mL/hr over 60 Minutes Intravenous Every 24 hours 02/20/20 0555 02/21/20 1259   02/19/20 1811  vancomycin variable dose per unstable renal function (pharmacist dosing)  Status:  Discontinued         Does not apply See admin instructions 02/19/20 1811 02/20/20 0555   02/18/20 0500  vancomycin (VANCOREADY) IVPB 750 mg/150 mL  Status:  Discontinued        750 mg 150 mL/hr over 60 Minutes Intravenous Every 12 hours 02/17/20 1651 02/19/20 1811   02/15/20 0300  vancomycin (VANCOREADY) IVPB 500 mg/100 mL        500 mg 100 mL/hr over 60 Minutes Intravenous Every 12 hours 02/14/20 1352 02/17/20 1755   02/14/20 1400  acyclovir (ZOVIRAX) 690 mg in dextrose 5 % 100 mL IVPB  Status:   Discontinued        10 mg/kg  68.9 kg 113.8 mL/hr over 60 Minutes Intravenous Every 12 hours 02/14/20 1245 02/22/20 1537   02/14/20 1400  vancomycin (VANCOREADY) IVPB 1250 mg/250 mL        1,250 mg 166.7 mL/hr over 90 Minutes Intravenous STAT 02/14/20 1352 02/14/20 1655   02/14/20 1300  cefTRIAXone (ROCEPHIN) 2 g in sodium chloride 0.9 % 100 mL IVPB  Status:  Discontinued        2 g 200 mL/hr over 30 Minutes Intravenous Every 12 hours 02/14/20 1245 02/20/20 1732   02/14/20 1300  ampicillin (OMNIPEN) 2 g in sodium chloride 0.9 % 100 mL IVPB  Status:  Discontinued        2 g 300 mL/hr over 20 Minutes Intravenous Every 6 hours 02/14/20 1245 02/18/20 1051      Medications: Scheduled Meds: . amLODipine  10 mg Oral Daily  . aspirin  81 mg Oral Daily  . atorvastatin  80 mg Oral Daily  .  Chlorhexidine Gluconate Cloth  6 each Topical Daily  . dexamethasone (DECADRON) injection  18 mg Intravenous Daily  . dextrose  10 mL Intravenous Q24H  . dextrose  10 mL Intravenous Q24H  . docusate  100 mg Oral BID  . ethambutol  1,200 mg Oral Daily  . fenofibrate  160 mg Oral Daily  . heparin  5,000 Units Subcutaneous Q8H  . insulin aspart  0-9 Units Subcutaneous Q4H  . isoniazid  300 mg Oral Daily  . levETIRAcetam  1,000 mg Oral Q12H  . metoprolol succinate  50 mg Oral Daily  . multivitamin with minerals  1 tablet Oral Daily  . pantoprazole sodium  40 mg Oral Daily  . polyethylene glycol  17 g Oral Daily  . potassium chloride  40 mEq Oral Daily  . pyrazinamide  1,500 mg Oral Daily  . vitamin B-6  50 mg Oral Daily  . sodium chloride  500 mL Intravenous Q24H  . sodium chloride  500 mL Intravenous Q24H  . vitamin B-12  1,000 mcg Oral Daily   Continuous Infusions: . sodium chloride Stopped (02/24/20 0116)  . sodium chloride Stopped (02/24/20 0218)  . amphotericin  B  Liposome (AMBISOME) ADULT IV 310 mg (02/26/20 2157)  . rifampin (RIFADIN) IVPB 600 mg (02/27/20 1110)   PRN Meds:.sodium  chloride, acetaminophen, albuterol, diphenhydrAMINE **OR** diphenhydrAMINE, docusate, labetalol, meperidine (DEMEROL) injection, Resource ThickenUp Clear    Objective: Weight change:   Intake/Output Summary (Last 24 hours) at 02/27/2020 1154 Last data filed at 02/27/2020 0919 Gross per 24 hour  Intake 320 ml  Output 1950 ml  Net -1630 ml   Blood pressure (!) 146/72, pulse (!) 55, temperature 97.6 F (36.4 C), resp. rate 16, height 5\' 10"  (1.778 m), weight 63.3 kg, SpO2 98 %. Temp:  [97.4 F (36.3 C)-99.4 F (37.4 C)] 97.6 F (36.4 C) (10/20 1146) Pulse Rate:  [49-96] 55 (10/20 1146) Resp:  [16-20] 16 (10/20 1146) BP: (134-160)/(68-85) 146/72 (10/20 1146) SpO2:  [98 %-100 %] 98 % (10/20 1146) Weight:  [63.3 kg] 63.3 kg (10/20 0001)  Physical Exam: General: Awake and interactive.  Thinks he is in [place] the [place] is [place] but he knows he is in a hospital  [facility] regular rate, normal  Chest: , no wheezing, no respiratory distress Abdomen: soft non-distended,  Extremities: no edema or deformity noted bilaterally Skin: no rashes Neuro: nonfocal  CBC:    BMET Recent Labs    02/26/20 0240 02/27/20 0501  NA 130* 132*  K 3.9 4.9  CL 100 105  CO2 19* 16*  GLUCOSE 88 99  BUN 30* 33*  CREATININE 1.35* 1.30*  CALCIUM 9.3 9.4     Liver Panel  No results for input(s): PROT, ALBUMIN, AST, ALT, ALKPHOS, BILITOT, BILIDIR, IBILI in the last 72 hours.     Sedimentation Rate No results for input(s): ESRSEDRATE in the last 72 hours. C-Reactive Protein No results for input(s): CRP in the last 72 hours.  Micro Results: Recent Results (from the past 720 hour(s))  Respiratory Panel by RT PCR (Flu A&B, Covid) - Nasopharyngeal Swab     Status: None   Collection Time: 02/13/20  4:17 PM   Specimen: Nasopharyngeal Swab  Result Value Ref Range Status   SARS Coronavirus 2 by RT PCR NEGATIVE NEGATIVE Final    Comment: (NOTE) SARS-CoV-2 target nucleic acids  are NOT DETECTED.  The SARS-CoV-2 RNA is generally detectable in upper respiratoy specimens during the acute phase of infection.  The lowest concentration of SARS-CoV-2 viral copies this assay can detect is 131 copies/mL. A negative result does not preclude SARS-Cov-2 infection and should not be used as the sole basis for treatment or other patient management decisions. A negative result may occur with  improper specimen collection/handling, submission of specimen other than nasopharyngeal swab, presence of viral mutation(s) within the areas targeted by this assay, and inadequate number of viral copies (<131 copies/mL). A negative result must be combined with clinical observations, patient history, and epidemiological information. The expected result is Negative.  Fact Sheet for Patients:  PinkCheek.be  Fact Sheet for Healthcare Providers:  GravelBags.it  This test is no t yet approved or cleared by the [place] FDA and  has been authorized for detection and/or diagnosis of SARS-CoV-2 by FDA under an Emergency Use Authorization (EUA). This EUA will remain  in effect (meaning this test can be used) for the duration of the COVID-19 declaration under Section 564(b)(1) of the Act, 21 U.S.C. section 360bbb-3(b)(1), unless the authorization is terminated or revoked sooner.     Influenza A by PCR NEGATIVE NEGATIVE Final   Influenza B by PCR NEGATIVE NEGATIVE Final    Comment: (NOTE) The Xpert Xpress SARS-CoV-2/FLU/RSV assay is intended as an aid in  the diagnosis of influenza from Nasopharyngeal swab specimens and  should not be used as a sole basis for treatment. Nasal washings and  aspirates are unacceptable for Xpert Xpress SARS-CoV-2/FLU/RSV  testing.  Fact Sheet for Patients: PinkCheek.be  Fact Sheet for Healthcare Providers: GravelBags.it  This test is not  yet approved or cleared by the [place] FDA and  has been authorized for detection and/or diagnosis of SARS-CoV-2 by  FDA under an Emergency Use Authorization (EUA). This EUA will remain  in effect (meaning this test can be used) for the duration of the  Covid-19 declaration under Section 564(b)(1) of the Act, 21  U.S.C. section 360bbb-3(b)(1), unless the authorization is  terminated or revoked. Performed at [facility], [place] [address], [place], [place] [address]   Culture, blood (Routine X 2) w Reflex to ID Panel     Status: None   Collection Time: 02/13/20  4:57 PM   Specimen: BLOOD LEFT FOREARM  Result Value Ref Range Status   Specimen Description BLOOD LEFT FOREARM  Final   Special Requests   Final    BOTTLES DRAWN AEROBIC AND ANAEROBIC Blood Culture results may not be optimal due to an excessive volume of blood received in culture bottles   Culture   Final    NO GROWTH 5 DAYS Performed at [facility], [place] [address] [place], [place] [address]    Report Status 02/18/2020 FINAL  Final  Culture, blood (Routine X 2) w Reflex to ID Panel     Status: None   Collection Time: 02/13/20  5:02 PM   Specimen: BLOOD LEFT WRIST  Result Value Ref Range Status   Specimen Description BLOOD LEFT WRIST  Final   Special Requests   Final    BOTTLES DRAWN AEROBIC AND ANAEROBIC Blood Culture results may not be optimal due to an excessive volume of blood received in culture bottles   Culture   Final    NO GROWTH 5 DAYS Performed at [facility], [place] [address], [place], [place] [address]    Report Status 02/18/2020 FINAL  Final  MRSA PCR Screening     Status: Abnormal   Collection Time: 02/14/20  3:03 AM  Specimen: Nasal Mucosa; Nasopharyngeal  Result Value Ref Range Status   MRSA by PCR POSITIVE (A) NEGATIVE Final    Comment:        The GeneXpert MRSA Assay (FDA approved for NASAL specimens only), is one component of a comprehensive MRSA colonization surveillance  program. It is not intended to diagnose MRSA infection nor to guide or monitor treatment for MRSA infections. RESULT CALLED TO, READ BACK BY AND VERIFIED WITH: Lillie Fragmin RN 8:25 02/14/20 ([email or web address]) Performed at [facility], [place] [address], [place], [place] [address]   CSF culture     Status: None   Collection Time: 02/14/20 11:01 AM   Specimen: CSF; Cerebrospinal Fluid  Result Value Ref Range Status   Specimen Description CSF  Final   Special Requests NONE  Final   Gram Stain   Final    WBC PRESENT,BOTH PMN AND MONONUCLEAR NO ORGANISMS SEEN CYTOSPIN SMEAR    Culture   Final    NO GROWTH 3 DAYS Performed at [facility], [place] [address] [place], [place] [address]    Report Status 02/17/2020 FINAL  Final  Acid Fast Smear (AFB)     Status: None   Collection Time: 02/17/20  1:11 PM   Specimen: PATH Cytology CSF  Result Value Ref Range Status   AFB Specimen Processing Concentration  Final   Acid Fast Smear Negative  Final    Comment: (NOTE) Performed At: [facility] [place], [place] [id number] Rush Farmer MD YW:[id number]    Source (AFB) CSF  Final    Comment: Performed at [facility], [place] [address] [place], [place] [address]  Culture, fungus without smear     Status: None (Preliminary result)   Collection Time: 02/17/20  1:14 PM   Specimen: CSF; Cerebrospinal Fluid  Result Value Ref Range Status   Specimen Description CSF  Final   Special Requests NONE  Final   Culture   Final    NO FUNGUS ISOLATED AFTER 8 DAYS Performed at [facility], [place] [address] [place], [place] [address]    Report Status PENDING  Incomplete  Culture, fungus without smear     Status: Abnormal (Preliminary result)   Collection Time: 02/22/20 11:38 AM   Specimen: CSF; Other  Result Value Ref Range Status   Specimen Description CSF TUBE 2  Final   Special Requests Immunocompromised  Final   Culture (A)  Final    [facility] CRITICAL  RESULT CALLED TO, READ BACK BY AND VERIFIED WITH: DR Drucilla Schmidt @0808  02/27/20 EB Performed at [facility], [address] [place], [place] [address]    Report Status PENDING  Incomplete  Acid Fast Smear (AFB)     Status: None   Collection Time: 02/22/20 11:38 AM   Specimen: CSF; Cerebrospinal Fluid  Result Value Ref Range Status   AFB Specimen Processing Concentration  Final   Acid Fast Smear Negative  Final    Comment: (NOTE) Performed At: [facility] [place], [place] [id number] Rush Farmer MD [id number]    Source (AFB) CSF  Final    Comment: TUBE 2 Performed at [facility], [address] [place],  [address]   MTB RIF NAA Non-Sputum, w/o Culture     Status: None (Preliminary result)   Collection Time: 02/22/20 11:38 AM  Result Value Ref Range Status   Source CSF  Final    Comment: Performed at [facility], [place] [address] [place], [place] [address]   Specimen Source PENDING  Incomplete   M tuberculosis complex PENDING  Incomplete   Rifampin PENDING  Incomplete   AFB Specimen Processing Concentration  Final    Comment: (NOTE) Performed At: [facility] [place], [place] [id number] Rush Farmer MD GY:[id number]     Studies/Results: MR BRAIN W WO CONTRAST  Result Date: 02/26/2020 CLINICAL DATA:  74 year old male with seizures, meningitis. Abnormal signal in the left thalamus, mesial left temporal lobe and splenium earlier this month. EXAM: MRI HEAD WITHOUT AND WITH CONTRAST TECHNIQUE: Multiplanar, multiecho pulse sequences of the brain and surrounding structures were obtained without and with intravenous contrast. CONTRAST:  85mL GADAVIST GADOBUTROL 1 MMOL/ML IV SOLN COMPARISON:  CTA head and neck 02/18/2011. Brain MRI 02/14/2020. Head CT 02/16/2020 and earlier. FINDINGS: Brain: Partially faded globular abnormal DWI signal in the left thalamus which was clearly restricted on 02/14/2020. Continued  subset area of restriction on ADC today. Mild associated T2 and FLAIR hyperintensity. No hemorrhage or mass effect. Largely resolved abnormal diffusion signal in the left hippocampal complex (perhaps trace residual on series 4, image 19) with subtle residual T2 hyperintensity there. Punctate signal abnormality on trace DWI at the midline splenium is unchanged, with no convincing associated T2 or FLAIR hyperintensity. Following contrast, there is confluent enhancement in the left thalamus corresponding to the shape of restricted diffusion on the prior study (series 11, image 30). No enhancement of the left hippocampus. No definite enhancement in the splenium, and no other abnormal parenchymal enhancement identified. No Pachymeningeal thickening or enhancement identified. But there is suggestion of abnormal enhancement in the left internal auditory canal now (series 11, image 17 and series 13, image 16), which seems new from earlier this month and questionably associated with a dark T2 filling defect at the porous acusticus (series 6, image 9). No other leptomeningeal enhancement identified. No midline shift, mass effect, ventriculomegaly, extra-axial collection or acute intracranial hemorrhage. Cervicomedullary junction and pituitary are within normal limits. Background gray and white matter signal is largely normal for age, with mild nonspecific periventricular T2 and FLAIR hyperintensity. No cortical encephalomalacia. No chronic cerebral blood products identified. Vascular: Major intracranial vascular flow voids are stable. The major dural venous sinuses are enhancing and appear to be patent. Skull and upper cervical spine: Negative visible cervical spine, spinal cord. Visualized bone marrow signal is within normal limits. Sinuses/Orbits: Stable, negative. Other: Decreasing bilateral mastoid effusions, trace now. No residual fluid in the visible pharynx. There is a new right periorbital scalp hematoma from the  prior MRI (series 6, image 14). IMPRESSION: 1. 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 2. Resolved abnormal diffusion in the left hippocampal formation with mild residual T2 hyperintensity. Punctate abnormal diffusion in the splenium of the corpus callosum is unchanged. 3. Questionable new leptomeningeal enhancement in the left internal auditory canal, but no other leptomeningeal or pachymeningeal abnormality is identified. 4. No other new intracranial abnormality. Electronically Signed   By: Genevie Ann M.D.   On: 02/26/2020 19:34      Assessment/Plan:  INTERVAL HISTORY:   MRI completed Principal Problem:   Aseptic meningitis Active Problems:   Seizure ([place])   Weight loss   Encounter for nasogastric (NG) tube placement    Howard Allen is a 74 y.o. 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  HSV 1 and 2 PCR's were negative x2 CSF cultures originally taken were without growth.  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  MRI brain shows.  1. 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  2. Resolved abnormal diffusion in the left hippocampal formation with mild residual T2 hyperintensity. Punctate abnormal diffusion in the splenium of the corpus callosum is unchanged.  3. Questionable new leptomeningeal enhancement in the left internal auditory canal, but no other leptomeningeal or pachymeningeal abnormality is identified.  I would plan on repeat LP tomorrow, Friday or Monday if safe  to send large volume for CSF cytology and flow cytometry     LOS: 14 days   Alcide Evener 02/27/2020, 11:54 AM

## 2020-02-27 NOTE — Progress Notes (Signed)
PROGRESS NOTE    Howard Allen  JGO:[id number] DOB: [birth year] DOA: 02/13/2020 PCP: System, Provider Not In   Brief Narrative: The patient is a 74 year old Caucasian male with a past medical history significant for Crohn's disease, hypertension, hyperlipidemia, depression history of hospitalization in June 2021 for pneumatosis status post ileocolonic resection and lysis of adhesions who presented with multiple tonic-clonic seizures complicated by status epilepticus that required intubation and loading with Keppra. CT of the head was negative and initially when he was brought in to the ED by EMS there was at least 3 witnessed generalized tonic-clonic seizures noted. PCCM was consulted for admission given that he was intubated on arrival and unresponsive. He was intubated on 02/13/2020 and extubated 02/15/2020. Neurology was consulted and he underwent further work-up and was found to have a signal abnormality in the left limb is most consistent with an acute infarct. Echocardiogram done showed an EF of 55 to 60% with moderate LVH and grade 1 diastolic dysfunction. EEG testing on 10/9 was negative. 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 He underwent several viral serologies and had a lumbar puncture sent on 02/14/2020. He was started on IV vancomycin, IV ceftriaxone, IV ampicillin and IV acyclovir for suspected meningitis. CSF analysis was consistent with aseptic meningitis with high white blood cells and very low glucose level. CSF culture was initally negative. He was transferred to [facility] service on 02/19/2020 and remained confused. Subsequently ID was consulted and they were suspecting aseptic meningoencephalitis and recommended large volume LP. Patient was already on aspirin and Plavix for past 5 days. His Plavix was stopped on 02/20/2020. P2 Y 12 was checked and based on that, neurology performed another large-volume LP on 02/22/2020 and several labs were sent by ID. 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   Assessment & Plan:   Principal Problem:   Aseptic meningitis Active Problems:   Seizure ([place])   Weight loss   Encounter for nasogastric (NG) tube placement   Aseptic meningitis  - LP performed on 10/7 consistent with meningitis - LP Cultures positive for candida as of 02/27/20 - already on amphotericin B - ID following - appreciate insight and recommendations - VDLR and cryptococcal antigen negative., and [place] serology pending - Quantiferon gold was indeterminate.  Patient was on vancomycin, ceftriaxone and acyclovir.  Consulted ID on 02/20/2020.  Dr. Tommy Medal recommended repeat large-volume LP to send more labs. Rocephin and vancomycin discontinued but acyclovir continues.  Had a long multidisciplinary discussion between me, Dr. Tommy Medal and Dr. Hortense Ramal of neurology on 02/21/2020.  - Patient finally underwent large-volume LP by neurology on 02/22/2020. 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   - HCV PCR once again negative.   - Plavix on hold as ID plans to do third LP  Large hematoma above right eye:  - He developed large hematoma on the morning of 02/22/2020 after he is continued to pluck his right forehead laceration.  - Initially he was unable to open his eye but there was no pressure on the eyeball. For this reason, I had consulted ENT and he was evaluated by them but they did not feel the need to evacuate hematoma.  - Patient's hematoma is decreasing in size and he is able to open about 90% of his right  eye.  No more bleeding.  Acute Ischemic Infarct  - Seen on MRI imaging on 10/7: left thalamic signal abnormality measuring 2.3 cm most consistent with acute infarct; no edema or hemorrhagic conversion. (neuro suspects 2/2 prolonged seizure) -  Will need outpt MRI in 8-12 weeks to help delineate this -outpt neuro f/u as well - CTA head and neck without large vessel occlusion or proximal stenosis, small ICA aneurysm 2 mm, right upper pulmonary nodule 5 mm, recommend outpatient follow-up imaging per protocol. - Now on atorvastatin 80 mg despite that he takes 20 at home given his acute CVA - TTE negative for vegetations or thrombosis.  - Continue high intensity statin; Lipid panel done and showed a total cholesterol/HDL ratio 3.3, cholesterol level 154, HDL level 46, LDL 79, triglycerides 146, VLDL 29 - PT/OT recommending CIR - appreciate neurology evaluation/recommendations.  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  Hypomagnesemia - Resolved.  Hypophosphatemia - Resolved.  B12 Deficiency  Moderate Malnutrition - Likely due to malnutrition and anorexia, possibly worsened by malabsorption given bowel inflammation seen on imaging the past several months prior to resection. Of note, 70 lb weight loss in the past 10 months.  - Consult to dietician for tube feed initiation, he was started on tube feedings which was discontinued now that he is tolerating dysphagia 2 diet. Nutritionist now recommending hormonal shake twice daily as well as Magic cup twice daily with meals and multivitamin with minerals daily.  HTN/HLD -Home medications include Toprol 100 mg, Amlodipine 5 mg, Lipitor 20 mg, Fenofibrate 160 mg.  Blood pressure very well controlled.  Will continue amlodipine to 10 mg and Toprol-XL 100 mg p.o. daily.   Normocytic Anemia  Hx of Iron Deficiency Anemia B12 Deficiency  - B12 levels low. Iron panel shows low saturation, however normal iron and TIBC, unclear picture as not consistent completely with iron deficiency anemia or anemia of chronic  disease.  Hemoglobin is stable.  Questionable Crohn's Disease s/p Ileocolic Resection  - No longer on immunosuppressive therapy. -Takes budesonide as an outpatient -currently on hold  AKI on CKD stage II Slight jump in creatinine.  Likely due to dehydration.  Will start on gentle hydration normal saline 75 cc/h for 24 hours.  Repeat labs in the morning.  DVT prophylaxis: SCDs Heparin 5000 units subcu every 8 hours Code Status: FULL CODE  Family Communication: Daughter Threasa Beards updated Disposition Plan: Plan to discharge to CIR once cleared by ID and neurology.  Status is: Inpatient  Remains inpatient appropriate because:Altered mental status, Unsafe d/c plan, IV treatments appropriate due to intensity of illness or inability to take PO and Inpatient level of care appropriate due to severity of illness  Dispo: The patient is from: Home              Anticipated d/c is to: CIR              Anticipated d/c date is: 2-3 days              Patient currently is not medically stable to d/c.  Consultants:   Neurology  Infectious disease  PCCM Transfer   Procedures:  10/6 >> ETT placed 10/7 >> LP 10/8 >> Extubated   Significant Diagnostic Tests:  10/6 CT Head >> No acute intracranial abnormalities; age-related volume loss and chronic small vessel disease  10/7 MRI brain: 1. Signal abnormality in the left thalamus most consistent with an acute infarct. 2. Signal abnormality in the mesial left temporal lobe and splenium of the corpus callosum which may reflect acute infarct, sequelae of recent seizure activity, or encephalitis (including herpes encephalitis). No contralateral cerebral involvement. 3. Mild chronic small vessel ischemic disease. 10/8 echo: LVEF 55-60%, mod LVH, Grade I diastolic dysfunction 54/6 CT Head >> Progressive low density in the left thalamus related to acute infarction; no hemorrhage 10/9 EEG >> Negative  Micro Data:  10/6 COVID-19 + influenza >>  Negative  10/6 Blood Cultures >> NGTD 10/7 CSF Culture >> NGTD 10/7 HSV serology >> 2.02 on igg testing. Will send pcr to determine past or active.  10/7 West Nile serology >>  10/7 VRDL serology >> 10/8 Quanitferon Gold  >> indeterminant 10/9 acid fast csf: Pending 10/11 HSV PCR: pending  10/15 -CSF culture -Candida albicans  Antimicrobials:  Anti-infectives (From admission, onward)   Start     Dose/Rate Route Frequency Ordered Stop   02/26/20 1115  rifampin (RIFADIN) 600 mg in sodium chloride 0.9 % 100 mL IVPB        600 mg 200 mL/hr over 30 Minutes Intravenous Every 24 hours 02/26/20 1026     02/23/20 0100  acyclovir (ZOVIRAX) 615 mg in dextrose 5 % 100 mL IVPB  Status:  Discontinued        10 mg/kg  61.3 kg 112.3 mL/hr over 60 Minutes Intravenous Every 12 hours 02/22/20 1537 02/24/20 0936   02/22/20 2100  amphotericin B liposome (AMBISOME) 310 mg in dextrose 5 % 500 mL IVPB        5 mg/kg  61.3 kg 288.8 mL/hr over 120 Minutes Intravenous Every 24 hours 02/22/20 1524     02/22/20 1800  rifampin (RIFADIN) capsule 600 mg  Status:  Discontinued        600 mg Oral Daily 02/22/20 1520 02/26/20 1026   02/22/20 1800  isoniazid (NYDRAZID) tablet 300 mg        300 mg Oral Daily 02/22/20 1520     02/22/20 1800  pyrazinamide tablet 1,500 mg        1,500 mg Oral Daily 02/22/20 1520     02/22/20 1800  ethambutol (MYAMBUTOL) tablet 1,200 mg        1,200 mg Oral Daily 02/22/20 1520     02/20/20 1000  vancomycin (VANCOCIN) IVPB 1000 mg/200 mL premix  Status:  Discontinued        1,000 mg 200 mL/hr over 60 Minutes Intravenous Every 24 hours 02/20/20 0555 02/21/20 1259   02/19/20 1811  vancomycin variable dose per unstable renal function (pharmacist dosing)  Status:  Discontinued         Does not apply See admin instructions 02/19/20 1811 02/20/20 0555   02/18/20 0500  vancomycin (VANCOREADY) IVPB 750 mg/150 mL  Status:  Discontinued        750 mg 150 mL/hr over 60 Minutes Intravenous Every  12 hours 02/17/20 1651 02/19/20 1811   02/15/20 0300  vancomycin (VANCOREADY) IVPB 500 mg/100 mL        500 mg 100 mL/hr over 60 Minutes Intravenous Every 12 hours 02/14/20 1352 02/17/20 1755   02/14/20 1400  acyclovir (ZOVIRAX) 690 mg in dextrose 5 % 100 mL IVPB  Status:  Discontinued        10 mg/kg  68.9 kg 113.8 mL/hr over 60 Minutes Intravenous Every 12 hours 02/14/20 1245 02/22/20 1537   02/14/20  1400  vancomycin (VANCOREADY) IVPB 1250 mg/250 mL        1,250 mg 166.7 mL/hr over 90 Minutes Intravenous STAT 02/14/20 1352 02/14/20 1655   02/14/20 1300  cefTRIAXone (ROCEPHIN) 2 g in sodium chloride 0.9 % 100 mL IVPB  Status:  Discontinued        2 g 200 mL/hr over 30 Minutes Intravenous Every 12 hours 02/14/20 1245 02/20/20 1732   02/14/20 1300  ampicillin (OMNIPEN) 2 g in sodium chloride 0.9 % 100 mL IVPB  Status:  Discontinued        2 g 300 mL/hr over 20 Minutes Intravenous Every 6 hours 02/14/20 1245 02/18/20 1051       Subjective: No acute issues or events overnight, patient awake alert oriented to person and general situation, correctly recalls his home city state, family members but has difficulty orienting to place and time.  Answers most questions with "I do not know" otherwise.  Review of systems somewhat limited.  Objective: Vitals:   02/26/20 1659 02/26/20 2001 02/27/20 0001 02/27/20 0403  BP: 136/80 (!) 160/82 (!) 150/85 (!) 157/68  Pulse: (!) 54 (!) 55 (!) 56 96  Resp: 20 18 19 18   Temp: (!) 97.4 F (36.3 C) 97.8 F (36.6 C) 99.4 F (37.4 C) (!) 97.5 F (36.4 C)  TempSrc: Oral Oral Oral Oral  SpO2: 100% 100% 100% 100%  Weight:   63.3 kg   Height:        Intake/Output Summary (Last 24 hours) at 02/27/2020 0715 Last data filed at 02/27/2020 0403 Gross per 24 hour  Intake 400 ml  Output 1500 ml  Net -1100 ml   Filed Weights   02/21/20 0322 02/24/20 0415 02/27/20 0001  Weight: 61.3 kg 66.7 kg 63.3 kg   Examination:  General:  Pleasantly resting in bed,  No acute distress.  Alert to person only HEENT: Large ecchymosis and hematoma over right lateral eye.  Sclerae nonicteric, noninjected.  Extraocular movements intact bilaterally. Neck:  Without mass or deformity.  Trachea is midline. Lungs:  Clear to auscultate bilaterally without rhonchi, wheeze, or rales. Heart:  Regular rate and rhythm.  Without murmurs, rubs, or gallops. Abdomen:  Soft, nontender, nondistended.  Without guarding or rebound. Extremities: Without cyanosis, clubbing, edema, or obvious deformity. Vascular:  Dorsalis pedis and posterior tibial pulses palpable bilaterally. Skin:  Warm and dry, no erythema, no ulcerations.   Data Reviewed: I have personally reviewed following labs and imaging studies  CBC: Recent Labs  Lab 02/21/20 0814  WBC 7.4  NEUTROABS 5.5  HGB 10.4*  HCT 32.6*  MCV 88.6  PLT 390   Basic Metabolic Panel: Recent Labs  Lab 02/23/20 0341 02/24/20 0256 02/25/20 0216 02/26/20 0240 02/26/20 0803 02/27/20 0501  NA 135 132* 132* 130*  --  132*  K 4.2 4.3 4.1 3.9  --  4.9  CL 103 104 106 100  --  105  CO2 20* 20* 17* 19*  --  16*  GLUCOSE 106* 83 100* 88  --  99  BUN 19 23 25* 30*  --  33*  CREATININE 1.24 1.21 1.23 1.35*  --  1.30*  CALCIUM 9.3 8.8* 9.1 9.3  --  9.4  MG 1.5* 1.8 1.7  --  2.4 2.0  PHOS 3.4 3.2 3.7 5.8*  --  6.0*   GFR: Estimated Creatinine Clearance: 46 mL/min (A) (by C-G formula based on SCr of 1.3 mg/dL (H)). Liver Function Tests: Recent Labs  Lab 02/23/20 0341  AST  22  ALT 18  ALKPHOS 36*  BILITOT 0.5  PROT 5.9*  ALBUMIN 3.2*   No results for input(s): LIPASE, AMYLASE in the last 168 hours. No results for input(s): AMMONIA in the last 168 hours. Coagulation Profile: No results for input(s): INR, PROTIME in the last 168 hours. Cardiac Enzymes: No results for input(s): CKTOTAL, CKMB, CKMBINDEX, TROPONINI in the last 168 hours. BNP (last 3 results) No results for input(s): PROBNP in the last 8760  hours. HbA1C: No results for input(s): HGBA1C in the last 72 hours. CBG: Recent Labs  Lab 02/26/20 1653 02/26/20 2000 02/26/20 2305 02/27/20 0340 02/27/20 0423  GLUCAP 108* 91 100* 66* 91   Lipid Profile: No results for input(s): CHOL, HDL, LDLCALC, TRIG, CHOLHDL, LDLDIRECT in the last 72 hours. Thyroid Function Tests: No results for input(s): TSH, T4TOTAL, FREET4, T3FREE, THYROIDAB in the last 72 hours. Anemia Panel: No results for input(s): VITAMINB12, FOLATE, FERRITIN, TIBC, IRON, RETICCTPCT in the last 72 hours. Sepsis Labs: No results for input(s): PROCALCITON, LATICACIDVEN in the last 168 hours.  Recent Results (from the past 240 hour(s))  Acid Fast Smear (AFB)     Status: None   Collection Time: 02/17/20  1:11 PM   Specimen: PATH Cytology CSF  Result Value Ref Range Status   AFB Specimen Processing Concentration  Final   Acid Fast Smear Negative  Final    Comment: (NOTE) Performed At: [facility] [place], [place] [id number] Rush Farmer MD [id number]    Source (AFB) CSF  Final    Comment: Performed at [facility], [place] [address] [place], [place] [address]  Culture, fungus without smear     Status: None (Preliminary result)   Collection Time: 02/17/20  1:14 PM   Specimen: CSF; Cerebrospinal Fluid  Result Value Ref Range Status   Specimen Description CSF  Final   Special Requests NONE  Final   Culture   Final    NO FUNGUS ISOLATED AFTER 7 DAYS Performed at [facility], [place] [address], [place], [place] [address]    Report Status PENDING  Incomplete  Culture, fungus without smear     Status: Abnormal (Preliminary result)   Collection Time: 02/22/20 11:38 AM   Specimen: CSF; Other  Result Value Ref Range Status   Specimen Description CSF TUBE 2  Final   Special Requests   Final    Immunocompromised Performed at [facility], [place] [address], [place], [place] [address]    Culture CANDIDA ALBICANS (A)  Final    Report Status PENDING  Incomplete  Acid Fast Smear (AFB)     Status: None   Collection Time: 02/22/20 11:38 AM   Specimen: CSF; Cerebrospinal Fluid  Result Value Ref Range Status   AFB Specimen Processing Concentration  Final   Acid Fast Smear Negative  Final    Comment: (NOTE) Performed At: [facility] [place], [place] [id number] Rush Farmer MD HR:[id number]    Source (AFB) CSF  Final    Comment: TUBE 2 Performed at [facility], [address], [place], [place] [address]   MTB RIF NAA Non-Sputum, w/o Culture     Status: None (Preliminary result)   Collection Time: 02/22/20 11:38 AM  Result Value Ref Range Status   Source CSF  Final    Comment: Performed at [facility], [place] [address] [place], [place] [address]   Specimen Source PENDING  Incomplete   M  tuberculosis complex PENDING  Incomplete   Rifampin PENDING  Incomplete   AFB Specimen Processing Concentration  Final    Comment: (NOTE) Performed At: [facility] [place], [place] [id number] Rush Farmer MD JS:[id number]      RN Pressure Injury Documentation:     Estimated body mass index is 20.02 kg/m as calculated from the following:   Height as of this encounter: 5\' 10"  (1.778 m).   Weight as of this encounter: 63.3 kg.  Malnutrition Type:  Nutrition Problem: Moderate Malnutrition   Malnutrition Characteristics:  Signs/Symptoms: mild fat depletion, moderate fat depletion, mild muscle depletion, moderate muscle depletion   Nutrition Interventions:  Interventions: Hormel Shake, Magic cup, MVI   Radiology Studies: MR BRAIN W WO CONTRAST  Result Date: 02/26/2020 CLINICAL DATA:  74 year old male with seizures, meningitis. Abnormal signal in the left thalamus, mesial left temporal lobe and splenium earlier this month. EXAM: MRI HEAD WITHOUT AND WITH CONTRAST TECHNIQUE: Multiplanar, multiecho pulse sequences of the brain and surrounding structures  were obtained without and with intravenous contrast. CONTRAST:  17mL GADAVIST GADOBUTROL 1 MMOL/ML IV SOLN COMPARISON:  CTA head and neck 02/18/2011. Brain MRI 02/14/2020. Head CT 02/16/2020 and earlier. FINDINGS: Brain: Partially faded globular abnormal DWI signal in the left thalamus which was clearly restricted on 02/14/2020. Continued subset area of restriction on ADC today. Mild associated T2 and FLAIR hyperintensity. No hemorrhage or mass effect. Largely resolved abnormal diffusion signal in the left hippocampal complex (perhaps trace residual on series 4, image 19) with subtle residual T2 hyperintensity there. Punctate signal abnormality on trace DWI at the midline splenium is unchanged, with no convincing associated T2 or FLAIR hyperintensity. Following contrast, there is confluent enhancement in the left thalamus corresponding to the shape of restricted diffusion on the prior study (series 11, image 30). No enhancement of the left hippocampus. No definite enhancement in the splenium, and no other abnormal parenchymal enhancement identified. No Pachymeningeal thickening or enhancement identified. But there is suggestion of abnormal enhancement in the left internal auditory canal now (series 11, image 17 and series 13, image 16), which seems new from earlier this month and questionably associated with a dark T2 filling defect at the porous acusticus (series 6, image 9). No other leptomeningeal enhancement identified. No midline shift, mass effect, ventriculomegaly, extra-axial collection or acute intracranial hemorrhage. Cervicomedullary junction and pituitary are within normal limits. Background gray and white matter signal is largely normal for age, with mild nonspecific periventricular T2 and FLAIR hyperintensity. No cortical encephalomalacia. No chronic cerebral blood products identified. Vascular: Major intracranial vascular flow voids are stable. The major dural venous sinuses are enhancing and appear to  be patent. Skull and upper cervical spine: Negative visible cervical spine, spinal cord. Visualized bone marrow signal is within normal limits. Sinuses/Orbits: Stable, negative. Other: Decreasing bilateral mastoid effusions, trace now. No residual fluid in the visible pharynx. There is a new right periorbital scalp hematoma from the prior MRI (series 6, image 14). IMPRESSION: 1. 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 2. Resolved abnormal diffusion in the left hippocampal formation with mild residual T2 hyperintensity. Punctate abnormal diffusion in the splenium of the corpus callosum is unchanged. 3. Questionable new leptomeningeal enhancement in the left internal auditory canal, but no other leptomeningeal or pachymeningeal abnormality is identified. 4. No other new intracranial abnormality. Electronically Signed   By: Genevie Ann M.D.   On: 02/26/2020 19:34   Scheduled Meds: . amLODipine  10  mg Oral Daily  . aspirin  81 mg Oral Daily  . atorvastatin  80 mg Oral Daily  . Chlorhexidine Gluconate Cloth  6 each Topical Daily  . dexamethasone (DECADRON) injection  18 mg Intravenous Daily  . dextrose  10 mL Intravenous Q24H  . dextrose  10 mL Intravenous Q24H  . docusate  100 mg Oral BID  . ethambutol  1,200 mg Oral Daily  . fenofibrate  160 mg Oral Daily  . heparin  5,000 Units Subcutaneous Q8H  . insulin aspart  0-9 Units Subcutaneous Q4H  . isoniazid  300 mg Oral Daily  . levETIRAcetam  1,000 mg Oral Q12H  . metoprolol succinate  50 mg Oral Daily  . multivitamin with minerals  1 tablet Oral Daily  . pantoprazole sodium  40 mg Oral Daily  . polyethylene glycol  17 g Oral Daily  . potassium chloride  40 mEq Oral Daily  . pyrazinamide  1,500 mg Oral Daily  . vitamin B-6  50 mg Oral Daily  . sodium chloride  500 mL Intravenous Q24H  . sodium chloride  500 mL Intravenous Q24H  . vitamin B-12  1,000 mcg Oral Daily   Continuous Infusions: .  sodium chloride Stopped (02/24/20 0116)  . sodium chloride Stopped (02/24/20 0218)  . sodium chloride 75 mL/hr at 02/26/20 0959  . amphotericin  B  Liposome (AMBISOME) ADULT IV 310 mg (02/26/20 2157)  . rifampin (RIFADIN) IVPB 600 mg (02/26/20 1138)    LOS: 14 days   Little Ishikawa, DO [facility] Pager: secure chat on epic  If 7PM-7AM, please contact night-coverage [email or web address]

## 2020-02-27 NOTE — Progress Notes (Signed)
Physical Therapy Treatment Patient Details Name: Howard Allen MRN: [id number] DOB: 01/19/1946 Today's Date: 02/27/2020    History of Present Illness Pt is a 74 year old male who was having multiple seizures and has a history of falls. 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 NIH 4 on 02/15/20 and then 14 on 02/17/20. Medical hx consisting of Chron's disease and HTN.    PT Comments    Pt tolerates treatment well with improved transfer quality and initiation of gait training. Pt is generally weak and requires frequent PT cueing for technique during gait and transfers to reduce falls risk. Pt also demonstrates slowed processing and impaired awareness of deficits. Pt will benefit from continued acute PT POC to reduce falls risk. PT continues to recommend SNF placement.  Follow Up Recommendations  Supervision/Assistance - 24 hour;SNF     Equipment Recommendations  Rolling walker with 5" wheels;3in1 (PT);Wheelchair (measurements PT);Wheelchair cushion (measurements PT);Hospital bed    Recommendations for Other Services       Precautions / Restrictions Precautions Precautions: Fall;Other (comment) Precaution Comments: seizures, condom cath Restrictions Weight Bearing Restrictions: No    Mobility  Bed Mobility Overal bed mobility: Needs Assistance Bed Mobility: Supine to Sit     Supine to sit: Min assist     General bed mobility comments: increased time, PT hand hold and HOB elevated  Transfers Overall transfer level: Needs assistance Equipment used: Rolling walker (2 wheeled) Transfers: Sit to/from  Stand Sit to Stand: Min assist;Mod assist         General transfer comment: PT cues for hand placement, forward trunk lean. modA needed to control stand to sit  Ambulation/Gait Ambulation/Gait assistance: Min assist Gait Distance (Feet): 5 Feet Assistive device: Rolling walker (2 wheeled) Gait Pattern/deviations: Step-to pattern Gait velocity: reduced Gait velocity interpretation: <1.31 ft/sec, indicative of household ambulator General Gait Details: pt with short shuffling step to gait pattern, increased trunk flexion and reduced foot clearance   Stairs             Wheelchair Mobility    Modified Rankin (Stroke Patients Only) Modified Rankin (Stroke Patients Only) Pre-Morbid Rankin Score: No symptoms Modified Rankin: Moderately severe disability     Balance Overall balance assessment: History of Falls;Needs assistance Sitting-balance support: Single extremity supported;Feet supported Sitting balance-Leahy Scale: Fair     Standing balance support: Bilateral upper extremity supported Standing balance-Leahy Scale: Poor Standing balance comment: minG with BUE support of RW                            Cognition Arousal/Alertness: Awake/alert Behavior During Therapy: Flat affect Overall Cognitive Status: Impaired/Different from baseline Area of Impairment: Attention;Memory;Following commands;Safety/judgement;Awareness;Problem solving                   Current Attention Level: Sustained Memory: Decreased recall of precautions;Decreased short-term memory Following Commands: Follows one step commands with increased time Safety/Judgement: Decreased awareness of safety;Decreased awareness of deficits Awareness: Intellectual Problem Solving: Slow processing        Exercises      General Comments General comments (skin integrity, edema, etc.):  VSS on RA      Pertinent Vitals/Pain Pain Assessment: No/denies pain    Home Living                       Prior Function            PT Goals (current goals can now be found in the care plan section) Acute Rehab PT Goals Patient Stated Goal: to go home Progress towards PT goals: Progressing toward goals    Frequency    Min 3X/week      PT Plan Current plan remains appropriate    Co-evaluation              AM-PAC PT "6 Clicks" Mobility   Outcome Measure  Help needed turning from your back to your side while in a flat bed without using bedrails?: A Little Help needed moving from lying on your back to sitting on the side of a flat bed without using bedrails?: A Little Help needed moving to and from a bed to a chair (including a wheelchair)?: A Little Help needed standing up from a chair using your arms (e.g., wheelchair or bedside chair)?: A Little Help needed to walk in hospital room?: A Little Help needed climbing 3-5 steps with a railing? : Total 6 Click Score: 16    End of Session Equipment Utilized During Treatment: Gait belt Activity Tolerance: Patient tolerated treatment well Patient left: in chair;with call bell/phone within reach;with chair alarm set Nurse Communication: Mobility status PT Visit Diagnosis: Unsteadiness on feet (R26.81);Other abnormalities of gait and mobility (R26.89);Muscle weakness (generalized) (M62.81);History of falling (Z91.81);Difficulty in walking, not elsewhere classified (R26.2);Other symptoms and signs involving the nervous system (R29.898)     Time: 8546-2703 PT Time Calculation (min) (ACUTE ONLY): 27 min  Charges:  $Gait Training: 8-22 mins $Therapeutic Activity: 8-22 mins                     Zenaida Niece, PT, DPT Acute Rehabilitation Pager: [phone]    Zenaida Niece 02/27/2020, 3:53 PM

## 2020-02-28 ENCOUNTER — Inpatient Hospital Stay (HOSPITAL_COMMUNITY): Payer: Medicare Other

## 2020-02-28 DIAGNOSIS — E876 Hypokalemia: Secondary | ICD-10-CM | POA: Diagnosis not present

## 2020-02-28 DIAGNOSIS — R569 Unspecified convulsions: Secondary | ICD-10-CM | POA: Diagnosis not present

## 2020-02-28 DIAGNOSIS — R634 Abnormal weight loss: Secondary | ICD-10-CM | POA: Diagnosis not present

## 2020-02-28 DIAGNOSIS — G40901 Epilepsy, unspecified, not intractable, with status epilepticus: Secondary | ICD-10-CM | POA: Diagnosis not present

## 2020-02-28 DIAGNOSIS — G030 Nonpyogenic meningitis: Secondary | ICD-10-CM | POA: Diagnosis not present

## 2020-02-28 LAB — PHOSPHORUS: Phosphorus: 6.6 mg/dL — ABNORMAL HIGH (ref 2.5–4.6)

## 2020-02-28 LAB — BASIC METABOLIC PANEL
Anion gap: 10 (ref 5–15)
BUN: 33 mg/dL — ABNORMAL HIGH (ref 8–23)
CO2: 18 mmol/L — ABNORMAL LOW (ref 22–32)
Calcium: 9.3 mg/dL (ref 8.9–10.3)
Chloride: 102 mmol/L (ref 98–111)
Creatinine, Ser: 1.36 mg/dL — ABNORMAL HIGH (ref 0.61–1.24)
GFR, Estimated: 52 mL/min — ABNORMAL LOW (ref >60)
Glucose, Bld: 134 mg/dL — ABNORMAL HIGH (ref 70–99)
Potassium: 4 mmol/L (ref 3.5–5.1)
Sodium: 130 mmol/L — ABNORMAL LOW (ref 135–145)

## 2020-02-28 LAB — GLUCOSE, CAPILLARY
Glucose-Capillary: 50 mg/dL — ABNORMAL LOW (ref 70–99)
Glucose-Capillary: 76 mg/dL (ref 70–99)
Glucose-Capillary: 85 mg/dL (ref 70–99)
Glucose-Capillary: 92 mg/dL (ref 70–99)
Glucose-Capillary: 60 mg/dL — ABNORMAL LOW (ref 70–99)
Glucose-Capillary: 54 mg/dL — ABNORMAL LOW (ref 70–99)
Glucose-Capillary: 54 mg/dL — ABNORMAL LOW (ref 70–99)
Glucose-Capillary: 89 mg/dL (ref 70–99)

## 2020-02-28 LAB — CK: Total CK: 22 U/L — ABNORMAL LOW (ref 49–397)

## 2020-02-28 LAB — MAGNESIUM: Magnesium: 1.8 mg/dL (ref 1.7–2.4)

## 2020-02-28 MED ORDER — GLUCOSE 40 % PO GEL
2.0000 | ORAL | Status: AC
  Administered 2020-02-28: 75 g via ORAL
  Filled 2020-02-28: qty 2

## 2020-02-28 MED ORDER — MAGNESIUM SULFATE 2 GM/50ML IV SOLN
2.0000 g | Freq: Once | INTRAVENOUS | Status: AC
  Administered 2020-02-28: 2 g via INTRAVENOUS
  Filled 2020-02-28: qty 50

## 2020-02-28 MED ORDER — GLUCOSE 40 % PO GEL
ORAL | Status: AC
  Administered 2020-02-28: 37.5 g
  Filled 2020-02-28: qty 1

## 2020-02-28 NOTE — Progress Notes (Signed)
Hypoglycemic Event  CBG: 54  Treatment: 2 tubes glucose gel  Symptoms: None  Follow-up CBG: Time: 2157 CBG Result:54  Possible Reasons for Event: Inadequate meal intake and Medication regimen: unknown  Comments: pt asymptomatic, glucose gel given but no change in CBG, pt feed some dinner food and given thickened OJ. CBG rechecked to be 86. Will continue to closely monitor.   Clarice Bonaventure

## 2020-02-28 NOTE — Progress Notes (Signed)
Pharmacy Antibiotic Note  Howard Allen is a 74 y.o. 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 Thus far, no organism has been identified as a cause of infection.   ID plan for LP in the future- Plavix discontinued.  CSF culture from 10/15 now with Candida albicans.  CSF studies for Coccidioides, Blastomyces, Histoplasma, and paraneoplastic panel pending.    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 Current SCr is slightly increased at 1.36 (BL ~1.2-1.4). Will provide pre and post hydration of amphotericin B with 500 mL NS. Continue to watch renal function and adjust NS if Scr continues to increase.   Potassium remains stable (4 today) and patient is currently on a daily potassium supplement. Mg today is 1.8- will give 2g IV to maintain Mg level > 2.    Plan: Amphotericin B 310 mg (~ 5 mg/kg) every 24 hours  Give 500 mL of normal saline 1 hour prior to dose Flush line with dextrose in-between normal saline and amphotericin B doses Give another 500 mL normal saline after amphotericin B infusion is complete  Administration instructions reviewed with RN Daily K supplement Supplement Mg as needed F/u daily K and Mg levels   Rifampin 600 mg daily Isoniazid 300 mg daily Pyridoxine 50 mg daily Pyrazinamide 1500 mg daily Ethambutol 1200 mg daily Dexamethasone IV 0.3 mg/kg (~18 mg) daily for 2 weeks- Will taper as appropriate pending results of CSF    Height: 5\' 10"  (177.8 cm) Weight: 63 kg (138 lb 14.2 oz) IBW/kg (Calculated) : 73  Temp (24hrs), Avg:97.6 F (36.4 C), Min:97.5 F (36.4 C), Max:97.6 F (36.4 C)  Recent Labs  Lab 02/21/20 0814 02/22/20 0804 02/24/20 0256 02/25/20 0216 02/26/20 0240 02/27/20 0501 02/28/20 0528  WBC 7.4  --   --   --   --   --   --   CREATININE  --    < > 1.21 1.23 1.35* 1.30* 1.36*   < > = values in this interval not displayed.    Estimated Creatinine  Clearance: 43.8 mL/min (A) (by C-G formula based on SCr of 1.36 mg/dL (H)).    Allergies  Allergen Reactions  . Lisinopril Anaphylaxis, Cough and Other (See Comments)    Other reaction(s): anaphylaxis Pt states it made him "cough his head off"   . Lorazepam     Other reaction(s): Hallucinations     Dimple Nanas, PharmD PGY-1 Acute Care Pharmacy Resident Office: [phone] 02/28/2020 7:22 AM

## 2020-02-28 NOTE — Progress Notes (Signed)
PROGRESS NOTE    Howard Allen  QPR:[id number] DOB: 1945-09-14 DOA: 02/13/2020 PCP: System, Provider Not In   Brief Narrative: The patient is a 74 year old Caucasian male with a past medical history significant for Crohn's disease, hypertension, hyperlipidemia, depression history of hospitalization in June 2021 for pneumatosis status post ileocolonic resection and lysis of adhesions who presented with multiple tonic-clonic seizures complicated by status epilepticus that required intubation and loading with Keppra. CT of the head was negative and initially when he was brought in to the ED by EMS there was at least 3 witnessed generalized tonic-clonic seizures noted. PCCM was consulted for admission given that he was intubated on arrival and unresponsive. He was intubated on 02/13/2020 and extubated 02/15/2020. Neurology was consulted and he underwent further work-up and was found to have a signal abnormality in the left limb is most consistent with an acute infarct. Echocardiogram done showed an EF of 55 to 60% with moderate LVH and grade 1 diastolic dysfunction. EEG testing on 10/9 was negative. 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 He underwent several viral serologies and had a lumbar puncture sent on 02/14/2020. He was started on IV vancomycin, IV ceftriaxone, IV ampicillin and IV acyclovir for suspected meningitis. CSF analysis was consistent with aseptic meningitis with high white blood cells and very low glucose level. CSF culture was initally negative. He was transferred to [facility] service on 02/19/2020 and remained confused. Subsequently ID was consulted and they were suspecting aseptic meningoencephalitis and recommended large volume LP. Patient was already on aspirin and Plavix for past 5 days. His Plavix was stopped on 02/20/2020. P2 Y 12 was checked and based on that, neurology performed another large-volume LP on 02/22/2020 and several labs were sent by ID. 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   Assessment & Plan:   Principal Problem:   Aseptic meningitis Active Problems:   Seizure ([place])   Weight loss   Encounter for nasogastric (NG) tube placement   Aseptic meningitis  - LP performed on 10/7 consistent with meningitis - LP Cultures positive for candida as of 02/27/20 - already on amphotericin B - ID following - appreciate insight and recommendations - VDLR and cryptococcal antigen negative., and West nile serology pending - Quantiferon gold was indeterminate.  Patient was on vancomycin, ceftriaxone and acyclovir.  Consulted ID on 02/20/2020.  Dr. Tommy Medal recommended repeat large-volume LP to send more labs. Rocephin and vancomycin discontinued but acyclovir continues.  Had a long multidisciplinary discussion between me, Dr. Tommy Medal and Dr. Hortense Ramal of neurology on 02/21/2020.  - Patient finally underwent large-volume LP by neurology on 02/22/2020. 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   - HCV PCR once again negative.   - Plavix on hold as ID plans to do third LP - will wait until remainder of labs result per ID before moving forward with repeat LP.  Large hematoma above right eye:  - He developed large hematoma on the morning of 02/22/2020 after he is continued to pluck his right forehead laceration.  - Initially he was unable to open his eye but there was no pressure on the eyeball. For this reason, I had consulted ENT and he was evaluated by them but they did not feel the need to evacuate hematoma.  - Patient's  hematoma is decreasing in size and he is able to open about 90% of his right eye.  No more bleeding.  Acute Ischemic Infarct  - Seen on MRI imaging on 10/7: left thalamic signal abnormality measuring 2.3 cm most consistent with acute  infarct; no edema or hemorrhagic conversion. (neuro suspects 2/2 prolonged seizure) - Will need outpt MRI in 8-12 weeks to help delineate this -outpt neuro f/u as well - CTA head and neck without large vessel occlusion or proximal stenosis, small ICA aneurysm 2 mm, right upper pulmonary nodule 5 mm, recommend outpatient follow-up imaging per protocol. - Now on atorvastatin 80 mg despite that he takes 20 at home given his acute CVA - TTE negative for vegetations or thrombosis.  - Continue high intensity statin; Lipid panel done and showed a total cholesterol/HDL ratio 3.3, cholesterol level 154, HDL level 46, LDL 79, triglycerides 146, VLDL 29 - PT/OT recommending CIR - appreciate neurology evaluation/recommendations.  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  Hypomagnesemia - Resolved.  Hyperphosphatemia - Follow PTH, CPK levels - Previously low in the setting of malnutrition/refeeding syndrome - Likely compounded by AKI as below  B12 Deficiency  Moderate Malnutrition - Likely due to malnutrition and anorexia, possibly worsened by malabsorption given bowel inflammation seen on imaging the past several months prior to resection. Of note, 70 lb weight loss in the past 10 months.  - Consult to dietician for tube feed initiation, he was started on tube feedings which was discontinued now that he is tolerating dysphagia 2 diet. Nutritionist now recommending supplemental shake twice daily as well as Magic cup twice daily with meals and multivitamin with minerals daily.  HTN/HLD -Home medications include Toprol 100 mg, Amlodipine 5 mg, Lipitor 20 mg, Fenofibrate 160 mg.  Blood pressure very well controlled.  Will continue amlodipine to 10 mg and Toprol-XL 100 mg p.o. daily.   Normocytic Anemia  Hx of Iron  Deficiency Anemia B12 Deficiency  - B12 levels low. Iron panel shows low saturation, however normal iron and TIBC, unclear picture as not consistent completely with iron deficiency anemia or anemia of chronic disease.  Hemoglobin is stable.  Questionable Crohn's Disease s/p Ileocolic Resection  - No longer on immunosuppressive therapy. -Takes budesonide as an outpatient -currently on hold  AKI on CKD stage II Slight jump in creatinine.  Likely due to dehydration.  Will start on gentle hydration normal saline 75 cc/h for 24 hours.  Repeat labs in the morning.  DVT prophylaxis: SCDs Heparin 5000 units subcu every 8 hours Code Status: FULL CODE  Family Communication: Daughter Threasa Beards updated Disposition Plan: Plan to discharge to CIR once cleared by ID and neurology.  Status is: Inpatient  Remains inpatient appropriate because:Altered mental status, Unsafe d/c plan, IV treatments appropriate due to intensity of illness or inability to take PO and Inpatient level of care appropriate due to severity of illness  Dispo: The patient is from: Home              Anticipated d/c is to: CIR              Anticipated d/c date is: >4 days              Patient currently is not medically stable to d/c.  Consultants:   Neurology  Infectious disease  PCCM Transfer  Procedures:  10/6 >> ETT placed 10/7 >> LP 10/8 >> Extubated   Significant Diagnostic Tests:  10/6 CT Head >> No acute intracranial abnormalities; age-related volume loss and chronic small vessel disease 10/7 MRI brain: 1. Signal abnormality in the left thalamus most consistent with an acute infarct. 2. Signal abnormality in the mesial left temporal lobe and splenium of the corpus callosum which may reflect acute infarct, sequelae of recent seizure activity, or encephalitis (including herpes encephalitis). No contralateral cerebral involvement. 3. Mild chronic small vessel ischemic disease. 10/8 echo: LVEF 55-60%, mod LVH,  Grade I diastolic dysfunction 32/9 CT Head >> Progressive low density in the left thalamus related to acute infarction; no hemorrhage 10/9 EEG >> Negative  Micro Data:  10/6 COVID-19 + influenza >> Negative  10/6 Blood Cultures >> NGTD 10/7 CSF Culture >> NGTD 10/7 HSV serology >> 2.02 on igg testing. Will send pcr to determine past or active.  10/7 West Nile serology >>  10/7 VRDL serology >> 10/8 Quanitferon Gold  >> indeterminant 10/9 acid fast csf: Pending 10/11 HSV PCR: pending  10/15 -CSF culture -Candida albicans  Antimicrobials:  Anti-infectives (From admission, onward)   Start     Dose/Rate Route Frequency Ordered Stop   02/26/20 1115  rifampin (RIFADIN) 600 mg in sodium chloride 0.9 % 100 mL IVPB        600 mg 200 mL/hr over 30 Minutes Intravenous Every 24 hours 02/26/20 1026     02/23/20 0100  acyclovir (ZOVIRAX) 615 mg in dextrose 5 % 100 mL IVPB  Status:  Discontinued        10 mg/kg  61.3 kg 112.3 mL/hr over 60 Minutes Intravenous Every 12 hours 02/22/20 1537 02/24/20 0936   02/22/20 2100  amphotericin B liposome (AMBISOME) 310 mg in dextrose 5 % 500 mL IVPB        5 mg/kg  61.3 kg 288.8 mL/hr over 120 Minutes Intravenous Every 24 hours 02/22/20 1524     02/22/20 1800  rifampin (RIFADIN) capsule 600 mg  Status:  Discontinued        600 mg Oral Daily 02/22/20 1520 02/26/20 1026   02/22/20 1800  isoniazid (NYDRAZID) tablet 300 mg        300 mg Oral Daily 02/22/20 1520     02/22/20 1800  pyrazinamide tablet 1,500 mg        1,500 mg Oral Daily 02/22/20 1520     02/22/20 1800  ethambutol (MYAMBUTOL) tablet 1,200 mg        1,200 mg Oral Daily 02/22/20 1520     02/20/20 1000  vancomycin (VANCOCIN) IVPB 1000 mg/200 mL premix  Status:  Discontinued        1,000 mg 200 mL/hr over 60 Minutes Intravenous Every 24 hours 02/20/20 0555 02/21/20 1259   02/19/20 1811  vancomycin variable dose per unstable renal function (pharmacist dosing)  Status:  Discontinued         Does  not apply See admin instructions 02/19/20 1811 02/20/20 0555   02/18/20 0500  vancomycin (VANCOREADY) IVPB 750 mg/150 mL  Status:  Discontinued        750 mg 150 mL/hr over 60 Minutes Intravenous Every 12 hours 02/17/20 1651 02/19/20 1811   02/15/20 0300  vancomycin (VANCOREADY) IVPB 500 mg/100 mL        500 mg 100 mL/hr over 60 Minutes Intravenous Every 12 hours 02/14/20 1352 02/17/20 1755   02/14/20 1400  acyclovir (ZOVIRAX) 690 mg in dextrose 5 % 100  mL IVPB  Status:  Discontinued        10 mg/kg  68.9 kg 113.8 mL/hr over 60 Minutes Intravenous Every 12 hours 02/14/20 1245 02/22/20 1537   02/14/20 1400  vancomycin (VANCOREADY) IVPB 1250 mg/250 mL        1,250 mg 166.7 mL/hr over 90 Minutes Intravenous STAT 02/14/20 1352 02/14/20 1655   02/14/20 1300  cefTRIAXone (ROCEPHIN) 2 g in sodium chloride 0.9 % 100 mL IVPB  Status:  Discontinued        2 g 200 mL/hr over 30 Minutes Intravenous Every 12 hours 02/14/20 1245 02/20/20 1732   02/14/20 1300  ampicillin (OMNIPEN) 2 g in sodium chloride 0.9 % 100 mL IVPB  Status:  Discontinued        2 g 300 mL/hr over 20 Minutes Intravenous Every 6 hours 02/14/20 1245 02/18/20 1051       Subjective: No acute issues or events overnight, patient awake alert oriented to person and general situation, correctly recalls his home city state, family members but has difficulty orienting to place and time.  Answers most questions with "I do not know" otherwise.  Review of systems somewhat limited.  Objective: Vitals:   02/27/20 2007 02/27/20 2343 02/28/20 0409 02/28/20 0426  BP: 135/81 (!) 159/80 (!) 144/76   Pulse: 65 60 (!) 49   Resp: 17 17 17    Temp: 97.6 F (36.4 C) (!) 97.5 F (36.4 C) 97.6 F (36.4 C)   TempSrc: Axillary Axillary Oral   SpO2: 99% 99% 100%   Weight:    63 kg  Height:        Intake/Output Summary (Last 24 hours) at 02/28/2020 0710 Last data filed at 02/28/2020 0600 Gross per 24 hour  Intake 24104.81 ml  Output 450 ml  Net  23654.81 ml   Filed Weights   02/24/20 0415 02/27/20 0001 02/28/20 0426  Weight: 66.7 kg 63.3 kg 63 kg   Examination:  General:  Pleasantly resting in bed, No acute distress.  Alert to person only HEENT: Large ecchymosis and hematoma over right lateral eye.  Sclerae nonicteric, noninjected.  Extraocular movements intact bilaterally. Neck:  Without mass or deformity.  Trachea is midline. Lungs:  Clear to auscultate bilaterally without rhonchi, wheeze, or rales. Heart:  Regular rate and rhythm.  Without murmurs, rubs, or gallops. Abdomen:  Soft, nontender, nondistended.  Without guarding or rebound. Extremities: Without cyanosis, clubbing, edema, or obvious deformity. Vascular:  Dorsalis pedis and posterior tibial pulses palpable bilaterally. Skin:  Warm and dry, no erythema, no ulcerations.   Data Reviewed: I have personally reviewed following labs and imaging studies  CBC: Recent Labs  Lab 02/21/20 0814  WBC 7.4  NEUTROABS 5.5  HGB 10.4*  HCT 32.6*  MCV 88.6  PLT 287   Basic Metabolic Panel: Recent Labs  Lab 02/24/20 0256 02/25/20 0216 02/26/20 0240 02/26/20 0803 02/27/20 0501 02/28/20 0528  NA 132* 132* 130*  --  132* 130*  K 4.3 4.1 3.9  --  4.9 4.0  CL 104 106 100  --  105 102  CO2 20* 17* 19*  --  16* 18*  GLUCOSE 83 100* 88  --  99 134*  BUN 23 25* 30*  --  33* 33*  CREATININE 1.21 1.23 1.35*  --  1.30* 1.36*  CALCIUM 8.8* 9.1 9.3  --  9.4 9.3  MG 1.8 1.7  --  2.4 2.0 1.8  PHOS 3.2 3.7 5.8*  --  6.0* 6.6*   GFR:  Estimated Creatinine Clearance: 43.8 mL/min (A) (by C-G formula based on SCr of 1.36 mg/dL (H)). Liver Function Tests: Recent Labs  Lab 02/23/20 0341  AST 22  ALT 18  ALKPHOS 36*  BILITOT 0.5  PROT 5.9*  ALBUMIN 3.2*   No results for input(s): LIPASE, AMYLASE in the last 168 hours. No results for input(s): AMMONIA in the last 168 hours. Coagulation Profile: No results for input(s): INR, PROTIME in the last 168 hours. Cardiac  Enzymes: No results for input(s): CKTOTAL, CKMB, CKMBINDEX, TROPONINI in the last 168 hours. BNP (last 3 results) No results for input(s): PROBNP in the last 8760 hours. HbA1C: No results for input(s): HGBA1C in the last 72 hours. CBG: Recent Labs  Lab 02/27/20 1613 02/27/20 2010 02/27/20 2342 02/28/20 0407 02/28/20 0453  GLUCAP 120* 113* 108* 50* 76   Lipid Profile: No results for input(s): CHOL, HDL, LDLCALC, TRIG, CHOLHDL, LDLDIRECT in the last 72 hours. Thyroid Function Tests: No results for input(s): TSH, T4TOTAL, FREET4, T3FREE, THYROIDAB in the last 72 hours. Anemia Panel: No results for input(s): VITAMINB12, FOLATE, FERRITIN, TIBC, IRON, RETICCTPCT in the last 72 hours. Sepsis Labs: No results for input(s): PROCALCITON, LATICACIDVEN in the last 168 hours.  Recent Results (from the past 240 hour(s))  Culture, fungus without smear     Status: Abnormal (Preliminary result)   Collection Time: 02/22/20 11:38 AM   Specimen: CSF; Other  Result Value Ref Range Status   Specimen Description CSF TUBE 2  Final   Special Requests Immunocompromised  Final   Culture (A)  Final    [facility] CRITICAL RESULT CALLED TO, READ BACK BY AND VERIFIED WITH: DR Drucilla Schmidt @0808  02/27/20 EB Performed at [facility], [address], [place], [place] [address]    Report Status PENDING  Incomplete  Acid Fast Smear (AFB)     Status: None   Collection Time: 02/22/20 11:38 AM   Specimen: CSF; Cerebrospinal Fluid  Result Value Ref Range Status   AFB Specimen Processing Concentration  Final   Acid Fast Smear Negative  Final    Comment: (NOTE) Performed At: [facility] [place], [place] [id number] Rush Farmer MD QH:[id number]    Source (AFB) CSF  Final    Comment: TUBE 2 Performed at [facility], [address] [place], [place] [address]   MTB RIF NAA Non-Sputum, w/o Culture     Status: None   Collection Time: 02/22/20 11:38 AM  Result Value Ref  Range Status   Source CSF  Final    Comment: Performed at [facility], [place] [address], [place], [place] [address]   Specimen Source Comment  Final    Comment: Cerebrospinal fluid (CSF)   M tuberculosis complex Comment NOT DETECTED Final    Comment: (NOTE) Mycobacterium tuberculosis complex (MTBC) NOT detected. This test was developed and its performance characteristics determined by [facility]. It has not been cleared or approved by the Food and Drug Administration. Per the College of American Pathologists (CAP) guidelines, a culture must be performed on all samples regardless of the molecular test result. By ordering this test code, the client has assumed responsibility for the performance of the culture.    Rifampin Comment Susceptible Final    Comment: (NOTE) Because Mycobacterium tuberculosis complex (MTBC) was not detected, no rifampin determination is possible. This test was developed and its performance characteristics determined by [facility]. It has not been cleared or approved by the Food and Drug Administration.  AFB Specimen Processing Concentration  Final    Comment: (NOTE) Performed At: [facility] [place], [place] [id number] Rush Farmer MD DJ:[id number]      RN Pressure Injury Documentation:     Estimated body mass index is 19.93 kg/m as calculated from the following:   Height as of this encounter: 5\' 10"  (1.778 m).   Weight as of this encounter: 63 kg.  Malnutrition Type:  Nutrition Problem: Moderate Malnutrition   Malnutrition Characteristics:  Signs/Symptoms: mild fat depletion, moderate fat depletion, mild muscle depletion, moderate muscle depletion   Nutrition Interventions:  Interventions: Hormel Shake, Magic cup, MVI   Radiology Studies: MR BRAIN W WO CONTRAST  Result Date: 02/26/2020 CLINICAL DATA:  74 year old male with seizures, meningitis. Abnormal signal in the left thalamus, mesial left temporal lobe  and splenium earlier this month. EXAM: MRI HEAD WITHOUT AND WITH CONTRAST TECHNIQUE: Multiplanar, multiecho pulse sequences of the brain and surrounding structures were obtained without and with intravenous contrast. CONTRAST:  10mL GADAVIST GADOBUTROL 1 MMOL/ML IV SOLN COMPARISON:  CTA head and neck 02/18/2011. Brain MRI 02/14/2020. Head CT 02/16/2020 and earlier. FINDINGS: Brain: Partially faded globular abnormal DWI signal in the left thalamus which was clearly restricted on 02/14/2020. Continued subset area of restriction on ADC today. Mild associated T2 and FLAIR hyperintensity. No hemorrhage or mass effect. Largely resolved abnormal diffusion signal in the left hippocampal complex (perhaps trace residual on series 4, image 19) with subtle residual T2 hyperintensity there. Punctate signal abnormality on trace DWI at the midline splenium is unchanged, with no convincing associated T2 or FLAIR hyperintensity. Following contrast, there is confluent enhancement in the left thalamus corresponding to the shape of restricted diffusion on the prior study (series 11, image 30). No enhancement of the left hippocampus. No definite enhancement in the splenium, and no other abnormal parenchymal enhancement identified. No Pachymeningeal thickening or enhancement identified. But there is suggestion of abnormal enhancement in the left internal auditory canal now (series 11, image 17 and series 13, image 16), which seems new from earlier this month and questionably associated with a dark T2 filling defect at the porous acusticus (series 6, image 9). No other leptomeningeal enhancement identified. No midline shift, mass effect, ventriculomegaly, extra-axial collection or acute intracranial hemorrhage. Cervicomedullary junction and pituitary are within normal limits. Background gray and white matter signal is largely normal for age, with mild nonspecific periventricular T2 and FLAIR hyperintensity. No cortical encephalomalacia. No  chronic cerebral blood products identified. Vascular: Major intracranial vascular flow voids are stable. The major dural venous sinuses are enhancing and appear to be patent. Skull and upper cervical spine: Negative visible cervical spine, spinal cord. Visualized bone marrow signal is within normal limits. Sinuses/Orbits: Stable, negative. Other: Decreasing bilateral mastoid effusions, trace now. No residual fluid in the visible pharynx. There is a new right periorbital scalp hematoma from the prior MRI (series 6, image 14). IMPRESSION: 1. 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 2. Resolved abnormal diffusion in the left hippocampal formation with mild residual T2 hyperintensity. Punctate abnormal diffusion in the splenium of the corpus callosum is unchanged. 3. Questionable new leptomeningeal enhancement in the left internal auditory canal, but no other leptomeningeal or pachymeningeal abnormality is identified. 4. No other new intracranial abnormality. Electronically Signed   By: Genevie Ann M.D.   On: 02/26/2020 19:34   Scheduled Meds: . amLODipine  10 mg Oral Daily  . aspirin  81 mg Oral Daily  .  atorvastatin  80 mg Oral Daily  . Chlorhexidine Gluconate Cloth  6 each Topical Daily  . dexamethasone (DECADRON) injection  18 mg Intravenous Daily  . dextrose  10 mL Intravenous Q24H  . dextrose  10 mL Intravenous Q24H  . docusate  100 mg Oral BID  . ethambutol  1,200 mg Oral Daily  . fenofibrate  160 mg Oral Daily  . heparin  5,000 Units Subcutaneous Q8H  . insulin aspart  0-9 Units Subcutaneous Q4H  . isoniazid  300 mg Oral Daily  . levETIRAcetam  1,000 mg Oral Q12H  . metoprolol succinate  50 mg Oral Daily  . multivitamin with minerals  1 tablet Oral Daily  . pantoprazole sodium  40 mg Oral Daily  . polyethylene glycol  17 g Oral Daily  . potassium chloride  40 mEq Oral Daily  . pyrazinamide  1,500 mg Oral Daily  . vitamin B-6  50 mg Oral  Daily  . sodium chloride  500 mL Intravenous Q24H  . vitamin B-12  1,000 mcg Oral Daily   Continuous Infusions: . sodium chloride Stopped (02/24/20 0116)  . sodium chloride Stopped (02/24/20 0218)  . amphotericin  B  Liposome (AMBISOME) ADULT IV Stopped (02/27/20 2354)  . rifampin (RIFADIN) IVPB Stopped (02/27/20 1900)  . sodium chloride Stopped (02/28/20 0115)    LOS: 15 days   Little Ishikawa, DO [facility] Pager: secure chat on epic  If 7PM-7AM, please contact night-coverage [email or web address]

## 2020-02-28 NOTE — Progress Notes (Signed)
Physical Therapy Treatment Patient Details Name: Howard Allen MRN: [id number] DOB: 11-08-1945 Today's Date: 02/28/2020    History of Present Illness Pt is a 74 year old male who was having multiple seizures and has a history of falls. 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 NIH 4 on 02/15/20 and then 14 on 02/17/20. Medical hx consisting of Chron's disease and HTN.    PT Comments    Pt up in chair on arrival and agreeable to participate with therapy. He remains confused and disorientated, repeatedly asking "what town is this". Upon standing pt soiled with urine and BM requiring assist for peri care prior to transfer to [facility] to complete voiding bowls. Pt with large loose BM. Pt performed sit<>stand 10x for neuromuscular re-ed. Min A required in standing due to forward lean. Patient would benefit from continued skilled PT to maximize functional independence and decrease fall risk. Will continue to follow acutely.     Follow Up Recommendations  Supervision/Assistance - 24 hour;SNF     Equipment Recommendations  Rolling walker with 5" wheels;3in1 (PT);Wheelchair (measurements PT);Wheelchair cushion (measurements PT);Hospital bed    Recommendations for Other Services       Precautions / Restrictions Precautions Precautions: Fall;Other (comment) Precaution Comments: seizures, condom cath    Mobility  Bed Mobility               General bed mobility comments: In chair on arrival  Transfers Overall transfer level: Needs assistance Equipment used: Rolling walker (2 wheeled);None Transfers: Sit to/from  [facility] Sit to Stand: Min assist;Mod assist Stand pivot transfers: Min assist       General transfer comment: initially min A to stand. Progressed to mod A as pt fatigued.   Ambulation/Gait                 Stairs             Wheelchair Mobility    Modified Rankin (Stroke Patients Only) Modified Rankin (Stroke Patients Only) Pre-Morbid Rankin Score: No symptoms Modified Rankin: Moderately severe disability     Balance Overall balance assessment: History of Falls;Needs assistance Sitting-balance support: Single extremity supported;Feet supported Sitting balance-Leahy Scale: Fair Sitting balance - Comments: min guard for safety but able to maintain midline Postural control: Other (comment) (forward lean in standing) Standing balance support: No upper extremity supported Standing balance-Leahy Scale: Poor Standing balance comment: able to stand statically w/o support, Cannot tolerate challange.                            Cognition Arousal/Alertness: Awake/alert Behavior During Therapy: Flat affect Overall Cognitive Status: Impaired/Different from baseline Area of Impairment: Attention;Memory;Following commands;Safety/judgement;Awareness;Problem solving;Orientation                 Orientation Level: Disoriented to;Place;Time;Situation Current Attention Level: Sustained Memory: Decreased recall of precautions;Decreased short-term memory Following Commands: Follows one step commands with increased time Safety/Judgement: Decreased awareness of safety;Decreased awareness of deficits Awareness: Intellectual Problem Solving: Slow processing General Comments: Asked several times throughout session "what town is this". Able to state that is it October, but cannot  give the year or current president.       Exercises General Exercises - Lower Extremity Long Arc Quad: AROM;Both;10 reps;Seated Heel Slides: AROM;Both;10  reps;Seated Hip Flexion/Marching: AROM;Both;10 reps;Seated Other Exercises Other Exercises: sit<>stand 10x for neuromuscular re-ed    General Comments General comments (skin integrity, edema, etc.): Pt external cathater had fallen off and pt urinated on gown. Also with bowl movement on chair. Pt aware of need for additional BM and assisted to [facility]. Pt able to stand with min A  while recieving peri care.      Pertinent Vitals/Pain Pain Assessment: Faces Faces Pain Scale: Hurts a little bit Pain Location: With urination Pain Descriptors / Indicators: Grimacing;Discomfort Pain Intervention(s): Monitored during session;Limited activity within patient's tolerance    Home Living                      Prior Function            PT Goals (current goals can now be found in the care plan section) Acute Rehab PT Goals Patient Stated Goal: to go home PT Goal Formulation: With patient Time For Goal Achievement: 03/04/20 Potential to Achieve Goals: Good Progress towards PT goals: Progressing toward goals    Frequency    Min 3X/week      PT Plan Current plan remains appropriate    Co-evaluation              AM-PAC PT "6 Clicks" Mobility   Outcome Measure  Help needed turning from your back to your side while in a flat bed without using bedrails?: A Little Help needed moving from lying on your back to sitting on the side of a flat bed without using bedrails?: A Little Help needed moving to and from a bed to a chair (including a wheelchair)?: A Little Help needed standing up from a chair using your arms (e.g., wheelchair or bedside chair)?: A Little Help needed to walk in hospital room?: A Lot Help needed climbing 3-5 steps with a railing? : Total 6 Click Score: 15    End of Session Equipment Utilized During Treatment: Gait belt Activity Tolerance: Patient tolerated treatment well Patient left: in chair;with call bell/phone within reach;with chair alarm set Nurse  Communication: Mobility status;Other (comment) (external cath off) PT Visit Diagnosis: Unsteadiness on feet (R26.81);Other abnormalities of gait and mobility (R26.89);Muscle weakness (generalized) (M62.81);History of falling (Z91.81);Difficulty in walking, not elsewhere classified (R26.2);Other symptoms and signs involving the nervous system (R29.898)     Time: 1610-9604 PT Time Calculation (min) (ACUTE ONLY): 31 min  Charges:  $Therapeutic Activity: 8-22 mins $Neuromuscular Re-education: 8-22 mins                     Benjiman Core, [place] Pager [phone] Acute Rehab  Allena Katz 02/28/2020, 11:45 AM

## 2020-02-28 NOTE — Progress Notes (Signed)
Subjective:  Patient without complaints  Antibiotics:  Anti-infectives (From admission, onward)   Start     Dose/Rate Route Frequency Ordered Stop   02/26/20 1115  rifampin (RIFADIN) 600 mg in sodium chloride 0.9 % 100 mL IVPB        600 mg 200 mL/hr over 30 Minutes Intravenous Every 24 hours 02/26/20 1026     02/23/20 0100  acyclovir (ZOVIRAX) 615 mg in dextrose 5 % 100 mL IVPB  Status:  Discontinued        10 mg/kg  61.3 kg 112.3 mL/hr over 60 Minutes Intravenous Every 12 hours 02/22/20 1537 02/24/20 0936   02/22/20 2100  amphotericin B liposome (AMBISOME) 310 mg in dextrose 5 % 500 mL IVPB        5 mg/kg  61.3 kg 288.8 mL/hr over 120 Minutes Intravenous Every 24 hours 02/22/20 1524     02/22/20 1800  rifampin (RIFADIN) capsule 600 mg  Status:  Discontinued        600 mg Oral Daily 02/22/20 1520 02/26/20 1026   02/22/20 1800  isoniazid (NYDRAZID) tablet 300 mg        300 mg Oral Daily 02/22/20 1520     02/22/20 1800  pyrazinamide tablet 1,500 mg        1,500 mg Oral Daily 02/22/20 1520     02/22/20 1800  ethambutol (MYAMBUTOL) tablet 1,200 mg        1,200 mg Oral Daily 02/22/20 1520     02/20/20 1000  vancomycin (VANCOCIN) IVPB 1000 mg/200 mL premix  Status:  Discontinued        1,000 mg 200 mL/hr over 60 Minutes Intravenous Every 24 hours 02/20/20 0555 02/21/20 1259   02/19/20 1811  vancomycin variable dose per unstable renal function (pharmacist dosing)  Status:  Discontinued         Does not apply See admin instructions 02/19/20 1811 02/20/20 0555   02/18/20 0500  vancomycin (VANCOREADY) IVPB 750 mg/150 mL  Status:  Discontinued        750 mg 150 mL/hr over 60 Minutes Intravenous Every 12 hours 02/17/20 1651 02/19/20 1811   02/15/20 0300  vancomycin (VANCOREADY) IVPB 500 mg/100 mL        500 mg 100 mL/hr over 60 Minutes Intravenous Every 12 hours 02/14/20 1352 02/17/20 1755   02/14/20 1400  acyclovir (ZOVIRAX) 690 mg in dextrose 5 % 100 mL IVPB  Status:   Discontinued        10 mg/kg  68.9 kg 113.8 mL/hr over 60 Minutes Intravenous Every 12 hours 02/14/20 1245 02/22/20 1537   02/14/20 1400  vancomycin (VANCOREADY) IVPB 1250 mg/250 mL        1,250 mg 166.7 mL/hr over 90 Minutes Intravenous STAT 02/14/20 1352 02/14/20 1655   02/14/20 1300  cefTRIAXone (ROCEPHIN) 2 g in sodium chloride 0.9 % 100 mL IVPB  Status:  Discontinued        2 g 200 mL/hr over 30 Minutes Intravenous Every 12 hours 02/14/20 1245 02/20/20 1732   02/14/20 1300  ampicillin (OMNIPEN) 2 g in sodium chloride 0.9 % 100 mL IVPB  Status:  Discontinued        2 g 300 mL/hr over 20 Minutes Intravenous Every 6 hours 02/14/20 1245 02/18/20 1051      Medications: Scheduled Meds: . amLODipine  10 mg Oral Daily  . aspirin  81 mg Oral Daily  . atorvastatin  80 mg Oral Daily  .  Chlorhexidine Gluconate Cloth  6 each Topical Daily  . dexamethasone (DECADRON) injection  18 mg Intravenous Daily  . dextrose  10 mL Intravenous Q24H  . dextrose  10 mL Intravenous Q24H  . docusate  100 mg Oral BID  . ethambutol  1,200 mg Oral Daily  . fenofibrate  160 mg Oral Daily  . heparin  5,000 Units Subcutaneous Q8H  . insulin aspart  0-9 Units Subcutaneous Q4H  . isoniazid  300 mg Oral Daily  . levETIRAcetam  1,000 mg Oral Q12H  . metoprolol succinate  50 mg Oral Daily  . multivitamin with minerals  1 tablet Oral Daily  . pantoprazole sodium  40 mg Oral Daily  . polyethylene glycol  17 g Oral Daily  . potassium chloride  40 mEq Oral Daily  . pyrazinamide  1,500 mg Oral Daily  . vitamin B-6  50 mg Oral Daily  . sodium chloride  500 mL Intravenous Q24H  . vitamin B-12  1,000 mcg Oral Daily   Continuous Infusions: . sodium chloride Stopped (02/24/20 0116)  . sodium chloride Stopped (02/24/20 0218)  . amphotericin  B  Liposome (AMBISOME) ADULT IV Stopped (02/27/20 2354)  . rifampin (RIFADIN) IVPB 600 mg (02/28/20 1023)  . sodium chloride Stopped (02/28/20 0115)   PRN Meds:.sodium  chloride, acetaminophen, albuterol, diphenhydrAMINE **OR** diphenhydrAMINE, docusate, Gerhardt's butt cream, labetalol, meperidine (DEMEROL) injection, Resource ThickenUp Clear    Objective: Weight change: -0.3 kg  Intake/Output Summary (Last 24 hours) at 02/28/2020 1626 Last data filed at 02/28/2020 1300 Gross per 24 hour  Intake 23864.81 ml  Output 500 ml  Net 23364.81 ml   Blood pressure (!) 130/99, pulse 72, temperature (!) 97.1 F (36.2 C), temperature source Axillary, resp. rate 20, height 5\' 10"  (1.778 m), weight 63 kg, SpO2 99 %. Temp:  [97.1 F (36.2 C)-97.6 F (36.4 C)] 97.1 F (36.2 C) (10/21 1535) Pulse Rate:  [49-72] 72 (10/21 1535) Resp:  [17-20] 20 (10/21 1535) BP: (130-159)/(75-99) 130/99 (10/21 1535) SpO2:  [95 %-100 %] 99 % (10/21 1535) Weight:  [16 kg] 63 kg (10/21 0426)  Physical Exam: General: Awake and interactive.  Thinks he is in [place] a hospital [facility] regular rate, normal  Chest: , no wheezing, no respiratory distress Abdomen: soft non-distended,  Extremities: no edema or deformity noted bilaterally Skin: no rashes Neuro: nonfocal  CBC:    BMET Recent Labs    02/27/20 0501 02/28/20 0528  NA 132* 130*  K 4.9 4.0  CL 105 102  CO2 16* 18*  GLUCOSE 99 134*  BUN 33* 33*  CREATININE 1.30* 1.36*  CALCIUM 9.4 9.3     Liver Panel  No results for input(s): PROT, ALBUMIN, AST, ALT, ALKPHOS, BILITOT, BILIDIR, IBILI in the last 72 hours.     Sedimentation Rate No results for input(s): ESRSEDRATE in the last 72 hours. C-Reactive Protein No results for input(s): CRP in the last 72 hours.  Micro Results: Recent Results (from the past 720 hour(s))  Respiratory Panel by RT PCR (Flu A&B, Covid) - Nasopharyngeal Swab     Status: None   Collection Time: 02/13/20  4:17 PM   Specimen: Nasopharyngeal Swab  Result Value Ref Range Status   SARS Coronavirus 2 by RT PCR NEGATIVE NEGATIVE Final    Comment: (NOTE) SARS-CoV-2 target nucleic  acids are NOT DETECTED.  The SARS-CoV-2 RNA is generally detectable in upper respiratoy specimens during the acute phase of infection. The lowest concentration of SARS-CoV-2 viral copies this assay can  detect is 131 copies/mL. A negative result does not preclude SARS-Cov-2 infection and should not be used as the sole basis for treatment or other patient management decisions. A negative result may occur with  improper specimen collection/handling, submission of specimen other than nasopharyngeal swab, presence of viral mutation(s) within the areas targeted by this assay, and inadequate number of viral copies (<131 copies/mL). A negative result must be combined with clinical observations, patient history, and epidemiological information. The expected result is Negative.  Fact Sheet for Patients:  PinkCheek.be  Fact Sheet for Healthcare Providers:  GravelBags.it  This test is no t yet approved or cleared by the [place] FDA and  has been authorized for detection and/or diagnosis of SARS-CoV-2 by FDA under an Emergency Use Authorization (EUA). This EUA will remain  in effect (meaning this test can be used) for the duration of the COVID-19 declaration under Section 564(b)(1) of the Act, 21 U.S.C. section 360bbb-3(b)(1), unless the authorization is terminated or revoked sooner.     Influenza A by PCR NEGATIVE NEGATIVE Final   Influenza B by PCR NEGATIVE NEGATIVE Final    Comment: (NOTE) The Xpert Xpress SARS-CoV-2/FLU/RSV assay is intended as an aid in  the diagnosis of influenza from Nasopharyngeal swab specimens and  should not be used as a sole basis for treatment. Nasal washings and  aspirates are unacceptable for Xpert Xpress SARS-CoV-2/FLU/RSV  testing.  Fact Sheet for Patients: PinkCheek.be  Fact Sheet for Healthcare Providers: GravelBags.it  This test  is not yet approved or cleared by the [place] FDA and  has been authorized for detection and/or diagnosis of SARS-CoV-2 by  FDA under an Emergency Use Authorization (EUA). This EUA will remain  in effect (meaning this test can be used) for the duration of the  Covid-19 declaration under Section 564(b)(1) of the Act, 21  U.S.C. section 360bbb-3(b)(1), unless the authorization is  terminated or revoked. Performed at [facility], [place] [address], [place], [place] [address]   Culture, blood (Routine X 2) w Reflex to ID Panel     Status: None   Collection Time: 02/13/20  4:57 PM   Specimen: BLOOD LEFT FOREARM  Result Value Ref Range Status   Specimen Description BLOOD LEFT FOREARM  Final   Special Requests   Final    BOTTLES DRAWN AEROBIC AND ANAEROBIC Blood Culture results may not be optimal due to an excessive volume of blood received in culture bottles   Culture   Final    NO GROWTH 5 DAYS Performed at [facility], [place] [address], [place], [place] [address]    Report Status 02/18/2020 FINAL  Final  Culture, blood (Routine X 2) w Reflex to ID Panel     Status: None   Collection Time: 02/13/20  5:02 PM   Specimen: BLOOD LEFT WRIST  Result Value Ref Range Status   Specimen Description BLOOD LEFT WRIST  Final   Special Requests   Final    BOTTLES DRAWN AEROBIC AND ANAEROBIC Blood Culture results may not be optimal due to an excessive volume of blood received in culture bottles   Culture   Final    NO GROWTH 5 DAYS Performed at [facility], [place] [address] [place], [place] [address]    Report Status 02/18/2020 FINAL  Final  MRSA PCR Screening     Status: Abnormal   Collection Time: 02/14/20  3:03 AM   Specimen: Nasal Mucosa; Nasopharyngeal  Result Value Ref Range Status  MRSA by PCR POSITIVE (A) NEGATIVE Final    Comment:        The GeneXpert MRSA Assay (FDA approved for NASAL specimens only), is one component of a comprehensive MRSA  colonization surveillance program. It is not intended to diagnose MRSA infection nor to guide or monitor treatment for MRSA infections. RESULT CALLED TO, READ BACK BY AND VERIFIED WITH: Lillie Fragmin RN 8:25 02/14/20 ([email or web address]) Performed at [facility], [place] [address], [place], [place] [address]   CSF culture     Status: None   Collection Time: 02/14/20 11:01 AM   Specimen: CSF; Cerebrospinal Fluid  Result Value Ref Range Status   Specimen Description CSF  Final   Special Requests NONE  Final   Gram Stain   Final    WBC PRESENT,BOTH PMN AND MONONUCLEAR NO ORGANISMS SEEN CYTOSPIN SMEAR    Culture   Final    NO GROWTH 3 DAYS Performed at [facility], [place] [address] [place], [place] [address]    Report Status 02/17/2020 FINAL  Final  Acid Fast Smear (AFB)     Status: None   Collection Time: 02/17/20  1:11 PM   Specimen: PATH Cytology CSF  Result Value Ref Range Status   AFB Specimen Processing Concentration  Final   Acid Fast Smear Negative  Final    Comment: (NOTE) Performed At: [facility] [place], [place] [id number] Rush Farmer MD JK:[id number]    Source (AFB) CSF  Final    Comment: Performed at [facility], [place] [address] [place], [place] [address]  Culture, fungus without smear     Status: None (Preliminary result)   Collection Time: 02/17/20  1:14 PM   Specimen: CSF; Cerebrospinal Fluid  Result Value Ref Range Status   Specimen Description CSF  Final   Special Requests NONE  Final   Culture   Final    NO FUNGUS ISOLATED AFTER 9 DAYS Performed at [facility], [place] [address] [place], [place] [address]    Report Status PENDING  Incomplete  Culture, fungus without smear     Status: Abnormal (Preliminary result)   Collection Time: 02/22/20 11:38 AM   Specimen: CSF; Other  Result Value Ref Range Status   Specimen Description CSF TUBE 2  Final   Special Requests Immunocompromised  Final   Culture (A)  Final     [facility] CRITICAL RESULT CALLED TO, READ BACK BY AND VERIFIED WITH: DR Drucilla Schmidt @0808  02/27/20 EB Sent to [place] for further susceptibility testing. Performed at [facility], [place] [address] [place], [place] [address]    Report Status PENDING  Incomplete  Acid Fast Smear (AFB)     Status: None   Collection Time: 02/22/20 11:38 AM   Specimen: CSF; Cerebrospinal Fluid  Result Value Ref Range Status   AFB Specimen Processing Concentration  Final   Acid Fast Smear Negative  Final    Comment: (NOTE) Performed At: [facility] [place], [place] [id number] Rush Farmer MD OE:[id number]    Source (AFB) CSF  Final    Comment: TUBE 2 Performed at [facility], [address], [place], [place] [address]   MTB RIF NAA Non-Sputum, w/o Culture     Status: None   Collection Time: 02/22/20 11:38 AM  Result Value Ref Range Status   Source CSF  Final    Comment: Performed at [facility], [place] [address]  [place], [place] [address]   Specimen Source Comment  Final    Comment: Cerebrospinal fluid (CSF)   M tuberculosis complex Comment NOT DETECTED Final    Comment: (NOTE) Mycobacterium tuberculosis complex (MTBC) NOT detected. This test was developed and its performance characteristics determined by [facility]. It has not been cleared or approved by the Food and Drug Administration. Per the College of American Pathologists (CAP) guidelines, a culture must be performed on all samples regardless of the molecular test result. By ordering this test code, the client has assumed responsibility for the performance of the culture.    Rifampin Comment Susceptible Final    Comment: (NOTE) Because Mycobacterium tuberculosis complex (MTBC) was not detected, no rifampin determination is possible. This test was developed and its performance characteristics determined by [facility]. It has not been cleared or approved by the Food and Drug Administration.    AFB  Specimen Processing Concentration  Final    Comment: (NOTE) Performed At: [facility] [place], [place] [id number] Rush Farmer MD XB:[id number]     Studies/Results: MR BRAIN W WO CONTRAST  Result Date: 02/26/2020 CLINICAL DATA:  74 year old male with seizures, meningitis. Abnormal signal in the left thalamus, mesial left temporal lobe and splenium earlier this month. EXAM: MRI HEAD WITHOUT AND WITH CONTRAST TECHNIQUE: Multiplanar, multiecho pulse sequences of the brain and surrounding structures were obtained without and with intravenous contrast. CONTRAST:  87mL GADAVIST GADOBUTROL 1 MMOL/ML IV SOLN COMPARISON:  CTA head and neck 02/18/2011. Brain MRI 02/14/2020. Head CT 02/16/2020 and earlier. FINDINGS: Brain: Partially faded globular abnormal DWI signal in the left thalamus which was clearly restricted on 02/14/2020. Continued subset area of restriction on ADC today. Mild associated T2 and FLAIR hyperintensity. No hemorrhage or mass effect. Largely resolved abnormal diffusion signal in the left hippocampal complex (perhaps trace residual on series 4, image 19) with subtle residual T2 hyperintensity there. Punctate signal abnormality on trace DWI at the midline splenium is unchanged, with no convincing associated T2 or FLAIR hyperintensity. Following contrast, there is confluent enhancement in the left thalamus corresponding to the shape of restricted diffusion on the prior study (series 11, image 30). No enhancement of the left hippocampus. No definite enhancement in the splenium, and no other abnormal parenchymal enhancement identified. No Pachymeningeal thickening or enhancement identified. But there is suggestion of abnormal enhancement in the left internal auditory canal now (series 11, image 17 and series 13, image 16), which seems new from earlier this month and questionably associated with a dark T2 filling defect at the porous acusticus (series 6, image 9). No other  leptomeningeal enhancement identified. No midline shift, mass effect, ventriculomegaly, extra-axial collection or acute intracranial hemorrhage. Cervicomedullary junction and pituitary are within normal limits. Background gray and white matter signal is largely normal for age, with mild nonspecific periventricular T2 and FLAIR hyperintensity. No cortical encephalomalacia. No chronic cerebral blood products identified. Vascular: Major intracranial vascular flow voids are stable. The major dural venous sinuses are enhancing and appear to be patent. Skull and upper cervical spine: Negative visible cervical spine, spinal cord. Visualized bone marrow signal is within normal limits. Sinuses/Orbits: Stable, negative. Other: Decreasing bilateral mastoid effusions, trace now. No residual fluid in the visible pharynx. There is a new right periorbital scalp hematoma from the prior MRI (series 6, image 14). IMPRESSION: 1. 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 2. Resolved abnormal diffusion in the left hippocampal formation with mild residual T2 hyperintensity. Punctate abnormal diffusion  in the splenium of the corpus callosum is unchanged. 3. Questionable new leptomeningeal enhancement in the left internal auditory canal, but no other leptomeningeal or pachymeningeal abnormality is identified. 4. No other new intracranial abnormality. Electronically Signed   By: Genevie Ann M.D.   On: 02/26/2020 19:34   DG Swallowing Func-Speech Pathology  Result Date: 02/28/2020 Objective Swallowing Evaluation: Type of Study: MBS-Modified Barium Swallow Study  Patient Details Name: Howard Allen MRN: [id number] Date of Birth: August 21, 1945 Today's Date: 02/28/2020 Time: SLP Start Time (ACUTE ONLY): 0825 -SLP Stop Time (ACUTE ONLY): 0845 SLP Time Calculation (min) (ACUTE ONLY): 20 min Past Medical History: Past Medical History: Diagnosis Date . Aseptic meningitis 02/21/2020 . Weight  loss 02/21/2020 Past Surgical History:  The histories are not reviewed yet. Please review them in the "History" navigator section and refresh this [place]. HPI: 74 y.o. 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   Subjective: alert and following commands well Assessment / Plan / Recommendation CHL IP CLINICAL IMPRESSIONS 02/28/2020 Clinical Impression Pt demonstrates improvements since initial MBS, including improved mentation for command following to attempt strategies as well as improved oral control and timing. He has no oral loss this time, and pt self-manages oral residue with solids by using multiple swallows. Lingual pumping is noted that mildly reduces efficiency, but is still functional. Thin liquids still enter the airway before the swallow given the above combined with reduced coordination for airway closure. Aspiration is in small amounts, as is penetration to the vocal folds, but it is still silent. A cued throat clear helps to clear the airway. A chin tuck improves airway protection by better containing thin liquids within the valleculae before swallowing, but he has a little trouble with maintaining a full chin tuck throughout the duration of the swallow. When he does not stay in a full chin tuck, penetration/aspiration still occur. Recommend advancing diet to Dys 3 solids and nectar thick liquids with good potential to advance to thin liquids as he can more consistently use a chin tuck.  SLP Visit Diagnosis Dysphagia, oropharyngeal phase (R13.12) Attention and concentration deficit following -- Frontal lobe and executive function deficit following -- Impact on safety and function  Mild aspiration risk;Moderate aspiration risk   CHL IP TREATMENT RECOMMENDATION 02/28/2020 Treatment Recommendations Therapy as outlined in treatment plan below   Prognosis 02/28/2020 Prognosis for Safe Diet Advancement Good Barriers to Reach Goals Cognitive deficits Barriers/Prognosis Comment -- CHL IP DIET RECOMMENDATION 02/28/2020 SLP Diet Recommendations Dysphagia 3 (Mech soft) solids;Nectar thick liquid Liquid Administration via Cup;Straw Medication Administration Crushed with puree Compensations Minimize environmental distractions;Slow rate;Small sips/bites Postural Changes Seated upright at 90 degrees   CHL IP OTHER RECOMMENDATIONS 02/28/2020 Recommended Consults -- Oral Care Recommendations Oral care BID Other Recommendations --   CHL IP FOLLOW UP RECOMMENDATIONS 02/28/2020 Follow up Recommendations Skilled Nursing facility   [facility] IP FREQUENCY AND DURATION 02/28/2020 Speech Therapy Frequency (ACUTE ONLY) min 2x/week Treatment Duration 2 weeks      CHL IP ORAL PHASE 02/28/2020 Oral Phase Impaired Oral - Pudding Teaspoon -- Oral - Pudding Cup -- Oral - Honey Teaspoon -- Oral - Honey Cup NT Oral - Nectar Teaspoon -- Oral - Nectar Cup Lingual pumping;Delayed oral transit  Oral - Nectar Straw Lingual pumping;Delayed oral transit Oral - Thin Teaspoon -- Oral - Thin Cup Premature spillage Oral - Thin Straw Premature spillage Oral - Puree Lingual pumping;Delayed oral transit Oral - Mech Soft Lingual pumping;Delayed oral transit;Lingual/palatal residue Oral - Regular -- Oral - Multi-Consistency -- Oral - Pill Lingual pumping;Delayed oral transit;Lingual/palatal residue;Other (Comment) Oral Phase - Comment --  CHL IP PHARYNGEAL PHASE 02/28/2020 Pharyngeal Phase Impaired Pharyngeal- Pudding Teaspoon -- Pharyngeal -- Pharyngeal- Pudding Cup -- Pharyngeal -- Pharyngeal- Honey Teaspoon -- Pharyngeal -- Pharyngeal- Honey Cup NT Pharyngeal -- Pharyngeal- Nectar Teaspoon -- Pharyngeal -- Pharyngeal- Nectar Cup Reduced anterior  laryngeal mobility;Reduced tongue base retraction Pharyngeal Material does not enter airway Pharyngeal- Nectar Straw Reduced anterior laryngeal mobility;Reduced tongue base retraction Pharyngeal Material does not enter airway Pharyngeal- Thin Teaspoon -- Pharyngeal -- Pharyngeal- Thin Cup Reduced anterior laryngeal mobility;Reduced tongue base retraction;Penetration/Aspiration before swallow Pharyngeal Material enters airway, passes BELOW cords without attempt by patient to eject out (silent aspiration) Pharyngeal- Thin Straw Reduced anterior laryngeal mobility;Reduced tongue base retraction;Penetration/Aspiration before swallow Pharyngeal Material enters airway, passes BELOW cords without attempt by patient to eject out (silent aspiration) Pharyngeal- Puree Reduced anterior laryngeal mobility;Reduced tongue base retraction;Pharyngeal residue - valleculae Pharyngeal -- Pharyngeal- Mechanical Soft Reduced anterior laryngeal mobility;Reduced tongue base retraction;Pharyngeal residue - valleculae Pharyngeal -- Pharyngeal- Regular -- Pharyngeal -- Pharyngeal- Multi-consistency -- Pharyngeal -- Pharyngeal- Pill -- Pharyngeal -- Pharyngeal Comment --  CHL IP CERVICAL ESOPHAGEAL PHASE 02/28/2020 Cervical Esophageal Phase WFL Pudding Teaspoon -- Pudding Cup -- Honey Teaspoon -- Honey Cup -- Nectar Teaspoon -- Nectar Cup -- Nectar Straw -- Thin Teaspoon -- Thin Cup -- Thin Straw -- Puree -- Mechanical Soft -- Regular -- Multi-consistency -- Pill -- Cervical Esophageal Comment -- Osie Bond., M.A. [facility] Pager [phone] Office [phone] 02/28/2020, 9:38 AM                 Assessment/Plan:  INTERVAL HISTORY:   MRI completed Principal Problem:   Aseptic meningitis Active Problems:   Seizure ([place])   Weight loss   Encounter for nasogastric (NG) tube placement    Howard Allen is a 74 y.o. 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  HSV 1 and 2 PCR's were negative x2 CSF cultures originally taken were without growth.  VZV PCR negative  Blastomyces antigen in urine is negative CSF for MTB PCR negative X2 LP's  He has multiple studies pending including:  AFB culture [place] (AFB stains were negative x2 Fungal culture--note I have asked him to incubate fungal cultures for 6 weeks    CSF for Coccidioides antibiotic bodies, CSF Blastomyces antigen CSF histoplasma antigen, MTB PCR CSF for paraneoplastic panel per Neurology  (Epic states some of these completed but I do not think they are  Sensitivity of MTB PCR for TB meningitis is roughly 50% in some studies and he has 2 negative MTB PCR   For now I would favor nonetheless continuing treatment for possible TB and fungal meningitis  Once we have fungal ag, serologies back from CSF we may either have a diagnosis OR w negative assays reason to then promptly proceed to look again for carcinomatous meningitis  I esp want him on current therapy so that WHEN he has repeat LP we can see how CSF looks on such therapy and avoid ascribing it looking unchanged to taking away an anti  microbial       LOS: 15 days   Alcide Evener 02/28/2020, 4:26 PM

## 2020-02-28 NOTE — Progress Notes (Signed)
Hypoglycemic Event  CBG: 50  Treatment: 2 tubes glucose gel  Symptoms: None  Follow-up CBG: Time:0456 CBG Result:76  Possible Reasons for Event: unknown, inadequate intake, medication regime  Comments: pt asymptomatic, treatment effective. Will continue to closely monitor.  Tyhir Schwan, RN

## 2020-02-28 NOTE — Progress Notes (Signed)
Occupational Therapy Treatment Patient Details Name: Howard Allen MRN: [id number] DOB: 01/23/1946 Today's Date: 02/28/2020    History of present illness Pt is a 74 year old male who was having multiple seizures and has a history of falls. 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 NIH 4 on 02/15/20 and then 14 on 02/17/20. Medical hx consisting of Chron's disease and HTN.   OT comments  Pt progressing towards acute OT goals. Bed mobility, sat EOB a few minutes then pivotal steps to recliner. Remains with impaired cognition including decreased awareness, generalized weakness, and decreased activity tolerance. D/cplan remains appropriate.    Follow Up Recommendations  SNF    Equipment Recommendations  Other (comment) (defer to next venue)    Recommendations for Other Services      Precautions / Restrictions Precautions Precautions: Fall;Other (comment) Precaution Comments: seizures, condom cath Restrictions Weight Bearing Restrictions: No       Mobility Bed Mobility Overal bed mobility: Needs Assistance Bed Mobility: Supine to Sit     Supine to sit: Min assist     General bed mobility comments: HOB elevated a bit. steadying assist.  Transfers Overall transfer level: Needs assistance Equipment used: Rolling walker (2 wheeled) Transfers: Sit to/from [facility] Sit to Stand: Min assist;Mod assist Stand pivot transfers: Min assist       General transfer comment: increased assist with fatigue.     Balance Overall balance assessment: History of Falls;Needs assistance Sitting-balance  support: Single extremity supported;Feet supported Sitting balance-Leahy Scale: Fair Sitting balance - Comments: BUE support in static sitting Postural control: Other (comment) (forward lean in standing) Standing balance support: Bilateral upper extremity supported Standing balance-Leahy Scale: Poor Standing balance comment: rw for support                           ADL either performed or assessed with clinical judgement   ADL Overall ADL's : Needs assistance/impaired                         Toilet Transfer: Minimal assistance Toilet Transfer Details (indicate cue type and reason): pivotal steps Toileting- Clothing Manipulation and Hygiene: Moderate assistance;Sit to/from stand;Cueing for safety;Cueing for sequencing         General ADL Comments: Pt completed bed mobility, pivotal steps from EOB to recliner. steadying assist.      Vision       Perception     Praxis      Cognition Arousal/Alertness: Awake/alert Behavior During Therapy: Flat affect Overall Cognitive Status: Impaired/Different from baseline Area of Impairment: Attention;Memory;Following commands;Safety/judgement;Awareness;Problem solving;Orientation                 Orientation Level: Disoriented to;Place;Time;Situation Current Attention Level: Sustained Memory: Decreased recall of precautions;Decreased short-term memory Following Commands: Follows one step commands with increased time Safety/Judgement: Decreased awareness of safety;Decreased awareness of deficits Awareness: Intellectual Problem Solving: Slow processing General Comments: Asked several times throughout session "what town is this". Able to state that is it October, but cannot give the year or current president.  Exercises General Exercises - Lower Extremity Long Arc Quad: AROM;Both;10 reps;Seated Heel Slides: AROM;Both;10 reps;Seated Hip Flexion/Marching: AROM;Both;10 reps;Seated Other Exercises Other  Exercises: sit<>stand 10x for neuromuscular re-ed   Shoulder Instructions       General Comments Pt with decreased awareness of wet bed pad and soiled gown (condom cath disconnected?)    Pertinent Vitals/ Pain       Pain Assessment: Faces Faces Pain Scale: No hurt Pain Location: With urination Pain Descriptors / Indicators: Grimacing;Discomfort Pain Intervention(s): Monitored during session;Limited activity within patient's tolerance  Home Living                                          Prior Functioning/Environment              Frequency  Min 2X/week        Progress Toward Goals  OT Goals(current goals can now be found in the care plan section)  Progress towards OT goals: Progressing toward goals  Acute Rehab OT Goals Patient Stated Goal: to go home OT Goal Formulation: With patient/family Time For Goal Achievement: 03/04/20 Potential to Achieve Goals: Good ADL Goals Pt Will Perform Grooming: with set-up;sitting Pt Will Perform Upper Body Dressing: with min assist;sitting Pt Will Perform Lower Body Dressing: with min assist;sit to/from stand Pt Will Transfer to Toilet: with min assist;ambulating Pt Will Perform Toileting - Clothing Manipulation and hygiene: with min assist;sit to/from stand  Plan Discharge plan remains appropriate    Co-evaluation                 AM-PAC OT "6 Clicks" Daily Activity     Outcome Measure   Help from another person eating meals?: A Lot Help from another person taking care of personal grooming?: A Lot Help from another person toileting, which includes using toliet, bedpan, or urinal?: A Lot Help from another person bathing (including washing, rinsing, drying)?: A Lot Help from another person to put on and taking off regular upper body clothing?: A Little Help from another person to put on and taking off regular lower body clothing?: A Lot 6 Click Score: 13    End of Session Equipment Utilized During  Treatment: Gait belt;Rolling walker  OT Visit Diagnosis: Unsteadiness on feet (R26.81);History of falling (Z91.81);Muscle weakness (generalized) (M62.81);Other symptoms and signs involving the nervous system (R29.898);Other symptoms and signs involving cognitive function;Pain   Activity Tolerance Patient limited by fatigue;Patient tolerated treatment well   Patient Left in chair;with call bell/phone within reach;with chair alarm set   Nurse Communication Other (comment) (up in recliner, wet gown & bed pads, condom cath unpluggd?)        Time: 1010-1030 OT Time Calculation (min): 20 min  Charges: OT General Charges $OT Visit: 1 Visit OT Treatments $Self Care/Home Management : 8-22 mins  [name], OT Acute Rehabilitation Services Pager: [phone] Office: [phone]    [name] 02/28/2020, 1:39 PM

## 2020-02-28 NOTE — Progress Notes (Signed)
Modified Barium Swallow Progress Note  Patient Details  Name: Howard Allen MRN: [id number] Date of Birth: Mar 15, 1946  Today's Date: 02/28/2020  Modified Barium Swallow completed.  Full report located under Chart Review in the Imaging Section.  Brief recommendations include the following:  Clinical Impression   Pt demonstrates improvements since initial MBS, including improved mentation for command following to attempt strategies as well as improved oral control and timing. He has no oral loss this time, and pt self-manages oral residue with solids by using multiple swallows. Lingual pumping is noted that mildly reduces efficiency, but is still functional. Thin liquids still enter the airway before the swallow given the above combined with reduced coordination for airway closure. Aspiration is in small amounts, as is penetration to the vocal folds, but it is still silent. A cued throat clear helps to clear the airway. A chin tuck improves airway protection by better containing thin liquids within the valleculae before swallowing, but he has a little trouble with maintaining a full chin tuck throughout the duration of the swallow. When he does not stay in a full chin tuck, penetration/aspiration still occur. Recommend advancing diet to Dys 3 solids and nectar thick liquids with good potential to advance to thin liquids as he can more consistently use a chin tuck.   Swallow Evaluation Recommendations       SLP Diet Recommendations: Dysphagia 3 (Mech soft) solids;Nectar thick liquid   Liquid Administration via: Cup;Straw   Medication Administration: Crushed with puree   Supervision: Patient able to self feed;Full supervision/cueing for compensatory strategies   Compensations: Minimize environmental distractions;Slow rate;Small sips/bites   Postural Changes: Seated upright at 90 degrees   Oral Care Recommendations: Oral care BID        Osie Bond., M.A. [place] Acute Rehabilitation  Services Pager [phone] Office [phone]  02/28/2020,9:23 AM

## 2020-02-29 DIAGNOSIS — G40901 Epilepsy, unspecified, not intractable, with status epilepticus: Secondary | ICD-10-CM | POA: Diagnosis not present

## 2020-02-29 DIAGNOSIS — R634 Abnormal weight loss: Secondary | ICD-10-CM | POA: Diagnosis not present

## 2020-02-29 DIAGNOSIS — R569 Unspecified convulsions: Secondary | ICD-10-CM | POA: Diagnosis not present

## 2020-02-29 DIAGNOSIS — E440 Moderate protein-calorie malnutrition: Secondary | ICD-10-CM | POA: Insufficient documentation

## 2020-02-29 DIAGNOSIS — G030 Nonpyogenic meningitis: Secondary | ICD-10-CM | POA: Diagnosis not present

## 2020-02-29 LAB — GLUCOSE, CAPILLARY
Glucose-Capillary: 138 mg/dL — ABNORMAL HIGH (ref 70–99)
Glucose-Capillary: 90 mg/dL (ref 70–99)
Glucose-Capillary: 54 mg/dL — ABNORMAL LOW (ref 70–99)
Glucose-Capillary: 63 mg/dL — ABNORMAL LOW (ref 70–99)
Glucose-Capillary: 87 mg/dL (ref 70–99)
Glucose-Capillary: 77 mg/dL (ref 70–99)
Glucose-Capillary: 120 mg/dL — ABNORMAL HIGH (ref 70–99)
Glucose-Capillary: 126 mg/dL — ABNORMAL HIGH (ref 70–99)

## 2020-02-29 LAB — COMPREHENSIVE METABOLIC PANEL
ALT: 33 U/L (ref 0–44)
AST: 62 U/L — ABNORMAL HIGH (ref 15–41)
Albumin: 3.3 g/dL — ABNORMAL LOW (ref 3.5–5.0)
Alkaline Phosphatase: 46 U/L (ref 38–126)
Anion gap: 12 (ref 5–15)
BUN: 32 mg/dL — ABNORMAL HIGH (ref 8–23)
CO2: 20 mmol/L — ABNORMAL LOW (ref 22–32)
Calcium: 9.5 mg/dL (ref 8.9–10.3)
Chloride: 99 mmol/L (ref 98–111)
Creatinine, Ser: 1.42 mg/dL — ABNORMAL HIGH (ref 0.61–1.24)
GFR, Estimated: 53 mL/min — ABNORMAL LOW (ref >60)
Glucose, Bld: 97 mg/dL (ref 70–99)
Potassium: 4.4 mmol/L (ref 3.5–5.1)
Sodium: 131 mmol/L — ABNORMAL LOW (ref 135–145)
Total Bilirubin: 0.7 mg/dL (ref 0.3–1.2)
Total Protein: 5.9 g/dL — ABNORMAL LOW (ref 6.5–8.1)

## 2020-02-29 LAB — PTH, INTACT AND CALCIUM
Calcium, Total (PTH): 9.8 mg/dL (ref 8.6–10.2)
PTH: 14 pg/mL — ABNORMAL LOW (ref 15–65)

## 2020-02-29 LAB — MAGNESIUM: Magnesium: 2 mg/dL (ref 1.7–2.4)

## 2020-02-29 MED ORDER — SODIUM CHLORIDE 0.9 % IV BOLUS
750.0000 mL | INTRAVENOUS | Status: DC
  Administered 2020-02-29 – 2020-03-03 (×4): 750 mL via INTRAVENOUS

## 2020-02-29 MED ORDER — SODIUM CHLORIDE 0.9 % IV BOLUS FOR AMBISOME
750.0000 mL | INTRAVENOUS | Status: DC
  Administered 2020-02-29 – 2020-03-02 (×2): 750 mL via INTRAVENOUS

## 2020-02-29 NOTE — Progress Notes (Signed)
Pharmacy Antibiotic Note  Saliou Barnier is a 74 y.o. 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 Thus far, no organism has been identified as a cause of infection.   ID plan for LP in the future- Plavix discontinued.  CSF culture from 10/15 now with Candida albicans.  CSF studies for Coccidioides, Blastomyces, Histoplasma, and paraneoplastic panel pending.    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 Current SCr is increased at 1.42 (BL ~1.2-1.4). Will increase pre and post hydration of amphotericin B to 750 mL NS. Continue to watch renal function and further adjust NS if Scr continues to increase.   Potassium remains stable (4.4 today) and patient is currently on a daily potassium supplement. Mg today is 2.  AST/ALT are wnl - will continue to monitor while on RIPE therapy.   Plan: Amphotericin B 310 mg (~ 5 mg/kg) every 24 hours  Give 750 mL of normal saline 1 hour prior to dose Flush line with dextrose in-between normal saline and amphotericin B doses Give another 750 mL normal saline after amphotericin B infusion is complete  Administration instructions reviewed with RN Daily K supplement Supplement Mg as needed F/u daily K and Mg levels   Rifampin 600 mg daily Isoniazid 300 mg daily Pyridoxine 50 mg daily Pyrazinamide 1500 mg daily Ethambutol 1200 mg daily Dexamethasone IV 0.3 mg/kg (~18 mg) daily for 2 weeks- Will taper as appropriate pending results of CSF    Height: 5\' 10"  (177.8 cm) Weight: 63.2 kg (139 lb 5.3 oz) IBW/kg (Calculated) : 73  Temp (24hrs), Avg:98 F (36.7 C), Min:97.1 F (36.2 C), Max:98.6 F (37 C)  Recent Labs  Lab 02/25/20 0216 02/26/20 0240 02/27/20 0501 02/28/20 0528 02/29/20 0824  CREATININE 1.23 1.35* 1.30* 1.36* 1.42*    Estimated Creatinine Clearance: 42 mL/min (A) (by C-G formula based on SCr of 1.42 mg/dL (H)).    Allergies  Allergen Reactions  .  Lisinopril Anaphylaxis, Cough and Other (See Comments)    Other reaction(s): anaphylaxis Pt states it made him "cough his head off"   . Lorazepam     Other reaction(s): Hallucinations     Dimple Nanas, PharmD PGY-1 Acute Care Pharmacy Resident Office: [phone] 02/29/2020 10:25 AM

## 2020-02-29 NOTE — Progress Notes (Signed)
Subjective:  Patient without complaints  Antibiotics:  Anti-infectives (From admission, onward)   Start     Dose/Rate Route Frequency Ordered Stop   02/26/20 1115  rifampin (RIFADIN) 600 mg in sodium chloride 0.9 % 100 mL IVPB        600 mg 200 mL/hr over 30 Minutes Intravenous Every 24 hours 02/26/20 1026     02/23/20 0100  acyclovir (ZOVIRAX) 615 mg in dextrose 5 % 100 mL IVPB  Status:  Discontinued        10 mg/kg  61.3 kg 112.3 mL/hr over 60 Minutes Intravenous Every 12 hours 02/22/20 1537 02/24/20 0936   02/22/20 2100  amphotericin B liposome (AMBISOME) 310 mg in dextrose 5 % 500 mL IVPB        5 mg/kg  61.3 kg 288.8 mL/hr over 120 Minutes Intravenous Every 24 hours 02/22/20 1524     02/22/20 1800  rifampin (RIFADIN) capsule 600 mg  Status:  Discontinued        600 mg Oral Daily 02/22/20 1520 02/26/20 1026   02/22/20 1800  isoniazid (NYDRAZID) tablet 300 mg        300 mg Oral Daily 02/22/20 1520     02/22/20 1800  pyrazinamide tablet 1,500 mg        1,500 mg Oral Daily 02/22/20 1520     02/22/20 1800  ethambutol (MYAMBUTOL) tablet 1,200 mg        1,200 mg Oral Daily 02/22/20 1520     02/20/20 1000  vancomycin (VANCOCIN) IVPB 1000 mg/200 mL premix  Status:  Discontinued        1,000 mg 200 mL/hr over 60 Minutes Intravenous Every 24 hours 02/20/20 0555 02/21/20 1259   02/19/20 1811  vancomycin variable dose per unstable renal function (pharmacist dosing)  Status:  Discontinued         Does not apply See admin instructions 02/19/20 1811 02/20/20 0555   02/18/20 0500  vancomycin (VANCOREADY) IVPB 750 mg/150 mL  Status:  Discontinued        750 mg 150 mL/hr over 60 Minutes Intravenous Every 12 hours 02/17/20 1651 02/19/20 1811   02/15/20 0300  vancomycin (VANCOREADY) IVPB 500 mg/100 mL        500 mg 100 mL/hr over 60 Minutes Intravenous Every 12 hours 02/14/20 1352 02/17/20 1755   02/14/20 1400  acyclovir (ZOVIRAX) 690 mg in dextrose 5 % 100 mL IVPB  Status:   Discontinued        10 mg/kg  68.9 kg 113.8 mL/hr over 60 Minutes Intravenous Every 12 hours 02/14/20 1245 02/22/20 1537   02/14/20 1400  vancomycin (VANCOREADY) IVPB 1250 mg/250 mL        1,250 mg 166.7 mL/hr over 90 Minutes Intravenous STAT 02/14/20 1352 02/14/20 1655   02/14/20 1300  cefTRIAXone (ROCEPHIN) 2 g in sodium chloride 0.9 % 100 mL IVPB  Status:  Discontinued        2 g 200 mL/hr over 30 Minutes Intravenous Every 12 hours 02/14/20 1245 02/20/20 1732   02/14/20 1300  ampicillin (OMNIPEN) 2 g in sodium chloride 0.9 % 100 mL IVPB  Status:  Discontinued        2 g 300 mL/hr over 20 Minutes Intravenous Every 6 hours 02/14/20 1245 02/18/20 1051      Medications: Scheduled Meds: . amLODipine  10 mg Oral Daily  . aspirin  81 mg Oral Daily  . atorvastatin  80 mg Oral Daily  .  Chlorhexidine Gluconate Cloth  6 each Topical Daily  . dexamethasone (DECADRON) injection  18 mg Intravenous Daily  . dextrose  10 mL Intravenous Q24H  . dextrose  10 mL Intravenous Q24H  . docusate  100 mg Oral BID  . ethambutol  1,200 mg Oral Daily  . fenofibrate  160 mg Oral Daily  . heparin  5,000 Units Subcutaneous Q8H  . insulin aspart  0-9 Units Subcutaneous Q4H  . isoniazid  300 mg Oral Daily  . levETIRAcetam  1,000 mg Oral Q12H  . metoprolol succinate  50 mg Oral Daily  . multivitamin with minerals  1 tablet Oral Daily  . pantoprazole sodium  40 mg Oral Daily  . polyethylene glycol  17 g Oral Daily  . potassium chloride  40 mEq Oral Daily  . pyrazinamide  1,500 mg Oral Daily  . vitamin B-6  50 mg Oral Daily  . sodium chloride  750 mL Intravenous Q24H  . vitamin B-12  1,000 mcg Oral Daily   Continuous Infusions: . sodium chloride Stopped (02/24/20 0116)  . sodium chloride Stopped (02/24/20 0218)  . amphotericin  B  Liposome (AMBISOME) ADULT IV Stopped (02/29/20 0046)  . rifampin (RIFADIN) IVPB 600 mg (02/29/20 1204)  . sodium chloride     PRN Meds:.sodium chloride, acetaminophen,  albuterol, diphenhydrAMINE **OR** diphenhydrAMINE, docusate, Gerhardt's butt cream, labetalol, meperidine (DEMEROL) injection, Resource ThickenUp Clear    Objective: Weight change: 0.2 kg  Intake/Output Summary (Last 24 hours) at 02/29/2020 1211 Last data filed at 02/29/2020 0616 Gross per 24 hour  Intake 2134.32 ml  Output 1700 ml  Net 434.32 ml   Blood pressure 114/66, pulse 61, temperature (!) 97.5 F (36.4 C), temperature source Oral, resp. rate 18, height 5\' 10"  (1.778 m), weight 63.2 kg, SpO2 100 %. Temp:  [97.1 F (36.2 C)-98.6 F (37 C)] 97.5 F (36.4 C) (10/22 1208) Pulse Rate:  [52-72] 61 (10/22 1208) Resp:  [17-20] 18 (10/22 0751) BP: (114-137)/(66-99) 114/66 (10/22 1208) SpO2:  [95 %-100 %] 100 % (10/22 1208) Weight:  [63.2 kg] 63.2 kg (10/22 0500)  Physical Exam: General: Awake and interactive.knows he is in [place] in a hospital [facility] regular rate, normal  Chest: , no wheezing, no respiratory distress Abdomen: soft non-distended,  Extremities: no edema or deformity noted bilaterally Skin: no rashes Neuro: nonfocal  CBC:    BMET Recent Labs    02/28/20 0528 02/28/20 1413 02/29/20 0824  NA 130*  --  131*  K 4.0  --  4.4  CL 102  --  99  CO2 18*  --  20*  GLUCOSE 134*  --  97  BUN 33*  --  32*  CREATININE 1.36*  --  1.42*  CALCIUM 9.3 9.8 9.5     Liver Panel  Recent Labs    02/29/20 0824  PROT 5.9*  ALBUMIN 3.3*  AST 62*  ALT 33  ALKPHOS 46  BILITOT 0.7       Sedimentation Rate No results for input(s): ESRSEDRATE in the last 72 hours. C-Reactive Protein No results for input(s): CRP in the last 72 hours.  Micro Results: Recent Results (from the past 720 hour(s))  Respiratory Panel by RT PCR (Flu A&B, Covid) - Nasopharyngeal Swab     Status: None   Collection Time: 02/13/20  4:17 PM   Specimen: Nasopharyngeal Swab  Result Value Ref Range Status   SARS Coronavirus 2 by RT PCR NEGATIVE NEGATIVE Final    Comment: (NOTE) SARS-CoV-2  target nucleic  acids are NOT DETECTED.  The SARS-CoV-2 RNA is generally detectable in upper respiratoy specimens during the acute phase of infection. The lowest concentration of SARS-CoV-2 viral copies this assay can detect is 131 copies/mL. A negative result does not preclude SARS-Cov-2 infection and should not be used as the sole basis for treatment or other patient management decisions. A negative result may occur with  improper specimen collection/handling, submission of specimen other than nasopharyngeal swab, presence of viral mutation(s) within the areas targeted by this assay, and inadequate number of viral copies (<131 copies/mL). A negative result must be combined with clinical observations, patient history, and epidemiological information. The expected result is Negative.  Fact Sheet for Patients:  PinkCheek.be  Fact Sheet for Healthcare Providers:  GravelBags.it  This test is no t yet approved or cleared by the [place] FDA and  has been authorized for detection and/or diagnosis of SARS-CoV-2 by FDA under an Emergency Use Authorization (EUA). This EUA will remain  in effect (meaning this test can be used) for the duration of the COVID-19 declaration under Section 564(b)(1) of the Act, 21 U.S.C. section 360bbb-3(b)(1), unless the authorization is terminated or revoked sooner.     Influenza A by PCR NEGATIVE NEGATIVE Final   Influenza B by PCR NEGATIVE NEGATIVE Final    Comment: (NOTE) The Xpert Xpress SARS-CoV-2/FLU/RSV assay is intended as an aid in  the diagnosis of influenza from Nasopharyngeal swab specimens and  should not be used as a sole basis for treatment. Nasal washings and  aspirates are unacceptable for Xpert Xpress SARS-CoV-2/FLU/RSV  testing.  Fact Sheet for Patients: PinkCheek.be  Fact Sheet for Healthcare  Providers: GravelBags.it  This test is not yet approved or cleared by the [place] FDA and  has been authorized for detection and/or diagnosis of SARS-CoV-2 by  FDA under an Emergency Use Authorization (EUA). This EUA will remain  in effect (meaning this test can be used) for the duration of the  Covid-19 declaration under Section 564(b)(1) of the Act, 21  U.S.C. section 360bbb-3(b)(1), unless the authorization is  terminated or revoked. Performed at [facility], [place] [address], [place], [place] [address]   Culture, blood (Routine X 2) w Reflex to ID Panel     Status: None   Collection Time: 02/13/20  4:57 PM   Specimen: BLOOD LEFT FOREARM  Result Value Ref Range Status   Specimen Description BLOOD LEFT FOREARM  Final   Special Requests   Final    BOTTLES DRAWN AEROBIC AND ANAEROBIC Blood Culture results may not be optimal due to an excessive volume of blood received in culture bottles   Culture   Final    NO GROWTH 5 DAYS Performed at [facility], [place] [address] [place], [place] [address]    Report Status 02/18/2020 FINAL  Final  Culture, blood (Routine X 2) w Reflex to ID Panel     Status: None   Collection Time: 02/13/20  5:02 PM   Specimen: BLOOD LEFT WRIST  Result Value Ref Range Status   Specimen Description BLOOD LEFT WRIST  Final   Special Requests   Final    BOTTLES DRAWN AEROBIC AND ANAEROBIC Blood Culture results may not be optimal due to an excessive volume of blood received in culture bottles   Culture   Final    NO GROWTH 5 DAYS Performed at [facility], [place] [address], [place], [place] [address]    Report Status 02/18/2020 FINAL  Final  MRSA PCR Screening     Status: Abnormal   Collection Time: 02/14/20  3:03 AM   Specimen: Nasal Mucosa; Nasopharyngeal  Result Value Ref Range Status   MRSA by PCR POSITIVE (A) NEGATIVE Final    Comment:        The GeneXpert MRSA Assay (FDA approved for NASAL  specimens only), is one component of a comprehensive MRSA colonization surveillance program. It is not intended to diagnose MRSA infection nor to guide or monitor treatment for MRSA infections. RESULT CALLED TO, READ BACK BY AND VERIFIED WITH: Lillie Fragmin RN 8:25 02/14/20 ([email or web address]) Performed at [facility], [place] [address] [place], [place] [address]   CSF culture     Status: None   Collection Time: 02/14/20 11:01 AM   Specimen: CSF; Cerebrospinal Fluid  Result Value Ref Range Status   Specimen Description CSF  Final   Special Requests NONE  Final   Gram Stain   Final    WBC PRESENT,BOTH PMN AND MONONUCLEAR NO ORGANISMS SEEN CYTOSPIN SMEAR    Culture   Final    NO GROWTH 3 DAYS Performed at [facility], [place] [address], [place], [place] [address]    Report Status 02/17/2020 FINAL  Final  Acid Fast Smear (AFB)     Status: None   Collection Time: 02/17/20  1:11 PM   Specimen: PATH Cytology CSF  Result Value Ref Range Status   AFB Specimen Processing Concentration  Final   Acid Fast Smear Negative  Final    Comment: (NOTE) Performed At: [facility] [place], [place] [id number] Rush Farmer MD [id number]    Source (AFB) CSF  Final    Comment: Performed at [facility], [place] [address], [place], [place] [address]  Culture, fungus without smear     Status: None (Preliminary result)   Collection Time: 02/17/20  1:14 PM   Specimen: CSF; Cerebrospinal Fluid  Result Value Ref Range Status   Specimen Description CSF  Final   Special Requests NONE  Final   Culture   Final    NO FUNGUS ISOLATED AFTER 10 DAYS Performed at [facility], [place] [address], [place], [place] [address]    Report Status PENDING  Incomplete  Culture, fungus without smear     Status: Abnormal (Preliminary result)   Collection Time: 02/22/20 11:38 AM   Specimen: CSF; Other  Result Value Ref Range Status   Specimen Description CSF TUBE 2  Final   Special  Requests Immunocompromised  Final   Culture (A)  Final    [facility] CRITICAL RESULT CALLED TO, READ BACK BY AND VERIFIED WITH: DR Drucilla Schmidt @0808  02/27/20 EB Sent to [place] for further susceptibility testing. Performed at [facility], [place] [address], [place], [place] [address]    Report Status PENDING  Incomplete  Acid Fast Smear (AFB)     Status: None   Collection Time: 02/22/20 11:38 AM   Specimen: CSF; Cerebrospinal Fluid  Result Value Ref Range Status   AFB Specimen Processing Concentration  Final   Acid Fast Smear Negative  Final    Comment: (NOTE) Performed At: [facility] [place], [place] [id number] Rush Farmer MD [id number]    Source (AFB) CSF  Final    Comment: TUBE 2 Performed at [facility], [address], [place],  [address]   MTB RIF NAA Non-Sputum, w/o Culture     Status: None  Collection Time: 02/22/20 11:38 AM  Result Value Ref Range Status   Source CSF  Final    Comment: Performed at [facility], [place] [address], [place], [place] [address]   Specimen Source Comment  Final    Comment: Cerebrospinal fluid (CSF)   M tuberculosis complex Comment NOT DETECTED Final    Comment: (NOTE) Mycobacterium tuberculosis complex (MTBC) NOT detected. This test was developed and its performance characteristics determined by [facility]. It has not been cleared or approved by the Food and Drug Administration. Per the College of American Pathologists (CAP) guidelines, a culture must be performed on all samples regardless of the molecular test result. By ordering this test code, the client has assumed responsibility for the performance of the culture.    Rifampin Comment Susceptible Final    Comment: (NOTE) Because Mycobacterium tuberculosis complex (MTBC) was not detected, no rifampin determination is possible. This test was developed and its performance characteristics determined by [facility]. It has not been  cleared or approved by the Food and Drug Administration.    AFB Specimen Processing Concentration  Final    Comment: (NOTE) Performed At: [facility] [place], [place] [id number] Rush Farmer MD [id number]     Studies/Results: DG Swallowing Func-Speech Pathology  Result Date: 02/28/2020 Objective Swallowing Evaluation: Type of Study: MBS-Modified Barium Swallow Study  Patient Details Name: Howard Allen MRN: [id number] Date of Birth: Oct 22, 1945 Today's Date: 02/28/2020 Time: SLP Start Time (ACUTE ONLY): 0825 -SLP Stop Time (ACUTE ONLY): 0845 SLP Time Calculation (min) (ACUTE ONLY): 20 min Past Medical History: Past Medical History: Diagnosis Date . Aseptic meningitis 02/21/2020 . Weight loss 02/21/2020 Past Surgical History:  The histories are not reviewed yet. Please review them in the "History" navigator section and refresh this [place]. HPI: 74 y.o. 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   Subjective: alert and following commands well Assessment / Plan / Recommendation CHL IP CLINICAL IMPRESSIONS 02/28/2020 Clinical Impression Pt demonstrates improvements since initial MBS, including improved mentation for command following to attempt strategies as well as improved oral control and timing. He has no oral loss this time, and pt self-manages oral residue with solids by using multiple swallows. Lingual pumping is noted that mildly reduces efficiency, but is still functional. Thin liquids still enter the airway before the swallow given the above combined with reduced coordination for airway closure. Aspiration is in small amounts, as is penetration to  the vocal folds, but it is still silent. A cued throat clear helps to clear the airway. A chin tuck improves airway protection by better containing thin liquids within the valleculae before swallowing, but he has a little trouble with maintaining a full chin tuck throughout the duration of the swallow. When he does not stay in a full chin tuck, penetration/aspiration still occur. Recommend advancing diet to Dys 3 solids and nectar thick liquids with good potential to advance to thin liquids as he can more consistently use a chin tuck.  SLP Visit Diagnosis Dysphagia, oropharyngeal phase (R13.12) Attention and concentration deficit following -- Frontal lobe and executive function deficit following -- Impact on safety and function Mild aspiration risk;Moderate aspiration risk   CHL IP TREATMENT RECOMMENDATION 02/28/2020 Treatment  Recommendations Therapy as outlined in treatment plan below   Prognosis 02/28/2020 Prognosis for Safe Diet Advancement Good Barriers to Reach Goals Cognitive deficits Barriers/Prognosis Comment -- CHL IP DIET RECOMMENDATION 02/28/2020 SLP Diet Recommendations Dysphagia 3 (Mech soft) solids;Nectar thick liquid Liquid Administration via Cup;Straw Medication Administration Crushed with puree Compensations Minimize environmental distractions;Slow rate;Small sips/bites Postural Changes Seated upright at 90 degrees   CHL IP OTHER RECOMMENDATIONS 02/28/2020 Recommended Consults -- Oral Care Recommendations Oral care BID Other Recommendations --   CHL IP FOLLOW UP RECOMMENDATIONS 02/28/2020 Follow up Recommendations Skilled Nursing facility   [facility] IP FREQUENCY AND DURATION 02/28/2020 Speech Therapy Frequency (ACUTE ONLY) min 2x/week Treatment Duration 2 weeks      CHL IP ORAL PHASE 02/28/2020 Oral Phase Impaired Oral - Pudding Teaspoon -- Oral - Pudding Cup -- Oral - Honey Teaspoon -- Oral - Honey Cup NT Oral - Nectar Teaspoon -- Oral - Nectar Cup Lingual pumping;Delayed oral transit Oral - Nectar  Straw Lingual pumping;Delayed oral transit Oral - Thin Teaspoon -- Oral - Thin Cup Premature spillage Oral - Thin Straw Premature spillage Oral - Puree Lingual pumping;Delayed oral transit Oral - Mech Soft Lingual pumping;Delayed oral transit;Lingual/palatal residue Oral - Regular -- Oral - Multi-Consistency -- Oral - Pill Lingual pumping;Delayed oral transit;Lingual/palatal residue;Other (Comment) Oral Phase - Comment --  CHL IP PHARYNGEAL PHASE 02/28/2020 Pharyngeal Phase Impaired Pharyngeal- Pudding Teaspoon -- Pharyngeal -- Pharyngeal- Pudding Cup -- Pharyngeal -- Pharyngeal- Honey Teaspoon -- Pharyngeal -- Pharyngeal- Honey Cup NT Pharyngeal -- Pharyngeal- Nectar Teaspoon -- Pharyngeal -- Pharyngeal- Nectar Cup Reduced anterior laryngeal mobility;Reduced tongue base retraction Pharyngeal Material does not enter airway Pharyngeal- Nectar Straw Reduced anterior laryngeal mobility;Reduced tongue base retraction Pharyngeal Material does not enter airway Pharyngeal- Thin Teaspoon -- Pharyngeal -- Pharyngeal- Thin Cup Reduced anterior laryngeal mobility;Reduced tongue base retraction;Penetration/Aspiration before swallow Pharyngeal Material enters airway, passes BELOW cords without attempt by patient to eject out (silent aspiration) Pharyngeal- Thin Straw Reduced anterior laryngeal mobility;Reduced tongue base retraction;Penetration/Aspiration before swallow Pharyngeal Material enters airway, passes BELOW cords without attempt by patient to eject out (silent aspiration) Pharyngeal- Puree Reduced anterior laryngeal mobility;Reduced tongue base retraction;Pharyngeal residue - valleculae Pharyngeal -- Pharyngeal- Mechanical Soft Reduced anterior laryngeal mobility;Reduced tongue base retraction;Pharyngeal residue - valleculae Pharyngeal -- Pharyngeal- Regular -- Pharyngeal -- Pharyngeal- Multi-consistency -- Pharyngeal -- Pharyngeal- Pill -- Pharyngeal -- Pharyngeal Comment --  CHL IP CERVICAL ESOPHAGEAL PHASE  02/28/2020 Cervical Esophageal Phase WFL Pudding Teaspoon -- Pudding Cup -- Honey Teaspoon -- Honey Cup -- Nectar Teaspoon -- Nectar Cup -- Nectar Straw -- Thin Teaspoon -- Thin Cup -- Thin Straw -- Puree -- Mechanical Soft -- Regular -- Multi-consistency -- Pill -- Cervical Esophageal Comment -- Osie Bond., M.A. [place] Pager [phone] Office [phone] 02/28/2020, 9:38 AM                 Assessment/Plan:  INTERVAL HISTORY:   Awaiting studies on CSF  Principal Problem:   Aseptic meningitis Active Problems:   Seizure ([place])   Weight loss   Encounter for nasogastric (NG) tube placement    Howard Allen is a 74 y.o. 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  HSV 1 and 2 PCR's were negative x2 CSF cultures originally taken were without growth.  VZV PCR negative  Histoplasma and Blastomyces antigen in urine is negative CSF for MTB PCR negative X2 LP's  He has multiple studies pending and I double checked with Philippa Chester in micro and they are still not back including  including:  AFB culture [place] (AFB stains were negative x2 Fungal culture--note I have asked him to incubate fungal cultures for 6 weeks (did have candida on one which is a contaminant)    CSF for Coccidioides antibiotic bodies, CSF Blastomyces antigen CSF histoplasma antigen,CSF for paraneoplastic panel per Neurology   Sensitivity of MTB PCR for TB meningitis is roughly 50% in some studies and he has 2 negative MTB PCR   For now I would favor nonetheless continuing treatment for possible TB and fungal meningitis  Once we have fungal ag, serologies back from CSF we may either have a diagnosis OR w negative assays reason to then promptly proceed to look again for carcinomatous meningitis  Dr Gale Journey is available for questions this weekend and will  follow up on studies Dr. Linus Salmons will take over the service on Monday.       LOS: 16 days   Alcide Evener 02/29/2020, 12:11 PM

## 2020-02-29 NOTE — Progress Notes (Signed)
  Speech Language Pathology Treatment: Dysphagia;Cognitive-Linquistic  Patient Details Name: Howard Allen MRN: [id number] DOB: 07-28-1945 Today's Date: 02/29/2020 Time: 9753-0051 SLP Time Calculation (min) (ACUTE ONLY): 19 min  Assessment / Plan / Recommendation Clinical Impression  Pt was seen for treatment and was cooperative during the session. He consumed nectar thick liquids without s/sx of aspiration but refused trials of more advanced liquids due to c/o being full from breakfast. Pt was able to recall what he ate this morning with verbal prompts. He identified the correct time on pictured clocks with 100% accuracy. He completed time management problems with 40% accuracy increasing to 100% with verbal prompts. He recalled concrete information from recorded voicemails with 57% accuracy increasing to 71% accuracy with cues for use of memory aids. SLP will continue to follow pt.    HPI HPI: 74 y.o. 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        SLP Plan  Continue with current plan of care       Recommendations  Diet recommendations: Dysphagia 3 (mechanical soft);Nectar-thick liquid Liquids provided via: Cup;Straw Medication Administration: Crushed with puree Supervision: Patient able to self feed;Full supervision/cueing for compensatory strategies Compensations: Minimize environmental distractions;Slow rate;Small sips/bites Postural Changes and/or Swallow Maneuvers: Seated upright 90 degrees                Oral Care Recommendations: Oral care BID Follow up Recommendations: Skilled Nursing facility SLP Visit Diagnosis: Dysphagia, oropharyngeal phase  (R13.12) Plan: Continue with current plan of care       Howard Allen, [place], [place] Office number [phone] Pager [place] 02/29/2020, 12:23 PM

## 2020-02-29 NOTE — Progress Notes (Addendum)
Nutrition Follow-up  DOCUMENTATION CODES:   Non-severe (moderate) malnutrition in context of chronic illness  INTERVENTION:   Recommend addition of phosphorus binder -- discussed with MD  Hormel shake po TID, each supplement provides 500 kcal and 22 grams of protein (36m phosphorus)  Continue Magic cup TID with meals, each supplement provides 290 kcal and 9 grams of protein  Continue MVI daily  If pt's intake does not improve with additional supplements ordered, recommend re-initiation of enteral nutrition to supplement po diet to help pt meet kcal/protein needs.  NUTRITION DIAGNOSIS:   Moderate Malnutrition related to chronic illness as evidenced by mild fat depletion, moderate fat depletion, mild muscle depletion, moderate muscle depletion.  ongoing  GOAL:   Patient will meet greater than or equal to 90% of their needs  Not met  MONITOR:   PO intake, Supplement acceptance, Labs, Weight trends  REASON FOR ASSESSMENT:   Consult Assessment of nutrition requirement/status (Weight in January 2021 was 196 lbs. 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 PMH includes hx of Crohn's, prior SBO, HTN, HLD, depression  10/7 LP consistent with meningitis 10/8 extubated 10/11 diet advanced to dysphagia 2 with honey thick liquids  10/15 large volume LP positive for candida  10/21 diet advanced to dysphagia 3 with nectar thick liquids  RD consulted due to pt's phosphorus levels rising -- MD questioning whether pt consuming supplement that is too high in phosphorus as pt had previously had hypophosphatemia. This is unlikely as pt has only been receiving Magic Cup TID, which contains 1963mphosphorus per serving, and pt has not been eating these supplements in their entirety. Recommend placing pt on a  phosphorus binder to help correct hyperphosphatemia. Discussed with MD.   Note that pt's intake has decreased more since last RD assessment on 10/18. Pt has consumed 10-25% of the last 8 documented meals (20% average meal intake). Now that pt's diet has been advanced to nectar thick liquids, will re-order [employer] shakes to provide pt with additional kcals/protein. Will order these between meals as discussed with MD as pt seems to be experiencing early satiety. If pt's intake does not improve, recommend re-initiation of enteral nutrition.   UOP: [id number] hours I/O: +13,691.2ml71mnce admit  Labs: Na 131 (L), Phosphorus 6.6 (H), CBGs 63-87-77 Medications: Decadron, Colace, ss Novolog, MVI, Protonix, Miralax, Klor-con, Vitamin B6, Vitamin B12  Diet Order:   Diet Order            DIET DYS 3 Room service appropriate? Yes with Assist; Fluid consistency: Nectar Thick  Diet effective now                 EDUCATION NEEDS:   No education needs have been identified at this time  Skin:  Skin Assessment: Skin Integrity Issues: Skin Integrity Issues:: Other (Comment) Other: skin tear L arm  Last BM:  10/21 type 6  Height:   Ht Readings from Last 1 Encounters:  02/13/20 _0  (1.778 m)    Weight:   Wt Readings from Last 1 Encounters:  02/29/20 63.2 kg   BMI:  Body mass index is 19.99 kg/m.  Estimated Nutritional Needs:   Kcal:  1785-1965 kcal  Protein:  90-100 grams  Fluid:  >/= 1.8 L    AmanLarkin Ina, RD, LDN RD pager number and  weekend/on-call pager number located in [place].

## 2020-02-29 NOTE — Progress Notes (Signed)
PROGRESS NOTE    Howard Allen  ZOX:[id number] DOB: May 18, 1945 DOA: 02/13/2020 PCP: System, Provider Not In   Brief Narrative: The patient is a 74 year old Caucasian male with a past medical history significant for Crohn's disease, hypertension, hyperlipidemia, depression history of hospitalization in June 2021 for pneumatosis status post ileocolonic resection and lysis of adhesions who presented with multiple tonic-clonic seizures complicated by status epilepticus that required intubation and loading with Keppra. CT of the head was negative and initially when he was brought in to the ED by EMS there was at least 3 witnessed generalized tonic-clonic seizures noted. PCCM was consulted for admission given that he was intubated on arrival and unresponsive. He was intubated on 02/13/2020 and extubated 02/15/2020. Neurology was consulted and he underwent further work-up and was found to have a signal abnormality in the left limb is most consistent with an acute infarct. Echocardiogram done showed an EF of 55 to 60% with moderate LVH and grade 1 diastolic dysfunction. EEG testing on 10/9 was negative. 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 He underwent several viral serologies and had a lumbar puncture sent on 02/14/2020. He was started on IV vancomycin, IV ceftriaxone, IV ampicillin and IV acyclovir for suspected meningitis. CSF analysis was consistent with aseptic meningitis with high white blood cells and very low glucose level. CSF culture was initally negative. He was transferred to [facility] service on 02/19/2020 and remained confused. Subsequently ID was consulted and they were suspecting aseptic meningoencephalitis and recommended large volume LP. Patient was already on aspirin and Plavix for past 5 days. His Plavix was stopped on 02/20/2020. P2 Y 12 was checked and based on that, neurology performed another large-volume LP on 02/22/2020 and several labs were sent by ID. 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   Assessment & Plan:   Principal Problem:   Aseptic meningitis Active Problems:   Seizure ([name])   Weight loss   Encounter for nasogastric (NG) tube placement   Aseptic meningitis  - LP performed on 10/7 consistent with meningitis - LP Cultures positive for candida as of 02/27/20 - already on amphotericin B - ID following - appreciate insight and recommendations - VDLR and cryptococcal antigen negative., and [place]ile serology pending - Quantiferon gold was indeterminate.  Patient was on vancomycin, ceftriaxone and acyclovir.  Consulted ID on 02/20/2020.  Dr. Tommy Medal recommended repeat large-volume LP to send more labs. Rocephin and vancomycin discontinued but acyclovir continues.  Had a long multidisciplinary discussion between me, Dr. Tommy Medal and Dr. Hortense Ramal of neurology on 02/21/2020.  - Patient finally underwent large-volume LP by neurology on 02/22/2020. 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   - HCV PCR once again negative.   - Plavix on hold as ID plans to do third LP - will wait until remainder of labs result per ID before moving forward with repeat LP  - Per ID: currently waiting for fungal antigen/serologies back from CSF for hopeful diagnosis OR w negative assays  - will repeat LP to look again for carcinomatous meningitis - stay on abx to verify if CSF appearance changes on repeat testing.  Large hematoma above right eye, resolving  - He developed large hematoma on the morning of 02/22/2020 after he is continued to pluck his right forehead laceration.  -  Initially he was unable to open his eye but there was no pressure on the eyeball. For this reason, I had consulted ENT and he was evaluated by them but they did not feel the need to evacuate  hematoma.  - Patient's hematoma is decreasing in size and he is able to open about 90% of his right eye.  No more bleeding.  Acute ischemic infarct  - Seen on MRI imaging on 10/7: left thalamic signal abnormality measuring 2.3 cm most consistent with acute infarct; no edema or hemorrhagic conversion. (neuro suspects 2/2 prolonged seizure) - Will need outpt MRI in 8-12 weeks to help delineate this -outpt neuro f/u as well - CTA head and neck without large vessel occlusion or proximal stenosis, small ICA aneurysm 2 mm, right upper pulmonary nodule 5 mm, recommend outpatient follow-up imaging per protocol. - Now on atorvastatin 80 mg despite that he takes 20 at home given his acute CVA - TTE negative for vegetations or thrombosis.  - Continue high intensity statin; Lipid panel done and showed a total cholesterol/HDL ratio 3.3, cholesterol level 154, HDL level 46, LDL 79, triglycerides 146, VLDL 29 - PT/OT recommending CIR - appreciate neurology evaluation/recommendations.  Currently on aspirin, Plavix on hold for possible repeat LP -  see above  Tonic-Clonic Seizures Status Epilepticus: Resolved - Secondary to meningitis  - Neurology on board; appreciate their recommendations - Continue Keppra.  Hypomagnesemia - Resolved.  Hyperphosphatemia - Follow PTH, CPK levels - Previously low in the setting of malnutrition/refeeding syndrome - Likely compounded by AKI as below  B12 Deficiency  Moderate Malnutrition - Likely due to malnutrition and anorexia, possibly worsened by malabsorption given bowel inflammation seen on imaging the past several months prior to resection. Of note, 70 lb weight loss in the past 10 months.  - Consult to dietician for tube feed initiation, he was started on tube feedings which was discontinued now that he is tolerating dysphagia 2 diet. Nutritionist now recommending supplemental shake twice daily as well as Magic cup twice daily with meals and multivitamin with  minerals daily.  HTN/HLD - Home medications include Toprol 100 mg, Amlodipine 5 mg, Lipitor 20 mg, Fenofibrate 160 mg.  Blood pressure very well controlled.  Will continue amlodipine to 10 mg and Toprol-XL 100 mg p.o. daily.   Normocytic Anemia  Hx of Iron Deficiency Anemia B12 Deficiency  - B12 levels low. Iron panel shows low saturation, however normal iron and TIBC, unclear picture as not consistent completely with iron deficiency anemia or anemia of chronic disease.  Hemoglobin is stable.  Questionable Crohn's Disease s/p Ileocolic Resection  - No longer on immunosuppressive therapy. - Takes budesonide as an outpatient -currently on hold  AKI on CKD stage II, stable - Slight jump in creatinine.   - Likely due to dehydration, DC fluids and increase PO intake as appropriate  Lab Results  Component Value Date   CREATININE 1.42 (H) 02/29/2020    DVT prophylaxis: SCDs Heparin 5000 units subcu every 8 hours Code Status: FULL CODE  Family Communication: Daughter Melanie updated Disposition Plan: Plan to discharge to CIR once cleared by ID and neurology.  Status is: Inpatient  Remains inpatient appropriate because:Altered mental status, Unsafe d/c plan, IV treatments appropriate due to intensity of illness or inability to take PO and Inpatient level of care appropriate due to severity of illness  Dispo: The patient is from: Home              Anticipated d/c is to:  CIR              Anticipated d/c date is: >4 days              Patient currently is not medically stable to d/c.  Consultants:   Neurology  Infectious disease  PCCM Transfer   Procedures:  10/6 >> ETT placed 10/7 >> LP 10/8 >> Extubated   Significant Diagnostic Tests:  10/6 CT Head >> No acute intracranial abnormalities; age-related volume loss and chronic small vessel disease 10/7 MRI brain: 1. Signal abnormality in the left thalamus most consistent with an acute infarct. 2. Signal abnormality in the  mesial left temporal lobe and splenium of the corpus callosum which may reflect acute infarct, sequelae of recent seizure activity, or encephalitis (including herpes encephalitis). No contralateral cerebral involvement. 3. Mild chronic small vessel ischemic disease. 10/8 echo: LVEF 55-60%, mod LVH, Grade I diastolic dysfunction 31/5 CT Head >> Progressive low density in the left thalamus related to acute infarction; no hemorrhage 10/9 EEG >> Negative  Micro Data:  10/6 COVID-19 + influenza >> Negative  10/6 Blood Cultures >> NGTD 10/7 CSF Culture >> NGTD 10/7 HSV serology >> 2.02 on igg testing. Will send pcr to determine past or active.  10/7 West Nile serology >>  10/7 VRDL serology >> 10/8 Quanitferon Gold  >> indeterminant 10/9 acid fast csf: Pending 10/11 HSV PCR: pending  10/15 -CSF culture -Candida albicans  Antimicrobials:  Anti-infectives (From admission, onward)   Start     Dose/Rate Route Frequency Ordered Stop   02/26/20 1115  rifampin (RIFADIN) 600 mg in sodium chloride 0.9 % 100 mL IVPB        600 mg 200 mL/hr over 30 Minutes Intravenous Every 24 hours 02/26/20 1026     02/23/20 0100  acyclovir (ZOVIRAX) 615 mg in dextrose 5 % 100 mL IVPB  Status:  Discontinued        10 mg/kg  61.3 kg 112.3 mL/hr over 60 Minutes Intravenous Every 12 hours 02/22/20 1537 02/24/20 0936   02/22/20 2100  amphotericin B liposome (AMBISOME) 310 mg in dextrose 5 % 500 mL IVPB        5 mg/kg  61.3 kg 288.8 mL/hr over 120 Minutes Intravenous Every 24 hours 02/22/20 1524     02/22/20 1800  rifampin (RIFADIN) capsule 600 mg  Status:  Discontinued        600 mg Oral Daily 02/22/20 1520 02/26/20 1026   02/22/20 1800  isoniazid (NYDRAZID) tablet 300 mg        300 mg Oral Daily 02/22/20 1520     02/22/20 1800  pyrazinamide tablet 1,500 mg        1,500 mg Oral Daily 02/22/20 1520     02/22/20 1800  ethambutol (MYAMBUTOL) tablet 1,200 mg        1,200 mg Oral Daily 02/22/20 1520     02/20/20  1000  vancomycin (VANCOCIN) IVPB 1000 mg/200 mL premix  Status:  Discontinued        1,000 mg 200 mL/hr over 60 Minutes Intravenous Every 24 hours 02/20/20 0555 02/21/20 1259   02/19/20 1811  vancomycin variable dose per unstable renal function (pharmacist dosing)  Status:  Discontinued         Does not apply See admin instructions 02/19/20 1811 02/20/20 0555   02/18/20 0500  vancomycin (VANCOREADY) IVPB 750 mg/150 mL  Status:  Discontinued        750 mg 150 mL/hr over 60 Minutes Intravenous  Every 12 hours 02/17/20 1651 02/19/20 1811   02/15/20 0300  vancomycin (VANCOREADY) IVPB 500 mg/100 mL        500 mg 100 mL/hr over 60 Minutes Intravenous Every 12 hours 02/14/20 1352 02/17/20 1755   02/14/20 1400  acyclovir (ZOVIRAX) 690 mg in dextrose 5 % 100 mL IVPB  Status:  Discontinued        10 mg/kg  68.9 kg 113.8 mL/hr over 60 Minutes Intravenous Every 12 hours 02/14/20 1245 02/22/20 1537   02/14/20 1400  vancomycin (VANCOREADY) IVPB 1250 mg/250 mL        1,250 mg 166.7 mL/hr over 90 Minutes Intravenous STAT 02/14/20 1352 02/14/20 1655   02/14/20 1300  cefTRIAXone (ROCEPHIN) 2 g in sodium chloride 0.9 % 100 mL IVPB  Status:  Discontinued        2 g 200 mL/hr over 30 Minutes Intravenous Every 12 hours 02/14/20 1245 02/20/20 1732   02/14/20 1300  ampicillin (OMNIPEN) 2 g in sodium chloride 0.9 % 100 mL IVPB  Status:  Discontinued        2 g 300 mL/hr over 20 Minutes Intravenous Every 6 hours 02/14/20 1245 02/18/20 1051       Subjective: No acute issues or events overnight, patient awake alert oriented to person and general situation, correctly recalls his home city state, family members but has difficulty orienting to place and time.  Answers most questions with "I do not know" otherwise.  Review of systems somewhat limited.  Objective: Vitals:   02/28/20 2026 02/28/20 2316 02/29/20 0409 02/29/20 0500  BP: 131/80 135/76 137/79   Pulse: (!) 52 (!) 55 (!) 56   Resp: 19 18 17    Temp: 98.6  F (37 C) 97.7 F (36.5 C) 98.6 F (37 C)   TempSrc:  Oral Axillary   SpO2: 96% 99% 98%   Weight:    63.2 kg  Height:        Intake/Output Summary (Last 24 hours) at 02/29/2020 0728 Last data filed at 02/29/2020 0616 Gross per 24 hour  Intake 2134.32 ml  Output 1700 ml  Net 434.32 ml   Filed Weights   02/27/20 0001 02/28/20 0426 02/29/20 0500  Weight: 63.3 kg 63 kg 63.2 kg   Examination:  General:  Pleasantly resting in bed, No acute distress.  Alert to person only HEENT: Large ecchymosis and hematoma over right lateral eye.  Sclerae nonicteric, noninjected.  Extraocular movements intact bilaterally. Neck:  Without mass or deformity.  Trachea is midline. Lungs:  Clear to auscultate bilaterally without rhonchi, wheeze, or rales. Heart:  Regular rate and rhythm.  Without murmurs, rubs, or gallops. Abdomen:  Soft, nontender, nondistended.  Without guarding or rebound. Extremities: Without cyanosis, clubbing, edema, or obvious deformity. Vascular:  Dorsalis pedis and posterior tibial pulses palpable bilaterally. Skin:  Warm and dry, no erythema, no ulcerations.   Data Reviewed: I have personally reviewed following labs and imaging studies  CBC: No results for input(s): WBC, NEUTROABS, HGB, HCT, MCV, PLT in the last 168 hours. Basic Metabolic Panel: Recent Labs  Lab 02/24/20 0256 02/25/20 0216 02/26/20 0240 02/26/20 0803 02/27/20 0501 02/28/20 0528  NA 132* 132* 130*  --  132* 130*  K 4.3 4.1 3.9  --  4.9 4.0  CL 104 106 100  --  105 102  CO2 20* 17* 19*  --  16* 18*  GLUCOSE 83 100* 88  --  99 134*  BUN 23 25* 30*  --  33* 33*  CREATININE 1.21 1.23 1.35*  --  1.30* 1.36*  CALCIUM 8.8* 9.1 9.3  --  9.4 9.3  MG 1.8 1.7  --  2.4 2.0 1.8  PHOS 3.2 3.7 5.8*  --  6.0* 6.6*   GFR: Estimated Creatinine Clearance: 43.9 mL/min (A) (by C-G formula based on SCr of 1.36 mg/dL (H)). Liver Function Tests: Recent Labs  Lab 02/23/20 0341  AST 22  ALT 18  ALKPHOS 36*    BILITOT 0.5  PROT 5.9*  ALBUMIN 3.2*   No results for input(s): LIPASE, AMYLASE in the last 168 hours. No results for input(s): AMMONIA in the last 168 hours. Coagulation Profile: No results for input(s): INR, PROTIME in the last 168 hours. Cardiac Enzymes: Recent Labs  Lab 02/28/20 1413  CKTOTAL 22*   BNP (last 3 results) No results for input(s): PROBNP in the last 8760 hours. HbA1C: No results for input(s): HGBA1C in the last 72 hours. CBG: Recent Labs  Lab 02/28/20 2028 02/28/20 2157 02/28/20 2228 02/29/20 0108 02/29/20 0413  GLUCAP 54* 54* 89 138* 90   Lipid Profile: No results for input(s): CHOL, HDL, LDLCALC, TRIG, CHOLHDL, LDLDIRECT in the last 72 hours. Thyroid Function Tests: No results for input(s): TSH, T4TOTAL, FREET4, T3FREE, THYROIDAB in the last 72 hours. Anemia Panel: No results for input(s): VITAMINB12, FOLATE, FERRITIN, TIBC, IRON, RETICCTPCT in the last 72 hours. Sepsis Labs: No results for input(s): PROCALCITON, LATICACIDVEN in the last 168 hours.  Recent Results (from the past 240 hour(s))  Culture, fungus without smear     Status: Abnormal (Preliminary result)   Collection Time: 02/22/20 11:38 AM   Specimen: CSF; Other  Result Value Ref Range Status   Specimen Description CSF TUBE 2  Final   Special Requests Immunocompromised  Final   Culture (A)  Final    [facility] CRITICAL RESULT CALLED TO, READ BACK BY AND VERIFIED WITH: DR Drucilla Schmidt @[phone]  02/27/20 EB Sent to [facility] for further susceptibility testing. Performed at [facility], [place] [address], [place], [place] [address]    Report Status PENDING  Incomplete  Acid Fast Smear (AFB)     Status: None   Collection Time: 02/22/20 11:38 AM   Specimen: CSF; Cerebrospinal Fluid  Result Value Ref Range Status   AFB Specimen Processing Concentration  Final   Acid Fast Smear Negative  Final    Comment: (NOTE) Performed At: [facility] [place], [place]  [id number] Rush Farmer MD QQ:[id number]    Source (AFB) CSF  Final    Comment: TUBE 2 Performed at [facility], [address], [place], [place] [address]   MTB RIF NAA Non-Sputum, w/o Culture     Status: None   Collection Time: 02/22/20 11:38 AM  Result Value Ref Range Status   Source CSF  Final    Comment: Performed at [facility], [place] [address], [place], [place] [address]   Specimen Source Comment  Final    Comment: Cerebrospinal fluid (CSF)   M tuberculosis complex Comment NOT DETECTED Final    Comment: (NOTE) Mycobacterium tuberculosis complex (MTBC) NOT detected. This test was developed and its performance characteristics determined by [facility]. It has not been cleared or approved by the Food and Drug Administration. Per the College of American Pathologists (CAP) guidelines, a culture must be performed on all samples regardless of the molecular test result. By ordering this test code, the client has assumed responsibility for the performance of the culture.  Rifampin Comment Susceptible Final    Comment: (NOTE) Because Mycobacterium tuberculosis complex (MTBC) was not detected, no rifampin determination is possible. This test was developed and its performance characteristics determined by [facility]. It has not been cleared or approved by the Food and Drug Administration.    AFB Specimen Processing Concentration  Final    Comment: (NOTE) Performed At: [facility] [place], [place] [id number] Rush Farmer MD BS:[id number]      RN Pressure Injury Documentation:     Estimated body mass index is 19.99 kg/m as calculated from the following:   Height as of this encounter: 5\' 10"  (1.778 m).   Weight as of this encounter: 63.2 kg.  Malnutrition Type:  Nutrition Problem: Moderate Malnutrition   Malnutrition Characteristics:  Signs/Symptoms: mild fat depletion, moderate fat depletion, mild muscle depletion, moderate muscle  depletion   Nutrition Interventions:  Interventions: Hormel Shake, Magic cup, MVI   Radiology Studies: DG Swallowing Func-Speech Pathology  Result Date: 02/28/2020 Objective Swallowing Evaluation: Type of Study: MBS-Modified Barium Swallow Study  Patient Details Name: Garlen Reinig MRN: [id number] Date of Birth: [birth year] Today's Date: 02/28/2020 Time: SLP Start Time (ACUTE ONLY): 0825 -SLP Stop Time (ACUTE ONLY): 0845 SLP Time Calculation (min) (ACUTE ONLY): 20 min Past Medical History: Past Medical History: Diagnosis Date . Aseptic meningitis 02/21/2020 . Weight loss 02/21/2020 Past Surgical History:  The histories are not reviewed yet. Please review them in the "History" navigator section and refresh this [place]. HPI: 74 y.o. 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   Subjective: alert and following commands well Assessment / Plan / Recommendation CHL IP CLINICAL IMPRESSIONS 02/28/2020 Clinical Impression Pt demonstrates improvements since initial MBS, including improved mentation for command following to attempt strategies as well as improved oral control and timing. He has no oral loss this time, and pt self-manages oral residue with solids by using multiple swallows. Lingual pumping is noted that mildly reduces efficiency, but is still functional. Thin liquids still enter the airway before the swallow given the above combined with reduced coordination for airway closure. Aspiration is in small amounts, as is penetration to the vocal folds, but it is still silent. A cued throat clear helps to clear the airway. A chin tuck improves airway protection by better containing thin  liquids within the valleculae before swallowing, but he has a little trouble with maintaining a full chin tuck throughout the duration of the swallow. When he does not stay in a full chin tuck, penetration/aspiration still occur. Recommend advancing diet to Dys 3 solids and nectar thick liquids with good potential to advance to thin liquids as he can more consistently use a chin tuck.  SLP Visit Diagnosis Dysphagia, oropharyngeal phase (R13.12) Attention and concentration deficit following -- Frontal lobe and executive function deficit following -- Impact on safety and function Mild aspiration risk;Moderate aspiration risk   CHL IP TREATMENT RECOMMENDATION 02/28/2020 Treatment Recommendations Therapy as outlined in treatment plan below   Prognosis 02/28/2020 Prognosis for Safe Diet Advancement Good Barriers to Reach Goals Cognitive deficits Barriers/Prognosis Comment -- CHL IP DIET RECOMMENDATION 02/28/2020 SLP Diet Recommendations Dysphagia 3 (Mech soft) solids;Nectar thick liquid Liquid Administration via Cup;Straw Medication Administration Crushed with puree Compensations  Minimize environmental distractions;Slow rate;Small sips/bites Postural Changes Seated upright at 90 degrees   CHL IP OTHER RECOMMENDATIONS 02/28/2020 Recommended Consults -- Oral Care Recommendations Oral care BID Other Recommendations --   CHL IP FOLLOW UP RECOMMENDATIONS 02/28/2020 Follow up Recommendations Skilled Nursing facility   [facility] IP FREQUENCY AND DURATION 02/28/2020 Speech Therapy Frequency (ACUTE ONLY) min 2x/week Treatment Duration 2 weeks      CHL IP ORAL PHASE 02/28/2020 Oral Phase Impaired Oral - Pudding Teaspoon -- Oral - Pudding Cup -- Oral - Honey Teaspoon -- Oral - Honey Cup NT Oral - Nectar Teaspoon -- Oral - Nectar Cup Lingual pumping;Delayed oral transit Oral - Nectar Straw Lingual pumping;Delayed oral transit Oral - Thin Teaspoon -- Oral - Thin Cup Premature spillage Oral - Thin Straw Premature spillage Oral - Puree  Lingual pumping;Delayed oral transit Oral - Mech Soft Lingual pumping;Delayed oral transit;Lingual/palatal residue Oral - Regular -- Oral - Multi-Consistency -- Oral - Pill Lingual pumping;Delayed oral transit;Lingual/palatal residue;Other (Comment) Oral Phase - Comment --  CHL IP PHARYNGEAL PHASE 02/28/2020 Pharyngeal Phase Impaired Pharyngeal- Pudding Teaspoon -- Pharyngeal -- Pharyngeal- Pudding Cup -- Pharyngeal -- Pharyngeal- Honey Teaspoon -- Pharyngeal -- Pharyngeal- Honey Cup NT Pharyngeal -- Pharyngeal- Nectar Teaspoon -- Pharyngeal -- Pharyngeal- Nectar Cup Reduced anterior laryngeal mobility;Reduced tongue base retraction Pharyngeal Material does not enter airway Pharyngeal- Nectar Straw Reduced anterior laryngeal mobility;Reduced tongue base retraction Pharyngeal Material does not enter airway Pharyngeal- Thin Teaspoon -- Pharyngeal -- Pharyngeal- Thin Cup Reduced anterior laryngeal mobility;Reduced tongue base retraction;Penetration/Aspiration before swallow Pharyngeal Material enters airway, passes BELOW cords without attempt by patient to eject out (silent aspiration) Pharyngeal- Thin Straw Reduced anterior laryngeal mobility;Reduced tongue base retraction;Penetration/Aspiration before swallow Pharyngeal Material enters airway, passes BELOW cords without attempt by patient to eject out (silent aspiration) Pharyngeal- Puree Reduced anterior laryngeal mobility;Reduced tongue base retraction;Pharyngeal residue - valleculae Pharyngeal -- Pharyngeal- Mechanical Soft Reduced anterior laryngeal mobility;Reduced tongue base retraction;Pharyngeal residue - valleculae Pharyngeal -- Pharyngeal- Regular -- Pharyngeal -- Pharyngeal- Multi-consistency -- Pharyngeal -- Pharyngeal- Pill -- Pharyngeal -- Pharyngeal Comment --  CHL IP CERVICAL ESOPHAGEAL PHASE 02/28/2020 Cervical Esophageal Phase WFL Pudding Teaspoon -- Pudding Cup -- Honey Teaspoon -- Honey Cup -- Nectar Teaspoon -- Nectar Cup -- Nectar Straw -- Thin  Teaspoon -- Thin Cup -- Thin Straw -- Puree -- Mechanical Soft -- Regular -- Multi-consistency -- Pill -- Cervical Esophageal Comment -- Osie Bond., M.A. [facility] Pager [phone] Office [phone] 02/28/2020, 9:38 AM              Scheduled Meds: . amLODipine  10 mg Oral Daily  . aspirin  81 mg Oral Daily  . atorvastatin  80 mg Oral Daily  . Chlorhexidine Gluconate Cloth  6 each Topical Daily  . dexamethasone (DECADRON) injection  18 mg Intravenous Daily  . dextrose  10 mL Intravenous Q24H  . dextrose  10 mL Intravenous Q24H  . docusate  100 mg Oral BID  . ethambutol  1,200 mg Oral Daily  . fenofibrate  160 mg Oral Daily  . heparin  5,000 Units Subcutaneous Q8H  . insulin aspart  0-9 Units Subcutaneous Q4H  . isoniazid  300 mg Oral Daily  . levETIRAcetam  1,000 mg Oral Q12H  . metoprolol succinate  50 mg Oral Daily  . multivitamin with minerals  1 tablet Oral Daily  . pantoprazole sodium  40 mg Oral Daily  . polyethylene glycol  17 g Oral Daily  . potassium chloride  40 mEq  Oral Daily  . pyrazinamide  1,500 mg Oral Daily  . vitamin B-6  50 mg Oral Daily  . sodium chloride  500 mL Intravenous Q24H  . vitamin B-12  1,000 mcg Oral Daily   Continuous Infusions: . sodium chloride Stopped (02/24/20 0116)  . sodium chloride Stopped (02/24/20 0218)  . amphotericin  B  Liposome (AMBISOME) ADULT IV Stopped (02/29/20 0046)  . rifampin (RIFADIN) IVPB 600 mg (02/28/20 1023)  . sodium chloride Stopped (02/29/20 0151)    LOS: 35 days   Little Ishikawa, DO [facility] Pager: secure chat on epic  If 7PM-7AM, please contact night-coverage [email or web address]

## 2020-03-01 DIAGNOSIS — G40901 Epilepsy, unspecified, not intractable, with status epilepticus: Secondary | ICD-10-CM | POA: Diagnosis not present

## 2020-03-01 DIAGNOSIS — R569 Unspecified convulsions: Secondary | ICD-10-CM | POA: Diagnosis not present

## 2020-03-01 DIAGNOSIS — R634 Abnormal weight loss: Secondary | ICD-10-CM | POA: Diagnosis not present

## 2020-03-01 DIAGNOSIS — G030 Nonpyogenic meningitis: Secondary | ICD-10-CM | POA: Diagnosis not present

## 2020-03-01 LAB — GLUCOSE, CAPILLARY
Glucose-Capillary: 114 mg/dL — ABNORMAL HIGH (ref 70–99)
Glucose-Capillary: 114 mg/dL — ABNORMAL HIGH (ref 70–99)
Glucose-Capillary: 121 mg/dL — ABNORMAL HIGH (ref 70–99)
Glucose-Capillary: 127 mg/dL — ABNORMAL HIGH (ref 70–99)
Glucose-Capillary: 32 mg/dL — CL (ref 70–99)
Glucose-Capillary: 40 mg/dL — CL (ref 70–99)
Glucose-Capillary: 48 mg/dL — ABNORMAL LOW (ref 70–99)
Glucose-Capillary: 68 mg/dL — ABNORMAL LOW (ref 70–99)
Glucose-Capillary: 72 mg/dL (ref 70–99)

## 2020-03-01 LAB — BASIC METABOLIC PANEL
Anion gap: 12 (ref 5–15)
BUN: 34 mg/dL — ABNORMAL HIGH (ref 8–23)
CO2: 17 mmol/L — ABNORMAL LOW (ref 22–32)
Calcium: 9.1 mg/dL (ref 8.9–10.3)
Chloride: 103 mmol/L (ref 98–111)
Creatinine, Ser: 1.4 mg/dL — ABNORMAL HIGH (ref 0.61–1.24)
GFR, Estimated: 53 mL/min — ABNORMAL LOW (ref >60)
Glucose, Bld: 60 mg/dL — ABNORMAL LOW (ref 70–99)
Potassium: 4.2 mmol/L (ref 3.5–5.1)
Sodium: 132 mmol/L — ABNORMAL LOW (ref 135–145)

## 2020-03-01 LAB — MAGNESIUM: Magnesium: 1.9 mg/dL (ref 1.7–2.4)

## 2020-03-01 MED ORDER — DEXTROSE 10 % IV SOLN: INTRAVENOUS | Status: AC

## 2020-03-01 MED ORDER — DEXTROSE 50 % IV SOLN
INTRAVENOUS | Status: AC
  Administered 2020-03-01: 50 mL
  Filled 2020-03-01: qty 50

## 2020-03-01 MED ORDER — MAGNESIUM SULFATE 2 GM/50ML IV SOLN
2.0000 g | Freq: Once | INTRAVENOUS | Status: AC
  Administered 2020-03-01: 2 g via INTRAVENOUS
  Filled 2020-03-01: qty 50

## 2020-03-01 NOTE — Progress Notes (Signed)
Pharmacy Antibiotic Note  Howard Allen is a 74 y.o. 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 Thus far, no organism has been identified as a cause of infection.   ID plan for LP in the future- Plavix discontinued.  CSF culture from 10/15 now with Candida albicans- sensitivities sent to [facility] (pending).  CSF studies for Coccidioides, Blastomyces, Histoplasma, and paraneoplastic panel pending.    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 Current SCr is near baseline at 1.40 (BL ~1.2-1.4). Will provide pre and post hydration of amphotericin B with 750 mL NS. Continue to watch renal function and further adjust NS if Scr continues to increase.   Potassium remains stable (4.2 today) and patient is currently on a daily potassium supplement. Mg today is 1.9- will supplement with 2g today.  AST/ALT are wnl - will continue to monitor while on RIPE therapy.   Plan: Amphotericin B 310 mg (~ 5 mg/kg) every 24 hours  Give 750 mL of normal saline 1 hour prior to dose Flush line with dextrose in-between normal saline and amphotericin B doses Give another 750 mL normal saline after amphotericin B infusion is complete  Administration instructions reviewed with RN Daily K supplement Supplement Mg as needed F/u daily Scr, K, and Mg levels  Rifampin 600 mg daily Isoniazid 300 mg daily Pyridoxine 50 mg daily Pyrazinamide 1500 mg daily Ethambutol 1200 mg daily Dexamethasone IV 0.3 mg/kg (~18 mg) daily for 2 weeks- Will taper as appropriate pending results of CSF   Height: 5\' 10"  (177.8 cm) Weight: 63.8 kg (140 lb 10.5 oz) IBW/kg (Calculated) : 73  Temp (24hrs), Avg:97.5 F (36.4 C), Min:97.4 F (36.3 C), Max:97.6 F (36.4 C)  Recent Labs  Lab 02/26/20 0240 02/27/20 0501 02/28/20 0528 02/29/20 0824 03/01/20 0211  CREATININE 1.35* 1.30* 1.36* 1.42* 1.40*    Estimated Creatinine Clearance: 43 mL/min (A)  (by C-G formula based on SCr of 1.4 mg/dL (H)).    Allergies  Allergen Reactions  . Lisinopril Anaphylaxis, Cough and Other (See Comments)    Other reaction(s): anaphylaxis Pt states it made him "cough his head off"   . Lorazepam     Other reaction(s): Hallucinations     Dimple Nanas, PharmD PGY-1 Acute Care Pharmacy Resident Office: [phone] 03/01/2020 9:36 AM

## 2020-03-01 NOTE — Progress Notes (Deleted)
Pt given discharge instruction, no belongings to return. Pt denies questions at this time. IV removed Wife at bedside.

## 2020-03-01 NOTE — Progress Notes (Signed)
Hypoglycemic Event  CBG: 48  Treatment: oral glucerna  Symptoms: None  Follow-up CBG: Time:0544 CBG Result32  Possible Reasons for Event: sure, gave 1unit of insulin at 2357  Comments/MD notified Dr. Vedia Pereyra Olen Pel

## 2020-03-01 NOTE — Progress Notes (Signed)
PROGRESS NOTE    Howard Allen  BJY:[id number] DOB: January 29, 1946 DOA: 02/13/2020 PCP: System, Provider Not In   Brief Narrative: The patient is a 74 year old Caucasian male with a past medical history significant for Crohn's disease, hypertension, hyperlipidemia, depression history of hospitalization in June 2021 for pneumatosis status post ileocolonic resection and lysis of adhesions who presented with multiple tonic-clonic seizures complicated by status epilepticus that required intubation and loading with Keppra. CT of the head was negative and initially when he was brought in to the ED by EMS there was at least 3 witnessed generalized tonic-clonic seizures noted. PCCM was consulted for admission given that he was intubated on arrival and unresponsive. He was intubated on 02/13/2020 and extubated 02/15/2020. Neurology was consulted and he underwent further work-up and was found to have a signal abnormality in the left limb is most consistent with an acute infarct. Echocardiogram done showed an EF of 55 to 60% with moderate LVH and grade 1 diastolic dysfunction. EEG testing on 10/9 was negative. 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 He underwent several viral serologies and had a lumbar puncture sent on 02/14/2020. He was started on IV vancomycin, IV ceftriaxone, IV ampicillin and IV acyclovir for suspected meningitis. CSF analysis was consistent with aseptic meningitis with high white blood cells and very low glucose level. CSF culture was initally negative. He was transferred to [facility] service on 02/19/2020 and remained confused. Subsequently ID was consulted and they were suspecting aseptic meningoencephalitis and recommended large volume LP. Patient was already on aspirin and Plavix for past 5 days. His Plavix was stopped on 02/20/2020. P2 Y 12 was checked and based on that, neurology performed another large-volume LP on 02/22/2020 and several labs were sent by ID. 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  Assessment & Plan:   Principal Problem:   Aseptic meningitis Active Problems:   Seizure ([place])   Weight loss   Encounter for nasogastric (NG) tube placement   Malnutrition of moderate degree   Aseptic meningitis - LP performed on 10/7 consistent with meningitis - LP Cultures positive for candida as of 02/27/20 - already on amphotericin B - ID following - appreciate insight and recommendations - VDLR and cryptococcal antigen negative, and [place]ile serology pending - Quantiferon gold was indeterminate.  Patient was on vancomycin, ceftriaxone and acyclovir. Consulted ID on 02/20/2020.  Dr. Tommy Medal recommended repeat large-volume LP to send more labs. Rocephin and vancomycin discontinued but acyclovir continues.  Had a long multidisciplinary discussion between me, Dr. Tommy Medal and Dr. Hortense Ramal of neurology on 02/21/2020.  - Patient finally underwent large-volume LP by neurology on 02/22/2020. 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   - Plavix on hold as ID plans to do third LP - will wait until remainder of labs result per ID before moving forward with repeat LP  - Per ID: currently waiting for fungal antigen/serologies back from CSF for hopeful diagnosis OR w negative assays  - will repeat LP to look again for carcinomatous meningitis - stay on abx to verify if CSF appearance changes on repeat testing.  Acute ischemic infarct  - Seen on MRI imaging on 10/7: left thalamic signal abnormality measuring 2.3 cm most consistent with acute infarct; no edema or  hemorrhagic conversion. (neuro suspects 2/2 prolonged seizure) - Will need outpt MRI in 8-12 weeks to help delineate this -outpt neuro f/u as well - CTA head and neck without large  vessel occlusion or proximal stenosis, small ICA aneurysm 2 mm, right upper pulmonary nodule 5 mm, recommend outpatient follow-up imaging per protocol. - Now on atorvastatin 80 mg despite that he takes 20 at home given his acute CVA - TTE negative for vegetations or thrombosis.  - Continue high intensity statin; Lipid panel done and showed a total cholesterol/HDL ratio 3.3, cholesterol level 154, HDL level 46, LDL 79, triglycerides 146, VLDL 29 - PT/OT recommending CIR - appreciate neurology evaluation/recommendations.  Currently on aspirin, Plavix on hold for possible repeat LP - see above  Tonic-Clonic Seizures Status Epilepticus:  - Resolved - Secondary to meningitis - Neurology on board; appreciate their recommendations - Continue Keppra  Large hematoma above right eye, resolving  - He developed large hematoma on the morning of 02/22/2020 - Initially he was unable to open his eye initially - ENT consulted and he was evaluated by them but they did not feel the need to evacuate hematoma, now resolving - Patient's hematoma is decreasing in size and he is able to open his eye without issues  Hypomagnesemia - Resolved  Hyperphosphatemia - Follow PTH, CPK levels - Previously low in the setting of malnutrition/refeeding syndrome - Likely compounded by AKI as below  B12 Deficiency  Moderate Malnutrition - Likely due to malnutrition and anorexia, possibly worsened by malabsorption given bowel inflammation seen on imaging the past several months prior to resection. Of note, 70 lb weight loss in the past 10 months - Consult to dietician for tube feed initiation, he was started on tube feedings which was discontinued now that he is tolerating dysphagia 2 diet. Nutritionist now recommending supplemental shake twice daily as well as Magic cup twice daily with meals and multivitamin with minerals daily. -Discontinue all insulin given hypoglycemia in the last 2 mornings, likely in setting of poor  p.o. intake as well, encourage increased p.o. intake daily, family also educated to continue to press importance of diet on the patient.  HTN/HLD - Home medications include Toprol 100 mg, Amlodipine 5 mg, Lipitor 20 mg, Fenofibrate 160 mg.  Blood pressure very well controlled.  Will continue amlodipine to 10 mg and Toprol-XL 100 mg p.o. daily.   Normocytic Anemia  Hx of Iron Deficiency Anemia B12 Deficiency  - B12 levels low. Iron panel shows low saturation, however normal iron and TIBC, unclear picture as not consistent completely with iron deficiency anemia or anemia of chronic disease.  Hemoglobin is stable.  Questionable Crohn's Disease s/p Ileocolic Resection  - No longer on immunosuppressive therapy. - Takes budesonide as an outpatient -currently on hold  AKI on CKD stage II, stable - Slight jump in creatinine.   - Likely due to dehydration, DC fluids and increase PO intake as appropriate  Lab Results  Component Value Date   CREATININE 1.40 (H) 03/01/2020    DVT prophylaxis: SCDs Heparin 5000 units subcu every 8 hours Code Status: FULL CODE  Family Communication: Daughter Melanie updated Disposition Plan: Plan to discharge to CIR once cleared by ID and neurology.  Status is: Inpatient  Remains inpatient appropriate because:Altered mental status, Unsafe d/c plan, IV treatments appropriate due to intensity of illness or inability to take PO and Inpatient level of care appropriate due to severity of illness  Dispo: The patient is from: Home  Anticipated d/c is to: CIR              Anticipated d/c date is: >4 days              Patient currently is not medically stable to d/c.  Consultants:   Neurology  Infectious disease  PCCM Transfer   Procedures:  10/6 >> ETT placed 10/7 >> LP 10/8 >> Extubated   Significant Diagnostic Tests:  10/6 CT Head >> No acute intracranial abnormalities; age-related volume loss and chronic small vessel disease 10/7 MRI  brain: 1. Signal abnormality in the left thalamus most consistent with an acute infarct. 2. Signal abnormality in the mesial left temporal lobe and splenium of the corpus callosum which may reflect acute infarct, sequelae of recent seizure activity, or encephalitis (including herpes encephalitis). No contralateral cerebral involvement. 3. Mild chronic small vessel ischemic disease. 10/8 echo: LVEF 55-60%, mod LVH, Grade I diastolic dysfunction 87/8 CT Head >> Progressive low density in the left thalamus related to acute infarction; no hemorrhage 10/9 EEG >> Negative  Micro Data:  10/6 COVID-19 + influenza >> Negative  10/6 Blood Cultures >> NGTD 10/7 CSF Culture >> NGTD 10/7 HSV serology >> 2.02 on igg testing. Will send pcr to determine past or active.  10/7 West Nile serology >>  10/7 VRDL serology >> 10/8 Quanitferon Gold  >> indeterminant 10/9 acid fast csf: Pending 10/11 HSV PCR: pending  10/15 -CSF culture -Candida albicans  Antimicrobials:  Anti-infectives (From admission, onward)   Start     Dose/Rate Route Frequency Ordered Stop   02/26/20 1115  rifampin (RIFADIN) 600 mg in sodium chloride 0.9 % 100 mL IVPB        600 mg 200 mL/hr over 30 Minutes Intravenous Every 24 hours 02/26/20 1026     02/23/20 0100  acyclovir (ZOVIRAX) 615 mg in dextrose 5 % 100 mL IVPB  Status:  Discontinued        10 mg/kg  61.3 kg 112.3 mL/hr over 60 Minutes Intravenous Every 12 hours 02/22/20 1537 02/24/20 0936   02/22/20 2100  amphotericin B liposome (AMBISOME) 310 mg in dextrose 5 % 500 mL IVPB        5 mg/kg  61.3 kg 288.8 mL/hr over 120 Minutes Intravenous Every 24 hours 02/22/20 1524     02/22/20 1800  rifampin (RIFADIN) capsule 600 mg  Status:  Discontinued        600 mg Oral Daily 02/22/20 1520 02/26/20 1026   02/22/20 1800  isoniazid (NYDRAZID) tablet 300 mg        300 mg Oral Daily 02/22/20 1520     02/22/20 1800  pyrazinamide tablet 1,500 mg        1,500 mg Oral Daily 02/22/20  1520     02/22/20 1800  ethambutol (MYAMBUTOL) tablet 1,200 mg        1,200 mg Oral Daily 02/22/20 1520     02/20/20 1000  vancomycin (VANCOCIN) IVPB 1000 mg/200 mL premix  Status:  Discontinued        1,000 mg 200 mL/hr over 60 Minutes Intravenous Every 24 hours 02/20/20 0555 02/21/20 1259   02/19/20 1811  vancomycin variable dose per unstable renal function (pharmacist dosing)  Status:  Discontinued         Does not apply See admin instructions 02/19/20 1811 02/20/20 0555   02/18/20 0500  vancomycin (VANCOREADY) IVPB 750 mg/150 mL  Status:  Discontinued        750 mg 150 mL/hr  over 60 Minutes Intravenous Every 12 hours 02/17/20 1651 02/19/20 1811   02/15/20 0300  vancomycin (VANCOREADY) IVPB 500 mg/100 mL        500 mg 100 mL/hr over 60 Minutes Intravenous Every 12 hours 02/14/20 1352 02/17/20 1755   02/14/20 1400  acyclovir (ZOVIRAX) 690 mg in dextrose 5 % 100 mL IVPB  Status:  Discontinued        10 mg/kg  68.9 kg 113.8 mL/hr over 60 Minutes Intravenous Every 12 hours 02/14/20 1245 02/22/20 1537   02/14/20 1400  vancomycin (VANCOREADY) IVPB 1250 mg/250 mL        1,250 mg 166.7 mL/hr over 90 Minutes Intravenous STAT 02/14/20 1352 02/14/20 1655   02/14/20 1300  cefTRIAXone (ROCEPHIN) 2 g in sodium chloride 0.9 % 100 mL IVPB  Status:  Discontinued        2 g 200 mL/hr over 30 Minutes Intravenous Every 12 hours 02/14/20 1245 02/20/20 1732   02/14/20 1300  ampicillin (OMNIPEN) 2 g in sodium chloride 0.9 % 100 mL IVPB  Status:  Discontinued        2 g 300 mL/hr over 20 Minutes Intravenous Every 6 hours 02/14/20 1245 02/18/20 1051       Subjective: No acute issues or events overnight, patient awake alert oriented to person and general situation, correctly recalls his home city state, family members but has difficulty orienting to place and time.  Answers most questions with "I do not know" otherwise.  Review of systems somewhat limited.  Objective: Vitals:   02/29/20 1609 02/29/20  1949 03/01/20 0406 03/01/20 0645  BP: 134/90 121/74 (!) 149/84   Pulse: 63 (!) 44 (!) 48   Resp: 18 18 16    Temp: (!) 97.4 F (36.3 C) (!) 97.5 F (36.4 C) (!) 97.5 F (36.4 C)   TempSrc: Oral Oral Oral   SpO2: 99% 100% 100%   Weight:    63.8 kg  Height:        Intake/Output Summary (Last 24 hours) at 03/01/2020 0744 Last data filed at 03/01/2020 0645 Gross per 24 hour  Intake 959.87 ml  Output 200 ml  Net 759.87 ml   Filed Weights   02/28/20 0426 02/29/20 0500 03/01/20 0645  Weight: 63 kg 63.2 kg 63.8 kg   Examination:  General:  Pleasantly resting in bed, No acute distress.  Alert to person only HEENT: Large ecchymosis and hematoma over right lateral eye.  Sclerae nonicteric, noninjected.  Extraocular movements intact bilaterally. Neck:  Without mass or deformity.  Trachea is midline. Lungs:  Clear to auscultate bilaterally without rhonchi, wheeze, or rales. Heart:  Regular rate and rhythm.  Without murmurs, rubs, or gallops. Abdomen:  Soft, nontender, nondistended.  Without guarding or rebound. Extremities: Without cyanosis, clubbing, edema, or obvious deformity. Vascular:  Dorsalis pedis and posterior tibial pulses palpable bilaterally. Skin:  Warm and dry, no erythema, no ulcerations.   Data Reviewed: I have personally reviewed following labs and imaging studies  CBC: No results for input(s): WBC, NEUTROABS, HGB, HCT, MCV, PLT in the last 168 hours. Basic Metabolic Panel: Recent Labs  Lab 02/24/20 0256 02/24/20 0256 02/25/20 0216 02/25/20 0216 02/26/20 0240 02/26/20 0240 02/26/20 0803 02/27/20 0501 02/28/20 0528 02/28/20 1413 02/29/20 0824 03/01/20 0211  NA 132*   < > 132*   < > 130*  --   --  132* 130*  --  131* 132*  K 4.3   < > 4.1   < > 3.9  --   --  4.9 4.0  --  4.4 4.2  CL 104   < > 106   < > 100  --   --  105 102  --  99 103  CO2 20*   < > 17*   < > 19*  --   --  16* 18*  --  20* 17*  GLUCOSE 83   < > 100*   < > 88  --   --  99 134*  --  97 60*   BUN 23   < > 25*   < > 30*  --   --  33* 33*  --  32* 34*  CREATININE 1.21   < > 1.23   < > 1.35*  --   --  1.30* 1.36*  --  1.42* 1.40*  CALCIUM 8.8*   < > 9.1   < > 9.3   < >  --  9.4 9.3 9.8 9.5 9.1  MG 1.8   < > 1.7   < >  --   --  2.4 2.0 1.8  --  2.0 1.9  PHOS 3.2  --  3.7  --  5.8*  --   --  6.0* 6.6*  --   --   --    < > = values in this interval not displayed.   GFR: Estimated Creatinine Clearance: 43 mL/min (A) (by C-G formula based on SCr of 1.4 mg/dL (H)). Liver Function Tests: Recent Labs  Lab 02/29/20 0824  AST 62*  ALT 33  ALKPHOS 46  BILITOT 0.7  PROT 5.9*  ALBUMIN 3.3*   No results for input(s): LIPASE, AMYLASE in the last 168 hours. No results for input(s): AMMONIA in the last 168 hours. Coagulation Profile: No results for input(s): INR, PROTIME in the last 168 hours. Cardiac Enzymes: Recent Labs  Lab 02/28/20 1413  CKTOTAL 22*   BNP (last 3 results) No results for input(s): PROBNP in the last 8760 hours. HbA1C: No results for input(s): HGBA1C in the last 72 hours. CBG: Recent Labs  Lab 02/29/20 1952 02/29/20 2349 03/01/20 0402 03/01/20 0514 03/01/20 0544  GLUCAP 126* 127* 68* 48* 32*   Lipid Profile: No results for input(s): CHOL, HDL, LDLCALC, TRIG, CHOLHDL, LDLDIRECT in the last 72 hours. Thyroid Function Tests: No results for input(s): TSH, T4TOTAL, FREET4, T3FREE, THYROIDAB in the last 72 hours. Anemia Panel: No results for input(s): VITAMINB12, FOLATE, FERRITIN, TIBC, IRON, RETICCTPCT in the last 72 hours. Sepsis Labs: No results for input(s): PROCALCITON, LATICACIDVEN in the last 168 hours.  Recent Results (from the past 240 hour(s))  Culture, fungus without smear     Status: Abnormal (Preliminary result)   Collection Time: 02/22/20 11:38 AM   Specimen: CSF; Other  Result Value Ref Range Status   Specimen Description CSF TUBE 2  Final   Special Requests Immunocompromised  Final   Culture (A)  Final    [facility] CRITICAL  RESULT CALLED TO, READ BACK BY AND VERIFIED WITH: DR Drucilla Schmidt @0808  02/27/20 EB Sent to [facility] for further susceptibility testing. Performed at [facility], [place] [address], [place], [place] [address]    Report Status PENDING  Incomplete  Acid Fast Smear (AFB)     Status: None   Collection Time: 02/22/20 11:38 AM   Specimen: CSF; Cerebrospinal Fluid  Result Value Ref Range Status   AFB Specimen Processing Concentration  Final   Acid Fast Smear Negative  Final    Comment: (NOTE) Performed At: [facility]  [facility] [place], [place] [id number] Rush Farmer MD [id number]    Source (AFB) CSF  Final    Comment: TUBE 2 Performed at [facility], [place] [address] [place], [place] [address]   MTB RIF NAA Non-Sputum, w/o Culture     Status: None   Collection Time: 02/22/20 11:38 AM  Result Value Ref Range Status   Source CSF  Final    Comment: Performed at [facility], [place] [address], [place], [place] [address]   Specimen Source Comment  Final    Comment: Cerebrospinal fluid (CSF)   M tuberculosis complex Comment NOT DETECTED Final    Comment: (NOTE) Mycobacterium tuberculosis complex (MTBC) NOT detected. This test was developed and its performance characteristics determined by [facility]. It has not been cleared or approved by the Food and Drug Administration. Per the College of American Pathologists (CAP) guidelines, a culture must be performed on all samples regardless of the molecular test result. By ordering this test code, the client has assumed responsibility for the performance of the culture.    Rifampin Comment Susceptible Final    Comment: (NOTE) Because Mycobacterium tuberculosis complex (MTBC) was not detected, no rifampin determination is possible. This test was developed and its performance characteristics determined by [facility]. It has not been cleared or approved by the Food and Drug Administration.    AFB Specimen Processing  Concentration  Final    Comment: (NOTE) Performed At: [facility] [place], [place] [id number] Rush Farmer MD [id number]      RN Pressure Injury Documentation:     Estimated body mass index is 20.18 kg/m as calculated from the following:   Height as of this encounter: 5\' 10"  (1.778 m).   Weight as of this encounter: 63.8 kg.  Malnutrition Type:  Nutrition Problem: Moderate Malnutrition Etiology: chronic illness   Malnutrition Characteristics:  Signs/Symptoms: mild fat depletion, moderate fat depletion, mild muscle depletion, moderate muscle depletion   Nutrition Interventions:  Interventions: Hormel Shake, Magic cup, MVI   Radiology Studies: DG Swallowing Func-Speech Pathology  Result Date: 02/28/2020 Objective Swallowing Evaluation: Type of Study: MBS-Modified Barium Swallow Study  Patient Details Name: Bereket Gernert MRN: [id number] Date of Birth: 1945-11-17 Today's Date: 02/28/2020 Time: SLP Start Time (ACUTE ONLY): 0825 -SLP Stop Time (ACUTE ONLY): 0845 SLP Time Calculation (min) (ACUTE ONLY): 20 min Past Medical History: Past Medical History: Diagnosis Date . Aseptic meningitis 02/21/2020 . Weight loss 02/21/2020 Past Surgical History:  The histories are not reviewed yet. Please review them in the "History" navigator section and refresh this [place]. HPI: 74 y.o. 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   Subjective: alert and following commands well Assessment / Plan / Recommendation CHL IP CLINICAL IMPRESSIONS 02/28/2020 Clinical Impression Pt demonstrates improvements since initial MBS, including improved mentation for  command following to attempt strategies as well as improved oral control and timing. He has no oral loss this time, and pt self-manages oral residue with solids by using multiple swallows. Lingual pumping is noted that mildly reduces efficiency, but is still functional. Thin liquids still enter the airway before the swallow given the above combined with reduced coordination for airway closure. Aspiration is  in small amounts, as is penetration to the vocal folds, but it is still silent. A cued throat clear helps to clear the airway. A chin tuck improves airway protection by better containing thin liquids within the valleculae before swallowing, but he has a little trouble with maintaining a full chin tuck throughout the duration of the swallow. When he does not stay in a full chin tuck, penetration/aspiration still occur. Recommend advancing diet to Dys 3 solids and nectar thick liquids with good potential to advance to thin liquids as he can more consistently use a chin tuck.  SLP Visit Diagnosis Dysphagia, oropharyngeal phase (R13.12) Attention and concentration deficit following -- Frontal lobe and executive function deficit following -- Impact on safety and function Mild aspiration risk;Moderate aspiration risk   CHL IP TREATMENT RECOMMENDATION 02/28/2020 Treatment Recommendations Therapy as outlined in treatment plan below   Prognosis 02/28/2020 Prognosis for Safe Diet Advancement Good Barriers to Reach Goals Cognitive deficits Barriers/Prognosis Comment -- CHL IP DIET RECOMMENDATION 02/28/2020 SLP Diet Recommendations Dysphagia 3 (Mech soft) solids;Nectar thick liquid Liquid Administration via Cup;Straw Medication Administration Crushed with puree Compensations Minimize environmental distractions;Slow rate;Small sips/bites Postural Changes Seated upright at 90 degrees   CHL IP OTHER RECOMMENDATIONS 02/28/2020 Recommended Consults -- Oral Care Recommendations Oral care BID Other Recommendations --   CHL IP  FOLLOW UP RECOMMENDATIONS 02/28/2020 Follow up Recommendations Skilled Nursing facility   [facility] IP FREQUENCY AND DURATION 02/28/2020 Speech Therapy Frequency (ACUTE ONLY) min 2x/week Treatment Duration 2 weeks      CHL IP ORAL PHASE 02/28/2020 Oral Phase Impaired Oral - Pudding Teaspoon -- Oral - Pudding Cup -- Oral - Honey Teaspoon -- Oral - Honey Cup NT Oral - Nectar Teaspoon -- Oral - Nectar Cup Lingual pumping;Delayed oral transit Oral - Nectar Straw Lingual pumping;Delayed oral transit Oral - Thin Teaspoon -- Oral - Thin Cup Premature spillage Oral - Thin Straw Premature spillage Oral - Puree Lingual pumping;Delayed oral transit Oral - Mech Soft Lingual pumping;Delayed oral transit;Lingual/palatal residue Oral - Regular -- Oral - Multi-Consistency -- Oral - Pill Lingual pumping;Delayed oral transit;Lingual/palatal residue;Other (Comment) Oral Phase - Comment --  CHL IP PHARYNGEAL PHASE 02/28/2020 Pharyngeal Phase Impaired Pharyngeal- Pudding Teaspoon -- Pharyngeal -- Pharyngeal- Pudding Cup -- Pharyngeal -- Pharyngeal- Honey Teaspoon -- Pharyngeal -- Pharyngeal- Honey Cup NT Pharyngeal -- Pharyngeal- Nectar Teaspoon -- Pharyngeal -- Pharyngeal- Nectar Cup Reduced anterior laryngeal mobility;Reduced tongue base retraction Pharyngeal Material does not enter airway Pharyngeal- Nectar Straw Reduced anterior laryngeal mobility;Reduced tongue base retraction Pharyngeal Material does not enter airway Pharyngeal- Thin Teaspoon -- Pharyngeal -- Pharyngeal- Thin Cup Reduced anterior laryngeal mobility;Reduced tongue base retraction;Penetration/Aspiration before swallow Pharyngeal Material enters airway, passes BELOW cords without attempt by patient to eject out (silent aspiration) Pharyngeal- Thin Straw Reduced anterior laryngeal mobility;Reduced tongue base retraction;Penetration/Aspiration before swallow Pharyngeal Material enters airway, passes BELOW cords without attempt by patient to eject out (silent aspiration)  Pharyngeal- Puree Reduced anterior laryngeal mobility;Reduced tongue base retraction;Pharyngeal residue - valleculae Pharyngeal -- Pharyngeal- Mechanical Soft Reduced anterior laryngeal mobility;Reduced tongue base retraction;Pharyngeal residue - valleculae Pharyngeal -- Pharyngeal- Regular -- Pharyngeal -- Pharyngeal- Multi-consistency -- Pharyngeal -- Pharyngeal- Pill -- Pharyngeal -- Pharyngeal Comment --  CHL IP CERVICAL ESOPHAGEAL PHASE 02/28/2020 Cervical Esophageal Phase WFL Pudding Teaspoon -- Pudding Cup -- Honey Teaspoon -- Honey Cup -- Nectar Teaspoon -- Nectar Cup -- Nectar Straw -- Thin Teaspoon -- Thin Cup -- Thin Straw -- Puree -- Mechanical Soft -- Regular -- Multi-consistency -- Pill -- Cervical Esophageal  Comment -- Osie Bond., M.A. [facility] Pager [phone] Office [phone] 02/28/2020, 9:38 AM              Scheduled Meds: . amLODipine  10 mg Oral Daily  . aspirin  81 mg Oral Daily  . atorvastatin  80 mg Oral Daily  . Chlorhexidine Gluconate Cloth  6 each Topical Daily  . dexamethasone (DECADRON) injection  18 mg Intravenous Daily  . dextrose  10 mL Intravenous Q24H  . dextrose  10 mL Intravenous Q24H  . docusate  100 mg Oral BID  . ethambutol  1,200 mg Oral Daily  . fenofibrate  160 mg Oral Daily  . heparin  5,000 Units Subcutaneous Q8H  . insulin aspart  0-9 Units Subcutaneous Q4H  . isoniazid  300 mg Oral Daily  . levETIRAcetam  1,000 mg Oral Q12H  . metoprolol succinate  50 mg Oral Daily  . multivitamin with minerals  1 tablet Oral Daily  . pantoprazole sodium  40 mg Oral Daily  . polyethylene glycol  17 g Oral Daily  . potassium chloride  40 mEq Oral Daily  . pyrazinamide  1,500 mg Oral Daily  . vitamin B-6  50 mg Oral Daily  . sodium chloride  750 mL Intravenous Q24H  . vitamin B-12  1,000 mcg Oral Daily   Continuous Infusions: . sodium chloride Stopped (02/24/20 0116)  . sodium chloride Stopped (02/24/20 0218)  . amphotericin  B   Liposome (AMBISOME) ADULT IV Stopped (02/29/20 2348)  . dextrose 75 mL/hr at 03/01/20 0616  . rifampin (RIFADIN) IVPB Stopped (02/29/20 1246)  . sodium chloride Stopped (03/01/20 0107)    LOS: 17 days   Little Ishikawa, DO [facility] Pager: secure chat on epic  If 7PM-7AM, please contact night-coverage [email or web address]

## 2020-03-02 ENCOUNTER — Encounter (HOSPITAL_COMMUNITY): Payer: Self-pay | Admitting: Internal Medicine

## 2020-03-02 ENCOUNTER — Other Ambulatory Visit: Payer: Self-pay

## 2020-03-02 DIAGNOSIS — R569 Unspecified convulsions: Secondary | ICD-10-CM | POA: Diagnosis not present

## 2020-03-02 DIAGNOSIS — G40901 Epilepsy, unspecified, not intractable, with status epilepticus: Secondary | ICD-10-CM | POA: Diagnosis not present

## 2020-03-02 DIAGNOSIS — R634 Abnormal weight loss: Secondary | ICD-10-CM | POA: Diagnosis not present

## 2020-03-02 DIAGNOSIS — G030 Nonpyogenic meningitis: Secondary | ICD-10-CM | POA: Diagnosis not present

## 2020-03-02 LAB — MAGNESIUM: Magnesium: 1.9 mg/dL (ref 1.7–2.4)

## 2020-03-02 LAB — GLUCOSE, CAPILLARY
Glucose-Capillary: 82 mg/dL (ref 70–99)
Glucose-Capillary: 27 mg/dL — CL (ref 70–99)
Glucose-Capillary: 29 mg/dL — CL (ref 70–99)
Glucose-Capillary: 83 mg/dL (ref 70–99)
Glucose-Capillary: 104 mg/dL — ABNORMAL HIGH (ref 70–99)
Glucose-Capillary: 126 mg/dL — ABNORMAL HIGH (ref 70–99)
Glucose-Capillary: 122 mg/dL — ABNORMAL HIGH (ref 70–99)
Glucose-Capillary: 99 mg/dL (ref 70–99)

## 2020-03-02 LAB — BASIC METABOLIC PANEL
Anion gap: 8 (ref 5–15)
BUN: 28 mg/dL — ABNORMAL HIGH (ref 8–23)
CO2: 19 mmol/L — ABNORMAL LOW (ref 22–32)
Calcium: 8.9 mg/dL (ref 8.9–10.3)
Chloride: 103 mmol/L (ref 98–111)
Creatinine, Ser: 1.44 mg/dL — ABNORMAL HIGH (ref 0.61–1.24)
GFR, Estimated: 52 mL/min — ABNORMAL LOW (ref >60)
Glucose, Bld: 95 mg/dL (ref 70–99)
Potassium: 4.3 mmol/L (ref 3.5–5.1)
Sodium: 130 mmol/L — ABNORMAL LOW (ref 135–145)

## 2020-03-02 LAB — PHOSPHORUS: Phosphorus: 6.6 mg/dL — ABNORMAL HIGH (ref 2.5–4.6)

## 2020-03-02 MED ORDER — MAGNESIUM SULFATE 2 GM/50ML IV SOLN
2.0000 g | Freq: Once | INTRAVENOUS | Status: AC
  Administered 2020-03-02: 2 g via INTRAVENOUS
  Filled 2020-03-02: qty 50

## 2020-03-02 MED ORDER — DEXTROSE 10 % IV SOLN: INTRAVENOUS | Status: DC

## 2020-03-02 MED ORDER — DEXTROSE 50 % IV SOLN
INTRAVENOUS | Status: AC
  Administered 2020-03-02: 50 mL
  Filled 2020-03-02: qty 50

## 2020-03-02 MED ORDER — DEXTROSE IN LACTATED RINGERS 5 % IV SOLN: INTRAVENOUS | Status: DC

## 2020-03-02 NOTE — Progress Notes (Signed)
Pharmacy Antibiotic Note  Howard Allen is a 74 y.o. 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 Thus far, no organism has been identified as a cause of infection.   ID plan for LP in the future- Plavix discontinued.  CSF culture from 10/15 now with Candida albicans- sensitivities sent to [facility] (pending).  CSF studies for Coccidioides, Blastomyces, Histoplasma, and paraneoplastic panel pending.    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 Current SCr is increased at 1.44 (BL ~1.2-1.4). Will provide pre and post hydration of amphotericin B with 750 mL NS. Continue to watch renal function and further adjust NS if Scr continues to increase.   Potassium remains stable (4.3 today) and patient is currently on a daily potassium supplement. Mg today is 1.9- will supplement with 2g today.  AST/ALT are wnl - will continue to monitor while on RIPE therapy.   Plan: Amphotericin B 310 mg (~ 5 mg/kg) every 24 hours  Give 750 mL of normal saline 1 hour prior to dose Flush line with dextrose in-between normal saline and amphotericin B doses Give another 750 mL normal saline after amphotericin B infusion is complete  Administration instructions reviewed with RN Daily K supplement Supplement Mg as needed F/u daily Scr, K, and Mg levels  Rifampin 600 mg daily Isoniazid 300 mg daily Pyridoxine 50 mg daily Pyrazinamide 1500 mg daily Ethambutol 1200 mg daily Dexamethasone IV 0.3 mg/kg (~18 mg) daily for 2 weeks- Will taper as appropriate pending results of CSF   Height: 5\' 10"  (177.8 cm) Weight: 66 kg (145 lb 8.1 oz) IBW/kg (Calculated) : 73  Temp (24hrs), Avg:97.6 F (36.4 C), Min:97.3 F (36.3 C), Max:97.9 F (36.6 C)  Recent Labs  Lab 02/27/20 0501 02/28/20 0528 02/29/20 0824 03/01/20 0211 03/02/20 0252  CREATININE 1.30* 1.36* 1.42* 1.40* 1.44*    Estimated Creatinine Clearance: 43.3 mL/min (A) (by  C-G formula based on SCr of 1.44 mg/dL (H)).    Allergies  Allergen Reactions  . Lisinopril Anaphylaxis, Cough and Other (See Comments)    Other reaction(s): anaphylaxis Pt states it made him "cough his head off"   . Lorazepam     Other reaction(s): Hallucinations     Dimple Nanas, PharmD PGY-1 Acute Care Pharmacy Resident Office: [phone] 03/02/2020 9:08 AM

## 2020-03-02 NOTE — Progress Notes (Signed)
CRITICAL VALUE ALERT  Critical Value:  CBG 29 pt awake/alert  Date & Time Notied:  03/02/2020 0820  Provider Notified: Dr. Avon Gully  Orders Received/Actions taken: D5LR 36ml/hr initiated per ordered. Fed pt juices and breakfast. Will recheck glucose and continue with q 4 hrs CBG check.

## 2020-03-02 NOTE — Progress Notes (Signed)
PROGRESS NOTE    Howard Allen  LZJ:[id number] DOB: 07-Oct-1945 DOA: 02/13/2020 PCP: System, Provider Not In   Brief Narrative: The patient is a 74 year old Caucasian male with a past medical history significant for Crohn's disease, hypertension, hyperlipidemia, depression history of hospitalization in June 2021 for pneumatosis status post ileocolonic resection and lysis of adhesions who presented with multiple tonic-clonic seizures complicated by status epilepticus that required intubation and loading with Keppra. CT of the head was negative and initially when he was brought in to the ED by EMS there was at least 3 witnessed generalized tonic-clonic seizures noted. PCCM was consulted for admission given that he was intubated on arrival and unresponsive. He was intubated on 02/13/2020 and extubated 02/15/2020. Neurology was consulted and he underwent further work-up and was found to have a signal abnormality in the left limb is most consistent with an acute infarct. Echocardiogram done showed an EF of 55 to 60% with moderate LVH and grade 1 diastolic dysfunction. EEG testing on 10/9 was negative. 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 He underwent several viral serologies and had a lumbar puncture sent on 02/14/2020. He was started on IV vancomycin, IV ceftriaxone, IV ampicillin and IV acyclovir for suspected meningitis. CSF analysis was consistent with aseptic meningitis with high white blood cells and very low glucose level. CSF culture was initally negative. He was transferred to [facility] service on 02/19/2020 and remained confused. Subsequently ID was consulted and they were suspecting aseptic meningoencephalitis and recommended large volume LP. Patient was already on aspirin and Plavix for past 5 days. His Plavix was stopped on 02/20/2020. P2 Y 12 was checked and based on that, neurology performed another large-volume LP on 02/22/2020 and several labs were sent by ID. 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  Assessment & Plan:   Principal Problem:   Aseptic meningitis Active Problems:   Seizure ([place])   Weight loss   Encounter for nasogastric (NG) tube placement   Malnutrition of moderate degree   Aseptic meningitis - LP performed on 10/7 consistent with meningitis - LP Cultures positive for candida as of 02/27/20 - already on amphotericin B - ID following - appreciate insight and recommendations - VDLR and cryptococcal antigen negative, and [place]ile serology pending - Quantiferon gold was indeterminate.  Patient was on vancomycin, ceftriaxone and acyclovir. Consulted ID on 02/20/2020.  Dr. Tommy Medal recommended repeat large-volume LP to send more labs. Rocephin and vancomycin discontinued but acyclovir continues.  Had a long multidisciplinary discussion between me, Dr. Tommy Medal and Dr. Hortense Ramal of neurology on 02/21/2020.  - Patient finally underwent large-volume LP by neurology on 02/22/2020. 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   - Plavix on hold as ID plans to do third LP - will wait until remainder of labs result per ID before moving forward with repeat LP  - Per ID: currently waiting for fungal antigen/serologies back from CSF for hopeful diagnosis OR w negative assays  - will repeat LP to look again for carcinomatous meningitis - stay on abx to verify if CSF appearance changes on repeat testing.  Acute ischemic infarct  - Seen on MRI imaging on 10/7: left thalamic signal abnormality measuring 2.3 cm most consistent with acute infarct; no edema or  hemorrhagic conversion. (neuro suspects 2/2 prolonged seizure) - Will need outpt MRI in 8-12 weeks to help delineate this -outpt neuro f/u as well - CTA head and neck without large  vessel occlusion or proximal stenosis, small ICA aneurysm 2 mm, right upper pulmonary nodule 5 mm, recommend outpatient follow-up imaging per protocol. - Now on atorvastatin 80 mg despite that he takes 20 at home given his acute CVA - TTE negative for vegetations or thrombosis.  - Continue high intensity statin; Lipid panel done and showed a total cholesterol/HDL ratio 3.3, cholesterol level 154, HDL level 46, LDL 79, triglycerides 146, VLDL 29 - PT/OT recommending CIR - appreciate neurology evaluation/recommendations.  Currently on aspirin, Plavix on hold for possible repeat LP - see above  Tonic-Clonic Seizures, Status Epilepticus:  - Resolved - Secondary to meningitis - Neurology on board; appreciate their recommendations - Continue Keppra  Moderate to severe protein caloric malnutrition - Likely due to malnutrition and anorexia, possibly worsened by malabsorption given bowel inflammation seen on imaging the past several months prior to resection. Of note, 70 lb weight loss in the past 10 months - Consult to dietician -now off tube feeds, tolerating dysphagia 2 diet quite poorly, unable to feed himself -Patient continues to have moderate hypoglycemia essentially every morning at this point due to poor p.o. intake, initiate D5 LR 03/02/2020, continue to advance diet, discontinue IV fluids once tolerating p.o. safely. -Do not feel that this is a chronic condition and that PEG tube at this juncture is unnecessary however if patient continues to have worsening p.o. intake or is unable to feed himself appropriately this could be considered in the near future. -Patient remains off all insulin given transient episodes of hypoglycemia the past few mornings  Large hematoma above right eye, resolving  - He developed large hematoma on the morning of 02/22/2020 - Initially he was unable to open his eye initially - ENT consulted and he was evaluated by them but they did not feel the need to evacuate  hematoma, now resolving - Patient's hematoma is decreasing in size and he is able to open his eye without issues  Hypomagnesemia - Resolved  Hyperphosphatemia - Follow PTH, CPK levels - Previously low in the setting of malnutrition/refeeding syndrome - Likely compounded by AKI as below  HTN/HLD - Home medications include Toprol 100 mg, Amlodipine 5 mg, Lipitor 20 mg, Fenofibrate 160 mg.  Blood pressure very well controlled.  Will continue amlodipine to 10 mg and Toprol-XL 100 mg p.o. daily.   Normocytic Anemia  Hx of Iron Deficiency Anemia B12 Deficiency  - B12 levels low. Iron panel shows low saturation, however normal iron and TIBC, unclear picture as not consistent completely with iron deficiency anemia or anemia of chronic disease.  Hemoglobin is stable.  Questionable Crohn's Disease s/p Ileocolic Resection  - No longer on immunosuppressive therapy. - Takes budesonide as an outpatient -currently on hold  AKI on CKD stage II, stable - Slight jump in creatinine.   - Likely due to dehydration, DC fluids and increase PO intake as appropriate  Lab Results  Component Value Date   CREATININE 1.44 (H) 03/02/2020    DVT prophylaxis: SCDs Heparin 5000 units subcu every 8 hours Code Status: FULL CODE  Family Communication: Daughter Threasa Beards updated over the phone Disposition Plan: Plan to discharge to CIR once cleared by ID and neurology.  Status is: Inpatient  Remains inpatient appropriate because:Altered mental status, Unsafe d/c plan, IV treatments appropriate due to intensity of illness or  inability to take PO and Inpatient level of care appropriate due to severity of illness  Dispo: The patient is from: Home              Anticipated d/c is to: CIR              Anticipated d/c date is: >4 days              Patient currently is not medically stable to d/c.  Consultants:   Neurology  Infectious disease  PCCM Transfer   Procedures:  10/6 >> ETT placed 10/7 >>  LP 10/8 >> Extubated   Significant Diagnostic Tests:  10/6 CT Head >> No acute intracranial abnormalities; age-related volume loss and chronic small vessel disease 10/7 MRI brain: 1. Signal abnormality in the left thalamus most consistent with an acute infarct. 2. Signal abnormality in the mesial left temporal lobe and splenium of the corpus callosum which may reflect acute infarct, sequelae of recent seizure activity, or encephalitis (including herpes encephalitis). No contralateral cerebral involvement. 3. Mild chronic small vessel ischemic disease. 10/8 echo: LVEF 55-60%, mod LVH, Grade I diastolic dysfunction 19/5 CT Head >> Progressive low density in the left thalamus related to acute infarction; no hemorrhage 10/9 EEG >> Negative  Micro Data:  10/6 COVID-19 + influenza >> Negative  10/6 Blood Cultures >> NGTD 10/7 CSF Culture >> NGTD 10/7 HSV serology >> 2.02 on igg testing. Will send pcr to determine past or active.  10/7 West Nile serology >>  10/7 VRDL serology >> 10/8 Quanitferon Gold  >> indeterminant 10/9 acid fast csf: Pending 10/11 HSV PCR: pending  10/15 -CSF culture -Candida albicans  Antimicrobials:  Anti-infectives (From admission, onward)   Start     Dose/Rate Route Frequency Ordered Stop   02/26/20 1115  rifampin (RIFADIN) 600 mg in sodium chloride 0.9 % 100 mL IVPB        600 mg 200 mL/hr over 30 Minutes Intravenous Every 24 hours 02/26/20 1026     02/23/20 0100  acyclovir (ZOVIRAX) 615 mg in dextrose 5 % 100 mL IVPB  Status:  Discontinued        10 mg/kg  61.3 kg 112.3 mL/hr over 60 Minutes Intravenous Every 12 hours 02/22/20 1537 02/24/20 0936   02/22/20 2100  amphotericin B liposome (AMBISOME) 310 mg in dextrose 5 % 500 mL IVPB        5 mg/kg  61.3 kg 288.8 mL/hr over 120 Minutes Intravenous Every 24 hours 02/22/20 1524     02/22/20 1800  rifampin (RIFADIN) capsule 600 mg  Status:  Discontinued        600 mg Oral Daily 02/22/20 1520 02/26/20 1026    02/22/20 1800  isoniazid (NYDRAZID) tablet 300 mg        300 mg Oral Daily 02/22/20 1520     02/22/20 1800  pyrazinamide tablet 1,500 mg        1,500 mg Oral Daily 02/22/20 1520     02/22/20 1800  ethambutol (MYAMBUTOL) tablet 1,200 mg        1,200 mg Oral Daily 02/22/20 1520     02/20/20 1000  vancomycin (VANCOCIN) IVPB 1000 mg/200 mL premix  Status:  Discontinued        1,000 mg 200 mL/hr over 60 Minutes Intravenous Every 24 hours 02/20/20 0555 02/21/20 1259   02/19/20 1811  vancomycin variable dose per unstable renal function (pharmacist dosing)  Status:  Discontinued         Does  not apply See admin instructions 02/19/20 1811 02/20/20 0555   02/18/20 0500  vancomycin (VANCOREADY) IVPB 750 mg/150 mL  Status:  Discontinued        750 mg 150 mL/hr over 60 Minutes Intravenous Every 12 hours 02/17/20 1651 02/19/20 1811   02/15/20 0300  vancomycin (VANCOREADY) IVPB 500 mg/100 mL        500 mg 100 mL/hr over 60 Minutes Intravenous Every 12 hours 02/14/20 1352 02/17/20 1755   02/14/20 1400  acyclovir (ZOVIRAX) 690 mg in dextrose 5 % 100 mL IVPB  Status:  Discontinued        10 mg/kg  68.9 kg 113.8 mL/hr over 60 Minutes Intravenous Every 12 hours 02/14/20 1245 02/22/20 1537   02/14/20 1400  vancomycin (VANCOREADY) IVPB 1250 mg/250 mL        1,250 mg 166.7 mL/hr over 90 Minutes Intravenous STAT 02/14/20 1352 02/14/20 1655   02/14/20 1300  cefTRIAXone (ROCEPHIN) 2 g in sodium chloride 0.9 % 100 mL IVPB  Status:  Discontinued        2 g 200 mL/hr over 30 Minutes Intravenous Every 12 hours 02/14/20 1245 02/20/20 1732   02/14/20 1300  ampicillin (OMNIPEN) 2 g in sodium chloride 0.9 % 100 mL IVPB  Status:  Discontinued        2 g 300 mL/hr over 20 Minutes Intravenous Every 6 hours 02/14/20 1245 02/18/20 1051       Subjective: No acute issues or events overnight, patient awake alert oriented to person and general situation, correctly recalls his home city state, family members but has  difficulty orienting to place and time.  Answers most questions with "I do not know" otherwise.  Review of systems somewhat limited.  Objective: Vitals:   03/01/20 1557 03/01/20 2011 03/01/20 2352 03/02/20 0333  BP: 127/76 123/73 138/75 140/73  Pulse: 63 (!) 56 (!) 53 (!) 53  Resp: 18 16 18 16   Temp: (!) 97.4 F (36.3 C) (!) 97.3 F (36.3 C) 97.6 F (36.4 C) 97.9 F (36.6 C)  TempSrc: Oral Oral Oral Oral  SpO2: 99% 100% 100% 90%  Weight:    66 kg  Height:        Intake/Output Summary (Last 24 hours) at 03/02/2020 0720 Last data filed at 03/02/2020 0336 Gross per 24 hour  Intake 2542.64 ml  Output 1350 ml  Net 1192.64 ml   Filed Weights   02/29/20 0500 03/01/20 0645 03/02/20 0333  Weight: 63.2 kg 63.8 kg 66 kg   Examination:  General:  Pleasantly resting in bed, No acute distress.  Alert to person/general situation only HEENT: Moderate resolving ecchymosis/small hematoma over right lateral eye.  Stitches clean dry intact.  Sclerae nonicteric, noninjected.  Extraocular movements intact bilaterally. Neck:  Without mass or deformity.  Trachea is midline. Lungs:  Clear to auscultate bilaterally without rhonchi, wheeze, or rales. Heart:  Regular rate and rhythm.  Without murmurs, rubs, or gallops. Abdomen:  Soft, nontender, nondistended.  Without guarding or rebound. Extremities: Without cyanosis, clubbing, edema, or obvious deformity. Vascular:  Dorsalis pedis and posterior tibial pulses palpable bilaterally. Skin:  Warm and dry, no erythema, no ulcerations.   Data Reviewed: I have personally reviewed following labs and imaging studies  CBC: No results for input(s): WBC, NEUTROABS, HGB, HCT, MCV, PLT in the last 168 hours. Basic Metabolic Panel: Recent Labs  Lab 02/25/20 0216 02/25/20 0216 02/26/20 0240 02/26/20 0803 02/27/20 0501 02/27/20 0501 02/28/20 1950 02/28/20 1413 02/29/20 9326 03/01/20 0211 03/02/20 7124  NA 132*   < > 130*  --  132*  --  130*  --   131* 132* 130*  K 4.1   < > 3.9  --  4.9  --  4.0  --  4.4 4.2 4.3  CL 106   < > 100  --  105  --  102  --  99 103 103  CO2 17*   < > 19*  --  16*  --  18*  --  20* 17* 19*  GLUCOSE 100*   < > 88  --  99  --  134*  --  97 60* 95  BUN 25*   < > 30*  --  33*  --  33*  --  32* 34* 28*  CREATININE 1.23   < > 1.35*  --  1.30*  --  1.36*  --  1.42* 1.40* 1.44*  CALCIUM 9.1   < > 9.3  --  9.4   < > 9.3 9.8 9.5 9.1 8.9  MG 1.7  --   --    < > 2.0  --  1.8  --  2.0 1.9 1.9  PHOS 3.7  --  5.8*  --  6.0*  --  6.6*  --   --   --  6.6*   < > = values in this interval not displayed.   GFR: Estimated Creatinine Clearance: 43.3 mL/min (A) (by C-G formula based on SCr of 1.44 mg/dL (H)). Liver Function Tests: Recent Labs  Lab 02/29/20 0824  AST 62*  ALT 33  ALKPHOS 46  BILITOT 0.7  PROT 5.9*  ALBUMIN 3.3*   No results for input(s): LIPASE, AMYLASE in the last 168 hours. No results for input(s): AMMONIA in the last 168 hours. Coagulation Profile: No results for input(s): INR, PROTIME in the last 168 hours. Cardiac Enzymes: Recent Labs  Lab 02/28/20 1413  CKTOTAL 22*   BNP (last 3 results) No results for input(s): PROBNP in the last 8760 hours. HbA1C: No results for input(s): HGBA1C in the last 72 hours. CBG: Recent Labs  Lab 03/01/20 1158 03/01/20 1554 03/01/20 2013 03/01/20 2356 03/02/20 0330  GLUCAP 72 121* 114* 40* 82   Lipid Profile: No results for input(s): CHOL, HDL, LDLCALC, TRIG, CHOLHDL, LDLDIRECT in the last 72 hours. Thyroid Function Tests: No results for input(s): TSH, T4TOTAL, FREET4, T3FREE, THYROIDAB in the last 72 hours. Anemia Panel: No results for input(s): VITAMINB12, FOLATE, FERRITIN, TIBC, IRON, RETICCTPCT in the last 72 hours. Sepsis Labs: No results for input(s): PROCALCITON, LATICACIDVEN in the last 168 hours.  Recent Results (from the past 240 hour(s))  Culture, fungus without smear     Status: Abnormal (Preliminary result)   Collection Time:  02/22/20 11:38 AM   Specimen: CSF; Other  Result Value Ref Range Status   Specimen Description CSF TUBE 2  Final   Special Requests Immunocompromised  Final   Culture (A)  Final    [facility] CRITICAL RESULT CALLED TO, READ BACK BY AND VERIFIED WITH: DR Drucilla Schmidt @0808  02/27/20 EB Sent to [place] for further susceptibility testing. Performed at [facility], [place] [address] [place], [place] [address]    Report Status PENDING  Incomplete  Acid Fast Smear (AFB)     Status: None   Collection Time: 02/22/20 11:38 AM   Specimen: CSF; Cerebrospinal Fluid  Result Value Ref Range Status   AFB Specimen Processing Concentration  Final   Acid Fast Smear Negative  Final  Comment: (NOTE) Performed At: [facility] [place], [place] [id number] Rush Farmer MD EX:[id number]    Source (AFB) CSF  Final    Comment: TUBE 2 Performed at [facility] [address] [place], [place] [address]   MTB RIF NAA Non-Sputum, w/o Culture     Status: None   Collection Time: 02/22/20 11:38 AM  Result Value Ref Range Status   Source CSF  Final    Comment: Performed at [facility], [place] [address] [place], [place] [address]   Specimen Source Comment  Final    Comment: Cerebrospinal fluid (CSF)   M tuberculosis complex Comment NOT DETECTED Final    Comment: (NOTE) Mycobacterium tuberculosis complex (MTBC) NOT detected. This test was developed and its performance characteristics determined by [facility]. It has not been cleared or approved by the Food and Drug Administration. Per the College of American Pathologists (CAP) guidelines, a culture must be performed on all samples regardless of the molecular test result. By ordering this test code, the client has assumed responsibility for the performance of the culture.    Rifampin Comment Susceptible Final    Comment: (NOTE) Because Mycobacterium tuberculosis complex (MTBC) was not detected, no rifampin  determination is possible. This test was developed and its performance characteristics determined by [facility]. It has not been cleared or approved by the Food and Drug Administration.    AFB Specimen Processing Concentration  Final    Comment: (NOTE) Performed At: [facility] [place], [place] [id number] Rush Farmer MD MP:[id number]      RN Pressure Injury Documentation:     Estimated body mass index is 20.88 kg/m as calculated from the following:   Height as of this encounter: 5\' 10"  (1.778 m).   Weight as of this encounter: 66 kg.  Malnutrition Type:  Nutrition Problem: Moderate Malnutrition Etiology: chronic illness   Malnutrition Characteristics:  Signs/Symptoms: mild fat depletion, moderate fat depletion, mild muscle depletion, moderate muscle depletion   Nutrition Interventions:  Interventions: Hormel Shake, Magic cup, MVI   Radiology Studies: No results found. Scheduled Meds: . amLODipine  10 mg Oral Daily  . aspirin  81 mg Oral Daily  . atorvastatin  80 mg Oral Daily  . Chlorhexidine Gluconate Cloth  6 each Topical Daily  . dexamethasone (DECADRON) injection  18 mg Intravenous Daily  . dextrose  10 mL Intravenous Q24H  . dextrose  10 mL Intravenous Q24H  . docusate  100 mg Oral BID  . ethambutol  1,200 mg Oral Daily  . fenofibrate  160 mg Oral Daily  . heparin  5,000 Units Subcutaneous Q8H  . isoniazid  300 mg Oral Daily  . levETIRAcetam  1,000 mg Oral Q12H  . metoprolol succinate  50 mg Oral Daily  . multivitamin with minerals  1 tablet Oral Daily  . pantoprazole sodium  40 mg Oral Daily  . polyethylene glycol  17 g Oral Daily  . potassium chloride  40 mEq Oral Daily  . pyrazinamide  1,500 mg Oral Daily  . vitamin B-6  50 mg Oral Daily  . sodium chloride  750 mL Intravenous Q24H  . vitamin B-12  1,000 mcg Oral Daily   Continuous Infusions: . sodium chloride Stopped (02/24/20 0116)  . sodium chloride Stopped (02/24/20  0218)  . amphotericin  B  Liposome (AMBISOME) ADULT IV Stopped (03/02/20 0117)  . rifampin (RIFADIN) IVPB Stopped (03/01/20 1419)  . sodium chloride Stopped (03/02/20 0253)  LOS: 18 days   Little Ishikawa, DO [facility] Pager: secure chat on epic  If 7PM-7AM, please contact night-coverage [email or web address]

## 2020-03-02 NOTE — Progress Notes (Signed)
Hypoglycemic Event  CBG: 40  Treatment: Dextrose  Symptoms: None  Follow-up CBG: Time  CBG Result:   Possible Reasons for Event: Not sure, no insuline was administered during this shift Comments/MD notified: Dr. Myna Hidalgo. See new orders    Wells Guiles Essien-Akpan

## 2020-03-03 DIAGNOSIS — R569 Unspecified convulsions: Secondary | ICD-10-CM | POA: Diagnosis not present

## 2020-03-03 DIAGNOSIS — G40901 Epilepsy, unspecified, not intractable, with status epilepticus: Secondary | ICD-10-CM | POA: Diagnosis not present

## 2020-03-03 DIAGNOSIS — G030 Nonpyogenic meningitis: Secondary | ICD-10-CM | POA: Diagnosis not present

## 2020-03-03 DIAGNOSIS — R634 Abnormal weight loss: Secondary | ICD-10-CM | POA: Diagnosis not present

## 2020-03-03 LAB — BASIC METABOLIC PANEL
Anion gap: 6 (ref 5–15)
BUN: 28 mg/dL — ABNORMAL HIGH (ref 8–23)
CO2: 22 mmol/L (ref 22–32)
Calcium: 9.1 mg/dL (ref 8.9–10.3)
Chloride: 105 mmol/L (ref 98–111)
Creatinine, Ser: 1.46 mg/dL — ABNORMAL HIGH (ref 0.61–1.24)
GFR, Estimated: 51 mL/min — ABNORMAL LOW (ref >60)
Glucose, Bld: 85 mg/dL (ref 70–99)
Potassium: 4.7 mmol/L (ref 3.5–5.1)
Sodium: 133 mmol/L — ABNORMAL LOW (ref 135–145)

## 2020-03-03 LAB — BLASTOMYCES ANTIGEN: Blastomyces Antigen: NOT DETECTED ng/mL

## 2020-03-03 LAB — GLUCOSE, CAPILLARY
Glucose-Capillary: 67 mg/dL — ABNORMAL LOW (ref 70–99)
Glucose-Capillary: 83 mg/dL (ref 70–99)
Glucose-Capillary: 74 mg/dL (ref 70–99)
Glucose-Capillary: 107 mg/dL — ABNORMAL HIGH (ref 70–99)
Glucose-Capillary: 115 mg/dL — ABNORMAL HIGH (ref 70–99)
Glucose-Capillary: 124 mg/dL — ABNORMAL HIGH (ref 70–99)

## 2020-03-03 LAB — MAGNESIUM: Magnesium: 2.2 mg/dL (ref 1.7–2.4)

## 2020-03-03 MED ORDER — SEVELAMER CARBONATE 0.8 G PO PACK
0.8000 g | PACK | Freq: Once | ORAL | Status: AC
  Administered 2020-03-03: 0.8 g via ORAL
  Filled 2020-03-03: qty 1

## 2020-03-03 MED ORDER — FLUCONAZOLE 200 MG PO TABS
800.0000 mg | ORAL_TABLET | Freq: Every day | ORAL | Status: DC
  Administered 2020-03-04 – 2020-03-12 (×9): 800 mg via ORAL
  Filled 2020-03-03 (×9): qty 4

## 2020-03-03 NOTE — Progress Notes (Signed)
  Speech Language Pathology Treatment: Dysphagia;Cognitive-Linquistic  Patient Details Name: Howard Allen MRN: [id number] DOB: 12-Oct-1945 Today's Date: 03/03/2020 Time: 3570-1779 SLP Time Calculation (min) (ACUTE ONLY): 23 min  Assessment / Plan / Recommendation Clinical Impression  Pt was seen for dysphagia treatment. He was seen during lunch with a tray of chopped chicken, mashed potatoes, green beans, a roll, and ice cream. He exhibited coughing with the roll when he attempted to elevate his head to watch TV and speak during mastication but otherwise tolerated solids without overt s/sx of aspiration. Pt demonstrated and maintained a chin tuck posture with intermittent cueing and no s/sx of aspiration were noted with trials of thin liquids via cup or straw. However, pt exhibited more difficulty maintaining a chin tuck posture when a straw was used and pt expressed that this was more difficult for him. It is recommended that his diet be advanced to include thin liquids and that full supervision be continued to facilitate pt's consistent use of the chin tuck posture. Pt has demonstrated improved awareness and memory skills. He asked multiple questions regarding his discharge and potential for him being driven back to [place]. Pt expressed that he would like to watch the game tonight and he completed a related problem solving task. He required cueing to identify which channel might be the most likely to have the game tonight. Pt initially stated, "I don't know" but subsequently expressed that he believed local channels would be more likely to have it than [employer]. SLP will continue to follow for treatment.     HPI HPI: 74 y.o. 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        SLP Plan  Continue with current plan of care       Recommendations  Diet recommendations: Dysphagia 3 (mechanical soft);Thin liquid Liquids provided via: Cup;Straw Medication Administration: Crushed with puree Supervision: Patient able to self feed;Full supervision/cueing for compensatory strategies Compensations: Minimize environmental distractions;Slow rate;Small sips/bites Postural Changes and/or Swallow Maneuvers: Seated upright 90 degrees                Oral Care Recommendations: Oral care BID Follow up Recommendations: Skilled Nursing facility SLP Visit Diagnosis: Dysphagia, oropharyngeal phase (R13.12) Plan: Continue with current plan of care       Rashay Barnette I. Hardin Negus, [place], [place] Office number [phone] Pager [name] 03/03/2020, 5:31 PM

## 2020-03-03 NOTE — Progress Notes (Signed)
[facility]   Reason for visit: Follow up on meningitis  Interval History: he has no new complaints.  Denies any headache, no neck pain.  Tb negative x 2, AFB negative smear.  Cultures of AFB and fungal ngtd.  He is feeling fine.     Physical Exam: Constitutional:  Vitals:   03/03/20 0407 03/03/20 0738  BP: (!) 147/72 126/65  Pulse: (!) 52 (!) 57  Resp: 20 16  Temp: 97.6 F (36.4 C) 97.6 F (36.4 C)  SpO2: 100% 99%   patient appears in NAD Respiratory: Normal respiratory effort; CTA B Cardiovascular: RRR GI: soft, nt, nd Neuro: not oriented to place though knows he is in the hospital  Review of Systems: Constitutional: negative for fevers and chills Gastrointestinal: negative for nausea, vomiting and diarrhea Integument/breast: negative for rash  Lab Results  Component Value Date   WBC 7.4 02/21/2020   HGB 10.4 (L) 02/21/2020   HCT 32.6 (L) 02/21/2020   MCV 88.6 02/21/2020   PLT 296 02/21/2020    Lab Results  Component Value Date   CREATININE 1.46 (H) 03/03/2020   BUN 28 (H) 03/03/2020   NA 133 (L) 03/03/2020   K 4.7 03/03/2020   CL 105 03/03/2020   CO2 22 03/03/2020    Lab Results  Component Value Date   ALT 33 02/29/2020   AST 62 (H) 02/29/2020   ALKPHOS 46 02/29/2020     Microbiology: Recent Results (from the past 240 hour(s))  Culture, fungus without smear     Status: Abnormal (Preliminary result)   Collection Time: 02/22/20 11:38 AM   Specimen: CSF; Other  Result Value Ref Range Status   Specimen Description CSF TUBE 2  Final   Special Requests Immunocompromised  Final   Culture (A)  Final    C[facility]IDA [facility] CRITICAL RESULT CALLED TO, READ BACK BY AND VERIFIED WITH: DR Drucilla Schmidt @[id number]  02/27/20 EB Sent to [place] for further susceptibility testing. Performed at [facility], [place] [address] [place], [place] [address]    Report Status PENDING  Incomplete  Acid Fast Smear (AFB)     Status: None   Collection  Time: 02/22/20 11:38 AM   Specimen: CSF; Cerebrospinal Fluid  Result Value Ref Range Status   AFB Specimen Processing Concentration  Final   Acid Fast Smear Negative  Final    Comment: (NOTE) Performed At: [facility] [place], [place] [id number] Rush Farmer MD [id number]    Source (AFB) CSF  Final    Comment: TUBE 2 Performed at [facility], [address] [place], [place] [address]   MTB RIF NAA Non-Sputum, w/o Culture     Status: None   Collection Time: 02/22/20 11:38 AM  Result Value Ref Range Status   Source CSF  Final    Comment: Performed at [facility], [place] [address] [place], [place] [address]   Specimen Source Comment  Final    Comment: Cerebrospinal fluid (CSF)   M tuberculosis complex Comment NOT DETECTED Final    Comment: (NOTE) Mycobacterium tuberculosis complex (MTBC) NOT detected. This test was developed and its performance characteristics determined by [facility]. It has not been cleared or approved by the Food and Drug Administration. Per the College of American Pathologists (CAP) guidelines, a culture must be performed on all samples regardless of the molecular test result. By ordering this test code, the client has assumed responsibility for the performance of the culture.  Rifampin Comment Susceptible Final    Comment: (NOTE) Because Mycobacterium tuberculosis complex (MTBC) was not detected, no rifampin determination is possible. This test was developed and its performance characteristics determined by [facility]. It has not been cleared or approved by the Food and Drug Administration.    AFB Specimen Processing Concentration  Final    Comment: (NOTE) Performed At: [facility] [place], [place] [id number] Rush Farmer MD SJ:[id number]     Impression/Plan:  1. Meningitis - no new findings to date.  Pending is the Coccidioides Ab and Blastomyces Ag from the CSF (sent out) and the  paraneoplastic panel that was sent to [facility].   All other findings negative including AFB/Tb probe, fungal cultures to date (except the Candida which is an unlikely pathogen), AFB cultures which will be monitored for 6 weeks.   With the negative Tb PCR and negative smears, I am going to stop RIPE and B6.   Candida is positive but I do not suspect this to be pathogenic.  No other positive growth.  I do however recommend he continue with fluconazole at discharge orally at 800 mg daily for another week with the possibility of infection.    No indication for continuation of steroids from ID standpoint, can do a taper at discharge  Unless there is any positive growth in the coming weeks, he will need follow up with neurology in [place] and probably not with ID.    I will continue to follow while here but ok from ID standpoint for discharge

## 2020-03-03 NOTE — Progress Notes (Signed)
PROGRESS NOTE    Howard Allen  SNK:[id number] DOB: 11/07/1945 DOA: 02/13/2020 PCP: System, Provider Not In   Brief Narrative: The patient is a 74 year old Caucasian male with a past medical history significant for Crohn's disease, hypertension, hyperlipidemia, depression history of hospitalization in June 2021 for pneumatosis status post ileocolonic resection and lysis of adhesions who presented with multiple tonic-clonic seizures complicated by status epilepticus that required intubation and loading with Keppra. CT of the head was negative and initially when he was brought in to the ED by EMS there was at least 3 witnessed generalized tonic-clonic seizures noted. PCCM was consulted for admission given that he was intubated on arrival and unresponsive. He was intubated on 02/13/2020 and extubated 02/15/2020. Neurology was consulted and he underwent further work-up and was found to have a signal abnormality in the left limb is most consistent with an acute infarct. Echocardiogram done showed an EF of 55 to 60% with moderate LVH and grade 1 diastolic dysfunction. EEG testing on 10/9 was negative. 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 He underwent several viral serologies and had a lumbar puncture sent on 02/14/2020. He was started on IV vancomycin, IV ceftriaxone, IV ampicillin and IV acyclovir for suspected meningitis. CSF analysis was consistent with aseptic meningitis with high white blood cells and very low glucose level. CSF culture was initally negative. He was transferred to [facility] service on 02/19/2020 and remained confused. Subsequently ID was consulted and they were suspecting aseptic meningoencephalitis and recommended large volume LP. Patient was already on aspirin and Plavix for past 5 days. His Plavix was stopped on 02/20/2020. P2 Y 12 was checked and based on that, neurology performed another large-volume LP on 02/22/2020 and several labs were sent by ID. 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  Assessment & Plan:   Principal Problem:   Aseptic meningitis Active Problems:   Seizure ([place])   Weight loss   Encounter for nasogastric (NG) tube placement   Malnutrition of moderate degree   Aseptic meningitis - LP performed on 10/7 consistent with meningitis - LP Cultures positive for candida as of 02/27/20 - already on amphotericin B - ID following - appreciate insight and recommendations - VDLR and cryptococcal antigen negative, and [place]ile serology pending - Quantiferon gold was indeterminate.  Patient was on vancomycin, ceftriaxone and acyclovir. Consulted ID on 02/20/2020.  Dr. Tommy Medal recommended repeat large-volume LP to send more labs. Rocephin and vancomycin discontinued but acyclovir continues.  Had a long multidisciplinary discussion between me, Dr. Tommy Medal and Dr. Hortense Ramal of neurology on 02/21/2020.  - Patient finally underwent large-volume LP by neurology on 02/22/2020. 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   - Plavix on hold as ID plans to do third LP - will wait until remainder of labs result per ID before moving forward with repeat LP  - Per ID: currently waiting for fungal antigen/serologies back from CSF for hopeful diagnosis OR w negative assays  - will repeat LP to look again for carcinomatous meningitis - stay on abx to verify if CSF appearance changes on repeat testing.  Acute ischemic infarct  - Seen on MRI imaging on 10/7: left thalamic signal abnormality measuring 2.3 cm most consistent with acute infarct; no edema or  hemorrhagic conversion. (neuro suspects 2/2 prolonged seizure) - Will need outpt MRI in 8-12 weeks to help delineate this -outpt neuro f/u as well - CTA head and neck without large  vessel occlusion or proximal stenosis, small ICA aneurysm 2 mm, right upper pulmonary nodule 5 mm, recommend outpatient follow-up imaging per protocol. - Now on atorvastatin 80 mg despite that he takes 20 at home given his acute CVA - TTE negative for vegetations or thrombosis.  - Continue high intensity statin; Lipid panel done and showed a total cholesterol/HDL ratio 3.3, cholesterol level 154, HDL level 46, LDL 79, triglycerides 146, VLDL 29 - PT/OT recommending CIR - appreciate neurology evaluation/recommendations.  Currently on aspirin, Plavix on hold for possible repeat LP - see above  Tonic-Clonic Seizures, Status Epilepticus:  - Resolved - Secondary to meningitis - Neurology on board; appreciate their recommendations - Continue Keppra  Moderate to severe protein caloric malnutrition - Likely due to malnutrition and anorexia, possibly worsened by malabsorption given bowel inflammation seen on imaging the past several months prior to resection. Of note, 70 lb weight loss in the past 10 months - Consult to dietician -now off tube feeds, tolerating dysphagia 2 diet quite poorly, unable to feed himself -Patient continues to have moderate hypoglycemia essentially every morning at this point due to poor p.o. intake, initiate D5 LR 03/02/2020, continue to advance diet, discontinue IV fluids once tolerating p.o. safely. -Do not feel that this is a chronic condition and that PEG tube at this juncture is unnecessary however if patient continues to have worsening p.o. intake or is unable to feed himself appropriately this could be considered in the near future. -Patient remains off all insulin given transient episodes of hypoglycemia the past few mornings  Large hematoma above right eye, resolving  - He developed large hematoma on the morning of 02/22/2020 - Initially he was unable to open his eye initially - ENT consulted and he was evaluated by them but they did not feel the need to evacuate  hematoma, now resolving - Patient's hematoma is decreasing in size and he is able to open his eye without issues  Hypomagnesemia - Resolved  Hyperphosphatemia - Low PTH (14), CK low (22) - Sevelamer x1 dose (800mg ) - Previously low in the setting of malnutrition/refeeding syndrome - unclear why elevated - dietary following closely - no high phos in supplements - Likely compounded by AKI as below  HTN/HLD - Home medications include Toprol 100 mg, Amlodipine 5 mg, Lipitor 20 mg, Fenofibrate 160 mg.  Blood pressure very well controlled.  Will continue amlodipine to 10 mg and Toprol-XL 100 mg p.o. daily.   Normocytic Anemia  Hx of Iron Deficiency Anemia B12 Deficiency  - B12 levels low. Iron panel shows low saturation, however normal iron and TIBC, unclear picture as not consistent completely with iron deficiency anemia or anemia of chronic disease.  Hemoglobin is stable.  Questionable Crohn's Disease s/p Ileocolic Resection  - No longer on immunosuppressive therapy. - Takes budesonide as an outpatient -currently on hold  AKI on CKD stage II, stable - Slight jump in creatinine.   - Likely due to dehydration, DC fluids and increase PO intake as appropriate  Lab Results  Component Value Date   CREATININE 1.46 (H) 03/03/2020    DVT prophylaxis: SCDs Heparin 5000 units subcu every 8 hours Code Status: FULL CODE  Family Communication: Daughter Threasa Beards updated over the phone Disposition Plan: Plan to discharge to CIR once cleared by ID and neurology.  Ultimate  disposition is likely discharge back to [place] after CIR to either SNF versus home pending patient's clinical improvement.  Status is: Inpatient  Remains inpatient appropriate because:Altered mental status, Unsafe d/c plan, IV treatments appropriate due to intensity of illness or inability to take PO and Inpatient level of care appropriate due to severity of illness  Dispo: The patient is from: Home               Anticipated d/c is to: CIR              Anticipated d/c date is: >4 days              Patient currently is not medically stable to d/c.  Consultants:   Neurology  Infectious disease  PCCM Transfer   Procedures:  10/6 >> ETT placed 10/7 >> LP 10/8 >> Extubated   Significant Diagnostic Tests:  10/6 CT Head >> No acute intracranial abnormalities; age-related volume loss and chronic small vessel disease 10/7 MRI brain: 1. Signal abnormality in the left thalamus most consistent with an acute infarct. 2. Signal abnormality in the mesial left temporal lobe and splenium of the corpus callosum which may reflect acute infarct, sequelae of recent seizure activity, or encephalitis (including herpes encephalitis). No contralateral cerebral involvement. 3. Mild chronic small vessel ischemic disease. 10/8 echo: LVEF 55-60%, mod LVH, Grade I diastolic dysfunction 95/1 CT Head >> Progressive low density in the left thalamus related to acute infarction; no hemorrhage 10/9 EEG >> Negative  Micro Data:  10/6 COVID-19 + influenza >> Negative  10/6 Blood Cultures >> NGTD 10/7 CSF Culture >> NGTD 10/7 HSV serology >> 2.02 on igg testing. Will send pcr to determine past or active.  10/7 West Nile serology >>  10/7 VRDL serology >> 10/8 Quanitferon Gold  >> indeterminant 10/9 acid fast csf: Pending 10/11 HSV PCR: pending  10/15 -CSF culture -Candida albicans  Antimicrobials:  Anti-infectives (From admission, onward)   Start     Dose/Rate Route Frequency Ordered Stop   02/26/20 1115  rifampin (RIFADIN) 600 mg in sodium chloride 0.9 % 100 mL IVPB        600 mg 200 mL/hr over 30 Minutes Intravenous Every 24 hours 02/26/20 1026     02/23/20 0100  acyclovir (ZOVIRAX) 615 mg in dextrose 5 % 100 mL IVPB  Status:  Discontinued        10 mg/kg  61.3 kg 112.3 mL/hr over 60 Minutes Intravenous Every 12 hours 02/22/20 1537 02/24/20 0936   02/22/20 2100  amphotericin B liposome (AMBISOME) 310 mg in  dextrose 5 % 500 mL IVPB        5 mg/kg  61.3 kg 288.8 mL/hr over 120 Minutes Intravenous Every 24 hours 02/22/20 1524     02/22/20 1800  rifampin (RIFADIN) capsule 600 mg  Status:  Discontinued        600 mg Oral Daily 02/22/20 1520 02/26/20 1026   02/22/20 1800  isoniazid (NYDRAZID) tablet 300 mg        300 mg Oral Daily 02/22/20 1520     02/22/20 1800  pyrazinamide tablet 1,500 mg        1,500 mg Oral Daily 02/22/20 1520     02/22/20 1800  ethambutol (MYAMBUTOL) tablet 1,200 mg        1,200 mg Oral Daily 02/22/20 1520     02/20/20 1000  vancomycin (VANCOCIN) IVPB 1000 mg/200 mL premix  Status:  Discontinued        1,000  mg 200 mL/hr over 60 Minutes Intravenous Every 24 hours 02/20/20 0555 02/21/20 1259   02/19/20 1811  vancomycin variable dose per unstable renal function (pharmacist dosing)  Status:  Discontinued         Does not apply See admin instructions 02/19/20 1811 02/20/20 0555   02/18/20 0500  vancomycin (VANCOREADY) IVPB 750 mg/150 mL  Status:  Discontinued        750 mg 150 mL/hr over 60 Minutes Intravenous Every 12 hours 02/17/20 1651 02/19/20 1811   02/15/20 0300  vancomycin (VANCOREADY) IVPB 500 mg/100 mL        500 mg 100 mL/hr over 60 Minutes Intravenous Every 12 hours 02/14/20 1352 02/17/20 1755   02/14/20 1400  acyclovir (ZOVIRAX) 690 mg in dextrose 5 % 100 mL IVPB  Status:  Discontinued        10 mg/kg  68.9 kg 113.8 mL/hr over 60 Minutes Intravenous Every 12 hours 02/14/20 1245 02/22/20 1537   02/14/20 1400  vancomycin (VANCOREADY) IVPB 1250 mg/250 mL        1,250 mg 166.7 mL/hr over 90 Minutes Intravenous STAT 02/14/20 1352 02/14/20 1655   02/14/20 1300  cefTRIAXone (ROCEPHIN) 2 g in sodium chloride 0.9 % 100 mL IVPB  Status:  Discontinued        2 g 200 mL/hr over 30 Minutes Intravenous Every 12 hours 02/14/20 1245 02/20/20 1732   02/14/20 1300  ampicillin (OMNIPEN) 2 g in sodium chloride 0.9 % 100 mL IVPB  Status:  Discontinued        2 g 300 mL/hr over 20  Minutes Intravenous Every 6 hours 02/14/20 1245 02/18/20 1051       Subjective: No acute issues or events overnight, patient awake alert oriented to person and general situation, correctly recalls his home city state, family members but has difficulty orienting to place and time.  Answers most questions with "I do not know" otherwise.  Review of systems somewhat limited.  Objective: Vitals:   03/02/20 1558 03/02/20 2015 03/03/20 0011 03/03/20 0407  BP: (!) 110/54 (!) 112/50 (!) 143/68 (!) 147/72  Pulse: (!) 52 (!) 49 (!) 53 (!) 52  Resp: 18 18 16 20   Temp: (!) 97.4 F (36.3 C) (!) 97.3 F (36.3 C) (!) 97.4 F (36.3 C) 97.6 F (36.4 C)  TempSrc: Oral Oral Oral Oral  SpO2: 99% 100% 100% 100%  Weight:      Height:        Intake/Output Summary (Last 24 hours) at 03/03/2020 0727 Last data filed at 03/02/2020 1600 Gross per 24 hour  Intake 400.6 ml  Output --  Net 400.6 ml   Filed Weights   02/29/20 0500 03/01/20 0645 03/02/20 0333  Weight: 63.2 kg 63.8 kg 66 kg   Examination:  General:  Pleasantly resting in bed, No acute distress.  Alert to person/general situation only HEENT: Moderate resolving ecchymosis/small hematoma over right lateral eye.  Stitches clean dry intact.  Sclerae nonicteric, noninjected.  Extraocular movements intact bilaterally. Neck:  Without mass or deformity.  Trachea is midline. Lungs:  Clear to auscultate bilaterally without rhonchi, wheeze, or rales. Heart:  Regular rate and rhythm.  Without murmurs, rubs, or gallops. Abdomen:  Soft, nontender, nondistended.  Without guarding or rebound. Extremities: Without cyanosis, clubbing, edema, or obvious deformity. Vascular:  Dorsalis pedis and posterior tibial pulses palpable bilaterally. Skin:  Warm and dry, no erythema, no ulcerations.   Data Reviewed: I have personally reviewed following labs and imaging studies  CBC:  No results for input(s): WBC, NEUTROABS, HGB, HCT, MCV, PLT in the last 168  hours. Basic Metabolic Panel: Recent Labs  Lab 02/26/20 0240 02/26/20 0803 02/27/20 0501 02/27/20 0501 02/28/20 0528 02/28/20 1413 02/29/20 0824 03/01/20 0211 03/02/20 0252 03/03/20 0443  NA 130*  --  132*   < > 130*  --  131* 132* 130* 133*  K 3.9  --  4.9   < > 4.0  --  4.4 4.2 4.3 4.7  CL 100  --  105   < > 102  --  99 103 103 105  CO2 19*  --  16*   < > 18*  --  20* 17* 19* 22  GLUCOSE 88  --  99   < > 134*  --  97 60* 95 85  BUN 30*  --  33*   < > 33*  --  32* 34* 28* 28*  CREATININE 1.35*  --  1.30*   < > 1.36*  --  1.42* 1.40* 1.44* 1.46*  CALCIUM 9.3  --  9.4   < > 9.3 9.8 9.5 9.1 8.9 9.1  MG  --    < > 2.0   < > 1.8  --  2.0 1.9 1.9 2.2  PHOS 5.8*  --  6.0*  --  6.6*  --   --   --  6.6*  --    < > = values in this interval not displayed.   GFR: Estimated Creatinine Clearance: 42.7 mL/min (A) (by C-G formula based on SCr of 1.46 mg/dL (H)). Liver Function Tests: Recent Labs  Lab 02/29/20 0824  AST 62*  ALT 33  ALKPHOS 46  BILITOT 0.7  PROT 5.9*  ALBUMIN 3.3*   No results for input(s): LIPASE, AMYLASE in the last 168 hours. No results for input(s): AMMONIA in the last 168 hours. Coagulation Profile: No results for input(s): INR, PROTIME in the last 168 hours. Cardiac Enzymes: Recent Labs  Lab 02/28/20 1413  CKTOTAL 22*   BNP (last 3 results) No results for input(s): PROBNP in the last 8760 hours. HbA1C: No results for input(s): HGBA1C in the last 72 hours. CBG: Recent Labs  Lab 03/02/20 1558 03/02/20 1935 03/02/20 2329 03/03/20 0434 03/03/20 0657  GLUCAP 126* 122* 99 67* 83   Lipid Profile: No results for input(s): CHOL, HDL, LDLCALC, TRIG, CHOLHDL, LDLDIRECT in the last 72 hours. Thyroid Function Tests: No results for input(s): TSH, T4TOTAL, FREET4, T3FREE, THYROIDAB in the last 72 hours. Anemia Panel: No results for input(s): VITAMINB12, FOLATE, FERRITIN, TIBC, IRON, RETICCTPCT in the last 72 hours. Sepsis Labs: No results for input(s):  PROCALCITON, LATICACIDVEN in the last 168 hours.  Recent Results (from the past 240 hour(s))  Culture, fungus without smear     Status: Abnormal (Preliminary result)   Collection Time: 02/22/20 11:38 AM   Specimen: CSF; Other  Result Value Ref Range Status   Specimen Description CSF TUBE 2  Final   Special Requests Immunocompromised  Final   Culture (A)  Final    [facility] CRITICAL RESULT CALLED TO, READ BACK BY AND VERIFIED WITH: DR Drucilla Schmidt @[phone]  02/27/20 EB Sent to [facility] for further susceptibility testing. Performed at [facility], [place] [address], [place],  [address]    Report Status PENDING  Incomplete  Acid Fast Smear (AFB)     Status: None   Collection Time: 02/22/20 11:38 AM   Specimen: CSF; Cerebrospinal Fluid  Result Value Ref Range Status  AFB Specimen Processing Concentration  Final   Acid Fast Smear Negative  Final    Comment: (NOTE) Performed At: [facility] [place], [place] [id number] Rush Farmer MD [id number]    Source (AFB) CSF  Final    Comment: TUBE 2 Performed at [facility], [place] [address], [place], [place] [address]   MTB RIF NAA Non-Sputum, w/o Culture     Status: None   Collection Time: 02/22/20 11:38 AM  Result Value Ref Range Status   Source CSF  Final    Comment: Performed at [facility], [facility] [address], [place], [place] [address]   Specimen Source Comment  Final    Comment: Cerebrospinal fluid (CSF)   M tuberculosis complex Comment NOT DETECTED Final    Comment: (NOTE) Mycobacterium tuberculosis complex (MTBC) NOT detected. This test was developed and its performance characteristics determined by [facility]. It has not been cleared or approved by the Food and Drug Administration. Per the College of American Pathologists (CAP) guidelines, a culture must be performed on all samples regardless of the molecular test result. By ordering this test code, the client has  assumed responsibility for the performance of the culture.    Rifampin Comment Susceptible Final    Comment: (NOTE) Because Mycobacterium tuberculosis complex (MTBC) was not detected, no rifampin determination is possible. This test was developed and its performance characteristics determined by [facility]. It has not been cleared or approved by the Food and Drug Administration.    AFB Specimen Processing Concentration  Final    Comment: (NOTE) Performed At: [facility] [place], [place] [id number] Rush Farmer MD BJ:[id number]      RN Pressure Injury Documentation:     Estimated body mass index is 20.88 kg/m as calculated from the following:   Height as of this encounter: 5\' 10"  (1.778 m).   Weight as of this encounter: 66 kg.  Malnutrition Type:  Nutrition Problem: Moderate Malnutrition Etiology: chronic illness   Malnutrition Characteristics:  Signs/Symptoms: mild fat depletion, moderate fat depletion, mild muscle depletion, moderate muscle depletion   Nutrition Interventions:  Interventions: Hormel Shake, Magic cup, MVI   Radiology Studies: No results found. Scheduled Meds: . amLODipine  10 mg Oral Daily  . aspirin  81 mg Oral Daily  . atorvastatin  80 mg Oral Daily  . Chlorhexidine Gluconate Cloth  6 each Topical Daily  . dexamethasone (DECADRON) injection  18 mg Intravenous Daily  . dextrose  10 mL Intravenous Q24H  . dextrose  10 mL Intravenous Q24H  . docusate  100 mg Oral BID  . ethambutol  1,200 mg Oral Daily  . fenofibrate  160 mg Oral Daily  . heparin  5,000 Units Subcutaneous Q8H  . isoniazid  300 mg Oral Daily  . levETIRAcetam  1,000 mg Oral Q12H  . metoprolol succinate  50 mg Oral Daily  . multivitamin with minerals  1 tablet Oral Daily  . pantoprazole sodium  40 mg Oral Daily  . polyethylene glycol  17 g Oral Daily  . potassium chloride  40 mEq Oral Daily  . pyrazinamide  1,500 mg Oral Daily  . vitamin B-6  50 mg  Oral Daily  . sodium chloride  750 mL Intravenous Q24H  . vitamin B-12  1,000 mcg Oral Daily   Continuous Infusions: . sodium chloride Stopped (02/24/20 0116)  . sodium chloride Stopped (02/24/20 0218)  . amphotericin  B  Liposome (AMBISOME) ADULT IV Stopped (03/03/20 0035)  .  dextrose 5% lactated ringers 20 mL/hr at 03/03/20 0317  . rifampin (RIFADIN) IVPB Stopped (03/02/20 1130)  . sodium chloride 750 mL (03/03/20 0052)    LOS: 19 days   Little Ishikawa, DO [facility] Pager: secure chat on epic  If 7PM-7AM, please contact night-coverage [email or web address]

## 2020-03-03 NOTE — Progress Notes (Signed)
Pharmacy Antibiotic Note  Howard Allen is a 74 y.o. 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 Thus far, no organism has been identified as a cause of infection.   ID plan for another repeat LP this week- Plavix discontinued.  CSF culture from 10/15 now with Candida albicans- sensitivities sent to [facility] (pending).  CSF studies for Coccidioides, Blastomyces, Histoplasma, and paraneoplastic panel are negative thus far.    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 Current SCr is increased at 1.46 (BL ~1.2-1.4). Will provide pre and post hydration of amphotericin B with 750 mL NS. Continue to watch renal function and further adjust NS if Scr continues to increase.   Potassium remains stable (4.7 today) and patient is currently on a daily potassium supplement. Mg today is 2.2- no supplementation required today.  AST/ALT are wnl as of 10/22 - will continue to monitor while on RIPE therapy.   Plan: Amphotericin B 310 mg (~ 5 mg/kg) every 24 hours  Give 750 mL of normal saline 1 hour prior to dose Flush line with dextrose in-between normal saline and amphotericin B doses Give another 750 mL normal saline after amphotericin B infusion is complete  Administration instructions reviewed with RN Daily K supplement Supplement Mg as needed F/u daily Scr, K, and Mg levels  Rifampin 600 mg daily Isoniazid 300 mg daily Pyridoxine 50 mg daily Pyrazinamide 1500 mg daily Ethambutol 1200 mg daily Dexamethasone IV 0.3 mg/kg (~18 mg) daily for 2 weeks- Will taper as appropriate pending results of CSF   Height: 5\' 10"  (177.8 cm) Weight: 66 kg (145 lb 8.1 oz) IBW/kg (Calculated) : 73  Temp (24hrs), Avg:97.5 F (36.4 C), Min:97.3 F (36.3 C), Max:97.6 F (36.4 C)  Recent Labs  Lab 02/28/20 0528 02/29/20 0824 03/01/20 0211 03/02/20 0252 03/03/20 0443  CREATININE 1.36* 1.42* 1.40* 1.44* 1.46*    Estimated  Creatinine Clearance: 42.7 mL/min (A) (by C-G formula based on SCr of 1.46 mg/dL (H)).    Allergies  Allergen Reactions  . Lisinopril Anaphylaxis, Cough and Other (See Comments)    Other reaction(s): anaphylaxis Pt states it made him "cough his head off"   . Lorazepam     Other reaction(s): Hallucinations    Alfonse Spruce, PharmD PGY2 ID Pharmacy Resident [phone]  03/03/2020 9:14 AM

## 2020-03-04 DIAGNOSIS — R634 Abnormal weight loss: Secondary | ICD-10-CM | POA: Diagnosis not present

## 2020-03-04 DIAGNOSIS — G40901 Epilepsy, unspecified, not intractable, with status epilepticus: Secondary | ICD-10-CM | POA: Diagnosis not present

## 2020-03-04 DIAGNOSIS — R569 Unspecified convulsions: Secondary | ICD-10-CM | POA: Diagnosis not present

## 2020-03-04 DIAGNOSIS — G030 Nonpyogenic meningitis: Secondary | ICD-10-CM | POA: Diagnosis not present

## 2020-03-04 LAB — GLUCOSE, CAPILLARY
Glucose-Capillary: 130 mg/dL — ABNORMAL HIGH (ref 70–99)
Glucose-Capillary: 136 mg/dL — ABNORMAL HIGH (ref 70–99)
Glucose-Capillary: 136 mg/dL — ABNORMAL HIGH (ref 70–99)
Glucose-Capillary: 159 mg/dL — ABNORMAL HIGH (ref 70–99)
Glucose-Capillary: 65 mg/dL — ABNORMAL LOW (ref 70–99)
Glucose-Capillary: 68 mg/dL — ABNORMAL LOW (ref 70–99)
Glucose-Capillary: 72 mg/dL (ref 70–99)
Glucose-Capillary: 74 mg/dL (ref 70–99)

## 2020-03-04 MED ORDER — NEPRO/CARBSTEADY PO LIQD
237.0000 mL | Freq: Three times a day (TID) | ORAL | Status: DC
  Administered 2020-03-04 – 2020-03-10 (×17): 237 mL via ORAL

## 2020-03-04 NOTE — Progress Notes (Signed)
PROGRESS NOTE    Howard Allen  LDJ:[id number] DOB: 02-21-1946 DOA: 02/13/2020 PCP: System, Provider Not In   Brief Narrative: The patient is a 74 year old Caucasian male with a past medical history significant for Crohn's disease, hypertension, hyperlipidemia, depression history of hospitalization in June 2021 for pneumatosis status post ileocolonic resection and lysis of adhesions who presented with multiple tonic-clonic seizures complicated by status epilepticus that required intubation and loading with Keppra. CT of the head was negative and initially when he was brought in to the ED by EMS there was at least 3 witnessed generalized tonic-clonic seizures noted. PCCM was consulted for admission given that he was intubated on arrival and unresponsive. He was intubated on 02/13/2020 and extubated 02/15/2020. Neurology was consulted and he underwent further work-up and was found to have a signal abnormality in the left limb is most consistent with an acute infarct. Echocardiogram done showed an EF of 55 to 60% with moderate LVH and grade 1 diastolic dysfunction. EEG testing on 10/9 was negative. 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 He underwent several viral serologies and had a lumbar puncture sent on 02/14/2020. He was started on IV vancomycin, IV ceftriaxone, IV ampicillin and IV acyclovir for suspected meningitis. CSF analysis was consistent with aseptic meningitis with high white blood cells and very low glucose level. CSF culture was initally negative. He was transferred to [facility] service on 02/19/2020 and remained confused. Subsequently ID was consulted and they were suspecting aseptic meningoencephalitis and recommended large volume LP. Patient was already on aspirin and Plavix for past 5 days. His Plavix was stopped on 02/20/2020. P2 Y 12 was checked and based on that, neurology performed another large-volume LP on 02/22/2020 and several labs were sent by ID. 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  Given negative findings thus far IV has discontinued RIPE therapy will continue fluconazole 800 mg p.o. daily for 7 more days to cover possible fungal infection.  No longer requiring further evaluation for lumbar puncture for labs per their expertise.  At this point given no further need for invasive work-up as previously considering LP patient is reasonably stable for discharge to skilled nursing facility due to ongoing need for acute care including speech, PT OT and assistance with ADLs given his ongoing mental status changes from baseline.  He is unsafe to discharge home alone.  Will discuss with family contact you whether or not we will transition to skilled nursing facility here versus in [place].  Her wishes.  Assessment & Plan:   Principal Problem:   Aseptic meningitis Active Problems:   Seizure ([place])   Weight loss   Encounter for nasogastric (NG) tube placement   Malnutrition of moderate degree   Aseptic meningitis - LP performed on 10/7 consistent with meningitis; repeat LP on 10/15 as well - LP Cultures positive for candida as of 02/27/20 - ID does not consider this to be an acute pathogen - Multiple cultures/antigen/labs negative - remainder of pending tests are Coccidioides Ab and Blastomyces Ag from the CSF (sent out) and the paraneoplastic panel that was sent to [facility] - Continue fluconazole 800mg  daily x 7 more days per ID; RIPE discontinued - Plavix on hold per ID for possible third LP - will likely restart at discharge in case repeat LP is necessary prior to then  Acute ischemic infarct  - Seen on MRI  imaging on 10/7: left thalamic signal abnormality measuring 2.3 cm most consistent with acute infarct; no edema or hemorrhagic conversion. (neuro suspects 2/2 prolonged seizure) - Will need outpt MRI in 8-12 weeks to help delineate  this -outpt neuro f/u as well - CTA head and neck without large vessel occlusion or proximal stenosis, small ICA aneurysm 2 mm, right upper pulmonary nodule 5 mm, recommend outpatient follow-up imaging per protocol. - Now on atorvastatin 80 mg despite that he takes 20 at home given his acute CVA - TTE negative for vegetations or thrombosis.  - Continue high intensity statin; Lipid panel done and showed a total cholesterol/HDL ratio 3.3, cholesterol level 154, HDL level 46, LDL 79, triglycerides 146, VLDL 29 - PT/OT recommending CIR - appreciate neurology evaluation/recommendations.  Currently on aspirin, Plavix on hold for possible repeat LP - see above  Tonic-Clonic Seizures, Status Epilepticus:  - Resolved - Secondary to meningitis - Neurology on board; appreciate their recommendations - Continue Keppra  Moderate to severe protein caloric malnutrition - Likely due to malnutrition and anorexia, possibly worsened by malabsorption given bowel inflammation seen on imaging the past several months prior to resection. Of note, 70 lb weight loss in the past 10 months - Consult to dietician -now off tube feeds, tolerating dysphagia 2 diet quite poorly, unable to feed himself -Patient continues to have moderate hypoglycemia essentially every morning at this point due to poor p.o. intake, initiate D5 LR 03/02/2020, continue to advance diet, discontinue IV fluids once tolerating p.o. safely. -Do not feel that this is a chronic condition and that PEG tube at this juncture is unnecessary however if patient continues to have worsening p.o. intake or is unable to feed himself appropriately this could be considered in the near future. -Patient remains off all insulin given transient episodes of hypoglycemia the past few mornings  Large hematoma above right eye, resolving  - He developed large hematoma on the morning of 02/22/2020 - Initially he was unable to open his eye initially - ENT consulted and he was  evaluated by them but they did not feel the need to evacuate hematoma, now resolving - Patient's hematoma is decreasing in size and he is able to open his eye without issues - will likely need stitches out prior to discharge.  Hypomagnesemia - Resolved  Hyperphosphatemia - Low PTH (14), CK low (22) - Sevelamer x1 dose (800mg ) - Previously low in the setting of malnutrition/refeeding syndrome - unclear why elevated - dietary following closely - no high phos in supplements - Likely compounded by AKI as below  HTN/HLD - Home medications include Toprol 100 mg, Amlodipine 5 mg, Lipitor 20 mg, Fenofibrate 160 mg.  Blood pressure very well controlled.  Will continue amlodipine to 10 mg and Toprol-XL 100 mg p.o. daily.   Normocytic Anemia  Hx of Iron Deficiency Anemia B12 Deficiency  - B12 levels low. Iron panel shows low saturation, however normal iron and TIBC, unclear picture as not consistent completely with iron deficiency anemia or anemia of chronic disease.  Hemoglobin is stable.  Questionable Crohn's Disease s/p Ileocolic Resection  - No longer on immunosuppressive therapy. - Takes budesonide as an outpatient -currently on hold  AKI on CKD stage II, stable - Slight jump in creatinine.   - Likely due to dehydration, DC fluids and increase PO intake as appropriate  Lab Results  Component Value Date   CREATININE 1.46 (H) 03/03/2020    DVT prophylaxis: SCDs Heparin 5000 units subcu every 8 hours Code  Status: FULL CODE  Family Communication: Daughter Threasa Beards updated over the phone Disposition Plan: Plan to discharge to CIR now cleared by ID and neurology.  Ultimate disposition is likely discharge back to [place] after CIR to either SNF versus home pending patient's clinical improvement.  Status is: Inpatient  Remains inpatient appropriate because:Altered mental status, Unsafe d/c plan, IV treatments appropriate due to intensity of illness or inability to take PO and Inpatient  level of care appropriate due to severity of illness  Dispo: The patient is from: Home              Anticipated d/c is to: CIR              Anticipated d/c date is: >4 days              Patient currently is not medically stable to d/c.  Consultants:   Neurology  Infectious disease  PCCM Transfer   Procedures:  10/6 >> ETT placed 10/7 >> LP 10/8 >> Extubated   Significant Diagnostic Tests:  10/6 CT Head >> No acute intracranial abnormalities; age-related volume loss and chronic small vessel disease 10/7 MRI brain: 1. Signal abnormality in the left thalamus most consistent with an acute infarct. 2. Signal abnormality in the mesial left temporal lobe and splenium of the corpus callosum which may reflect acute infarct, sequelae of recent seizure activity, or encephalitis (including herpes encephalitis). No contralateral cerebral involvement. 3. Mild chronic small vessel ischemic disease. 10/8 echo: LVEF 55-60%, mod LVH, Grade I diastolic dysfunction 70/2 CT Head >> Progressive low density in the left thalamus related to acute infarction; no hemorrhage 10/9 EEG >> Negative  Micro Data:  10/6 COVID-19 + influenza >> Negative  10/6 Blood Cultures >> NGTD 10/7 CSF Culture >> NGTD 10/7 HSV serology >> 2.02 on igg testing. Will send pcr to determine past or active.  10/7 West Nile serology >>  10/7 VRDL serology >>non reactive 10/8 Quanitferon Gold  >> indeterminant 10/9 acid fast csf: Pending 10/11 HSV PCR: IgG elevated 10/15 -CSF culture -Candida albicans  Antimicrobials:  Anti-infectives (From admission, onward)   Start     Dose/Rate Route Frequency Ordered Stop   03/04/20 1000  fluconazole (DIFLUCAN) tablet 800 mg        800 mg Oral Daily 03/03/20 1159     02/26/20 1115  rifampin (RIFADIN) 600 mg in sodium chloride 0.9 % 100 mL IVPB  Status:  Discontinued        600 mg 200 mL/hr over 30 Minutes Intravenous Every 24 hours 02/26/20 1026 03/03/20 1141   02/23/20 0100   acyclovir (ZOVIRAX) 615 mg in dextrose 5 % 100 mL IVPB  Status:  Discontinued        10 mg/kg  61.3 kg 112.3 mL/hr over 60 Minutes Intravenous Every 12 hours 02/22/20 1537 02/24/20 0936   02/22/20 2100  amphotericin B liposome (AMBISOME) 310 mg in dextrose 5 % 500 mL IVPB  Status:  Discontinued        5 mg/kg  61.3 kg 288.8 mL/hr over 120 Minutes Intravenous Every 24 hours 02/22/20 1524 03/03/20 1159   02/22/20 1800  rifampin (RIFADIN) capsule 600 mg  Status:  Discontinued        600 mg Oral Daily 02/22/20 1520 02/26/20 1026   02/22/20 1800  isoniazid (NYDRAZID) tablet 300 mg  Status:  Discontinued        300 mg Oral Daily 02/22/20 1520 03/03/20 1141   02/22/20 1800  pyrazinamide tablet 1,500  mg  Status:  Discontinued        1,500 mg Oral Daily 02/22/20 1520 03/03/20 1141   02/22/20 1800  ethambutol (MYAMBUTOL) tablet 1,200 mg  Status:  Discontinued        1,200 mg Oral Daily 02/22/20 1520 03/03/20 1141   02/20/20 1000  vancomycin (VANCOCIN) IVPB 1000 mg/200 mL premix  Status:  Discontinued        1,000 mg 200 mL/hr over 60 Minutes Intravenous Every 24 hours 02/20/20 0555 02/21/20 1259   02/19/20 1811  vancomycin variable dose per unstable renal function (pharmacist dosing)  Status:  Discontinued         Does not apply See admin instructions 02/19/20 1811 02/20/20 0555   02/18/20 0500  vancomycin (VANCOREADY) IVPB 750 mg/150 mL  Status:  Discontinued        750 mg 150 mL/hr over 60 Minutes Intravenous Every 12 hours 02/17/20 1651 02/19/20 1811   02/15/20 0300  vancomycin (VANCOREADY) IVPB 500 mg/100 mL        500 mg 100 mL/hr over 60 Minutes Intravenous Every 12 hours 02/14/20 1352 02/17/20 1755   02/14/20 1400  acyclovir (ZOVIRAX) 690 mg in dextrose 5 % 100 mL IVPB  Status:  Discontinued        10 mg/kg  68.9 kg 113.8 mL/hr over 60 Minutes Intravenous Every 12 hours 02/14/20 1245 02/22/20 1537   02/14/20 1400  vancomycin (VANCOREADY) IVPB 1250 mg/250 mL        1,250 mg 166.7 mL/hr  over 90 Minutes Intravenous STAT 02/14/20 1352 02/14/20 1655   02/14/20 1300  cefTRIAXone (ROCEPHIN) 2 g in sodium chloride 0.9 % 100 mL IVPB  Status:  Discontinued        2 g 200 mL/hr over 30 Minutes Intravenous Every 12 hours 02/14/20 1245 02/20/20 1732   02/14/20 1300  ampicillin (OMNIPEN) 2 g in sodium chloride 0.9 % 100 mL IVPB  Status:  Discontinued        2 g 300 mL/hr over 20 Minutes Intravenous Every 6 hours 02/14/20 1245 02/18/20 1051       Subjective: No acute issues or events overnight, patient awake alert oriented to person and general situation, correctly recalls his home city state, family members but has difficulty orienting to place and time.  Answers most questions with "I do not know" otherwise.  Review of systems somewhat limited.  Objective: Vitals:   03/03/20 1952 03/04/20 0024 03/04/20 0402 03/04/20 0500  BP: 125/62 140/76 (!) 149/65   Pulse: (!) 57 (!) 55 64   Resp:  17 19   Temp: 97.7 F (36.5 C) 97.8 F (36.6 C) 98.3 F (36.8 C)   TempSrc: Oral Oral Oral   SpO2: 100% 100% 100%   Weight:    62.9 kg  Height:        Intake/Output Summary (Last 24 hours) at 03/04/2020 0709 Last data filed at 03/03/2020 1745 Gross per 24 hour  Intake 120 ml  Output --  Net 120 ml   Filed Weights   03/01/20 0645 03/02/20 0333 03/04/20 0500  Weight: 63.8 kg 66 kg 62.9 kg   Examination:  General:  Pleasantly resting in bed, No acute distress.  Alert to person/general situation only HEENT: Moderate resolving ecchymosis/small hematoma over right lateral eye.  Stitches clean dry intact.  Sclerae nonicteric, noninjected.  Extraocular movements intact bilaterally. Neck:  Without mass or deformity.  Trachea is midline. Lungs:  Clear to auscultate bilaterally without rhonchi, wheeze, or rales. Heart:  Regular rate and rhythm.  Without murmurs, rubs, or gallops. Abdomen:  Soft, nontender, nondistended.  Without guarding or rebound. Extremities: Without cyanosis, clubbing,  edema, or obvious deformity. Vascular:  Dorsalis pedis and posterior tibial pulses palpable bilaterally. Skin:  Warm and dry, no erythema, no ulcerations.   Data Reviewed: I have personally reviewed following labs and imaging studies  CBC: No results for input(s): WBC, NEUTROABS, HGB, HCT, MCV, PLT in the last 168 hours. Basic Metabolic Panel: Recent Labs  Lab 02/27/20 0501 02/27/20 0501 02/28/20 0528 02/28/20 1413 02/29/20 0824 03/01/20 0211 03/02/20 0252 03/03/20 0443  NA 132*   < > 130*  --  131* 132* 130* 133*  K 4.9   < > 4.0  --  4.4 4.2 4.3 4.7  CL 105   < > 102  --  99 103 103 105  CO2 16*   < > 18*  --  20* 17* 19* 22  GLUCOSE 99   < > 134*  --  97 60* 95 85  BUN 33*   < > 33*  --  32* 34* 28* 28*  CREATININE 1.30*   < > 1.36*  --  1.42* 1.40* 1.44* 1.46*  CALCIUM 9.4   < > 9.3 9.8 9.5 9.1 8.9 9.1  MG 2.0   < > 1.8  --  2.0 1.9 1.9 2.2  PHOS 6.0*  --  6.6*  --   --   --  6.6*  --    < > = values in this interval not displayed.   GFR: Estimated Creatinine Clearance: 40.7 mL/min (A) (by C-G formula based on SCr of 1.46 mg/dL (H)). Liver Function Tests: Recent Labs  Lab 02/29/20 0824  AST 62*  ALT 33  ALKPHOS 46  BILITOT 0.7  PROT 5.9*  ALBUMIN 3.3*   No results for input(s): LIPASE, AMYLASE in the last 168 hours. No results for input(s): AMMONIA in the last 168 hours. Coagulation Profile: No results for input(s): INR, PROTIME in the last 168 hours. Cardiac Enzymes: Recent Labs  Lab 02/28/20 1413  CKTOTAL 22*   BNP (last 3 results) No results for input(s): PROBNP in the last 8760 hours. HbA1C: No results for input(s): HGBA1C in the last 72 hours. CBG: Recent Labs  Lab 03/03/20 1556 03/03/20 1950 03/04/20 0002 03/04/20 0331 03/04/20 0358  GLUCAP 115* 124* 72 65* 74   Lipid Profile: No results for input(s): CHOL, HDL, LDLCALC, TRIG, CHOLHDL, LDLDIRECT in the last 72 hours. Thyroid Function Tests: No results for input(s): TSH, T4TOTAL,  FREET4, T3FREE, THYROIDAB in the last 72 hours. Anemia Panel: No results for input(s): VITAMINB12, FOLATE, FERRITIN, TIBC, IRON, RETICCTPCT in the last 72 hours. Sepsis Labs: No results for input(s): PROCALCITON, LATICACIDVEN in the last 168 hours.  No results found for this or any previous visit (from the past 240 hour(s)).   RN Pressure Injury Documentation:     Estimated body mass index is 19.9 kg/m as calculated from the following:   Height as of this encounter: 5\' 10"  (1.778 m).   Weight as of this encounter: 62.9 kg.  Malnutrition Type:  Nutrition Problem: Moderate Malnutrition Etiology: chronic illness   Malnutrition Characteristics:  Signs/Symptoms: mild fat depletion, moderate fat depletion, mild muscle depletion, moderate muscle depletion   Nutrition Interventions:  Interventions: Hormel Shake, Magic cup, MVI   Radiology Studies: No results found. Scheduled Meds: . amLODipine  10 mg Oral Daily  . aspirin  81 mg Oral Daily  . atorvastatin  80  mg Oral Daily  . Chlorhexidine Gluconate Cloth  6 each Topical Daily  . dexamethasone (DECADRON) injection  18 mg Intravenous Daily  . docusate  100 mg Oral BID  . fenofibrate  160 mg Oral Daily  . fluconazole  800 mg Oral Daily  . heparin  5,000 Units Subcutaneous Q8H  . levETIRAcetam  1,000 mg Oral Q12H  . metoprolol succinate  50 mg Oral Daily  . multivitamin with minerals  1 tablet Oral Daily  . pantoprazole sodium  40 mg Oral Daily  . polyethylene glycol  17 g Oral Daily  . potassium chloride  40 mEq Oral Daily  . vitamin B-12  1,000 mcg Oral Daily   Continuous Infusions: . sodium chloride Stopped (02/24/20 0116)  . sodium chloride Stopped (02/24/20 0218)  . dextrose 5% lactated ringers 20 mL/hr at 03/03/20 4270    LOS: 20 days   Little Ishikawa, DO [facility] Pager: secure chat on epic  If 7PM-7AM, please contact night-coverage [email or web address]

## 2020-03-04 NOTE — Progress Notes (Signed)
Hypoglycemic Event  CBG: 65  Treatment: 4 oz juice/soda  Symptoms: None  Follow-up CBG: Time:0358 CBG Result:74  Possible Reasons for Event: Unknown  Comments/MD notified: Treated per protocol. Will continue to monitor    Howard Allen

## 2020-03-04 NOTE — Progress Notes (Signed)
Physical Therapy Treatment Patient Details Name: Howard Allen MRN: [id number] DOB: 11-23-1945 Today's Date: 03/04/2020    History of Present Illness Pt is a 74 year old male who was having multiple seizures and has a history of falls. 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 NIH 4 on 02/15/20 and then 14 on 02/17/20. Medical hx consisting of Chron's disease and HTN.    PT Comments    Pt demonstrating progression this day, ambulating room distance with min-mod assist from PT. Pt requires step-by-step cuing for mobility tasks, as he has difficulty with motor planning and sequencing. Pt tolerated repeated transfer training and standing exercise well, requiring intermittent seated rest breaks to recover fatigue. Will continue to progress mobility as tolerated .    Follow Up Recommendations  Supervision/Assistance - 24 hour;SNF     Equipment Recommendations  Rolling walker with 5" wheels;3in1 (PT);Wheelchair (measurements PT);Wheelchair cushion (measurements PT);Hospital bed (vs defer to next venue)    Recommendations for Other Services       Precautions / Restrictions Precautions Precautions: Fall Precaution Comments: seizures, condom cath    Mobility  Bed Mobility Overal bed mobility: Needs Assistance Bed Mobility: Supine to Sit     Supine to sit: Min assist;HOB elevated     General bed mobility comments: Min assist for trunk elevation via HHA, verbal cuing for scooting to EOB and sequencing.  Transfers Overall transfer level: Needs assistance Equipment used: 1 person hand held assist;Rolling walker (2  wheeled) Transfers: Sit to/from [facility] Sit to Stand: Min assist;Mod assist Stand pivot transfers: Mod assist       General transfer comment: Min assist for initial power up and steadying, requiring more like mod assist for power up with repeated transfers. Slow eccentric lower assist into recliner with VCs for hand placement.  Ambulation/Gait Ambulation/Gait assistance: Mod assist Gait Distance (Feet): 20 Feet Assistive device: Rolling walker (2 wheeled) Gait Pattern/deviations: Step-through pattern;Decreased stride length;Shuffle;Narrow base of support;Trunk flexed Gait velocity: decr   General Gait Details: Mod assist to steady, correct posterior leaning, guide RW. Verbal cuing for increased step length, upright posture, positioning in RW.   Stairs             Wheelchair Mobility    Modified Rankin (Stroke Patients Only) Modified Rankin (Stroke Patients Only) Pre-Morbid Rankin Score: No symptoms Modified Rankin: Moderately severe disability     Balance Overall balance assessment: History of Falls;Needs assistance Sitting-balance support: Single extremity supported;Feet supported Sitting balance-Leahy Scale: Fair     Standing balance support: Bilateral upper extremity supported Standing balance-Leahy Scale: Poor Standing balance comment: reliant on RW and PT assist                            Cognition Arousal/Alertness: Awake/alert Behavior During Therapy: Flat affect Overall Cognitive Status: Impaired/Different from baseline Area of Impairment: Attention;Memory;Following commands;Safety/judgement;Awareness;Problem solving;Orientation                 Orientation Level: Disoriented to;Place;Time;Situation Current Attention Level: Sustained Memory: Decreased recall of precautions;Decreased short-term memory Following Commands: Follows one step commands with increased time Safety/Judgement: Decreased  awareness of  safety;Decreased awareness of deficits Awareness: Intellectual Problem Solving: Slow processing;Decreased initiation;Difficulty sequencing;Requires verbal cues;Requires tactile cues General Comments: Pt oriented to self, cannot answer other orientation questions without multiple choice ("are you in [place] or [place]? are you at home or in the hospital?"). Answers correctly when given choices. Difficulty sequencing tasks, requires step-by-step commands for mobility tasks      Exercises Other Exercises Other Exercises: sit<>stand x5, requiring min-mod assist for power up, VCs for hand placement when rising/sitting Other Exercises: Standing marches x10, bilaterally Other Exercises: Standing toe taps on target placed ~8 inches in front of pt BOS, x10 target taps with bilateral feet. To encourage SL WB and increased step length during gait.    General Comments        Pertinent Vitals/Pain Pain Assessment: Faces Faces Pain Scale: No hurt Pain Intervention(s): Limited activity within patient's tolerance;Monitored during session    Home Living                      Prior Function            PT Goals (current goals can now be found in the care plan section) Acute Rehab PT Goals Patient Stated Goal: to go home PT Goal Formulation: With patient Time For Goal Achievement: 03/18/20 Potential to Achieve Goals: Good Progress towards PT goals: Progressing toward goals    Frequency    Min 3X/week      PT Plan Current plan remains appropriate    Co-evaluation              AM-PAC PT "6 Clicks" Mobility   Outcome Measure  Help needed turning from your back to your side while in a flat bed without using bedrails?: A Little Help needed moving from lying on your back to sitting on the side of a flat bed without using bedrails?: A Little Help needed moving to and from a bed to a chair (including a wheelchair)?: A Lot Help needed standing up from a chair using your arms  (e.g., wheelchair or bedside chair)?: A Little Help needed to walk in hospital room?: A Lot Help needed climbing 3-5 steps with a railing? : A Lot 6 Click Score: 15    End of Session Equipment Utilized During Treatment: Gait belt Activity Tolerance: Patient tolerated treatment well Patient left: in chair;with call bell/phone within reach;with chair alarm set Nurse Communication: Mobility status PT Visit Diagnosis: Unsteadiness on feet (R26.81);Other abnormalities of gait and mobility (R26.89);Muscle weakness (generalized) (M62.81);History of falling (Z91.81);Difficulty in walking, not elsewhere classified (R26.2);Other symptoms and signs involving the nervous system (N19.166)     Time: 0600-4599 PT Time Calculation (min) (ACUTE ONLY): 21 min  Charges:  $Therapeutic Activity: 8-22 mins                     Micole Delehanty E, PT Acute Rehabilitation Services Pager [phone]  Office [phone]    Mone Commisso D Isiaih Hollenbach 03/04/2020, 9:55 AM

## 2020-03-04 NOTE — NC FL2 (Signed)
[place] LEVEL OF CARE SCREENING TOOL     IDENTIFICATION  Patient Name: Howard Allen Birthdate: [birth year] Sex: male Admission Date (Current Location): 02/13/2020  [place] and [place] Number:   [place], [place])   Facility and Address:  [facility], [place] [address] [place], [place] [address]      Provider Number: [id number]  Attending Physician Name and Address:  Little Ishikawa, MD  Relative Name and Phone Number:       Current Level of Care: Hospital Recommended Level of Care: [place] Prior Approval Number:    Date Approved/Denied:   PASRR Number:    Discharge Plan: SNF    Current Diagnoses: Patient Active Problem List   Diagnosis Date Noted  . Malnutrition of moderate degree 02/29/2020  . Encounter for nasogastric (NG) tube placement   . Aseptic meningitis 02/21/2020  . Weight loss 02/21/2020  . Seizure ([place]) 02/13/2020  . Hypokalemia   . Status epilepticus ([place])   . Hypotension     Orientation RESPIRATION BLADDER Height & Weight     Self, Place  Normal Incontinent Weight: 138 lb 10.7 oz (62.9 kg) Height:  5\' 10"  (177.8 cm)  BEHAVIORAL SYMPTOMS/MOOD NEUROLOGICAL BOWEL NUTRITION STATUS      Incontinent Diet (mechanical soft)  AMBULATORY STATUS COMMUNICATION OF NEEDS Skin   Limited Assist Verbally Skin abrasions                       Personal Care Assistance Level of Assistance  Bathing, Feeding, Dressing Bathing Assistance: Limited assistance Feeding assistance: Independent Dressing Assistance: Limited assistance     Functional Limitations Info             SPECIAL CARE FACTORS FREQUENCY  PT (By licensed PT), OT (By licensed OT), Speech therapy     PT Frequency: 5x/wk OT Frequency: 5x/wk     Speech Therapy Frequency: 5x/wk      Contractures Contractures Info: Not present    Additional Factors Info  Code Status, Allergies Code Status Info: Full Allergies Info:  Lisinopril, Lorazepam           Current Medications (03/04/2020):  This is the current hospital active medication list Current Facility-Administered Medications  Medication Dose Route Frequency Provider Last Rate Last Admin  . 0.9 %  sodium chloride infusion  250 mL Intravenous Continuous Gleason, Otilio Carpen, PA-C   Stopped at 02/24/20 0116  . 0.9 %  sodium chloride infusion   Intravenous PRN Tommy Medal, Lavell Islam, MD   Stopped at 02/24/20 [phone]  . albuterol (PROVENTIL) (2.5 MG/3ML) 0.083% nebulizer solution 2.5 mg  2.5 mg Nebulization Q4H PRN Jose Persia, MD      . amLODipine (NORVASC) tablet 10 mg  10 mg Oral Daily Darliss Cheney, MD   10 mg at 03/04/20 0937  . aspirin chewable tablet 81 mg  81 mg Oral Daily Joselyn Glassman A, RPH   81 mg at 03/04/20 2836  . atorvastatin (LIPITOR) tablet 80 mg  80 mg Oral Daily Joselyn Glassman A, RPH   80 mg at 03/04/20 6294  . Chlorhexidine Gluconate Cloth 2 % PADS 6 each  6 each Topical Daily Gleason, Otilio Carpen, PA-C   6 each at 03/04/20 0949  . dexamethasone (DECADRON) injection 18 mg  18 mg Intravenous Daily Tommy Medal, Lavell Islam, MD   18 mg at 03/04/20 7654  . dextrose 5 % in lactated ringers infusion   Intravenous Continuous Holli Humbles  C, MD 20 mL/hr at 03/03/20 0317 New Bag at 03/03/20 0317  . docusate (COLACE) 50 MG/5ML liquid 100 mg  100 mg Oral BID PRN Joselyn Glassman A, RPH   100 mg at 02/24/20 2253  . docusate (COLACE) 50 MG/5ML liquid 100 mg  100 mg Oral BID Joselyn Glassman A, RPH   100 mg at 03/03/20 1000  . fenofibrate tablet 160 mg  160 mg Oral Daily Raiford Noble [place], [place]   160 mg at 03/04/20 0786  . fluconazole (DIFLUCAN) tablet 800 mg  800 mg Oral Daily Thayer Headings, MD   800 mg at 03/04/20 0936  . Gerhardt's butt cream   Topical PRN Little Ishikawa, MD   Given at 03/02/20 [phone]  . heparin injection 5,000 Units  5,000 Units Subcutaneous Q8H Gleason, Otilio Carpen, PA-C   5,000 Units at 03/04/20 9201  . labetalol (NORMODYNE) injection 5  mg  5 mg Intravenous Q2H PRN Jose Persia, MD      . levETIRAcetam (KEPPRA) tablet 1,000 mg  1,000 mg Oral Q12H Pierce, Dwayne A, RPH   1,000 mg at 03/04/20 0936  . metoprolol succinate (TOPROL-XL) 24 hr tablet 50 mg  50 mg Oral Daily Darliss Cheney, MD   50 mg at 03/04/20 0071  . multivitamin with minerals tablet 1 tablet  1 tablet Oral Daily Audria Nine, DO   1 tablet at 03/04/20 2197  . pantoprazole sodium (PROTONIX) 40 mg/20 mL oral suspension 40 mg  40 mg Oral Daily Joselyn Glassman A, RPH   40 mg at 03/04/20 5883  . polyethylene glycol (MIRALAX / GLYCOLAX) packet 17 g  17 g Oral Daily Joselyn Glassman A, RPH   17 g at 03/03/20 0956  . potassium chloride SA (KLOR-CON) CR tablet 40 mEq  40 mEq Oral Daily Joselyn Glassman A, RPH   40 mEq at 03/04/20 2549  . Resource ThickenUp Clear   Oral PRN Darliss Cheney, MD      . vitamin B-12 (CYANOCOBALAMIN) tablet 1,000 mcg  1,000 mcg Oral Daily Joselyn Glassman A, RPH   1,000 mcg at 03/04/20 8264     Discharge Medications: Please see discharge summary for a list of discharge medications.  Relevant Imaging Results:  Relevant Lab Results:   Additional Information SS#: [id number]  Geralynn Ochs, LCSW

## 2020-03-04 NOTE — Progress Notes (Signed)
  Speech Language Pathology Treatment: Dysphagia;Cognitive-Linquistic  Patient Details Name: Howard Allen MRN: [id number] DOB: 1946-01-16 Today's Date: 03/04/2020 Time: 6415-8309 SLP Time Calculation (min) (ACUTE ONLY): 23 min  Assessment / Plan / Recommendation Clinical Impression  Continued intervention for dysphagia and cognition. Assessed with thin liquids via cup and straw since recent diet upgrade to thin liquids. Pt still requiring support for use of chin tuck with thin liquids, unable to independently implement (due to deficits in recall), however with verbal cues able to successfully implement. Without chin tuck, overt cough exhibited in 1/3 trials with thin liquids concerning for decreased airway protection. SLP reinforced strategies for improved orientation, safety awareness, and problem solving with tasks in environment. Pt will need 24 hour supervision post acute stay. SLP to continue to follow.     HPI HPI: 74 y.o. 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        SLP Plan  Continue with current plan of care       Recommendations  Diet recommendations: Dysphagia 3 (mechanical soft);Thin liquid Liquids provided via: Cup Medication Administration: Crushed with puree Supervision: Patient able to self feed;Full supervision/cueing for compensatory strategies;Staff to assist with self feeding Compensations: Minimize environmental distractions;Slow rate;Small sips/bites;Clear throat intermittently Postural Changes and/or Swallow Maneuvers: Seated upright 90 degrees;Out of bed for meals                Oral Care Recommendations: Oral care BID Follow up  Recommendations: Skilled Nursing facility SLP Visit Diagnosis: Dysphagia, oropharyngeal phase (R13.12);Cognitive communication deficit (R41.841) Plan: Continue with current plan of care       [place] MA, CCC-SLP  Acute Rehabilitation Services  03/04/2020, 10:44 AM

## 2020-03-04 NOTE — Progress Notes (Addendum)
Nutrition Follow-up  DOCUMENTATION CODES:   Non-severe (moderate) malnutrition in context of chronic illness  INTERVENTION:   Recommend obtaining updated phosphorus level and, if still elevated, recommend initiation of phosphorus binder   D/c Hormel shakes   D/c Magic Cup  Nepro Shake po TID, each supplement provides 425 kcal and 19 grams protein  NUTRITION DIAGNOSIS:   Moderate Malnutrition related to chronic illness as evidenced by mild fat depletion, moderate fat depletion, mild muscle depletion, moderate muscle depletion.  Ongoing  GOAL:   Patient will meet greater than or equal to 90% of their needs   Progressing  MONITOR:   PO intake, Supplement acceptance, Labs, Weight trends  REASON FOR ASSESSMENT:   Consult Assessment of nutrition requirement/status (Weight in January 2021 was 196 lbs. Gradual weight loss over the last 10 months due to lack of appetite.)  ASSESSMENT:   74 yo male presents with 1 week of falls, hallucinations and admitted with recurrent seizures and status epilepticus requiring intubation. PMH includes hx of Crohn's, prior SBO, HTN, HLD, depression  10/7 LP consistent with meningitis 10/8 extubated 10/11 diet advanced to dysphagia 2 with honey thick liquids  10/15 large volume LP positive for candida  10/21 diet advanced to dysphagia 3 with nectar thick liquids 10/25 diet advanced to dysphagia 3 with thin liquids  Limited meal documentation since last RD assessment -- 3 meals recorded as 25%, 10%, 25% completion. Noted that MD does not feel pt is a good candidate for PEG at this time but could reconsider in the near future if intake does not improve. Now that pt's diet has been advanced to thin liquids, will d/c Hormel shakes and order Nepro for pt which pts typically consume better. If pt's po intake does not improve, recommend initiation of enteral nutrition.   Of note, pt's phosphorus continues to be elevated, but the most recent lab is  from 10/24. Recommend obtaining new results and if still elevated recommend initiation of phosphorus binder. Discussed with MD.   Labs: Na 133 (L), PO4 6.6 (H, lab is from 10/24), CBGs 65-74-68  Medications: decadron, colace, mvi, protonix, miralax, klor-con, vitamin B12 IVF: NS 10-29ml/hr, D5 in LR @ 34ml/hr  Diet Order:   Diet Order            DIET DYS 3 Room service appropriate? Yes with Assist; Fluid consistency: Thin  Diet effective now                 EDUCATION NEEDS:   No education needs have been identified at this time  Skin:  Skin Assessment: Skin Integrity Issues: Skin Integrity Issues:: Incisions, Other (Comment) Incisions: face Other: skin tear L arm  Last BM:  10/25  Height:   Ht Readings from Last 1 Encounters:  02/13/20 5\' 10"  (1.778 m)    Weight:   Wt Readings from Last 1 Encounters:  03/04/20 62.9 kg   BMI:  Body mass index is 19.9 kg/m.  Estimated Nutritional Needs:   Kcal:  1785-1965 kcal  Protein:  90-100 grams  Fluid:  >/= 1.8 L    Larkin Ina, MS, RD, LDN RD pager number and weekend/on-call pager number located in [place].

## 2020-03-04 NOTE — TOC Progression Note (Addendum)
Transition of Care [facility]) - Progression Note    Patient Details  Name: Howard Allen MRN: [id number] Date of Birth: May 22, 1945  Transition of Care [facility]) CM/SW [place], [place] Phone Number: 03/04/2020, 3:43 PM  Clinical Narrative:   CSW spoke with patient's daughter, Threasa Beards, to update her that the patient is now ready to begin discharge discussions. Threasa Beards would like the patient to go to [facility] in [place] [phone]). CSW reached out to [facility] to provide referral, left a voicemail for Livingston Wheeler in Admissions, awaiting call back. Melanie asked about timing, and discussed when family would come back and get the patient to take him to [place]. Family could be available Friday vs Saturday for transportation. CSW to continue to follow.    Expected Discharge Plan: [place] Barriers to Discharge: Continued Medical Work up  Expected Discharge Plan and Services Expected Discharge Plan: [place] Acute Care Choice: IP Rehab Living arrangements for the past 2 months: Single Family Home                                       Social Determinants of Health (SDOH) Interventions    Readmission Risk Interventions No flowsheet data found.

## 2020-03-05 DIAGNOSIS — G030 Nonpyogenic meningitis: Secondary | ICD-10-CM | POA: Diagnosis not present

## 2020-03-05 LAB — GLUCOSE, CAPILLARY
Glucose-Capillary: 109 mg/dL — ABNORMAL HIGH (ref 70–99)
Glucose-Capillary: 110 mg/dL — ABNORMAL HIGH (ref 70–99)
Glucose-Capillary: 161 mg/dL — ABNORMAL HIGH (ref 70–99)
Glucose-Capillary: 83 mg/dL (ref 70–99)
Glucose-Capillary: 85 mg/dL (ref 70–99)
Glucose-Capillary: 96 mg/dL (ref 70–99)

## 2020-03-05 LAB — PHOSPHORUS: Phosphorus: 3.7 mg/dL (ref 2.5–4.6)

## 2020-03-05 NOTE — Progress Notes (Signed)
Physical Therapy Treatment Patient Details Name: Howard Allen MRN: [id number] DOB: 07-14-1945 Today's Date: 03/05/2020    History of Present Illness Pt is a 74 year old male who was having multiple seizures and has a history of falls. 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 NIH 4 on 02/15/20 and then 14 on 02/17/20. Medical hx consisting of Chron's disease and HTN.    PT Comments    Pt demonstrating progression with mobility today, ambulating in hallway with use of RW and PT assist for balance, weight shifting L/R, and maneuvering RW. Pt tolerated repeated transfers and LE exercise to improve pt strength and step length during gait. PT to continue to follow acutely, SNF remains appropriate d/c plan.     Follow Up Recommendations  Supervision/Assistance - 24 hour;SNF     Equipment Recommendations  Rolling walker with 5" wheels;3in1 (PT);Wheelchair (measurements PT);Wheelchair cushion (measurements PT);Hospital bed (vs defer to next venue)    Recommendations for Other Services       Precautions / Restrictions Precautions Precautions: Fall Precaution Comments: seizures, condom cath Restrictions Weight Bearing Restrictions: No    Mobility  Bed Mobility Overal bed mobility: Needs Assistance Bed Mobility: Supine to Sit     Supine to sit: Min assist     General bed mobility comments: Min assist for trunk elevation, scooting to EOB with use of bedrails to perform. Increased time and effort.  Transfers Overall transfer level: Needs assistance Equipment used: Rolling walker (2 wheeled) Transfers: Sit to/from  Stand Sit to Stand: Min assist;Mod assist         General transfer comment: Initially min assist, transitioning to mod assist with repeated sit<>stands throughout session. Assist for initial power up, rise, and max multimodal cuing for safe hand placement each time pt performed stand or sit. STS x4 throughout session.  Ambulation/Gait Ambulation/Gait assistance: Min assist Gait Distance (Feet): 75 Feet (+25) Assistive device: Rolling walker (2 wheeled) Gait Pattern/deviations: Step-through pattern;Decreased stride length;Shuffle;Narrow base of support;Trunk flexed Gait velocity: decr   General Gait Details: Min assist to steady, guide RW during directional changes, weight shifting L and R to progress steps, and progress RLE at times. Verbal cuing for upright posture, improving step length bilaterally.   Stairs             Wheelchair Mobility    Modified Rankin (Stroke Patients Only) Modified Rankin (Stroke Patients Only) Pre-Morbid Rankin Score: No symptoms Modified Rankin: Moderately severe disability     Balance Overall balance assessment: History of Falls;Needs assistance Sitting-balance support: Single extremity supported;Feet supported Sitting balance-Leahy Scale: Fair     Standing balance support: Bilateral upper extremity supported Standing balance-Leahy Scale: Poor Standing balance comment: reliant on RW and PT assist                            Cognition Arousal/Alertness: Awake/alert Behavior During Therapy: Flat affect Overall Cognitive Status: Impaired/Different from baseline Area of Impairment: Attention;Memory;Following commands;Safety/judgement;Awareness;Problem solving                   Current Attention Level: Selective Memory: Decreased recall of precautions;Decreased  short-term memory Following Commands: Follows one step commands with increased time Safety/Judgement: Decreased awareness of safety;Decreased awareness of  deficits Awareness: Intellectual Problem Solving: Slow processing;Decreased initiation;Difficulty sequencing;Requires verbal cues;Requires tactile cues General Comments: pt oriented to self, stating "I want to go home" and answers "yes" when PT claified pt is from [place]. Follows one-step commands consistently, benefits from repeated cuing especially for sequencing tasks.      Exercises Other Exercises Other Exercises: Standing toe taps on target placed ~8 inches in front of pt BOS, x10 target taps with bilateral feet. x2 sets, PT facilitating weight shift and forward progression RLE. To encourage SL WB and increased step length during gait.    General Comments        Pertinent Vitals/Pain Pain Assessment: Faces Faces Pain Scale: No hurt Pain Intervention(s): Limited activity within patient's tolerance;Monitored during session    Home Living                      Prior Function            PT Goals (current goals can now be found in the care plan section) Acute Rehab PT Goals Patient Stated Goal: to go home PT Goal Formulation: With patient Time For Goal Achievement: 03/18/20 Potential to Achieve Goals: Good Progress towards PT goals: Progressing toward goals    Frequency    Min 3X/week      PT Plan Current plan remains appropriate    Co-evaluation              AM-PAC PT "6 Clicks" Mobility   Outcome Measure  Help needed turning from your back to your side while in a flat bed without using bedrails?: A Little Help needed moving from lying on your back to sitting on the side of a flat bed without using bedrails?: A Little Help needed moving to and from a bed to a chair (including a wheelchair)?: A Little Help needed standing up from a chair using your arms (e.g., wheelchair or bedside chair)?: A Little Help needed to walk in hospital room?: A Lot Help needed climbing 3-5 steps with a railing? : A Lot 6 Click Score: 16    End of Session Equipment  Utilized During Treatment: Gait belt Activity Tolerance: Patient tolerated treatment well;Patient limited by fatigue Patient left: in chair;with call bell/phone within reach;with chair alarm set;with nursing/sitter in room Nurse Communication: Mobility status PT Visit Diagnosis: Unsteadiness on feet (R26.81);Other abnormalities of gait and mobility (R26.89);Muscle weakness (generalized) (M62.81);History of falling (Z91.81);Difficulty in walking, not elsewhere classified (R26.2);Other symptoms and signs involving the nervous system (R29.898)     Time: 2637-8588 PT Time Calculation (min) (ACUTE ONLY): 23 min  Charges:  $Gait Training: 8-22 mins $Therapeutic Activity: 8-22 mins                     Merrilyn Legler E, PT Acute Rehabilitation Services Pager [phone]  Office [phone]    Mala Gibbard D Elonda Husky 03/05/2020, 12:46 PM

## 2020-03-05 NOTE — Progress Notes (Signed)
PROGRESS NOTE    Howard Allen  FGH:[id number] DOB: 1946/03/22 DOA: 02/13/2020 PCP: System, Provider Not In   Brief Narrative:  74 year old Caucasian male with a past medical history significant for Crohn's disease, hypertension, hyperlipidemia, depression history of hospitalization in June 2021 for pneumatosis status post ileocolonic resection and lysis of adhesions who presented with multiple tonic-clonic seizures complicated by status epilepticus that required intubation and loading with Keppra. CT of the head was negative and initially when he was brought in to the ED by EMS there was at least 3 witnessed generalized tonic-clonic seizures noted. PCCM was consulted for admission given that he was intubated on arrival and unresponsive. He was intubated on 02/13/2020 and extubated 02/15/2020. Neurology was consulted and he underwent further work-up and was found to have a signal abnormality in the left limb is most consistent with an acute infarct. Echocardiogram done showed an EF of 55 to 60% with moderate LVH and grade 1 diastolic dysfunction. EEG testing on 10/9 was negative. 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 He underwent several viral serologies and had a lumbar puncture sent on 02/14/2020. He was started on IV vancomycin, IV ceftriaxone, IV ampicillin and IV acyclovir for suspected meningitis. CSF analysis was consistent with aseptic meningitis with high white blood cells and very low glucose level. CSF culture was initally negative. He was transferred to [facility] service on 02/19/2020 and remained confused. Subsequently ID was consulted and they were suspecting aseptic meningoencephalitis and recommended large volume LP. Patient was already on aspirin and Plavix for past 5 days. His Plavix was stopped on 02/20/2020. P2 Y 12 was checked and based on that, neurology performed another large-volume LP on 02/22/2020 and several labs were sent by ID. 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  Given negative findings thus far IV has discontinued RIPE therapy will continue fluconazole 800 mg p.o. daily for 7 more days to cover possible fungal infection.  No longer requiring further evaluation for lumbar puncture for labs per their expertise.  At this point given no further need for invasive work-up as previously considering LP patient is reasonably stable for discharge to skilled nursing facility due to ongoing need for acute care including speech, PT OT and assistance with ADLs given his ongoing mental status changes from baseline.  PT is recommending SNF, TOC team working with family to get him transferred over to an SNF in [place].   Assessment & Plan:   Principal Problem:   Aseptic meningitis Active Problems:   Seizure ([place])   Weight loss   Encounter for nasogastric (NG) tube placement   Malnutrition of moderate degree   Aseptic meningitis -Multiple lumbar punctures performed.  Concerns for possible aseptic meningitis.  Initially treated with RIPE which was discontinued.  Plans to continue oral fluconazole until 03/11/2020. -Most of the work-up was negative except some labs are still pending which are send out including coccidiosis antibody, Blastomyces antigen and paraneoplastic panel -Seen by neurology and infectious disease.  Acute ischemic infarct, left thalamic infarct -Acute infarct seen on MRI.  CTA head and neck did not show any acute large vessel occlusion.  Now on Lipitor 80 mg daily.  Currently on aspirin, Plavix on hold in case patient needs repeat LP but will be resumed upon discharge. PT recommending SNF  5 mm pulmonary nodule, right upper lobe -Follow-up outpatient with PCP  Right  eye hematoma -Previously seen by ENT, no need for further intervention at this time.  Medical management.  Tonic-Clonic Seizures, Status  Epilepticus:  -Keppra 1000 mg twice daily  Moderate to severe protein caloric malnutrition -Encourage p.o. intake  Hypomagnesemia - Resolved  Hyperphosphatemia - Low PTH (14), CK low (22) - Sevelamer x1 dose (800mg ) - Previously low in the setting of malnutrition/refeeding syndrome - unclear why elevated - dietary following closely - no high phos in supplements - Likely compounded by AKI as below  HTN/HLD -Continue current meds  Normocytic Anemia Hx of Iron Deficiency Anemia B12 Deficiency  -B12 level slightly low, iron studies normal -Continue B12 supplements  Questionable Crohn's Disease s/p Ileocolic Resection - No longer on immunosuppressive therapy. - Takes budesonide as an outpatient -currently on hold  AKI on CKD stage II, stable -Stable closely monitor   DVT prophylaxis: Subcu heparin Code Status: Full code Family Communication:    Status is: Inpatient  Remains inpatient appropriate because:Inpatient level of care appropriate due to severity of illness   Dispo: The patient is from: Home              Anticipated d/c is to: SNF              Anticipated d/c date is: 2 days              Patient currently is not medically stable to d/c.  Currently awaiting SNF placement, family able to transfer the patient to [place] on Friday       Body mass index is 19.9 kg/m.      Subjective: Feels okay no complaints  Review of Systems Otherwise negative except as per HPI, including: General: Denies fever, chills, night sweats or unintended weight loss. Resp: Denies cough, wheezing, shortness of breath. Cardiac: Denies chest pain, palpitations, orthopnea, paroxysmal nocturnal dyspnea. GI: Denies abdominal pain, nausea, vomiting, diarrhea or constipation GU: Denies dysuria, frequency, hesitancy or incontinence MS: Denies muscle aches, joint pain or swelling Neuro: Denies headache, neurologic deficits (focal weakness, numbness, tingling), abnormal  gait Psych: Denies anxiety, depression, SI/HI/AVH Skin: Denies new rashes or lesions ID: Denies sick contacts, exotic exposures, travel  Examination:  General exam: Appears calm and comfortable  Respiratory system: Clear to auscultation. Respiratory effort normal. Cardiovascular system: S1 & S2 heard, RRR. No JVD, murmurs, rubs, gallops or clicks. No pedal edema. Gastrointestinal system: Abdomen is nondistended, soft and nontender. No organomegaly or masses felt. Normal bowel sounds heard. Central nervous system: Alert and oriented. No focal neurological deficits. Extremities: Symmetric 5 x 5 power. Skin: Right hematoma with stitches noted Psychiatry: Judgement and insight appear normal. Mood & affect appropriate.     Objective: Vitals:   03/05/20 0438 03/05/20 0500 03/05/20 0749 03/05/20 1119  BP: 124/64  114/64 119/77  Pulse: (!) 58  (!) 58 84  Resp: 17  20 20   Temp: 97.9 F (36.6 C)  (!) 97.5 F (36.4 C) (!) 97.3 F (36.3 C)  TempSrc: Oral  Oral Oral  SpO2: 100%  100% 100%  Weight:  62.9 kg    Height:        Intake/Output Summary (Last 24 hours) at 03/05/2020 1142 Last data filed at 03/05/2020 0953 Gross per 24 hour  Intake 360 ml  Output 1150 ml  Net -790 ml   Filed Weights   03/02/20 0333 03/04/20 0500 03/05/20 0500  Weight: 66 kg 62.9 kg 62.9 kg     Data Reviewed:   CBC: No results for input(s): WBC,  NEUTROABS, HGB, HCT, MCV, PLT in the last 168 hours. Basic Metabolic Panel: Recent Labs  Lab 02/28/20 0528 02/28/20 1413 02/29/20 0824 03/01/20 0211 03/02/20 0252 03/03/20 0443 03/05/20 0413  NA 130*  --  131* 132* 130* 133*  --   K 4.0  --  4.4 4.2 4.3 4.7  --   CL 102  --  99 103 103 105  --   CO2 18*  --  20* 17* 19* 22  --   GLUCOSE 134*  --  97 60* 95 85  --   BUN 33*  --  32* 34* 28* 28*  --   CREATININE 1.36*  --  1.42* 1.40* 1.44* 1.46*  --   CALCIUM 9.3 9.8 9.5 9.1 8.9 9.1  --   MG 1.8  --  2.0 1.9 1.9 2.2  --   PHOS 6.6*  --   --   --   6.6*  --  3.7   GFR: Estimated Creatinine Clearance: 40.7 mL/min (A) (by C-G formula based on SCr of 1.46 mg/dL (H)). Liver Function Tests: Recent Labs  Lab 02/29/20 0824  AST 62*  ALT 33  ALKPHOS 46  BILITOT 0.7  PROT 5.9*  ALBUMIN 3.3*   No results for input(s): LIPASE, AMYLASE in the last 168 hours. No results for input(s): AMMONIA in the last 168 hours. Coagulation Profile: No results for input(s): INR, PROTIME in the last 168 hours. Cardiac Enzymes: Recent Labs  Lab 02/28/20 1413  CKTOTAL 22*   BNP (last 3 results) No results for input(s): PROBNP in the last 8760 hours. HbA1C: No results for input(s): HGBA1C in the last 72 hours. CBG: Recent Labs  Lab 03/04/20 2013 03/04/20 2354 03/05/20 0436 03/05/20 0752 03/05/20 1121  GLUCAP 136* 136* 161* 83 109*   Lipid Profile: No results for input(s): CHOL, HDL, LDLCALC, TRIG, CHOLHDL, LDLDIRECT in the last 72 hours. Thyroid Function Tests: No results for input(s): TSH, T4TOTAL, FREET4, T3FREE, THYROIDAB in the last 72 hours. Anemia Panel: No results for input(s): VITAMINB12, FOLATE, FERRITIN, TIBC, IRON, RETICCTPCT in the last 72 hours. Sepsis Labs: No results for input(s): PROCALCITON, LATICACIDVEN in the last 168 hours.  No results found for this or any previous visit (from the past 240 hour(s)).       Radiology Studies: No results found.      Scheduled Meds: . amLODipine  10 mg Oral Daily  . aspirin  81 mg Oral Daily  . atorvastatin  80 mg Oral Daily  . Chlorhexidine Gluconate Cloth  6 each Topical Daily  . dexamethasone (DECADRON) injection  18 mg Intravenous Daily  . docusate  100 mg Oral BID  . feeding supplement (NEPRO CARB STEADY)  237 mL Oral TID BM  . fenofibrate  160 mg Oral Daily  . fluconazole  800 mg Oral Daily  . heparin  5,000 Units Subcutaneous Q8H  . levETIRAcetam  1,000 mg Oral Q12H  . metoprolol succinate  50 mg Oral Daily  . multivitamin with minerals  1 tablet Oral Daily  .  pantoprazole sodium  40 mg Oral Daily  . polyethylene glycol  17 g Oral Daily  . potassium chloride  40 mEq Oral Daily  . vitamin B-12  1,000 mcg Oral Daily   Continuous Infusions: . sodium chloride Stopped (02/24/20 0116)  . sodium chloride Stopped (02/24/20 0218)  . dextrose 5% lactated ringers 20 mL/hr at 03/03/20 0317     LOS: 21 days   Time spent= 35 mins  Mallisa Alameda Arsenio Loader, MD [facility]  If 7PM-7AM, please contact night-coverage  03/05/2020, 11:42 AM

## 2020-03-05 NOTE — Progress Notes (Signed)
    [place] for Infectious Disease   Reason for visit: Follow up on meningitis  Interval History: no new positive findings  Physical Exam: Constitutional:  Vitals:   03/05/20 0438 03/05/20 0749  BP: 124/64 114/64  Pulse: (!) 58 (!) 58  Resp: 17 20  Temp: 97.9 F (36.6 C) (!) 97.5 F (36.4 C)  SpO2: 100% 100%     Impression: infectious vs non-infectious meningitis. No positive infectious findings to date.   Plan: 1.  No changes, transferring/discharging to [place] and will need follow up with neurology there, follow up on paraneoplastic panel, etc...  I will sign off, call with questions.

## 2020-03-05 NOTE — TOC Progression Note (Signed)
Transition of Care [facility]) - Progression Note    Patient Details  Name: Howard Allen MRN: [id number] Date of Birth: [birth year]  Transition of Care [facility]) CM/SW [place], [place] Phone Number: 03/05/2020, 11:04 AM  Clinical Narrative:   CSW received call back from [place] at [facility], gave fax number of [phone]. CSW faxed referral to [facility] for review. CSW left a message for Mortimer Fries to call back after review. CSW to follow.    Expected Discharge Plan: [place] Barriers to Discharge: Continued Medical Work up  Expected Discharge Plan and Services Expected Discharge Plan: [place] Acute Care Choice: IP Rehab Living arrangements for the past 2 months: Single Family Home                                       Social Determinants of Health (SDOH) Interventions    Readmission Risk Interventions No flowsheet data found.

## 2020-03-05 NOTE — Progress Notes (Signed)
CSW received a phone call from [facility] SNF, unable to accept pt due to medical needs. CSW reached out to daughter Threasa Beards and updated her. Threasa Beards will speak with her brother and explore other options. Threasa Beards stated she would update CSW tomorrow. Threasa Beards also asked for an update of pt's status. CSW messaged both MD and RN.

## 2020-03-06 DIAGNOSIS — G030 Nonpyogenic meningitis: Secondary | ICD-10-CM | POA: Diagnosis not present

## 2020-03-06 LAB — GLUCOSE, CAPILLARY
Glucose-Capillary: 35 mg/dL — CL (ref 70–99)
Glucose-Capillary: 77 mg/dL (ref 70–99)
Glucose-Capillary: 81 mg/dL (ref 70–99)
Glucose-Capillary: 127 mg/dL — ABNORMAL HIGH (ref 70–99)
Glucose-Capillary: 139 mg/dL — ABNORMAL HIGH (ref 70–99)
Glucose-Capillary: 155 mg/dL — ABNORMAL HIGH (ref 70–99)

## 2020-03-06 LAB — BASIC METABOLIC PANEL
Anion gap: 9 (ref 5–15)
BUN: 38 mg/dL — ABNORMAL HIGH (ref 8–23)
CO2: 21 mmol/L — ABNORMAL LOW (ref 22–32)
Calcium: 9.3 mg/dL (ref 8.9–10.3)
Chloride: 106 mmol/L (ref 98–111)
Creatinine, Ser: 1.75 mg/dL — ABNORMAL HIGH (ref 0.61–1.24)
GFR, Estimated: 41 mL/min — ABNORMAL LOW (ref >60)
Glucose, Bld: 100 mg/dL — ABNORMAL HIGH (ref 70–99)
Potassium: 4.3 mmol/L (ref 3.5–5.1)
Sodium: 136 mmol/L (ref 135–145)

## 2020-03-06 LAB — MISC LABCORP TEST (SEND OUT)
LabCorp test name: 9
Labcorp test code: 183119
Source (LabCorp): 9

## 2020-03-06 LAB — BRAIN NATRIURETIC PEPTIDE: B Natriuretic Peptide: 119.1 pg/mL — ABNORMAL HIGH (ref 0.0–100.0)

## 2020-03-06 LAB — MAGNESIUM: Magnesium: 1.3 mg/dL — ABNORMAL LOW (ref 1.7–2.4)

## 2020-03-06 MED ORDER — MAGNESIUM SULFATE 4 GM/100ML IV SOLN
4.0000 g | Freq: Once | INTRAVENOUS | Status: AC
  Administered 2020-03-06: 4 g via INTRAVENOUS
  Filled 2020-03-06: qty 100

## 2020-03-06 MED ORDER — SODIUM CHLORIDE 0.9 % IV SOLN: INTRAVENOUS | Status: AC

## 2020-03-06 NOTE — Progress Notes (Signed)
PROGRESS NOTE    Lemonte Al  TIR:[id number] DOB: 12-06-1945 DOA: 02/13/2020 PCP: System, Provider Not In   Brief Narrative:  74 year old Caucasian male with a past medical history significant for Crohn's disease, hypertension, hyperlipidemia, depression history of hospitalization in June 2021 for pneumatosis status post ileocolonic resection and lysis of adhesions who presented with multiple tonic-clonic seizures complicated by status epilepticus that required intubation and loading with Keppra. CT of the head was negative and initially when he was brought in to the ED by EMS there was at least 3 witnessed generalized tonic-clonic seizures noted. PCCM was consulted for admission given that he was intubated on arrival and unresponsive. He was intubated on 02/13/2020 and extubated 02/15/2020. Neurology was consulted and he underwent further work-up and was found to have a signal abnormality in the left limb is most consistent with an acute infarct. Echocardiogram done showed an EF of 55 to 60% with moderate LVH and grade 1 diastolic dysfunction. EEG testing on 10/9 was negative. 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 He underwent several viral serologies and had a lumbar puncture sent on 02/14/2020. He was started on IV vancomycin, IV ceftriaxone, IV ampicillin and IV acyclovir for suspected meningitis. CSF analysis was consistent with aseptic meningitis with high white blood cells and very low glucose level. CSF culture was initally negative. He was transferred to [facility] service on 02/19/2020 and remained confused. Subsequently ID was consulted and they were suspecting aseptic meningoencephalitis and recommended large volume LP. Patient was already on aspirin and Plavix for past 5 days. His Plavix was stopped on 02/20/2020. P2 Y 12 was checked and based on that, neurology performed another large-volume LP on 02/22/2020 and several labs were sent by ID. 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  Given negative findings thus far IV has discontinued RIPE therapy will continue fluconazole 800 mg p.o. daily for 7 more days to cover possible fungal infection.  No longer requiring further evaluation for lumbar puncture for labs per their expertise.  At this point given no further need for invasive work-up as previously considering LP patient is reasonably stable for discharge to skilled nursing facility due to ongoing need for acute care including speech, PT OT and assistance with ADLs given his ongoing mental status changes from baseline.  PT is recommending SNF, TOC team working with family to get him transferred over to an SNF in [place].   Assessment & Plan:   Principal Problem:   Aseptic meningitis Active Problems:   Seizure ([place])   Weight loss   Encounter for nasogastric (NG) tube placement   Malnutrition of moderate degree   Aseptic meningitis -Multiple lumbar punctures performed.  Concerns for possible aseptic meningitis.  Initially treated with RIPE which was discontinued.  Plans to continue oral fluconazole until 03/11/2020. -Most of the work-up was negative except some labs are still pending which are send out including coccidiosis antibody, Blastomyces antigen and paraneoplastic panel -Seen by neurology and infectious disease  Acute ischemic infarct, left thalamic infarct -Acute infarct seen on MRI.  CTA head and neck did not show any acute large vessel occlusion.  Now on Lipitor 80 mg daily.  Currently on aspirin, Plavix on hold in case patient needs repeat LP but will be resumed upon discharge. PT recommending SNF  5 mm pulmonary nodule, right upper lobe -Follow-up outpatient with PCP  Right  eye hematoma -Previously seen by ENT, no need for further intervention at this time.  Medical management.  Tonic-Clonic Seizures, Status  Epilepticus:  -Keppra 1000 mg twice daily  Moderate to severe protein caloric malnutrition -Encourage p.o. intake  Hypomagnesemia - Resolved  Hyperphosphatemia - Low PTH (14), CK low (22) - Sevelamer x1 dose (800mg ) - Previously low in the setting of malnutrition/refeeding syndrome - unclear why elevated - dietary following closely - no high phos in supplements - Likely compounded by AKI as below  HTN/HLD -Continue current meds  Normocytic Anemia Hx of Iron Deficiency Anemia B12 Deficiency  -B12 level slightly low, iron studies normal -Continue B12 supplements  Questionable Crohn's Disease s/p Ileocolic Resection - No longer on immunosuppressive therapy. - Takes budesonide as an outpatient -currently on hold  AKI on CKD stage II, stable -Stable closely monitor   DVT prophylaxis: Subcu heparin Code Status: Full code Family Communication: Daughter Threasa Beards updated by me yesterday on 03/05/2020  Status is: Inpatient  Remains inpatient appropriate because:Inpatient level of care appropriate due to severity of illness   Dispo: The patient is from: Home              Anticipated d/c is to: SNF              Anticipated d/c date is: 2 days              Patient currentlyMedically cleared to be discharged.Currently working on placement at a skilled nursing facility in [place].  Daughter is working with the [facility] team     Body mass index is 18.98 kg/m.      Subjective: Patient seen and examined at bedside, no complaints.  He is ready to be discharged  Review of Systems Otherwise negative except as per HPI, including: General: Denies fever, chills, night sweats or unintended weight loss. Resp: Denies cough, wheezing, shortness of breath. Cardiac: Denies chest pain, palpitations, orthopnea, paroxysmal nocturnal dyspnea. GI: Denies abdominal pain, nausea, vomiting, diarrhea or constipation GU: Denies dysuria, frequency, hesitancy or incontinence MS: Denies  muscle aches, joint pain or swelling Neuro: Denies headache, neurologic deficits (focal weakness, numbness, tingling), abnormal gait Psych: Denies anxiety, depression, SI/HI/AVH Skin: Denies new rashes or lesions ID: Denies sick contacts, exotic exposures, travel  Examination:  Constitutional: Not in acute distress Respiratory: Clear to auscultation bilaterally Cardiovascular: Normal sinus rhythm, no rubs Abdomen: Nontender nondistended good bowel sounds Musculoskeletal: No edema noted Skin: No rashes seen, hematoma with stitches noted over his right eye Neurologic: CN 2-12 grossly intact.  And nonfocal Psychiatric: Normal judgment and insight. Alert and oriented x 3. Normal mood. Objective: Vitals:   03/05/20 1959 03/05/20 2354 03/06/20 0500 03/06/20 0806  BP: (!) 120/54 (!) 125/58  132/69  Pulse: 69 68  83  Resp: 18 17  18   Temp: 97.6 F (36.4 C) 98 F (36.7 C)  98.2 F (36.8 C)  TempSrc: Oral   Oral  SpO2: 97% 100%  100%  Weight:   60 kg   Height:        Intake/Output Summary (Last 24 hours) at 03/06/2020 1038 Last data filed at 03/06/2020 0900 Gross per 24 hour  Intake 1052 ml  Output 950 ml  Net 102 ml   Filed Weights   03/04/20 0500 03/05/20 0500 03/06/20 0500  Weight: 62.9 kg 62.9 kg 60 kg     Data Reviewed:   CBC: No results for input(s): WBC, NEUTROABS, HGB, HCT, MCV, PLT in the last 168 hours. Basic Metabolic Panel:  Recent Labs  Lab 02/29/20 0824 03/01/20 0211 03/02/20 0252 03/03/20 0443 03/05/20 0413 03/06/20 0546  NA 131* 132* 130* 133*  --  136  K 4.4 4.2 4.3 4.7  --  4.3  CL 99 103 103 105  --  106  CO2 20* 17* 19* 22  --  21*  GLUCOSE 97 60* 95 85  --  100*  BUN 32* 34* 28* 28*  --  38*  CREATININE 1.42* 1.40* 1.44* 1.46*  --  1.75*  CALCIUM 9.5 9.1 8.9 9.1  --  9.3  MG 2.0 1.9 1.9 2.2  --  1.3*  PHOS  --   --  6.6*  --  3.7  --    GFR: Estimated Creatinine Clearance: 32.4 mL/min (A) (by C-G formula based on SCr of 1.75 mg/dL  (H)). Liver Function Tests: Recent Labs  Lab 02/29/20 0824  AST 62*  ALT 33  ALKPHOS 46  BILITOT 0.7  PROT 5.9*  ALBUMIN 3.3*   No results for input(s): LIPASE, AMYLASE in the last 168 hours. No results for input(s): AMMONIA in the last 168 hours. Coagulation Profile: No results for input(s): INR, PROTIME in the last 168 hours. Cardiac Enzymes: Recent Labs  Lab 02/28/20 1413  CKTOTAL 22*   BNP (last 3 results) No results for input(s): PROBNP in the last 8760 hours. HbA1C: No results for input(s): HGBA1C in the last 72 hours. CBG: Recent Labs  Lab 03/05/20 1622 03/05/20 1956 03/05/20 2352 03/06/20 0404 03/06/20 0808  GLUCAP 96 85 110* 77 81   Lipid Profile: No results for input(s): CHOL, HDL, LDLCALC, TRIG, CHOLHDL, LDLDIRECT in the last 72 hours. Thyroid Function Tests: No results for input(s): TSH, T4TOTAL, FREET4, T3FREE, THYROIDAB in the last 72 hours. Anemia Panel: No results for input(s): VITAMINB12, FOLATE, FERRITIN, TIBC, IRON, RETICCTPCT in the last 72 hours. Sepsis Labs: No results for input(s): PROCALCITON, LATICACIDVEN in the last 168 hours.  No results found for this or any previous visit (from the past 240 hour(s)).       Radiology Studies: No results found.      Scheduled Meds: . amLODipine  10 mg Oral Daily  . aspirin  81 mg Oral Daily  . atorvastatin  80 mg Oral Daily  . Chlorhexidine Gluconate Cloth  6 each Topical Daily  . dexamethasone (DECADRON) injection  18 mg Intravenous Daily  . docusate  100 mg Oral BID  . feeding supplement (NEPRO CARB STEADY)  237 mL Oral TID BM  . fenofibrate  160 mg Oral Daily  . fluconazole  800 mg Oral Daily  . heparin  5,000 Units Subcutaneous Q8H  . levETIRAcetam  1,000 mg Oral Q12H  . metoprolol succinate  50 mg Oral Daily  . multivitamin with minerals  1 tablet Oral Daily  . pantoprazole sodium  40 mg Oral Daily  . polyethylene glycol  17 g Oral Daily  . potassium chloride  40 mEq Oral Daily   . vitamin B-12  1,000 mcg Oral Daily   Continuous Infusions: . sodium chloride Stopped (02/24/20 0116)  . sodium chloride Stopped (02/24/20 0218)  . sodium chloride 75 mL/hr at 03/06/20 0930  . dextrose 5% lactated ringers 20 mL/hr at 03/03/20 0317  . magnesium sulfate bolus IVPB 4 g (03/06/20 0941)     LOS: 22 days   Time spent= 35 mins    Darlyn Repsher Arsenio Loader, MD [facility]  If 7PM-7AM, please contact night-coverage  03/06/2020, 10:38 AM

## 2020-03-06 NOTE — TOC Progression Note (Addendum)
Transition of Care [facility]) - Progression Note    Patient Details  Name: Howard Allen MRN: [id number] Date of Birth: 29-Oct-1945  Transition of Care [facility]) CM/SW [place], [place] Phone Number: 03/06/2020, 4:24 PM  Clinical Narrative:     Received call form [facility]- they are acute rehab facility. Informed patient's daughter, she requested to try [facility] and [place] called # [phone]- left voice message with Stephanie/admission to return call.  Thurmond Butts, MSW, [place] Clinical Social Worker   Expected Discharge Plan: Skilled Nursing Facility Barriers to Discharge: Continued Medical Work up  Expected Discharge Plan and Services Expected Discharge Plan: [place] Acute Care Choice: IP Rehab Living arrangements for the past 2 months: Single Family Home                                       Social Determinants of Health (SDOH) Interventions    Readmission Risk Interventions No flowsheet data found.

## 2020-03-06 NOTE — Progress Notes (Signed)
Occupational Therapy Treatment Patient Details Name: Howard Allen MRN: [id number] DOB: April 06, 1946 Today's Date: 03/06/2020    History of present illness Pt is a 74 year old male who was having multiple seizures and has a history of falls. 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 NIH 4 on 02/15/20 and then 14 on 02/17/20. Medical hx consisting of Chron's disease and HTN.   OT comments  Pt progressing towards acute OT goals, goals updated this session. Pt was limited by onset of dizziness/sudden fatigue after walking about 3'. BP assessed at end of session: 129/73. D/c plan remains appropriate.   Follow Up Recommendations  SNF    Equipment Recommendations  Other (comment) (defer to next venue)    Recommendations for Other Services      Precautions / Restrictions Precautions Precautions: Fall Precaution Comments: h/o seizures Restrictions Weight Bearing Restrictions: No       Mobility Bed Mobility Overal bed mobility: Needs Assistance Bed Mobility: Supine to Sit;Sit to Supine     Supine to sit: Min assist Sit to supine: Min assist   General bed mobility comments: min A to steady.  Transfers Overall transfer level: Needs assistance Equipment used: Rolling walker (2 wheeled) Transfers: Sit to/from Stand Sit to Stand: Min assist         General transfer comment: assist to steady and control descent    Balance Overall balance assessment: History of Falls;Needs assistance Sitting-balance support: Single extremity supported;Feet supported Sitting balance-Leahy Scale: Fair Sitting balance - Comments:  BUE support in static sitting   Standing balance support: Bilateral upper extremity supported Standing balance-Leahy Scale: Poor Standing balance comment: rw and min steadying assist                           ADL either performed or assessed with clinical judgement   ADL Overall ADL's : Needs assistance/impaired                         Toilet Transfer: Minimal assistance;Stand-pivot;BSC;RW Toilet Transfer Details (indicate cue type and reason): able to take a few steps with min A         Functional mobility during ADLs: Minimal assistance;Rolling walker General ADL Comments: Pt completed bed mobility, sat EOB a few minutes, walked a few feet in the room before suddenly seeming to feel poorly/fatigued and stating he needed to sit down. Pt endorsed dizziness     Vision       Perception     Praxis      Cognition Arousal/Alertness: Awake/alert Behavior During Therapy: Flat affect Overall Cognitive Status: Impaired/Different from baseline Area of Impairment: Attention;Memory;Following commands;Safety/judgement;Awareness;Problem solving                 Orientation Level: Disoriented to;Place;Time;Situation Current Attention Level: Selective Memory: Decreased recall of precautions;Decreased short-term memory Following Commands: Follows one step commands with increased time Safety/Judgement: Decreased awareness of safety;Decreased awareness of deficits Awareness: Intellectual Problem Solving: Slow processing;Decreased initiation;Difficulty sequencing;Requires verbal cues;Requires tactile cues General Comments: pt stating several times desire to return home, tearful at times.  Exercises     Shoulder Instructions       General Comments + dizzy once OOB. BP assessed once in supine 129/73    Pertinent Vitals/ Pain       Pain Assessment: No/denies pain  Home Living                                          Prior  Functioning/Environment              Frequency  Min 2X/week        Progress Toward Goals  OT Goals(current goals can now be found in the care plan section)  Progress towards OT goals: Progressing toward goals  Acute Rehab OT Goals Patient Stated Goal: to go home OT Goal Formulation: With patient/family Time For Goal Achievement: 03/20/20 Potential to Achieve Goals: Good ADL Goals Pt Will Perform Grooming: with set-up;sitting Pt Will Perform Upper Body Dressing: with set-up;sitting;with min guard assist Pt Will Perform Lower Body Dressing: with min assist;sit to/from stand Pt Will Transfer to Toilet: with min assist;ambulating Pt Will Perform Toileting - Clothing Manipulation and hygiene: with min assist;sit to/from stand  Plan Discharge plan remains appropriate    Co-evaluation                 AM-PAC OT "6 Clicks" Daily Activity     Outcome Measure   Help from another person eating meals?: A Little Help from another person taking care of personal grooming?: A Lot Help from another person toileting, which includes using toliet, bedpan, or urinal?: A Lot Help from another person bathing (including washing, rinsing, drying)?: A Lot Help from another person to put on and taking off regular upper body clothing?: A Little Help from another person to put on and taking off regular lower body clothing?: A Lot 6 Click Score: 14    End of Session Equipment Utilized During Treatment: Gait belt;Rolling walker  OT Visit Diagnosis: Unsteadiness on feet (R26.81);History of falling (Z91.81);Muscle weakness (generalized) (M62.81);Other symptoms and signs involving the nervous system (R29.898);Other symptoms and signs involving cognitive function;Pain   Activity Tolerance Patient limited by fatigue;Other (comment) (+ dizzy OOB)   Patient Left in bed;with call bell/phone within reach;with bed alarm set;with SCD's reapplied   Nurse Communication Other (comment) (nurse present  for last part of session)        Time: 0950-1020 OT Time Calculation (min): 30 min  Charges: OT General Charges $OT Visit: 1 Visit OT Treatments $Self Care/Home Management : 23-37 mins  [name], OT Acute Rehabilitation Services Pager: [phone] Office: [phone]    [name] 03/06/2020, 12:49 PM

## 2020-03-06 NOTE — Progress Notes (Signed)
Physical Therapy Treatment Patient Details Name: Howard Allen MRN: [id number] DOB: 1946/04/12 Today's Date: 03/06/2020    History of Present Illness Pt is a 74 year old male who was having multiple seizures and has a history of falls. 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 NIH 4 on 02/15/20 and then 14 on 02/17/20. Medical hx consisting of Chron's disease and HTN.    PT Comments    Pt fatigued upon PT arrival to room, but also urgent to have BM. Pt requiring min-mod assist for mobility tasks today, demonstrating increased difficulty progressing RLE during swing phase of gait vs previous sessions. Pt required max multimodal cuing for form and safety during ambulation to and from bathroom, and required total assist for pericare post-BM. Redness around sacrum noted, PT applied sacral dressing and NT notified. PT continuing to recommend SNF post-acutely, will continue to follow.    Follow Up Recommendations  Supervision/Assistance - 24 hour;SNF     Equipment Recommendations  Rolling walker with 5" wheels;3in1 (PT);Wheelchair (measurements PT);Wheelchair cushion (measurements PT);Hospital bed (vs defer to next venue)    Recommendations for Other Services       Precautions / Restrictions Precautions Precautions: Fall Precaution Comments: h/o seizures Restrictions Weight Bearing Restrictions: No    Mobility  Bed Mobility Overal bed mobility: Needs Assistance Bed Mobility: Supine to Sit;Sit to Supine;Rolling Rolling: Min assist   Supine to sit: Min assist Sit to supine: Min assist   General bed mobility  comments: min-mod assist for rolling and supine<>sit for truncal translation, scooting to and from EOB. Pt able to scoot self up in bed upon return to supine via bridge position.  Transfers Overall transfer level: Needs assistance Equipment used: Rolling walker (2 wheeled) Transfers: Sit to/from Stand Sit to Stand: Min assist;Mod assist         General transfer comment: Min assist for power up from EOB, mod assist for power up from low toilet, steadying, and hand placement when rising/sitting.  Ambulation/Gait Ambulation/Gait assistance: Min assist Gait Distance (Feet): 10 Feet Assistive device: Rolling walker (2 wheeled) Gait Pattern/deviations: Step-through pattern;Decreased stride length;Shuffle;Narrow base of support;Trunk flexed;Decreased step length - right Gait velocity: decr   General Gait Details: Min assist for steadying, max cuing for increasing R step length and LE clearance, directing RW, and sequencing enter/exit bathroom.   Stairs             Wheelchair Mobility    Modified Rankin (Stroke Patients Only) Modified Rankin (Stroke Patients Only) Pre-Morbid Rankin Score: No symptoms Modified Rankin: Moderately severe disability     Balance Overall balance assessment: History of Falls;Needs assistance Sitting-balance support: Single extremity supported;Feet supported Sitting balance-Leahy Scale: Fair     Standing balance support: Bilateral upper extremity supported Standing balance-Leahy Scale: Poor Standing balance comment: reliant on RW and PT assist in standing                            Cognition Arousal/Alertness: Awake/alert Behavior During Therapy: Flat affect Overall Cognitive Status: Impaired/Different from baseline Area of Impairment: Attention;Memory;Following commands;Safety/judgement;Awareness;Problem solving  Orientation Level: Disoriented to;Place;Time;Situation Current Attention Level:  Selective Memory: Decreased recall of precautions;Decreased short-term memory Following Commands: Follows one step commands with increased time Safety/Judgement: Decreased awareness of safety;Decreased awareness of deficits Awareness: Intellectual Problem Solving: Slow processing;Decreased initiation;Difficulty sequencing;Requires verbal cues;Requires tactile cues General Comments: Pt asks what time it is, PT tells pt and pt states "that's the time in this time zone?". Pt unable to state why he is in [place] when he is from [place]. Following commands 75% of the time, other 25% of the time pt limited by urge to have a BM.      Exercises      General Comments General comments (skin integrity, edema, etc.): stool incontinent, requiring total pericare assist to clean up      Pertinent Vitals/Pain Pain Assessment: Faces Faces Pain Scale: Hurts a little bit Pain Location: generalized Pain Descriptors / Indicators: Grimacing;Discomfort Pain Intervention(s): Limited activity within patient's tolerance;Monitored during session;Repositioned    Home Living                      Prior Function            PT Goals (current goals can now be found in the care plan section) Acute Rehab PT Goals Patient Stated Goal: to go home PT Goal Formulation: With patient Time For Goal Achievement: 03/18/20 Potential to Achieve Goals: Good Progress towards PT goals: Progressing toward goals    Frequency    Min 3X/week      PT Plan Current plan remains appropriate    Co-evaluation              AM-PAC PT "6 Clicks" Mobility   Outcome Measure  Help needed turning from your back to your side while in a flat bed without using bedrails?: A Little Help needed moving from lying on your back to sitting on the side of a flat bed without using bedrails?: A Little Help needed moving to and from a bed to a chair (including a wheelchair)?: A Little Help needed standing up from a chair using your  arms (e.g., wheelchair or bedside chair)?: A Little Help needed to walk in hospital room?: A Little Help needed climbing 3-5 steps with a railing? : A Lot 6 Click Score: 17    End of Session Equipment Utilized During Treatment: Gait belt Activity Tolerance: Patient tolerated treatment well;Patient limited by fatigue Patient left: with nursing/sitter in room;in bed;with bed alarm set;with call bell/phone within reach (NT in room to finish washup post-BM) Nurse Communication: Mobility status PT Visit Diagnosis: Unsteadiness on feet (R26.81);Other abnormalities of gait and mobility (R26.89);Muscle weakness (generalized) (M62.81);History of falling (Z91.81);Difficulty in walking, not elsewhere classified (R26.2);Other symptoms and signs involving the nervous system (R29.898)     Time: 2202-5427 PT Time Calculation (min) (ACUTE ONLY): 28 min  Charges:  $Gait Training: 8-22 mins $Self Care/Home Management: 8-22                     Marisa Cyphers, PT Acute Rehabilitation Services Pager [phone]  Office [phone]    Stephana Morell D Elonda Husky 03/06/2020, 4:53 PM

## 2020-03-06 NOTE — TOC Progression Note (Signed)
Transition of Care [facility]) - Progression Note    Patient Details  Name: Howard Allen MRN: [id number] Date of Birth: Mar 15, 1946  Transition of Care [facility]) CM/SW [place], [place] Phone Number: 03/06/2020, 2:01 PM  Clinical Narrative:     [facility] unable to accept patient- per patient's daughter request- referral faxed to [facility] # [phone] fax # [phone]- CSW waiting on response.  CSW will continue to follow and assist with discharge planning.  Thurmond Butts, MSW, [place] Clinical Social Worker    Expected Discharge Plan: Skilled Nursing Facility Barriers to Discharge: Continued Medical Work up  Expected Discharge Plan and Services Expected Discharge Plan: [place] Acute Care Choice: IP Rehab Living arrangements for the past 2 months: Single Family Home                                       Social Determinants of Health (SDOH) Interventions    Readmission Risk Interventions No flowsheet data found.

## 2020-03-07 DIAGNOSIS — G030 Nonpyogenic meningitis: Secondary | ICD-10-CM | POA: Diagnosis not present

## 2020-03-07 LAB — BASIC METABOLIC PANEL
Anion gap: 9 (ref 5–15)
BUN: 32 mg/dL — ABNORMAL HIGH (ref 8–23)
CO2: 21 mmol/L — ABNORMAL LOW (ref 22–32)
Calcium: 9.1 mg/dL (ref 8.9–10.3)
Chloride: 104 mmol/L (ref 98–111)
Creatinine, Ser: 1.36 mg/dL — ABNORMAL HIGH (ref 0.61–1.24)
GFR, Estimated: 55 mL/min — ABNORMAL LOW (ref >60)
Glucose, Bld: 129 mg/dL — ABNORMAL HIGH (ref 70–99)
Potassium: 4.2 mmol/L (ref 3.5–5.1)
Sodium: 134 mmol/L — ABNORMAL LOW (ref 135–145)

## 2020-03-07 LAB — GLUCOSE, CAPILLARY
Glucose-Capillary: 105 mg/dL — ABNORMAL HIGH (ref 70–99)
Glucose-Capillary: 109 mg/dL — ABNORMAL HIGH (ref 70–99)
Glucose-Capillary: 110 mg/dL — ABNORMAL HIGH (ref 70–99)
Glucose-Capillary: 125 mg/dL — ABNORMAL HIGH (ref 70–99)
Glucose-Capillary: 137 mg/dL — ABNORMAL HIGH (ref 70–99)
Glucose-Capillary: 137 mg/dL — ABNORMAL HIGH (ref 70–99)
Glucose-Capillary: 141 mg/dL — ABNORMAL HIGH (ref 70–99)

## 2020-03-07 MED ORDER — PREDNISONE 20 MG PO TABS
20.0000 mg | ORAL_TABLET | Freq: Every day | ORAL | Status: AC
  Administered 2020-03-09 – 2020-03-10 (×2): 20 mg via ORAL
  Filled 2020-03-07 (×2): qty 1

## 2020-03-07 MED ORDER — PREDNISONE 5 MG PO TABS
10.0000 mg | ORAL_TABLET | Freq: Every day | ORAL | Status: AC
  Administered 2020-03-11 – 2020-03-13 (×3): 10 mg via ORAL
  Filled 2020-03-07 (×3): qty 2

## 2020-03-07 MED ORDER — PREDNISONE 20 MG PO TABS
20.0000 mg | ORAL_TABLET | Freq: Two times a day (BID) | ORAL | Status: AC
  Administered 2020-03-07 – 2020-03-08 (×4): 20 mg via ORAL
  Filled 2020-03-07 (×4): qty 1

## 2020-03-07 NOTE — Progress Notes (Signed)
PROGRESS NOTE    Howard Allen  FTD:[id number] DOB: [birth year] DOA: 02/13/2020 PCP: System, Provider Not In   Brief Narrative:  74 year old Caucasian male with a past medical history significant for Crohn's disease, hypertension, hyperlipidemia, depression history of hospitalization in June 2021 for pneumatosis status post ileocolonic resection and lysis of adhesions who presented with multiple tonic-clonic seizures complicated by status epilepticus that required intubation and loading with Keppra. CT of the head was negative and initially when he was brought in to the ED by EMS there was at least 3 witnessed generalized tonic-clonic seizures noted. PCCM was consulted for admission given that he was intubated on arrival and unresponsive. He was intubated on 02/13/2020 and extubated 02/15/2020. Neurology was consulted and he underwent further work-up and was found to have a signal abnormality in the left limb is most consistent with an acute infarct. Echocardiogram done showed an EF of 55 to 60% with moderate LVH and grade 1 diastolic dysfunction. EEG testing on 10/9 was negative. 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 He underwent several viral serologies and had a lumbar puncture sent on 02/14/2020. He was started on IV vancomycin, IV ceftriaxone, IV ampicillin and IV acyclovir for suspected meningitis. CSF analysis was consistent with aseptic meningitis with high white blood cells and very low glucose level. CSF culture was initally negative. He was transferred to [facility] service on 02/19/2020 and remained confused. Subsequently ID was consulted and they were suspecting aseptic meningoencephalitis and recommended large volume LP. Patient was already on aspirin and Plavix for past 5 days. His Plavix was stopped on 02/20/2020. P2 Y 12 was checked and based on that, neurology performed another large-volume LP on 02/22/2020 and several labs were sent by ID. 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  Given negative findings thus far IV has discontinued RIPE therapy will continue fluconazole 800 mg p.o. daily for 7 more days to cover possible fungal infection.  No longer requiring further evaluation for lumbar puncture for labs per their expertise.  At this point given no further need for invasive work-up as previously considering LP patient is reasonably stable for discharge to skilled nursing facility due to ongoing need for acute care including speech, PT OT and assistance with ADLs given his ongoing mental status changes from baseline.  PT is recommending SNF, TOC team working with family to get him transferred over to an SNF in [place].   Assessment & Plan:   Principal Problem:   Aseptic meningitis Active Problems:   Seizure ([place])   Weight loss   Encounter for nasogastric (NG) tube placement   Malnutrition of moderate degree   Aseptic meningitis -Multiple lumbar punctures performed.  Concerns for possible aseptic meningitis.  Initially treated with RIPE which was discontinued.  Plans to continue oral fluconazole until 03/11/2020. -Most of the work-up was negative except some labs are still pending which are send out including coccidiosis antibody, Blastomyces antigen and paraneoplastic panel -Seen by neurology and infectious disease -Prednisone taper over 7 days ordered, last day 11/4  Acute ischemic infarct, left thalamic infarct -Acute infarct seen on MRI.  CTA head and neck did not show any acute large vessel occlusion.  Now on Lipitor 80 mg daily.  Currently on aspirin, Plavix on hold in case patient needs repeat LP but will be resumed upon discharge. PT recommending SNF  5 mm pulmonary nodule,  right upper lobe -Follow-up outpatient with PCP  Right eye hematoma -Previously seen by ENT, no need for further intervention at this time.  Medical  management.  Tonic-Clonic Seizures, Status Epilepticus:  -Keppra 1000 mg twice daily  Moderate to severe protein caloric malnutrition -Encourage p.o. intake  Hypomagnesemia - Resolved  Hyperphosphatemia - Low PTH (14), CK low (22) - Sevelamer x1 dose (800mg ) - Previously low in the setting of malnutrition/refeeding syndrome - unclear why elevated - dietary following closely - no high phos in supplements - Likely compounded by AKI as below  HTN/HLD -Continue current meds  Normocytic Anemia Hx of Iron Deficiency Anemia B12 Deficiency  -B12 level slightly low, iron studies normal -Continue B12 supplements  Questionable Crohn's Disease s/p Ileocolic Resection - No longer on immunosuppressive therapy. - Takes budesonide as an outpatient -currently on hold  AKI on CKD stage II, stable -Stable   DVT prophylaxis: Subcu heparin Code Status: Full code Family Communication: Daughter Threasa Beards updated by me yesterday on 03/05/2020  Status is: Inpatient  Remains inpatient appropriate because:Inpatient level of care appropriate due to severity of illness   Dispo: The patient is from: Home              Anticipated d/c is to: SNF              Anticipated d/c date is: 2 days              Patient currentlyMedically cleared to be discharged.Currently working on placement at a skilled nursing facility in [place].  Daughter is working with the [facility] team     Body mass index is 19.87 kg/m.      Subjective:  very eager to be discharged, no complaints doing well.  He understands he needs to go to rehab to get his strength back  Review of Systems Otherwise negative except as per HPI, including: General: Denies fever, chills, night sweats or unintended weight loss. Resp: Denies cough, wheezing, shortness of breath. Cardiac: Denies chest pain, palpitations, orthopnea, paroxysmal nocturnal dyspnea. GI: Denies abdominal pain, nausea, vomiting, diarrhea or constipation GU:  Denies dysuria, frequency, hesitancy or incontinence MS: Denies muscle aches, joint pain or swelling Neuro: Denies headache, neurologic deficits (focal weakness, numbness, tingling), abnormal gait Psych: Denies anxiety, depression, SI/HI/AVH Skin: Denies new rashes or lesions ID: Denies sick contacts, exotic exposures, travel  Examination: Constitutional: Not in acute distress Respiratory: Clear to auscultation bilaterally Cardiovascular: Normal sinus rhythm, no rubs Abdomen: Nontender nondistended good bowel sounds Musculoskeletal: No edema noted Skin: Hematoma noted above the right eye, stitches in place Neurologic: CN 2-12 grossly intact.  And nonfocal Psychiatric: Normal judgment and insight. Alert and oriented x 3. Normal mood.  Objective: Vitals:   03/07/20 0000 03/07/20 0200 03/07/20 0400 03/07/20 0743  BP: 112/69  129/66 126/60  Pulse: 62  67 62  Resp: 16  16 18   Temp: (!) 97.5 F (36.4 C)  98.3 F (36.8 C) 97.9 F (36.6 C)  TempSrc: Oral  Oral Oral  SpO2: 100%  100% 100%  Weight:  62.8 kg    Height:        Intake/Output Summary (Last 24 hours) at 03/07/2020 0750 Last data filed at 03/07/2020 0400 Gross per 24 hour  Intake 720 ml  Output 1701 ml  Net -981 ml   Filed Weights   03/05/20 0500 03/06/20 0500 03/07/20 0200  Weight: 62.9 kg 60 kg 62.8 kg     Data Reviewed:   CBC: No results for input(s): WBC,  NEUTROABS, HGB, HCT, MCV, PLT in the last 168 hours. Basic Metabolic Panel: Recent Labs  Lab 02/29/20 0824 02/29/20 6160 03/01/20 0211 03/02/20 0252 03/03/20 0443 03/05/20 0413 03/06/20 0546 03/07/20 0231  NA 131*   < > 132* 130* 133*  --  136 134*  K 4.4   < > 4.2 4.3 4.7  --  4.3 4.2  CL 99   < > 103 103 105  --  106 104  CO2 20*   < > 17* 19* 22  --  21* 21*  GLUCOSE 97   < > 60* 95 85  --  100* 129*  BUN 32*   < > 34* 28* 28*  --  38* 32*  CREATININE 1.42*   < > 1.40* 1.44* 1.46*  --  1.75* 1.36*  CALCIUM 9.5   < > 9.1 8.9 9.1  --  9.3 9.1   MG 2.0  --  1.9 1.9 2.2  --  1.3*  --   PHOS  --   --   --  6.6*  --  3.7  --   --    < > = values in this interval not displayed.   GFR: Estimated Creatinine Clearance: 43.6 mL/min (A) (by C-G formula based on SCr of 1.36 mg/dL (H)). Liver Function Tests: Recent Labs  Lab 02/29/20 0824  AST 62*  ALT 33  ALKPHOS 46  BILITOT 0.7  PROT 5.9*  ALBUMIN 3.3*   No results for input(s): LIPASE, AMYLASE in the last 168 hours. No results for input(s): AMMONIA in the last 168 hours. Coagulation Profile: No results for input(s): INR, PROTIME in the last 168 hours. Cardiac Enzymes: No results for input(s): CKTOTAL, CKMB, CKMBINDEX, TROPONINI in the last 168 hours. BNP (last 3 results) No results for input(s): PROBNP in the last 8760 hours. HbA1C: No results for input(s): HGBA1C in the last 72 hours. CBG: Recent Labs  Lab 03/06/20 1135 03/06/20 1610 03/06/20 2032 03/07/20 0012 03/07/20 0423  GLUCAP 127* 139* 155* 137* 109*   Lipid Profile: No results for input(s): CHOL, HDL, LDLCALC, TRIG, CHOLHDL, LDLDIRECT in the last 72 hours. Thyroid Function Tests: No results for input(s): TSH, T4TOTAL, FREET4, T3FREE, THYROIDAB in the last 72 hours. Anemia Panel: No results for input(s): VITAMINB12, FOLATE, FERRITIN, TIBC, IRON, RETICCTPCT in the last 72 hours. Sepsis Labs: No results for input(s): PROCALCITON, LATICACIDVEN in the last 168 hours.  No results found for this or any previous visit (from the past 240 hour(s)).       Radiology Studies: No results found.      Scheduled Meds: . amLODipine  10 mg Oral Daily  . aspirin  81 mg Oral Daily  . atorvastatin  80 mg Oral Daily  . Chlorhexidine Gluconate Cloth  6 each Topical Daily  . docusate  100 mg Oral BID  . feeding supplement (NEPRO CARB STEADY)  237 mL Oral TID BM  . fenofibrate  160 mg Oral Daily  . fluconazole  800 mg Oral Daily  . heparin  5,000 Units Subcutaneous Q8H  . levETIRAcetam  1,000 mg Oral Q12H  .  metoprolol succinate  50 mg Oral Daily  . multivitamin with minerals  1 tablet Oral Daily  . pantoprazole sodium  40 mg Oral Daily  . polyethylene glycol  17 g Oral Daily  . potassium chloride  40 mEq Oral Daily  . predniSONE  20 mg Oral BID WC   Followed by  . [START ON 03/09/2020] predniSONE  20  mg Oral Q breakfast   Followed by  . [START ON 03/11/2020] predniSONE  10 mg Oral Q breakfast  . vitamin B-12  1,000 mcg Oral Daily   Continuous Infusions: . sodium chloride Stopped (02/24/20 0116)  . sodium chloride Stopped (02/24/20 0218)  . sodium chloride 75 mL/hr at 03/06/20 0930  . dextrose 5% lactated ringers 20 mL/hr at 03/03/20 0317     LOS: 23 days   Time spent= 35 mins    Makyle Eslick Arsenio Loader, MD [facility]  If 7PM-7AM, please contact night-coverage  03/07/2020, 7:50 AM

## 2020-03-07 NOTE — TOC Progression Note (Signed)
Transition of Care [facility]) - Progression Note    Patient Details  Name: Howard Allen MRN: [id number] Date of Birth: 10-31-1945  Transition of Care [facility]) CM/SW [place], [place] Phone Number: 03/07/2020, 12:40 PM  Clinical Narrative:     [place] - unable to reach [facility] in admissions- will try to call again today.  Thurmond Butts, MSW, [place] Clinical Social Worker   Expected Discharge Plan: Skilled Nursing Facility Barriers to Discharge: Continued Medical Work up  Expected Discharge Plan and Services Expected Discharge Plan: [place] Acute Care Choice: IP Rehab Living arrangements for the past 2 months: Single Family Home                                       Social Determinants of Health (SDOH) Interventions    Readmission Risk Interventions No flowsheet data found.

## 2020-03-07 NOTE — Progress Notes (Signed)
Physical Therapy Treatment Patient Details Name: Howard Allen MRN: [id number] DOB: [birth year] Today's Date: 03/07/2020    History of Present Illness Pt is a 74 year old male who was having multiple seizures and has a history of falls. 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 NIH 4 on 02/15/20 and then 14 on 02/17/20. Medical hx consisting of Chron's disease and HTN.    PT Comments    Pt slightly irritable today, but agreeable to OOB mobility because he needs to have BM. Pt requiring min-mod assist today, demonstrating more unsteadiness during gait vs previous two sessions with this PT. Pt attributes this to fatigue. SNF remains appropriate disposition, will continue to follow acutely.    Follow Up Recommendations  Supervision/Assistance - 24 hour;SNF     Equipment Recommendations  Rolling walker with 5" wheels;3in1 (PT);Wheelchair (measurements PT);Wheelchair cushion (measurements PT);Hospital bed (vs defer to next venue)    Recommendations for Other Services       Precautions / Restrictions Precautions Precautions: Fall Precaution Comments: h/o seizures Restrictions Weight Bearing Restrictions: No    Mobility  Bed Mobility Overal bed mobility: Needs Assistance Bed Mobility: Supine to Sit     Supine to sit: Min guard     General bed mobility comments: for safety, increased time  Transfers Overall transfer level: Needs assistance Equipment used: Rolling walker (2 wheeled) Transfers: Sit to/from Stand Sit to Stand: Mod assist         General transfer comment: mod asssist for power up, steadying,  and correcting LOB x1 posteriorly and x1 anteriorly  Ambulation/Gait Ambulation/Gait assistance: Min assist;Mod assist Gait Distance (Feet): 10 Feet (x2) Assistive device: Rolling walker (2 wheeled) Gait Pattern/deviations: Step-through pattern;Decreased stride length;Shuffle;Narrow base of support;Trunk flexed;Decreased step length - right Gait velocity: decr   General Gait Details: Min assist for steadying, occasional mod assist to correct LOB. Verbal cuing for increased step length on RLE, placement in RW, sequencing directional changes.   Stairs             Wheelchair Mobility    Modified Rankin (Stroke Patients Only) Modified Rankin (Stroke Patients Only) Pre-Morbid Rankin Score: No symptoms Modified Rankin: Moderately severe disability     Balance Overall balance assessment: History of Falls;Needs assistance Sitting-balance support: Single extremity supported;Feet supported Sitting balance-Leahy Scale: Fair     Standing balance support: Bilateral upper extremity supported Standing balance-Leahy Scale: Poor Standing balance comment: reliant on RW and PT assist in standing                            Cognition Arousal/Alertness: Awake/alert Behavior During Therapy: Flat affect;Restless Overall Cognitive Status: Impaired/Different from baseline Area of Impairment: Attention;Memory;Following commands;Safety/judgement;Awareness;Problem solving                 Orientation Level: Disoriented to;Time;Situation Current Attention Level: Selective Memory: Decreased recall of precautions;Decreased short-term memory Following Commands: Follows one step commands with increased time Safety/Judgement: Decreased awareness of safety;Decreased awareness of deficits Awareness: Intellectual Problem Solving: Slow processing;Decreased initiation;Difficulty sequencing;Requires verbal cues;Requires tactile cues General Comments: Pt initially states he is in [place],  corrects self  to [place]. Pt irritable this session, getting frustrated if he is asked to repeat himself. Follows mobility commands inconsistently.      Exercises      General Comments General comments (skin integrity, edema, etc.): pericare assist, pt attempting to perform with PT assist to get completely clean      Pertinent Vitals/Pain Pain Assessment: Faces Faces Pain Scale: Hurts a little bit Pain Location: generalized - pt unable to state when PT asked for clarification Pain Descriptors / Indicators: Grimacing;Discomfort Pain Intervention(s): Limited activity within patient's tolerance;Monitored during session;Repositioned    Home Living                      Prior Function            PT Goals (current goals can now be found in the care plan section) Acute Rehab PT Goals Patient Stated Goal: to go home PT Goal Formulation: With patient Time For Goal Achievement: 03/18/20 Potential to Achieve Goals: Good Progress towards PT goals: Progressing toward goals    Frequency    Min 3X/week      PT Plan Current plan remains appropriate    Co-evaluation              AM-PAC PT "6 Clicks" Mobility   Outcome Measure  Help needed turning from your back to your side while in a flat bed without using bedrails?: A Little Help needed moving from lying on your back to sitting on the side of a flat bed without using bedrails?: A Little Help needed moving to and from a bed to a chair (including a wheelchair)?: A Little Help needed standing up from a chair using your arms (e.g., wheelchair or bedside chair)?: A Little Help needed to walk in hospital room?: A Little Help needed climbing 3-5 steps with a railing? : A Lot 6 Click Score: 17    End of Session Equipment Utilized During Treatment: Gait belt Activity Tolerance: Patient tolerated treatment well;Patient limited by fatigue Patient left: with nursing/sitter in room;in bed;with bed alarm set;with call bell/phone  within reach Nurse Communication: Mobility status PT Visit Diagnosis: Unsteadiness on feet (R26.81);Other abnormalities of gait and mobility (R26.89);Muscle weakness (generalized) (M62.81);History of falling (Z91.81);Difficulty in walking, not elsewhere classified (R26.2);Other symptoms and signs involving the nervous system (R29.898)     Time: 3664-4034 PT Time Calculation (min) (ACUTE ONLY): 18 min  Charges:  $Gait Training: 8-22 mins                     Burrel Legrand E, PT Acute Rehabilitation Services Pager [phone]  Office [phone]    Gar Glance D Elonda Husky 03/07/2020, 4:59 PM

## 2020-03-07 NOTE — Progress Notes (Signed)
  Speech Language Pathology Treatment: Cognitive-Linquistic  Patient Details Name: Howard Allen MRN: [id number] DOB: [birth year] Today's Date: 03/07/2020 Time: 4401-0272 SLP Time Calculation (min) (ACUTE ONLY): 19 min  Assessment / Plan / Recommendation Clinical Impression  Pt was seen for cognitive-linguistic treatment. He was alert and cooperative throughout the session but was very focused on leaving. He demonstrated 100% with 4-item recall and 80% accuracy with recall of 5 items. He was unable to recall swallowing precautions despite cues and denied recollection of prior events in the day or discussion with providers regarding his discharge plan. He demonstrated partial temporal orientation and required cues for accuracy on day and date. He completed a 3-digit backward digit span task with 100% accuracy. He completed a 4-digit mental manipulation task with 60% accuracy increasing to 100% with self-correction. He demonstrated 80% accuracy with a time management task increasing to 100% accuracy with cues and repetition. SLP will continue to follow pt.    HPI HPI: 74 y.o. 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        SLP Plan  Continue with current plan of care       Recommendations  Diet recommendations: Dysphagia 3 (mechanical soft);Thin liquid Liquids provided via: Cup;Straw Medication Administration: Crushed with puree Supervision: Patient able to self feed;Full supervision/cueing for compensatory strategies Compensations: Minimize environmental distractions;Slow rate;Small sips/bites;Chin tuck Postural Changes and/or Swallow Maneuvers: Seated upright 90 degrees                 Oral Care Recommendations: Oral care BID Follow up Recommendations: Skilled Nursing facility SLP Visit Diagnosis: Dysphagia, oropharyngeal phase (R13.12) Plan: Continue with current plan of care       [place] Dohmen I. Hardin Negus, [place], [place] Office number [phone] Pager [place] 03/07/2020, 3:11 PM

## 2020-03-08 DIAGNOSIS — G030 Nonpyogenic meningitis: Secondary | ICD-10-CM | POA: Diagnosis not present

## 2020-03-08 LAB — GLUCOSE, CAPILLARY
Glucose-Capillary: 129 mg/dL — ABNORMAL HIGH (ref 70–99)
Glucose-Capillary: 112 mg/dL — ABNORMAL HIGH (ref 70–99)
Glucose-Capillary: 175 mg/dL — ABNORMAL HIGH (ref 70–99)
Glucose-Capillary: 144 mg/dL — ABNORMAL HIGH (ref 70–99)
Glucose-Capillary: 139 mg/dL — ABNORMAL HIGH (ref 70–99)

## 2020-03-08 LAB — PHOSPHORUS: Phosphorus: 2.4 mg/dL — ABNORMAL LOW (ref 2.5–4.6)

## 2020-03-08 MED ORDER — MAGNESIUM SULFATE 2 GM/50ML IV SOLN
2.0000 g | Freq: Once | INTRAVENOUS | Status: AC
  Administered 2020-03-08: 2 g via INTRAVENOUS
  Filled 2020-03-08: qty 50

## 2020-03-08 MED ORDER — NICOTINE 14 MG/24HR TD PT24
14.0000 mg | MEDICATED_PATCH | Freq: Every day | TRANSDERMAL | Status: DC
  Administered 2020-03-08 – 2020-03-17 (×10): 14 mg via TRANSDERMAL
  Filled 2020-03-08 (×10): qty 1

## 2020-03-08 MED ORDER — POTASSIUM PHOSPHATES 15 MMOLE/5ML IV SOLN
20.0000 mmol | Freq: Once | INTRAVENOUS | Status: AC
  Administered 2020-03-08: 20 mmol via INTRAVENOUS
  Filled 2020-03-08: qty 6.67

## 2020-03-08 NOTE — Progress Notes (Signed)
PROGRESS NOTE    Howard Allen  WUJ:[id number] DOB: 08-Feb-1946 DOA: 02/13/2020 PCP: System, Provider Not In   Brief Narrative:  74 year old Caucasian male with a past medical history significant for Crohn's disease, hypertension, hyperlipidemia, depression history of hospitalization in June 2021 for pneumatosis status post ileocolonic resection and lysis of adhesions who presented with multiple tonic-clonic seizures complicated by status epilepticus that required intubation and loading with Keppra. CT of the head was negative and initially when he was brought in to the ED by EMS there was at least 3 witnessed generalized tonic-clonic seizures noted. PCCM was consulted for admission given that he was intubated on arrival and unresponsive. He was intubated on 02/13/2020 and extubated 02/15/2020. Neurology was consulted and he underwent further work-up and was found to have a signal abnormality in the left limb is most consistent with an acute infarct. Echocardiogram done showed an EF of 55 to 60% with moderate LVH and grade 1 diastolic dysfunction. EEG testing on 10/9 was negative. 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 He underwent several viral serologies and had a lumbar puncture sent on 02/14/2020. He was started on IV vancomycin, IV ceftriaxone, IV ampicillin and IV acyclovir for suspected meningitis. CSF analysis was consistent with aseptic meningitis with high white blood cells and very low glucose level. CSF culture was initally negative. He was transferred to [facility] service on 02/19/2020 and remained confused. Subsequently ID was consulted and they were suspecting aseptic meningoencephalitis and recommended large volume LP. Patient was already on aspirin and Plavix for past 5 days. His Plavix was stopped on 02/20/2020. P2 Y 12 was checked and based on that, neurology performed another large-volume LP on 02/22/2020 and several labs were sent by ID. 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  Given negative findings thus far IV has discontinued RIPE therapy will continue fluconazole 800 mg p.o. daily for 7 more days to cover possible fungal infection.  No longer requiring further evaluation for lumbar puncture for labs per their expertise.  At this point given no further need for invasive work-up as previously considering LP patient is reasonably stable for discharge to skilled nursing facility due to ongoing need for acute care including speech, PT OT and assistance with ADLs given his ongoing mental status changes from baseline.  PT is recommending SNF, TOC team working with family to get him transferred over to an SNF in [place].   Assessment & Plan:   Principal Problem:   Aseptic meningitis Active Problems:   Seizure ([place])   Weight loss   Encounter for nasogastric (NG) tube placement   Malnutrition of moderate degree   Aseptic meningitis -Multiple lumbar punctures performed.  Concerns for possible aseptic meningitis.  Initially treated with RIPE which was discontinued.  Plans to continue oral fluconazole until 03/11/2020. -Most of the work-up was negative except some labs are still pending which are send out including coccidiosis antibody, Blastomyces antigen and paraneoplastic panel -Seen by neurology and infectious disease -Prednisone taper over 7 days ordered, last day 11/4  Acute ischemic infarct, left thalamic infarct -Acute infarct seen on MRI.  CTA head and neck did not show any acute large vessel occlusion.  Now on Lipitor 80 mg daily.  Currently on aspirin, Plavix on hold in case patient needs repeat LP but will be resumed upon discharge. PT recommending SNF.  Family working with [facility]  team on placement  5 mm pulmonary nodule, right upper lobe -Follow-up outpatient with PCP  Right eye hematoma -Previously seen by ENT, no need for  further intervention at this time.  Medical management.  Tonic-Clonic Seizures, Status Epilepticus:  -Keppra 1000 mg twice daily  Moderate to severe protein caloric malnutrition -Encourage p.o. intake  Hypophosphatemia -Phosphate repletion.  Daily recheck electrolytes.   HTN/HLD -Continue current meds  Normocytic Anemia Hx of Iron Deficiency Anemia B12 Deficiency  -B12 level slightly low, iron studies normal -Continue B12 supplements  Questionable Crohn's Disease s/p Ileocolic Resection - No longer on immunosuppressive therapy. - Takes budesonide as an outpatient -currently on hold  AKI on CKD stage II, stable -Stable  Overall very weak, needs to go to SNF   DVT prophylaxis: Subcu heparin Code Status: Full code Family Communication: Spoke with Threasa Beards this morning 03/08/2020  Status is: Inpatient  Remains inpatient appropriate because:Inpatient level of care appropriate due to severity of illness   Dispo: The patient is from: Home              Anticipated d/c is to: SNF              Anticipated d/c date is: Whenever bed available              Patient currently medically cleared, pending safe disposition plan.  TOC team working with the family for placement in [place]-SNF    Body mass index is 21.73 kg/m.      Subjective: Eager to go home but he understands he needs to go to rehab   Examination: Constitutional: Not in acute distress Respiratory: Clear to auscultation bilaterally Cardiovascular: Normal sinus rhythm, no rubs Abdomen: Nontender nondistended good bowel sounds Musculoskeletal: No edema noted Skin: Area of hematoma noted right above his right eye, stitches in place Neurologic: CN 2-12 grossly intact.  And nonfocal Psychiatric: Normal judgment and insight. Alert and oriented x 3. Normal mood.  Objective: Vitals:   03/07/20 1940 03/07/20 2324 03/08/20 0324 03/08/20 0731  BP: 117/72 120/69 117/67 (!) 110/57  Pulse: 64 66 63 64   Resp: 18 18 18 18   Temp: 97.8 F (36.6 C) 98 F (36.7 C) 98.3 F (36.8 C) 98 F (36.7 C)  TempSrc: Oral Oral Oral Oral  SpO2: 100% 99% 98% 96%  Weight:   68.7 kg   Height:        Intake/Output Summary (Last 24 hours) at 03/08/2020 0742 Last data filed at 03/08/2020 0325 Gross per 24 hour  Intake 240 ml  Output 900 ml  Net -660 ml   Filed Weights   03/06/20 0500 03/07/20 0200 03/08/20 0324  Weight: 60 kg 62.8 kg 68.7 kg     Data Reviewed:   CBC: No results for input(s): WBC, NEUTROABS, HGB, HCT, MCV, PLT in the last 168 hours. Basic Metabolic Panel: Recent Labs  Lab 03/02/20 0252 03/03/20 0443 03/05/20 0413 03/06/20 0546 03/07/20 0231 03/08/20 0515  NA 130* 133*  --  136 134*  --   K 4.3 4.7  --  4.3 4.2  --   CL 103 105  --  106 104  --   CO2 19* 22  --  21* 21*  --   GLUCOSE 95 85  --  100* 129*  --   BUN 28* 28*  --  38* 32*  --   CREATININE 1.44* 1.46*  --  1.75* 1.36*  --   CALCIUM 8.9 9.1  --  9.3 9.1  --  MG 1.9 2.2  --  1.3*  --   --   PHOS 6.6*  --  3.7  --   --  2.4*   GFR: Estimated Creatinine Clearance: 47.7 mL/min (A) (by C-G formula based on SCr of 1.36 mg/dL (H)). Liver Function Tests: No results for input(s): AST, ALT, ALKPHOS, BILITOT, PROT, ALBUMIN in the last 168 hours. No results for input(s): LIPASE, AMYLASE in the last 168 hours. No results for input(s): AMMONIA in the last 168 hours. Coagulation Profile: No results for input(s): INR, PROTIME in the last 168 hours. Cardiac Enzymes: No results for input(s): CKTOTAL, CKMB, CKMBINDEX, TROPONINI in the last 168 hours. BNP (last 3 results) No results for input(s): PROBNP in the last 8760 hours. HbA1C: No results for input(s): HGBA1C in the last 72 hours. CBG: Recent Labs  Lab 03/07/20 1613 03/07/20 1938 03/07/20 2323 03/08/20 0326 03/08/20 0735  GLUCAP 137* 141* 125* 129* 112*   Lipid Profile: No results for input(s): CHOL, HDL, LDLCALC, TRIG, CHOLHDL, LDLDIRECT in the last  72 hours. Thyroid Function Tests: No results for input(s): TSH, T4TOTAL, FREET4, T3FREE, THYROIDAB in the last 72 hours. Anemia Panel: No results for input(s): VITAMINB12, FOLATE, FERRITIN, TIBC, IRON, RETICCTPCT in the last 72 hours. Sepsis Labs: No results for input(s): PROCALCITON, LATICACIDVEN in the last 168 hours.  No results found for this or any previous visit (from the past 240 hour(s)).       Radiology Studies: No results found.      Scheduled Meds: . amLODipine  10 mg Oral Daily  . aspirin  81 mg Oral Daily  . atorvastatin  80 mg Oral Daily  . Chlorhexidine Gluconate Cloth  6 each Topical Daily  . docusate  100 mg Oral BID  . feeding supplement (NEPRO CARB STEADY)  237 mL Oral TID BM  . fenofibrate  160 mg Oral Daily  . fluconazole  800 mg Oral Daily  . heparin  5,000 Units Subcutaneous Q8H  . levETIRAcetam  1,000 mg Oral Q12H  . metoprolol succinate  50 mg Oral Daily  . multivitamin with minerals  1 tablet Oral Daily  . pantoprazole sodium  40 mg Oral Daily  . polyethylene glycol  17 g Oral Daily  . predniSONE  20 mg Oral BID WC   Followed by  . [START ON 03/09/2020] predniSONE  20 mg Oral Q breakfast   Followed by  . [START ON 03/11/2020] predniSONE  10 mg Oral Q breakfast  . vitamin B-12  1,000 mcg Oral Daily   Continuous Infusions: . sodium chloride Stopped (02/24/20 0116)  . sodium chloride Stopped (02/24/20 0218)  . dextrose 5% lactated ringers 20 mL/hr at 03/03/20 0317  . potassium PHOSPHATE IVPB (in mmol)       LOS: 24 days   Time spent= 35 mins    Rynn Markiewicz Arsenio Loader, MD [facility]  If 7PM-7AM, please contact night-coverage  03/08/2020, 7:42 AM

## 2020-03-09 DIAGNOSIS — G030 Nonpyogenic meningitis: Secondary | ICD-10-CM | POA: Diagnosis not present

## 2020-03-09 LAB — MISC LABCORP TEST (SEND OUT): Labcorp test code: 9985

## 2020-03-09 LAB — PHOSPHORUS: Phosphorus: 3.6 mg/dL (ref 2.5–4.6)

## 2020-03-09 LAB — GLUCOSE, CAPILLARY
Glucose-Capillary: 104 mg/dL — ABNORMAL HIGH (ref 70–99)
Glucose-Capillary: 110 mg/dL — ABNORMAL HIGH (ref 70–99)
Glucose-Capillary: 119 mg/dL — ABNORMAL HIGH (ref 70–99)
Glucose-Capillary: 127 mg/dL — ABNORMAL HIGH (ref 70–99)
Glucose-Capillary: 140 mg/dL — ABNORMAL HIGH (ref 70–99)
Glucose-Capillary: 87 mg/dL (ref 70–99)

## 2020-03-09 LAB — MAGNESIUM: Magnesium: 1.7 mg/dL (ref 1.7–2.4)

## 2020-03-09 LAB — BASIC METABOLIC PANEL
Anion gap: 11 (ref 5–15)
BUN: 33 mg/dL — ABNORMAL HIGH (ref 8–23)
CO2: 20 mmol/L — ABNORMAL LOW (ref 22–32)
Calcium: 9.1 mg/dL (ref 8.9–10.3)
Chloride: 100 mmol/L (ref 98–111)
Creatinine, Ser: 1.34 mg/dL — ABNORMAL HIGH (ref 0.61–1.24)
GFR, Estimated: 56 mL/min — ABNORMAL LOW (ref >60)
Glucose, Bld: 111 mg/dL — ABNORMAL HIGH (ref 70–99)
Potassium: 4.9 mmol/L (ref 3.5–5.1)
Sodium: 131 mmol/L — ABNORMAL LOW (ref 135–145)

## 2020-03-09 MED ORDER — MAGNESIUM OXIDE 400 (241.3 MG) MG PO TABS
800.0000 mg | ORAL_TABLET | Freq: Once | ORAL | Status: AC
  Administered 2020-03-09: 800 mg via ORAL
  Filled 2020-03-09: qty 2

## 2020-03-09 NOTE — Progress Notes (Signed)
PROGRESS NOTE    Howard Allen  WUJ:[id number] DOB: Sep 27, 1945 DOA: 02/13/2020 PCP: System, Provider Not In   Brief Narrative:  74 year old Caucasian male with a past medical history significant for Crohn's disease, hypertension, hyperlipidemia, depression history of hospitalization in June 2021 for pneumatosis status post ileocolonic resection and lysis of adhesions who presented with multiple tonic-clonic seizures complicated by status epilepticus that required intubation and loading with Keppra. CT of the head was negative and initially when he was brought in to the ED by EMS there was at least 3 witnessed generalized tonic-clonic seizures noted. PCCM was consulted for admission given that he was intubated on arrival and unresponsive. He was intubated on 02/13/2020 and extubated 02/15/2020. Neurology was consulted and he underwent further work-up and was found to have a signal abnormality in the left limb is most consistent with an acute infarct. Echocardiogram done showed an EF of 55 to 60% with moderate LVH and grade 1 diastolic dysfunction. EEG testing on 10/9 was negative. 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 He underwent several viral serologies and had a lumbar puncture sent on 02/14/2020. He was started on IV vancomycin, IV ceftriaxone, IV ampicillin and IV acyclovir for suspected meningitis. CSF analysis was consistent with aseptic meningitis with high white blood cells and very low glucose level. CSF culture was initally negative. He was transferred to [facility] service on 02/19/2020 and remained confused. Subsequently ID was consulted and they were suspecting aseptic meningoencephalitis and recommended large volume LP. Patient was already on aspirin and Plavix for past 5 days. His Plavix was stopped on 02/20/2020. P2 Y 12 was checked and based on that, neurology performed another large-volume LP on 02/22/2020 and several labs were sent by ID. 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  Given negative findings thus far IV has discontinued RIPE therapy will continue fluconazole 800 mg p.o. daily for 7 more days to cover possible fungal infection.  No longer requiring further evaluation for lumbar puncture for labs per their expertise.  At this point given no further need for invasive work-up as previously considering LP patient is reasonably stable for discharge to skilled nursing facility due to ongoing need for acute care including speech, PT OT and assistance with ADLs given his ongoing mental status changes from baseline.  PT is recommending SNF, TOC team working with family to get him transferred over to an SNF in [place].   Assessment & Plan:   Principal Problem:   Aseptic meningitis Active Problems:   Seizure ([place])   Weight loss   Encounter for nasogastric (NG) tube placement   Malnutrition of moderate degree   Aseptic meningitis -Multiple lumbar punctures performed.  Concerns for possible aseptic meningitis.  Initially treated with RIPE which was discontinued.  Plans to continue oral fluconazole until 03/11/2020. -Most of the work-up was negative except some labs are still pending which are send out including  paraneoplastic panel -Seen by neurology and infectious disease -Prednisone taper over 7 days ordered, last day 11/4  Acute ischemic infarct, left thalamic infarct -Acute infarct seen on MRI.  CTA head and neck did not show any acute large vessel occlusion.  Now on Lipitor 80 mg daily.  Currently on aspirin, Plavix on hold in case patient needs repeat LP but will be resumed upon discharge. PT recommending SNF.  Family working with [facility] for placement  5  mm pulmonary nodule, right upper lobe -Follow-up outpatient with PCP  Right eye hematoma -Previously seen by ENT, no need for further intervention at this time.  Medical  management.  Tonic-Clonic Seizures, Status Epilepticus:  -Keppra 1000 mg twice daily  Moderate to severe protein caloric malnutrition -Encourage p.o. intake  Hypophosphatemia -Phosphate repletion.  Daily recheck electrolytes.   HTN/HLD -Continue current meds  Normocytic Anemia Hx of Iron Deficiency Anemia B12 Deficiency  -B12 level slightly low, iron studies normal -Continue B12 supplements  Questionable Crohn's Disease s/p Ileocolic Resection - No longer on immunosuppressive therapy. - Takes budesonide as an outpatient -currently on hold  AKI on CKD stage II, stable -Stable  Overall very weak, needs to go to SNF   DVT prophylaxis: Subcu heparin Code Status: Full code Family Communication: Periodically in touch with Threasa Beards, his daughter  Status is: Inpatient  Remains inpatient appropriate because:Inpatient level of care appropriate due to severity of illness   Dispo: The patient is from: Home              Anticipated d/c is to: SNF              Anticipated d/c date is: Whenever bed available              Patient currently medically cleared, pending safe disposition plan.  TOC team working with the family for placement in [place]-SNF    Body mass index is 19.36 kg/m.      Subjective: Sitting in his bed eating breakfast no complaints   Examination: Constitutional: Not in acute distress Respiratory: Clear to auscultation bilaterally Cardiovascular: Normal sinus rhythm, no rubs Abdomen: Nontender nondistended good bowel sounds Musculoskeletal: No edema noted Skin: Mild laceration above his right eye with hematoma with stitches in place, improving Neurologic: CN 2-12 grossly intact.  And nonfocal Psychiatric: Normal judgment and insight. Alert and oriented x 3. Normal mood.  Objective: Vitals:   03/08/20 1549 03/08/20 2100 03/09/20 0051 03/09/20 0500  BP: 101/60 120/65 117/68 138/78  Pulse: 96 67 61 78  Resp: 18 16 17 18   Temp: 97.7 F  (36.5 C) 97.7 F (36.5 C) 97.6 F (36.4 C) (!) 97.5 F (36.4 C)  TempSrc: Oral Oral Axillary Oral  SpO2: 100% 100% 99% 100%  Weight:    61.2 kg  Height:        Intake/Output Summary (Last 24 hours) at 03/09/2020 0749 Last data filed at 03/09/2020 0431 Gross per 24 hour  Intake 1037 ml  Output 1500 ml  Net -463 ml   Filed Weights   03/07/20 0200 03/08/20 0324 03/09/20 0500  Weight: 62.8 kg 68.7 kg 61.2 kg     Data Reviewed:   CBC: No results for input(s): WBC, NEUTROABS, HGB, HCT, MCV, PLT in the last 168 hours. Basic Metabolic Panel: Recent Labs  Lab 03/03/20 0443 03/05/20 0413 03/06/20 0546 03/07/20 0231 03/08/20 0515 03/09/20 0137  NA 133*  --  136 134*  --  131*  K 4.7  --  4.3 4.2  --  4.9  CL 105  --  106 104  --  100  CO2 22  --  21* 21*  --  20*  GLUCOSE 85  --  100* 129*  --  111*  BUN 28*  --  38* 32*  --  33*  CREATININE 1.46*  --  1.75* 1.36*  --  1.34*  CALCIUM 9.1  --  9.3 9.1  --  9.1  MG 2.2  --  1.3*  --   --  1.7  PHOS  --  3.7  --   --  2.4* 3.6   GFR: Estimated Creatinine Clearance: 43.1 mL/min (A) (by C-G formula based on SCr of 1.34 mg/dL (H)). Liver Function Tests: No results for input(s): AST, ALT, ALKPHOS, BILITOT, PROT, ALBUMIN in the last 168 hours. No results for input(s): LIPASE, AMYLASE in the last 168 hours. No results for input(s): AMMONIA in the last 168 hours. Coagulation Profile: No results for input(s): INR, PROTIME in the last 168 hours. Cardiac Enzymes: No results for input(s): CKTOTAL, CKMB, CKMBINDEX, TROPONINI in the last 168 hours. BNP (last 3 results) No results for input(s): PROBNP in the last 8760 hours. HbA1C: No results for input(s): HGBA1C in the last 72 hours. CBG: Recent Labs  Lab 03/08/20 1135 03/08/20 1547 03/08/20 1955 03/08/20 2358 03/09/20 0419  GLUCAP 175* 144* 139* 127* 104*   Lipid Profile: No results for input(s): CHOL, HDL, LDLCALC, TRIG, CHOLHDL, LDLDIRECT in the last 72  hours. Thyroid Function Tests: No results for input(s): TSH, T4TOTAL, FREET4, T3FREE, THYROIDAB in the last 72 hours. Anemia Panel: No results for input(s): VITAMINB12, FOLATE, FERRITIN, TIBC, IRON, RETICCTPCT in the last 72 hours. Sepsis Labs: No results for input(s): PROCALCITON, LATICACIDVEN in the last 168 hours.  No results found for this or any previous visit (from the past 240 hour(s)).       Radiology Studies: No results found.      Scheduled Meds: . amLODipine  10 mg Oral Daily  . aspirin  81 mg Oral Daily  . atorvastatin  80 mg Oral Daily  . Chlorhexidine Gluconate Cloth  6 each Topical Daily  . docusate  100 mg Oral BID  . feeding supplement (NEPRO CARB STEADY)  237 mL Oral TID BM  . fenofibrate  160 mg Oral Daily  . fluconazole  800 mg Oral Daily  . heparin  5,000 Units Subcutaneous Q8H  . levETIRAcetam  1,000 mg Oral Q12H  . magnesium oxide  800 mg Oral Once  . metoprolol succinate  50 mg Oral Daily  . multivitamin with minerals  1 tablet Oral Daily  . nicotine  14 mg Transdermal Daily  . pantoprazole sodium  40 mg Oral Daily  . polyethylene glycol  17 g Oral Daily  . predniSONE  20 mg Oral Q breakfast   Followed by  . [START ON 03/11/2020] predniSONE  10 mg Oral Q breakfast  . vitamin B-12  1,000 mcg Oral Daily   Continuous Infusions: . sodium chloride Stopped (02/24/20 0116)  . sodium chloride Stopped (02/24/20 0218)  . dextrose 5% lactated ringers 20 mL/hr at 03/03/20 0317     LOS: 25 days   Time spent= 35 mins    Artis Buechele Arsenio Loader, MD [facility]  If 7PM-7AM, please contact night-coverage  03/09/2020, 7:49 AM

## 2020-03-10 DIAGNOSIS — G030 Nonpyogenic meningitis: Secondary | ICD-10-CM | POA: Diagnosis not present

## 2020-03-10 LAB — BASIC METABOLIC PANEL
Anion gap: 11 (ref 5–15)
BUN: 41 mg/dL — ABNORMAL HIGH (ref 8–23)
CO2: 22 mmol/L (ref 22–32)
Calcium: 9.3 mg/dL (ref 8.9–10.3)
Chloride: 99 mmol/L (ref 98–111)
Creatinine, Ser: 1.56 mg/dL — ABNORMAL HIGH (ref 0.61–1.24)
GFR, Estimated: 47 mL/min — ABNORMAL LOW (ref >60)
Glucose, Bld: 79 mg/dL (ref 70–99)
Potassium: 4.7 mmol/L (ref 3.5–5.1)
Sodium: 132 mmol/L — ABNORMAL LOW (ref 135–145)

## 2020-03-10 LAB — GLUCOSE, CAPILLARY
Glucose-Capillary: 112 mg/dL — ABNORMAL HIGH (ref 70–99)
Glucose-Capillary: 119 mg/dL — ABNORMAL HIGH (ref 70–99)
Glucose-Capillary: 121 mg/dL — ABNORMAL HIGH (ref 70–99)
Glucose-Capillary: 144 mg/dL — ABNORMAL HIGH (ref 70–99)
Glucose-Capillary: 68 mg/dL — ABNORMAL LOW (ref 70–99)
Glucose-Capillary: 74 mg/dL (ref 70–99)

## 2020-03-10 LAB — PHOSPHORUS: Phosphorus: 3.4 mg/dL (ref 2.5–4.6)

## 2020-03-10 LAB — MAGNESIUM: Magnesium: 1.6 mg/dL — ABNORMAL LOW (ref 1.7–2.4)

## 2020-03-10 MED ORDER — SODIUM CHLORIDE 0.9 % IV SOLN: INTRAVENOUS | Status: DC

## 2020-03-10 MED ORDER — MAGNESIUM SULFATE 4 GM/100ML IV SOLN
4.0000 g | Freq: Once | INTRAVENOUS | Status: AC
  Administered 2020-03-10: 4 g via INTRAVENOUS
  Filled 2020-03-10: qty 100

## 2020-03-10 NOTE — Progress Notes (Signed)
PROGRESS NOTE    Howard Allen  NFA:[id number] DOB: February 08, 1946 DOA: 02/13/2020 PCP: System, Provider Not In   Brief Narrative:  74 year old Caucasian male with a past medical history significant for Crohn's disease, hypertension, hyperlipidemia, depression history of hospitalization in June 2021 for pneumatosis status post ileocolonic resection and lysis of adhesions who presented with multiple tonic-clonic seizures complicated by status epilepticus that required intubation and loading with Keppra. CT of the head was negative and initially when he was brought in to the ED by EMS there was at least 3 witnessed generalized tonic-clonic seizures noted. PCCM was consulted for admission given that he was intubated on arrival and unresponsive. He was intubated on 02/13/2020 and extubated 02/15/2020. Neurology was consulted and he underwent further work-up and was found to have a signal abnormality in the left limb is most consistent with an acute infarct. Echocardiogram done showed an EF of 55 to 60% with moderate LVH and grade 1 diastolic dysfunction. EEG testing on 10/9 was negative. 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 He underwent several viral serologies and had a lumbar puncture sent on 02/14/2020. He was started on IV vancomycin, IV ceftriaxone, IV ampicillin and IV acyclovir for suspected meningitis. CSF analysis was consistent with aseptic meningitis with high white blood cells and very low glucose level. CSF culture was initally negative. He was transferred to [facility] service on 02/19/2020 and remained confused. Subsequently ID was consulted and they were suspecting aseptic meningoencephalitis and recommended large volume LP. Patient was already on aspirin and Plavix for past 5 days. His Plavix was stopped on 02/20/2020. P2 Y 12 was checked and based on that, neurology performed another large-volume LP on 02/22/2020 and several labs were sent by ID. 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  Given negative findings thus far IV has discontinued RIPE therapy will continue fluconazole 800 mg p.o. daily for 7 more days to cover possible fungal infection.  No longer requiring further evaluation for lumbar puncture for labs per their expertise.  At this point given no further need for invasive work-up as previously considering LP patient is reasonably stable for discharge to skilled nursing facility due to ongoing need for acute care including speech, PT OT and assistance with ADLs given his ongoing mental status changes from baseline.  PT is recommending SNF, TOC team working with family to get him transferred over to an SNF in [place].   Assessment & Plan:   Principal Problem:   Aseptic meningitis Active Problems:   Seizure ([place]ib[place]ones)   Weight loss   Encounter for nasogastric (NG) tube placement   Malnutrition of moderate degree   Aseptic meningitis -Multiple lumbar punctures performed.  Concerns for possible aseptic meningitis.  Initially treated with RIPE which was discontinued.  Plans to continue oral fluconazole until 03/11/2020. -Most of the work-up was negative except some labs are still pending which are send out including  paraneoplastic panel -Seen by neurology and infectious disease -Prednisone taper over 7 days ordered, last day 11/4  Acute ischemic infarct, left thalamic infarct -Acute infarct seen on MRI.  CTA head and neck did not show any acute large vessel occlusion.  Now on Lipitor 80 mg daily.  Currently on aspirin, Plavix on hold in case patient needs repeat LP but will be resumed upon discharge. PT recommending SNF.  Family working with [facility] for placement  5  mm pulmonary nodule, right upper lobe -Follow-up outpatient with PCP  Right eye hematoma -Previously seen by ENT, no need for further intervention at this time.  Medical  management.  Tonic-Clonic Seizures, Status Epilepticus:  -Keppra 1000 mg twice daily  Moderate to severe protein caloric malnutrition -Encourage p.o. intake  Hypophosphatemia -Phosphate repletion.  Daily recheck electrolytes.   HTN/HLD -Continue current meds  Normocytic Anemia Hx of Iron Deficiency Anemia B12 Deficiency  -B12 level slightly low, iron studies normal -Continue B12 supplements  Questionable Crohn's Disease s/p Ileocolic Resection - No longer on immunosuppressive therapy. - Takes budesonide as an outpatient -currently on hold  AKI on CKD stage II, stable -Stable  Overall weak, needs to go to SNF.   DVT prophylaxis: Subcu heparin Code Status: Full code Family Communication: Periodically in touch with his daughter Howard Allen Status is: Inpatient  Remains inpatient appropriate because:Inpatient level of care appropriate due to severity of illness   Dispo: The patient is from: Home              Anticipated d/c is to: SNF              Anticipated d/c date is: Whenever bed available              Patient currently medically cleared, pending safe disposition to SNF in [place].  TOC team working on this  Body mass index is 19.2 kg/m.      Subjective: Patient was sleeping during my visit this morning no complaints.  Not in acute distress.   Examination: Constitutional: Not in acute distress, resting comfortably Respiratory: Clear to auscultation bilaterally Cardiovascular: Normal sinus rhythm, no rubs Abdomen: Nontender nondistended good bowel sounds Musculoskeletal: No edema noted Skin: No rashes seen Neurologic: No focal neuro deficits Psychiatric: Unable to assess as he was sleeping  Objective: Vitals:   03/10/20 0000 03/10/20 0335 03/10/20 0337 03/10/20 0816  BP: 112/77 131/70  137/72  Pulse: 71 61  86  Resp: 19 18  18   Temp: 97.6 F (36.4 C) 97.8 F (36.6 C)  97.6 F (36.4 C)  TempSrc: Oral Axillary  Oral  SpO2: 100% 100%   98%  Weight:   60.7 kg   Height:        Intake/Output Summary (Last 24 hours) at 03/10/2020 0853 Last data filed at 03/10/2020 0521 Gross per 24 hour  Intake 480 ml  Output 600 ml  Net -120 ml   Filed Weights   03/08/20 0324 03/09/20 0500 03/10/20 0337  Weight: 68.7 kg 61.2 kg 60.7 kg     Data Reviewed:   CBC: No results for input(s): WBC, NEUTROABS, HGB, HCT, MCV, PLT in the last 168 hours. Basic Metabolic Panel: Recent Labs  Lab 03/05/20 0413 03/06/20 0546 03/07/20 0231 03/08/20 0515 03/09/20 0137 03/10/20 0211  NA  --  136 134*  --  131* 132*  K  --  4.3 4.2  --  4.9 4.7  CL  --  106 104  --  100 99  CO2  --  21* 21*  --  20* 22  GLUCOSE  --  100* 129*  --  111* 79  BUN  --  38* 32*  --  33* 41*  CREATININE  --  1.75* 1.36*  --  1.34* 1.56*  CALCIUM  --  9.3 9.1  --  9.1 9.3  MG  --  1.3*  --   --  1.7 1.6*  PHOS 3.7  --   --  2.4*  3.6 3.4   GFR: Estimated Creatinine Clearance: 36.7 mL/min (A) (by C-G formula based on SCr of 1.56 mg/dL (H)). Liver Function Tests: No results for input(s): AST, ALT, ALKPHOS, BILITOT, PROT, ALBUMIN in the last 168 hours. No results for input(s): LIPASE, AMYLASE in the last 168 hours. No results for input(s): AMMONIA in the last 168 hours. Coagulation Profile: No results for input(s): INR, PROTIME in the last 168 hours. Cardiac Enzymes: No results for input(s): CKTOTAL, CKMB, CKMBINDEX, TROPONINI in the last 168 hours. BNP (last 3 results) No results for input(s): PROBNP in the last 8760 hours. HbA1C: No results for input(s): HGBA1C in the last 72 hours. CBG: Recent Labs  Lab 03/09/20 1538 03/09/20 2050 03/10/20 0014 03/10/20 0406 03/10/20 0800  GLUCAP 119* 140* 121* 74 68*   Lipid Profile: No results for input(s): CHOL, HDL, LDLCALC, TRIG, CHOLHDL, LDLDIRECT in the last 72 hours. Thyroid Function Tests: No results for input(s): TSH, T4TOTAL, FREET4, T3FREE, THYROIDAB in the last 72 hours. Anemia Panel: No results  for input(s): VITAMINB12, FOLATE, FERRITIN, TIBC, IRON, RETICCTPCT in the last 72 hours. Sepsis Labs: No results for input(s): PROCALCITON, LATICACIDVEN in the last 168 hours.  No results found for this or any previous visit (from the past 240 hour(s)).       Radiology Studies: No results found.      Scheduled Meds: . amLODipine  10 mg Oral Daily  . aspirin  81 mg Oral Daily  . atorvastatin  80 mg Oral Daily  . Chlorhexidine Gluconate Cloth  6 each Topical Daily  . docusate  100 mg Oral BID  . feeding supplement (NEPRO CARB STEADY)  237 mL Oral TID BM  . fenofibrate  160 mg Oral Daily  . fluconazole  800 mg Oral Daily  . heparin  5,000 Units Subcutaneous Q8H  . levETIRAcetam  1,000 mg Oral Q12H  . metoprolol succinate  50 mg Oral Daily  . multivitamin with minerals  1 tablet Oral Daily  . nicotine  14 mg Transdermal Daily  . pantoprazole sodium  40 mg Oral Daily  . polyethylene glycol  17 g Oral Daily  . predniSONE  20 mg Oral Q breakfast   Followed by  . [START ON 03/11/2020] predniSONE  10 mg Oral Q breakfast  . vitamin B-12  1,000 mcg Oral Daily   Continuous Infusions: . sodium chloride Stopped (02/24/20 0116)  . sodium chloride Stopped (02/24/20 0218)  . sodium chloride 75 mL/hr at 03/10/20 0753  . dextrose 5% lactated ringers 20 mL/hr at 03/03/20 0317  . magnesium sulfate bolus IVPB 4 g (03/10/20 0758)     LOS: 26 days   Time spent= 15 mins    Davisha Linthicum Arsenio Loader, MD [facility]  If 7PM-7AM, please contact night-coverage  03/10/2020, 8:53 AM

## 2020-03-10 NOTE — Progress Notes (Signed)
Physical Therapy Treatment Patient Details Name: Howard Allen MRN: [id number] DOB: 1945-11-13 Today's Date: 03/10/2020    History of Present Illness Pt is a 74 year old male who was having multiple seizures and has a history of falls. 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 NIH 4 on 02/15/20 and then 14 on 02/17/20. Medical hx consisting of Chron's disease and HTN.    PT Comments    Pt resting upon PT arrival to room, wakes and is agreeable to therapy when prompted. Pt ambulatory in hallway today with min assist to maintain balance, especially when changing directions or maneuvering RW around obstacles. Pt still demonstrates difficulty widening BOS and clearing R foot when walking, requires max cuing throughout mobility for form and safety. PT continuing to recommend SNF level of care post-acutely, will continue to follow while acute.     Follow Up Recommendations  Supervision/Assistance - 24 hour;SNF     Equipment Recommendations  Rolling walker with 5" wheels;3in1 (PT);Wheelchair (measurements PT);Wheelchair cushion (measurements PT);Hospital bed (vs defer to next venue)    Recommendations for Other Services       Precautions / Restrictions Precautions Precautions: Fall Precaution Comments: h/o seizures Restrictions Weight Bearing Restrictions: No    Mobility  Bed Mobility Overal bed mobility: Needs Assistance Bed Mobility: Supine to Sit     Supine to sit: Min assist     General bed mobility comments: min assist for progression LEs to EOB, increased time and effort with use of bedrail to  perform.  Transfers Overall transfer level: Needs assistance Equipment used: Rolling walker (2 wheeled) Transfers: Sit to/from Stand Sit to Stand: Mod assist;Min assist         General transfer comment: Mod assist for power up from low surface of recliner, more like min assist from EOB when bed height elevated. Verbal cuing for hand placement when rising/sitting.  Ambulation/Gait Ambulation/Gait assistance: Min assist Gait Distance (Feet): 60 Feet Assistive device: Rolling walker (2 wheeled) Gait Pattern/deviations: Step-through pattern;Decreased stride length;Narrow base of support;Trunk flexed;Shuffle Gait velocity: decr   General Gait Details: Min assist to steady, guide RW especially during directional changes, facilitate swing phase RLE. Verbal cuing for increasing step length and R foot clearance, upright posture, placement in RW.   Stairs             Wheelchair Mobility    Modified Rankin (Stroke Patients Only) Modified Rankin (Stroke Patients Only) Pre-Morbid Rankin Score: No symptoms Modified Rankin: Moderately severe disability     Balance Overall balance assessment: History of Falls;Needs assistance Sitting-balance support: Single extremity supported;Feet supported Sitting balance-Leahy Scale: Fair     Standing balance support: Bilateral upper extremity supported Standing balance-Leahy Scale: Poor Standing balance comment: reliant on RW and PT assist in standing                            Cognition Arousal/Alertness: Awake/alert Behavior During Therapy: Flat affect Overall Cognitive Status: Impaired/Different from baseline Area of Impairment: Attention;Memory;Following commands;Safety/judgement;Awareness;Problem solving                   Current Attention  Level: Selective Memory: Decreased recall of precautions;Decreased short-term memory Following Commands: Follows one step commands with increased time Safety/Judgement:  Decreased awareness of safety;Decreased awareness of deficits Awareness: Intellectual Problem Solving: Slow processing;Decreased initiation;Difficulty sequencing;Requires verbal cues;Requires tactile cues General Comments: very flat affect, follows commands at times requiring multimodal cuing      Exercises General Exercises - Lower Extremity Long Arc Quad: AROM;Both;10 reps;Seated Hip Flexion/Marching: AROM;Both;10 reps;Standing    General Comments        Pertinent Vitals/Pain Pain Assessment: Faces Faces Pain Scale: Hurts a little bit Pain Location: abdomen Pain Descriptors / Indicators: Grimacing;Discomfort Pain Intervention(s): Limited activity within patient's tolerance;Repositioned;Monitored during session    Home Living                      Prior Function            PT Goals (current goals can now be found in the care plan section) Acute Rehab PT Goals Patient Stated Goal: to go home PT Goal Formulation: With patient Time For Goal Achievement: 03/18/20 Potential to Achieve Goals: Good Progress towards PT goals: Progressing toward goals    Frequency    Min 3X/week      PT Plan Current plan remains appropriate    Co-evaluation              AM-PAC PT "6 Clicks" Mobility   Outcome Measure  Help needed turning from your back to your side while in a flat bed without using bedrails?: A Little Help needed moving from lying on your back to sitting on the side of a flat bed without using bedrails?: A Little Help needed moving to and from a bed to a chair (including a wheelchair)?: A Little Help needed standing up from a chair using your arms (e.g., wheelchair or bedside chair)?: A Little Help needed to walk in hospital room?: A Little Help needed climbing 3-5 steps with a railing? : A Lot 6 Click Score: 17    End of Session Equipment Utilized During Treatment: Gait belt Activity Tolerance: Patient tolerated treatment well;Patient limited by  fatigue Patient left: with nursing/sitter in room;in bed;with bed alarm set;with call bell/phone within reach Nurse Communication: Mobility status PT Visit Diagnosis: Unsteadiness on feet (R26.81);Other abnormalities of gait and mobility (R26.89);Muscle weakness (generalized) (M62.81);History of falling (Z91.81);Difficulty in walking, not elsewhere classified (R26.2);Other symptoms and signs involving the nervous system (R29.898)     Time: 2426-8341 PT Time Calculation (min) (ACUTE ONLY): 18 min  Charges:  $Gait Training: 8-22 mins                    Alexis Reber E, PT Acute Rehabilitation Services Pager [phone]  Office [phone]    Jovaughn Wojtaszek D Elonda Husky 03/10/2020, 12:58 PM

## 2020-03-10 NOTE — Plan of Care (Signed)
  Problem: Education: Goal: Knowledge of disease or condition will improve Outcome: Progressing Goal: Knowledge of secondary prevention will improve Outcome: Progressing Goal: Knowledge of patient specific risk factors addressed and post discharge goals established will improve Outcome: Progressing Goal: Individualized Educational Video(s) Outcome: Progressing   Problem: Coping: Goal: Will verbalize positive feelings about self Outcome: Progressing Goal: Will identify appropriate support needs Outcome: Progressing   Problem: Elimination: Goal: Will not experience complications related to bowel motility Outcome: Progressing Goal: Will not experience complications related to urinary retention Outcome: Progressing   Problem: Skin Integrity: Goal: Risk for impaired skin integrity will decrease Outcome: Progressing

## 2020-03-11 DIAGNOSIS — G030 Nonpyogenic meningitis: Secondary | ICD-10-CM | POA: Diagnosis not present

## 2020-03-11 LAB — GLUCOSE, CAPILLARY
Glucose-Capillary: 123 mg/dL — ABNORMAL HIGH (ref 70–99)
Glucose-Capillary: 123 mg/dL — ABNORMAL HIGH (ref 70–99)
Glucose-Capillary: 126 mg/dL — ABNORMAL HIGH (ref 70–99)
Glucose-Capillary: 127 mg/dL — ABNORMAL HIGH (ref 70–99)
Glucose-Capillary: 71 mg/dL (ref 70–99)
Glucose-Capillary: 89 mg/dL (ref 70–99)

## 2020-03-11 LAB — PHOSPHORUS: Phosphorus: 3.7 mg/dL (ref 2.5–4.6)

## 2020-03-11 LAB — BASIC METABOLIC PANEL
Anion gap: 10 (ref 5–15)
BUN: 37 mg/dL — ABNORMAL HIGH (ref 8–23)
CO2: 22 mmol/L (ref 22–32)
Calcium: 9.1 mg/dL (ref 8.9–10.3)
Chloride: 102 mmol/L (ref 98–111)
Creatinine, Ser: 1.33 mg/dL — ABNORMAL HIGH (ref 0.61–1.24)
GFR, Estimated: 57 mL/min — ABNORMAL LOW (ref >60)
Glucose, Bld: 87 mg/dL (ref 70–99)
Potassium: 3.8 mmol/L (ref 3.5–5.1)
Sodium: 134 mmol/L — ABNORMAL LOW (ref 135–145)

## 2020-03-11 LAB — MAGNESIUM: Magnesium: 2.2 mg/dL (ref 1.7–2.4)

## 2020-03-11 MED ORDER — ENSURE ENLIVE PO LIQD
237.0000 mL | Freq: Two times a day (BID) | ORAL | Status: DC
  Administered 2020-03-11 – 2020-03-16 (×11): 237 mL via ORAL

## 2020-03-11 NOTE — Progress Notes (Addendum)
Nutrition Follow-up  DOCUMENTATION CODES:   Non-severe (moderate) malnutrition in context of chronic illness  INTERVENTION:  D/c Nepro  Ensure Enlive po BID, each supplement provides 350 kcal and 20 grams of protein  NUTRITION DIAGNOSIS:   Moderate Malnutrition related to chronic illness as evidenced by mild fat depletion, moderate fat depletion, mild muscle depletion, moderate muscle depletion.  ongoing  GOAL:   Patient will meet greater than or equal to 90% of their needs  progressing  MONITOR:   PO intake, Supplement acceptance, Labs, Weight trends  REASON FOR ASSESSMENT:   Consult Assessment of nutrition requirement/status (Weight in January 2021 was 196 lbs. Gradual weight loss over the last 10 months due to lack of appetite.)  ASSESSMENT:   74 yo male presents with 1 week of falls, hallucinations and admitted with recurrent seizures and status epilepticus requiring intubation. PMH includes hx of Crohn's, prior SBO, HTN, HLD, depression  10/7 LP consistent with meningitis 10/8 extubated 10/11 diet advanced to dysphagia 2 with honey thick liquids  10/15 large volume LPpositive for candida 10/21 diet advanced to dysphagia 3 with nectar thick liquids 10/25 diet advanced to dysphagia 3 with thin liquids  Pt is pending d/c to SNF in [place].   Pt with good appetite since last RD assessment. Meal completions charted as 50-100% x last 8 recorded meals (75% average intake). Pt also with orders for Nepro shakes TID which he typically accepts per RN. Now that pt's phosphorus is WNL, will transition pt to Ensure and reduce frequency given improved po intake.   UOP: 42ml x24 hours  Labs: Na 134 (L), CBGs 89-127 Medications: colace, MVI, protonix, deltasone, miralax, vitamin b12  Diet Order:   Diet Order            DIET DYS 3 Room service appropriate? Yes with Assist; Fluid consistency: Thin  Diet effective now                 EDUCATION NEEDS:   No  education needs have been identified at this time  Skin:  Skin Assessment: Skin Integrity Issues: Skin Integrity Issues:: Incisions, Other (Comment) Incisions: face Other: skin tear L arm  Last BM:  10/31  Height:   Ht Readings from Last 1 Encounters:  02/13/20 5\' 10"  (1.778 m)    Weight:   Wt Readings from Last 1 Encounters:  03/10/20 60.7 kg   BMI:  Body mass index is 19.2 kg/m.  Estimated Nutritional Needs:   Kcal:  1785-1965 kcal  Protein:  90-100 grams  Fluid:  >/= 1.8 L    Larkin Ina, MS, RD, LDN RD pager number and weekend/on-call pager number located in [place].

## 2020-03-11 NOTE — Progress Notes (Signed)
  Speech Language Pathology Treatment: Dysphagia;Cognitive-Linquistic  Patient Details Name: Howard Allen MRN: [id number] DOB: 1946/04/27 Today's Date: 03/11/2020 Time: 7341-9379 SLP Time Calculation (min) (ACUTE ONLY): 12 min  Assessment / Plan / Recommendation Clinical Impression  Pt was seen for swallowing/cognitive tx. He was sleepy, requiring encouragement to participate.  Did not recall chin tuck when drinking thin liquids, so required verbal cues and was then able to carry-over 80% of the time. No overt s/s of aspiration, however prior studies revealed tendency to silently aspirate.  Pt may benefit from another MBS prior to D/C.  He required verbal cues for orientation to day, date, and location.  He was unable to describe any impairments as a result of his medical condition.  He completed tasks of verbal recall (events, objects) with 70% accuracy after verbal cues and recognition.  Continue SLP here and in next venue of care.   HPI HPI: 74 y.o. 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        SLP Plan  Continue with current plan of care       Recommendations  Diet recommendations: Dysphagia 3 (mechanical soft);Thin liquid Liquids provided via: Cup;Straw Medication Administration: Crushed with puree Supervision: Patient able to self feed;Full supervision/cueing for compensatory strategies Compensations: Minimize environmental distractions;Slow rate;Small sips/bites;Chin tuck Postural Changes and/or Swallow Maneuvers: Seated upright 90 degrees                Oral Care Recommendations: Oral care BID Follow up Recommendations: Skilled Nursing facility SLP  Visit Diagnosis: Dysphagia, oropharyngeal phase (R13.12) Plan: Continue with current plan of care       GO               Abbygael Curtiss L. Tivis Ringer, [place] CCC/SLP Acute Rehabilitation Services Office number [phone] Pager [phone]  Juan Quam Laurice 03/11/2020, 11:10 AM

## 2020-03-11 NOTE — Plan of Care (Signed)
  Problem: Education: Goal: Knowledge of disease or condition will improve Outcome: Progressing Goal: Knowledge of secondary prevention will improve Outcome: Progressing Goal: Knowledge of patient specific risk factors addressed and post discharge goals established will improve Outcome: Progressing Goal: Individualized Educational Video(s) Outcome: Progressing   Problem: Coping: Goal: Will verbalize positive feelings about self Outcome: Progressing Goal: Will identify appropriate support needs Outcome: Progressing   Problem: Elimination: Goal: Will not experience complications related to bowel motility Outcome: Progressing Goal: Will not experience complications related to urinary retention Outcome: Progressing   Problem: Skin Integrity: Goal: Risk for impaired skin integrity will decrease Outcome: Progressing

## 2020-03-11 NOTE — Progress Notes (Signed)
Physical Therapy Treatment Patient Details Name: Howard Allen MRN: [id number] DOB: 07/07/1945 Today's Date: 03/11/2020    History of Present Illness Pt is a 74 year old male who was having multiple seizures and has a history of falls. 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 NIH 4 on 02/15/20 and then 14 on 02/17/20. Medical hx consisting of Chron's disease and HTN.    PT Comments    Pt seemed rather depressed of mood today, asking when he will be released from the hospital. Pt in wet bed and not attending to this, also had bowel movement while walking with no realization. Limited ambulation that could be done today. Pt needed min A to come to EOB. Stood with min A but immediately had posterior lean and fear of falling that resulted in him sitting back down. Mod A and vc's given on second trial and pt remained standing. R foot ineffective stepping with ambulation with significant R lean. Began with tactile and visual cues for R step and progressed to vc's. Without cues, pt returned to R stutter step and would fall without assist. Continue to recommend SNF at d/c as pt would not be safe alone. PT will continue to follow.    Follow Up Recommendations  Supervision/Assistance - 24 hour;SNF     Equipment Recommendations  Rolling walker with 5" wheels;3in1 (PT);Wheelchair (measurements PT);Wheelchair cushion (measurements PT);Hospital bed (vs defer to next venue)    Recommendations for Other Services       Precautions / Restrictions Precautions Precautions: Fall Precaution Comments: h/o seizures Restrictions Weight Bearing  Restrictions: No    Mobility  Bed Mobility Overal bed mobility: Needs Assistance Bed Mobility: Supine to Sit     Supine to sit: Min assist     General bed mobility comments: min A to initiate sup to sit, pt able to get LE's off bed but needed min HHA for elevation of trunk into sitting. vc's needed to scoott o EOB  Transfers Overall transfer level: Needs assistance Equipment used: Rolling walker (2 wheeled) Transfers: Sit to/from Stand Sit to Stand: Min assist         General transfer comment: vc's for hand placement though pt did not heed. Min A for power up but pt returned to sitting due to posterior lean and fear of falling. Stood again and the same thing began to happen but cued pt to wait and bring wt fwd and he was able to maintain standing  Ambulation/Gait Ambulation/Gait assistance: Min assist;Mod assist Gait Distance (Feet): 30 Feet Assistive device: Rolling walker (2 wheeled) Gait Pattern/deviations: Step-through pattern;Narrow base of support;Trunk flexed;Decreased weight shift to right;Decreased stance time - right Gait velocity: decr Gait velocity interpretation: <1.31 ft/sec, indicative of household ambulator General Gait Details: pt taking tiny steps RLE. Began with tactile cues and visual goals for step length and progressed to verbal cue only. But pt stopped effectively stepping RLE when not cued. Pt heavily reliant on RW and min A to keep from falling   Stairs             Wheelchair Mobility    Modified Rankin (Stroke Patients Only) Modified Rankin (Stroke  Patients Only) Pre-Morbid Rankin Score: No symptoms Modified Rankin: Moderately severe disability     Balance Overall balance assessment: History of Falls;Needs assistance Sitting-balance support: Single extremity supported;Feet supported Sitting balance-Leahy Scale: Fair Sitting balance - Comments: BUE support in static sitting   Standing balance support: Bilateral upper extremity  supported Standing balance-Leahy Scale: Poor Standing balance comment: reliant on RW and PT assist in standing                            Cognition Arousal/Alertness: Awake/alert Behavior During Therapy: Flat affect Overall Cognitive Status: Impaired/Different from baseline Area of Impairment: Attention;Memory;Following commands;Safety/judgement;Awareness;Problem solving                 Orientation Level: Disoriented to;Time;Situation Current Attention Level: Selective Memory: Decreased recall of precautions;Decreased short-term memory Following Commands: Follows one step commands with increased time Safety/Judgement: Decreased awareness of safety;Decreased awareness of deficits Awareness: Intellectual Problem Solving: Slow processing;Decreased initiation;Difficulty sequencing;Requires verbal cues;Requires tactile cues General Comments: very flat affect, follows commands at times requiring multimodal cuing      Exercises      General Comments General comments (skin integrity, edema, etc.): pt having bowel movement while ambulating, unaware. Limited distance he could ambulate today      Pertinent Vitals/Pain Pain Assessment: No/denies pain    Home Living                      Prior Function            PT Goals (current goals can now be found in the care plan section) Acute Rehab PT Goals Patient Stated Goal: to go home PT Goal Formulation: With patient Time For Goal Achievement: 03/18/20 Potential to Achieve Goals: Good Progress towards PT goals: Progressing toward goals    Frequency    Min 3X/week      PT Plan Current plan remains appropriate    Co-evaluation              AM-PAC PT "6 Clicks" Mobility   Outcome Measure  Help needed turning from your back to your side while in a flat bed without using bedrails?: A Little Help needed moving from lying on your back to sitting on the side of a flat bed without using bedrails?: A  Little Help needed moving to and from a bed to a chair (including a wheelchair)?: A Little Help needed standing up from a chair using your arms (e.g., wheelchair or bedside chair)?: A Lot Help needed to walk in hospital room?: A Lot Help needed climbing 3-5 steps with a railing? : A Lot 6 Click Score: 15    End of Session Equipment Utilized During Treatment: Gait belt Activity Tolerance: Other (comment) (limited by BM while walking, need to clean up floor and him) Patient left: with nursing/sitter in room;with call bell/phone within reach;in chair;with chair alarm set Nurse Communication: Mobility status PT Visit Diagnosis: Unsteadiness on feet (R26.81);Other abnormalities of gait and mobility (R26.89);Muscle weakness (generalized) (M62.81);History of falling (Z91.81);Difficulty in walking, not elsewhere classified (R26.2);Other symptoms and signs involving the nervous system (R29.898)     Time: 5400-8676 PT Time Calculation (min) (ACUTE ONLY): 20 min  Charges:  $Gait Training: 8-22 mins                     Leighton Roach, [place]  Pager [phone] Office [place] 03/11/2020, 1:51  PM   

## 2020-03-11 NOTE — TOC Progression Note (Signed)
Transition of Care [facility]) - Progression Note    Patient Details  Name: Howard Allen MRN: [id number] Date of Birth: [birth year]  Transition of Care [facility]) CM/SW Contact  Joanne Chars, LCSW Phone Number: 03/11/2020, 3:15 PM  Clinical Narrative:  Pt referral faxed to [facility].  CSW spoke with Colletta [place] in admissions who will review.     Expected Discharge Plan: [place] Barriers to Discharge: Continued Medical Work up  Expected Discharge Plan and Services Expected Discharge Plan: [place] Acute Care Choice: IP Rehab Living arrangements for the past 2 months: Single Family Home                                       Social Determinants of Health (SDOH) Interventions    Readmission Risk Interventions No flowsheet data found.

## 2020-03-11 NOTE — Progress Notes (Signed)
PROGRESS NOTE    Howard Allen  VPX:[id number] DOB: July 01, 1945 DOA: 02/13/2020 PCP: System, Provider Not In   Brief Narrative:  74 year old Caucasian male with a past medical history significant for Crohn's disease, hypertension, hyperlipidemia, depression history of hospitalization in June 2021 for pneumatosis status post ileocolonic resection and lysis of adhesions who presented with multiple tonic-clonic seizures complicated by status epilepticus that required intubation and loading with Keppra. CT of the head was negative and initially when he was brought in to the ED by EMS there was at least 3 witnessed generalized tonic-clonic seizures noted. PCCM was consulted for admission given that he was intubated on arrival and unresponsive. He was intubated on 02/13/2020 and extubated 02/15/2020. Neurology was consulted and he underwent further work-up and was found to have a signal abnormality in the left limb is most consistent with an acute infarct. Echocardiogram done showed an EF of 55 to 60% with moderate LVH and grade 1 diastolic dysfunction. EEG testing on 10/9 was negative. 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 He underwent several viral serologies and had a lumbar puncture sent on 02/14/2020. He was started on IV vancomycin, IV ceftriaxone, IV ampicillin and IV acyclovir for suspected meningitis. CSF analysis was consistent with aseptic meningitis with high white blood cells and very low glucose level. CSF culture was initally negative. He was transferred to [facility] service on 02/19/2020 and remained confused. Subsequently ID was consulted and they were suspecting aseptic meningoencephalitis and recommended large volume LP. Patient was already on aspirin and Plavix for past 5 days. His Plavix was stopped on 02/20/2020. P2 Y 12 was checked and based on that, neurology performed another large-volume LP on 02/22/2020 and several labs were sent by ID. 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  Given negative findings thus far IV has discontinued RIPE therapy will continue fluconazole 800 mg p.o. daily for 7 more days to cover possible fungal infection.  No longer requiring further evaluation for lumbar puncture for labs per their expertise.  At this point given no further need for invasive work-up as previously considering LP patient is reasonably stable for discharge to skilled nursing facility due to ongoing need for acute care including speech, PT OT and assistance with ADLs given his ongoing mental status changes from baseline.  PT is recommending SNF, TOC team working with family to get him transferred over to an SNF in [place].   Assessment & Plan:   Principal Problem:   Aseptic meningitis Active Problems:   Seizure ([place])   Weight loss   Encounter for nasogastric (NG) tube placement   Malnutrition of moderate degree   Aseptic meningitis -Multiple lumbar punctures performed.  Concerns for possible aseptic meningitis.  Initially treated with RIPE which was discontinued.  Plans to continue oral fluconazole until 03/11/2020. -Most of the work-up was negative except some labs are still pending which are send out including  paraneoplastic panel -Seen by neurology and infectious disease -Prednisone taper over 7 days ordered, last day would be 11/4  Acute ischemic infarct, left thalamic infarct -Acute infarct seen on MRI.  CTA head and neck did not show any acute large vessel occlusion.  Now on Lipitor 80 mg daily.  Currently on aspirin, Plavix on hold in case patient needs repeat LP but will be resumed upon discharge. PT-SNF, family working with TOC for placement  5 mm  pulmonary nodule, right upper lobe -Follow-up outpatient with PCP  Right eye hematoma -Previously seen by ENT, no need for further intervention at this time.  Medical  management.  Tonic-Clonic Seizures, Status Epilepticus:  -Keppra 1000 mg twice daily  Moderate to severe protein caloric malnutrition -Encourage p.o. intake  Hypophosphatemia -Phosphate repletion.  Daily recheck electrolytes.   HTN/HLD -Continue current meds  Normocytic Anemia Hx of Iron Deficiency Anemia B12 Deficiency  -B12 level slightly low, iron studies normal -Continue B12 supplements  Questionable Crohn's Disease s/p Ileocolic Resection - No longer on immunosuppressive therapy. - Takes budesonide as an outpatient -currently on hold  AKI on CKD stage II, stable -Stable  Overall very weak needs to go to SNF. He understands this as he lives at home alone.   DVT prophylaxis: Subcu heparin Code Status: Full code Family Communication: Periodically his daughter has been updated Status is: Inpatient  Remains inpatient appropriate because:Inpatient level of care appropriate due to severity of illness   Dispo: The patient is from: Home              Anticipated d/c is to: SNF              Anticipated d/c date is: Whenever bed available              Patient currently medically cleared, pending safe disposition to SNF in [place].  TOC team working on this  Body mass index is 19.2 kg/m.      Subjective: Sitting up in the bed, no complaints. Eagerly waiting to be discharged   Examination: Constitutional: Not in acute distress Respiratory: Clear to auscultation bilaterally Cardiovascular: Normal sinus rhythm, no rubs Abdomen: Nontender nondistended good bowel sounds Musculoskeletal: No edema noted Skin: Area of hematoma noted above his right eye with stitches Neurologic: CN 2-12 grossly intact.  And nonfocal Psychiatric: Normal judgment and insight. Alert and oriented x 3. Normal mood.  Objective: Vitals:   03/10/20 1956 03/11/20 0013 03/11/20 0417 03/11/20 0813  BP: 114/67 119/71 120/81 122/72  Pulse: 72 70 68 85  Resp: 18 18 20 16   Temp: 98 F  (36.7 C) 98.1 F (36.7 C) 97.7 F (36.5 C) 97.9 F (36.6 C)  TempSrc: Oral Oral Oral Oral  SpO2: 100% 100% 100% 100%  Weight:      Height:        Intake/Output Summary (Last 24 hours) at 03/11/2020 1032 Last data filed at 03/11/2020 0453 Gross per 24 hour  Intake 2717 ml  Output 450 ml  Net 2267 ml   Filed Weights   03/08/20 0324 03/09/20 0500 03/10/20 0337  Weight: 68.7 kg 61.2 kg 60.7 kg     Data Reviewed:   CBC: No results for input(s): WBC, NEUTROABS, HGB, HCT, MCV, PLT in the last 168 hours. Basic Metabolic Panel: Recent Labs  Lab 03/05/20 0413 03/06/20 0546 03/07/20 0231 03/08/20 0515 03/09/20 0137 03/10/20 0211 03/11/20 0447  NA  --  136 134*  --  131* 132* 134*  K  --  4.3 4.2  --  4.9 4.7 3.8  CL  --  106 104  --  100 99 102  CO2  --  21* 21*  --  20* 22 22  GLUCOSE  --  100* 129*  --  111* 79 87  BUN  --  38* 32*  --  33* 41* 37*  CREATININE  --  1.75* 1.36*  --  1.34* 1.56* 1.33*  CALCIUM  --  9.3 9.1  --  9.1 9.3 9.1  MG  --  1.3*  --   --  1.7 1.6* 2.2  PHOS 3.7  --   --  2.4* 3.6 3.4 3.7   GFR: Estimated Creatinine Clearance: 43.1 mL/min (A) (by C-G formula based on SCr of 1.33 mg/dL (H)). Liver Function Tests: No results for input(s): AST, ALT, ALKPHOS, BILITOT, PROT, ALBUMIN in the last 168 hours. No results for input(s): LIPASE, AMYLASE in the last 168 hours. No results for input(s): AMMONIA in the last 168 hours. Coagulation Profile: No results for input(s): INR, PROTIME in the last 168 hours. Cardiac Enzymes: No results for input(s): CKTOTAL, CKMB, CKMBINDEX, TROPONINI in the last 168 hours. BNP (last 3 results) No results for input(s): PROBNP in the last 8760 hours. HbA1C: No results for input(s): HGBA1C in the last 72 hours. CBG: Recent Labs  Lab 03/10/20 1542 03/10/20 1957 03/11/20 0013 03/11/20 0418 03/11/20 0811  GLUCAP 112* 144* 127* 89 71   Lipid Profile: No results for input(s): CHOL, HDL, LDLCALC, TRIG, CHOLHDL,  LDLDIRECT in the last 72 hours. Thyroid Function Tests: No results for input(s): TSH, T4TOTAL, FREET4, T3FREE, THYROIDAB in the last 72 hours. Anemia Panel: No results for input(s): VITAMINB12, FOLATE, FERRITIN, TIBC, IRON, RETICCTPCT in the last 72 hours. Sepsis Labs: No results for input(s): PROCALCITON, LATICACIDVEN in the last 168 hours.  No results found for this or any previous visit (from the past 240 hour(s)).       Radiology Studies: No results found.      Scheduled Meds: . amLODipine  10 mg Oral Daily  . aspirin  81 mg Oral Daily  . atorvastatin  80 mg Oral Daily  . Chlorhexidine Gluconate Cloth  6 each Topical Daily  . docusate  100 mg Oral BID  . feeding supplement  237 mL Oral BID BM  . fenofibrate  160 mg Oral Daily  . fluconazole  800 mg Oral Daily  . heparin  5,000 Units Subcutaneous Q8H  . levETIRAcetam  1,000 mg Oral Q12H  . metoprolol succinate  50 mg Oral Daily  . multivitamin with minerals  1 tablet Oral Daily  . nicotine  14 mg Transdermal Daily  . pantoprazole sodium  40 mg Oral Daily  . polyethylene glycol  17 g Oral Daily  . predniSONE  10 mg Oral Q breakfast  . vitamin B-12  1,000 mcg Oral Daily   Continuous Infusions: . sodium chloride Stopped (02/24/20 0116)  . sodium chloride Stopped (02/24/20 0218)  . dextrose 5% lactated ringers 20 mL/hr at 03/03/20 0317     LOS: 27 days   Time spent= 15 mins    Will Schier Arsenio Loader, MD [facility]  If 7PM-7AM, please contact night-coverage  03/11/2020, 10:32 AM

## 2020-03-11 NOTE — Progress Notes (Signed)
Occupational Therapy Treatment Patient Details Name: Howard Allen MRN: [id number] DOB: 05-15-1945 Today's Date: 03/11/2020    History of present illness Pt is a 74 year old male who was having multiple seizures and has a history of falls. 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 NIH 4 on 02/15/20 and then 14 on 02/17/20. Medical hx consisting of Chron's disease and HTN.   OT comments  Patient continues to make steady progress towards goals in skilled OT session. Patient's session encompassed ADLs, further assessment of cognition in functional tasks, and functional ambulation. Pt with increased ataxia in all movement, and poor motor planning, requiring mod A in order to complete, difficulty following commands to navigate environment and ADLs. Pt frequently running into bed and other environmental components, requiring therapist to navigate for pt. Pt with difficulty sequencing teeth brushing, requiring step by step cues (attempted to drink water instead of rinse mouth with need for therapist intervention). Pt perseverating on going back to bed, but encouraged to stay up for a little longer as PT had just finished not 30 minutes prior. Discharge remains appropriate, will continue to follow acutely.    Follow Up Recommendations  SNF    Equipment Recommendations  Other (comment) (defer to next venue)    Recommendations for Other Services      Precautions / Restrictions Precautions Precautions: Fall Precaution Comments: h/o seizures Restrictions Weight Bearing Restrictions: No       Mobility Bed Mobility Overal bed  mobility: Needs Assistance Bed Mobility: Supine to Sit     Supine to sit: Min assist     General bed mobility comments: up in recliner upon arrival  Transfers Overall transfer level: Needs assistance Equipment used: Rolling walker (2 wheeled) Transfers: Sit to/from Stand Sit to Stand: Min assist         General transfer comment: pt with significant time to motor plan sit<>stand transfer, kept sliding out to edge of chair instead of attempting to stand up, requiring HOH A in order to have appropriate placement of hands, and unsteady in all ambulation not heeding cues to pay attention to gait (scissoring) or surroundings    Balance Overall balance assessment: History of Falls;Needs assistance Sitting-balance support: Single extremity supported;Feet supported Sitting balance-Leahy Scale: Fair Sitting balance - Comments: BUE support in static sitting   Standing balance support: Bilateral upper extremity supported Standing balance-Leahy Scale: Poor Standing balance comment: reliant on RW and OTA assist in standing                           ADL either performed or assessed with clinical judgement   ADL Overall ADL's : Needs assistance/impaired     Grooming: Wash/dry hands;Wash/dry face;Oral care;Moderate assistance;Cueing for sequencing;Standing Grooming Details (indicate cue type and reason): tremulous movements, unsteady in standing requiring increased assist, cues for appropriate sequencing                 Toilet Transfer: RW;Ambulation;Moderate assistance Toilet Transfer Details (indicate cue type and reason): simulated with transfers to chair and ambulation in room to sink         Functional  mobility during ADLs: Moderate assistance;Rolling walker;Cueing for sequencing;Cueing for safety General ADL Comments: Pt with increased ataxia in all movement, and poor motor planning, requiring mod A in order to complete, difficulty following commands to navigate  environment and ADLs     Vision       Perception     Praxis      Cognition Arousal/Alertness: Awake/alert Behavior During Therapy: Flat affect Overall Cognitive Status: Impaired/Different from baseline Area of Impairment: Attention;Memory;Following commands;Safety/judgement;Awareness;Problem solving;Orientation                 Orientation Level: Disoriented to;Time;Situation Current Attention Level: Selective Memory: Decreased recall of precautions;Decreased short-term memory Following Commands: Follows one step commands with increased time Safety/Judgement: Decreased awareness of safety;Decreased awareness of deficits Awareness: Intellectual Problem Solving: Slow processing;Decreased initiation;Difficulty sequencing;Requires verbal cues;Requires tactile cues General Comments: very flat affect, follows commands at times requiring multimodal cuing        Exercises     Shoulder Instructions       General Comments pt having bowel movement while ambulating, unaware. Limited distance he could ambulate today    Pertinent Vitals/ Pain       Pain Assessment: No/denies pain  Home Living                                          Prior Functioning/Environment              Frequency  Min 2X/week        Progress Toward Goals  OT Goals(current goals can now be found in the care plan section)  Progress towards OT goals: Progressing toward goals  Acute Rehab OT Goals Patient Stated Goal: to go home OT Goal Formulation: With patient/family Time For Goal Achievement: 03/20/20 Potential to Achieve Goals: [place] Discharge plan remains appropriate    Co-evaluation                 AM-PAC OT "6 Clicks" Daily Activity     Outcome Measure   Help from another person eating meals?: A Little Help from another person taking care of personal grooming?: A Lot Help from another person toileting, which includes using toliet, bedpan, or  urinal?: A Lot Help from another person bathing (including washing, rinsing, drying)?: A Lot Help from another person to put on and taking off regular upper body clothing?: A Lot Help from another person to put on and taking off regular lower body clothing?: A Lot 6 Click Score: 13    End of Session Equipment Utilized During Treatment: Gait belt;Rolling walker  OT Visit Diagnosis: Unsteadiness on feet (R26.81);History of falling (Z91.81);Muscle weakness (generalized) (M62.81);Other symptoms and signs involving the nervous system (R29.898);Other symptoms and signs involving cognitive function;Pain   Activity Tolerance Patient tolerated treatment well;Patient limited by fatigue   Patient Left in chair;with call bell/phone within reach;with chair alarm set   Nurse Communication Mobility status        Time: 0272-5366 OT Time Calculation (min): 25 min  Charges: OT General Charges $OT Visit: 1 Visit OT Treatments $Self Care/Home Management : 23-37 mins  Corinne [place] E. [place], [place] [facility] [place] 03/11/2020, 2:58 PM

## 2020-03-12 DIAGNOSIS — G030 Nonpyogenic meningitis: Secondary | ICD-10-CM | POA: Diagnosis not present

## 2020-03-12 LAB — BASIC METABOLIC PANEL
Anion gap: 11 (ref 5–15)
BUN: 38 mg/dL — ABNORMAL HIGH (ref 8–23)
CO2: 23 mmol/L (ref 22–32)
Calcium: 9.3 mg/dL (ref 8.9–10.3)
Chloride: 100 mmol/L (ref 98–111)
Creatinine, Ser: 1.5 mg/dL — ABNORMAL HIGH (ref 0.61–1.24)
GFR, Estimated: 49 mL/min — ABNORMAL LOW (ref >60)
Glucose, Bld: 96 mg/dL (ref 70–99)
Potassium: 3.7 mmol/L (ref 3.5–5.1)
Sodium: 134 mmol/L — ABNORMAL LOW (ref 135–145)

## 2020-03-12 LAB — MAGNESIUM: Magnesium: 1.8 mg/dL (ref 1.7–2.4)

## 2020-03-12 LAB — GLUCOSE, CAPILLARY
Glucose-Capillary: 100 mg/dL — ABNORMAL HIGH (ref 70–99)
Glucose-Capillary: 103 mg/dL — ABNORMAL HIGH (ref 70–99)
Glucose-Capillary: 151 mg/dL — ABNORMAL HIGH (ref 70–99)
Glucose-Capillary: 153 mg/dL — ABNORMAL HIGH (ref 70–99)
Glucose-Capillary: 76 mg/dL (ref 70–99)
Glucose-Capillary: 82 mg/dL (ref 70–99)

## 2020-03-12 LAB — PHOSPHORUS: Phosphorus: 3.6 mg/dL (ref 2.5–4.6)

## 2020-03-12 MED ORDER — CLOPIDOGREL BISULFATE 75 MG PO TABS
75.0000 mg | ORAL_TABLET | Freq: Every day | ORAL | Status: DC
  Administered 2020-03-12 – 2020-03-17 (×6): 75 mg via ORAL
  Filled 2020-03-12 (×6): qty 1

## 2020-03-12 NOTE — Progress Notes (Signed)
Physical Therapy Treatment Patient Details Name: Howard Allen MRN: [id number] DOB: [birth year] Today's Date: 03/12/2020    History of Present Illness Pt is a 74 year old male who was having multiple seizures and has a history of falls. 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 NIH 4 on 02/15/20 and then 14 on 02/17/20. Medical hx consisting of Chron's disease and HTN.    PT Comments    Pt tolerates treatment well, although has limited tolerance for OOB mobility due to muscular fatigue. Pt continues to demonstrate R foot drag, resulting in multiple losses of balance. Pt is intermittently able to clear foot, but with fatigue the pt is unable to clear foot for swing through phase of gait cycle. PT attempts to encourage performance of LE HEP to improve LE endurance. Pt will benefit from continued acute PT POC to reduce falls risk and to aide in a return to his PLOF. PT continues to recommend SNF placement at this time.   Follow Up Recommendations  Supervision/Assistance - 24 hour;SNF     Equipment Recommendations  Rolling walker with 5" wheels;3in1 (PT);Wheelchair (measurements PT);Wheelchair cushion (measurements PT);Hospital bed    Recommendations for Other Services       Precautions / Restrictions Precautions Precautions: Fall Precaution Comments: h/o seizures Restrictions Weight Bearing Restrictions: No    Mobility  Bed Mobility Overal bed mobility: Needs Assistance Bed Mobility: Rolling;Sidelying to Sit Rolling: Supervision (verbal cues) Sidelying to sit: Min assist       General bed mobility comments: minA  to bring hips closer to edge of bed after roll, verbal cues for technique and sequencing  Transfers Overall transfer level: Needs assistance Equipment used: Rolling walker (2 wheeled) Transfers: Sit to/from Stand Sit to Stand: Min assist Stand pivot transfers: Min assist       General transfer comment: minA to power up into standing, verbal cues for hand placement, foot placement, and for forward trunk lean  Ambulation/Gait Ambulation/Gait assistance: Min assist Gait Distance (Feet): 20 Feet (20' x 2) Assistive device: Rolling walker (2 wheeled) Gait Pattern/deviations: Step-to pattern;Decreased dorsiflexion - right Gait velocity: reduced Gait velocity interpretation: <1.31 ft/sec, indicative of household ambulator General Gait Details: pt with R foot drag, initially able to increase foot clearance with PT verbal cues but unable due to fatigue during 2nd trial.   Stairs             Wheelchair Mobility    Modified Rankin (Stroke Patients Only) Modified Rankin (Stroke Patients Only) Pre-Morbid Rankin Score: No symptoms Modified Rankin: Moderately severe disability     Balance Overall balance assessment: Needs assistance Sitting-balance support: Feet supported;No upper extremity supported Sitting balance-Leahy Scale: Good Sitting balance - Comments: supervision   Standing balance support: Single extremity supported;Bilateral upper extremity supported Standing balance-Leahy Scale: Poor Standing balance comment: reliant on UE support of RW                            Cognition Arousal/Alertness: Awake/alert Behavior During Therapy: Flat affect Overall Cognitive Status: Impaired/Different from baseline Area of Impairment: Safety/judgement;Awareness;Problem solving  Safety/Judgement: Decreased awareness of safety Awareness: Emergent Problem Solving: Slow processing;Requires verbal cues;Requires tactile cues         Exercises General Exercises - Lower Extremity Ankle Circles/Pumps: AROM;Both;10 reps Gluteal Sets: AROM;Both;10 reps Straight Leg Raises: AROM;Both;10 reps Hip Flexion/Marching: AROM;Both;5 reps    General Comments General comments (skin integrity, edema, etc.): VSS on RA      Pertinent Vitals/Pain Pain Assessment: No/denies pain    Home Living                      Prior Function            PT Goals (current goals can now be found in the care plan section) Acute Rehab PT Goals Patient Stated Goal: to go home Progress towards PT goals: Progressing toward goals    Frequency    Min 3X/week      PT Plan Current plan remains appropriate    Co-evaluation              AM-PAC PT "6 Clicks" Mobility   Outcome Measure  Help needed turning from your back to your side while in a flat bed without using bedrails?: A Little Help needed moving from lying on your back to sitting on the side of a flat bed without using bedrails?: A Little Help needed moving to and from a bed to a chair (including a wheelchair)?: A Little Help needed standing up from a chair using your arms (e.g., wheelchair or bedside chair)?: A Little Help needed to walk in hospital room?: A Little Help needed climbing 3-5 steps with a railing? : A Lot 6 Click Score: 17    End of Session Equipment Utilized During Treatment: Gait belt Activity Tolerance: Patient tolerated treatment well Patient left: in chair;with call bell/phone within reach;with chair alarm set Nurse Communication: Mobility status PT Visit Diagnosis: Unsteadiness on feet (R26.81);Other abnormalities of gait and mobility (R26.89);Muscle weakness (generalized) (M62.81);History of falling (Z91.81);Difficulty in walking, not elsewhere classified (R26.2);Other symptoms and signs involving the nervous system (R29.898)     Time: 1530-1550 PT Time Calculation (min) (ACUTE ONLY): 20 min  Charges:  $Gait Training: 8-22 mins                      Zenaida Niece, PT, DPT Acute Rehabilitation Pager: [phone]    Zenaida Niece 03/12/2020, 4:07 PM

## 2020-03-12 NOTE — TOC Progression Note (Addendum)
Transition of Care [facility]) - Progression Note    Patient Details  Name: Diandre Merica MRN: [id number] Date of Birth: [birth year]  [facility] of [facility]) CM/SW Contact  Joanne Chars, LCSW Phone Number: 03/12/2020, 12:57 PM  Clinical Narrative:   CSW spoke to daughter Melody and gave her update.   Other efforts to locate SNF: [facility], [phone].  Message left with Cecille Rubin in admissions. [facility]: CSW reached Tammy in admissions on her cell: [phone].  Referral faxed to [phone]. Main facility [phone] [facility]: CSW received call back from [place] in admissions.  She has reviewed referral and can offer bed but will not have available bed until early next week.  She will call tomorrow if anything changes. She also asked if family would be transporting?    Expected Discharge Plan: [place] Barriers to Discharge: Continued Medical Work up  Expected Discharge Plan and Services Expected Discharge Plan: [place] Acute Care Choice: IP Rehab Living arrangements for the past 2 months: Single Family Home                                       Social Determinants of Health (SDOH) Interventions    Readmission Risk Interventions No flowsheet data found.

## 2020-03-12 NOTE — Progress Notes (Signed)
PROGRESS NOTE  Howard Allen  DOB: 08-Jun-1945  PCP: System, Provider Not In TFT:[phone]  DOA: 02/13/2020  LOS: 28 days   Chief Complaint  Patient presents with  . Seizures    Brief narrative: 74 year old Caucasian male with PMH significant for for Crohn's disease, hypertension, hyperlipidemia, depression, history of hospitalization in June 2021 for pneumatosis status post ileocolonic resection and lysis of adhesions. Patient was brought to the ED on 10/6 with multiple tonic-clonic seizures including 3 in the ED.  Complicated by status epilepticus that required intubation and loading with Keppra.  CT of the head was negative.  Patient was admitted to ICU.  Extubated on 10/8.  MRI brain showed a signal abnormality in the left limb most consistent with an acute infarct.  Neurology and infectious disease consulted.  EEG testing was negative.  10/7, patient underwent lumbar puncture. CSF analysis was consistent with aseptic meningitis with high white blood cells and very low glucose level.  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  Because of persistent impaired mental status, large volume LP was recommended. 10/15, patient underwent large-volume LP. Negative infectious disease work-up so far.  Candida was positive in the CSF sample but per ID it is most likely not the cause of infection.  In any case, patient is currently on fluconazole 800 mg daily to cover for possible fungal infection. Mental status improving but still not back to baseline.  PT OT, ST evaluation were obtained.  SNF transfer recommended.  Patient is from [place] and is currently waiting for bed availability over there.  Subjective: Patient was seen and examined this morning.  Pleasant, elderly Caucasian male.  Not in physical distress.  Remains confused.  Assessment/Plan: Aseptic meningitis -Multiple lumbar punctures performed.  Concerns for possible aseptic meningitis.  Initially treated with  RIPE which was discontinued.  Plan was to continue oral fluconazole until 03/11/2020. -Most of the work-up was negative except some labs are still pending which are send out including  paraneoplastic panel -Seen by neurology and infectious disease -Prednisone taper over 7 days, last day would be 11/4  Acute ischemic infarct, left thalamic infarct -Acute infarct seen on MRI.  CTA head and neck did not show any acute large vessel occlusion.  Now on Lipitor 80 mg daily.  Currently on aspirin. Okay to resume Plavix.   5 mm pulmonary nodule, right upper lobe -Follow-up outpatient with PCP  Right eye hematoma -Previously seen by ENT, no need for further intervention at this time.  Medical management.  Tonic-Clonic Seizures, Status Epilepticus -Keppra 1000 mg twice daily  Moderate to severe protein caloric malnutrition -Encourage p.o. intake  HTN/HLD -Continue current meds  Normocytic Anemia Hx of Iron Deficiency Anemia B12 Deficiency  -Continue B12 supplements -Hemoglobin stable. Recent Labs    02/16/20 0729 02/16/20 0729 02/16/20 1019 02/16/20 1116 02/17/20 0329 02/17/20 0329 02/18/20 0419 02/21/20 0814  HGB 8.5*  --   --  8.5* 8.9*  --  9.5* 10.4*  MCV 88.7   < >  --   --  88.7   < > 87.0 88.6  VITAMINB12  --   --  146*  --   --   --   --   --   FOLATE  --   --  6.5  --   --   --   --   --   TIBC  --   --  321  --   --   --   --   --  IRON  --   --  51  --   --   --   --   --    < > = values in this interval not displayed.   Questionable Crohn's Disease s/p Ileocolic Resection - No longer on immunosuppressive therapy. - Takes budesonide as an outpatient -currently on hold  AKI on CKD stage II, stable -Stable Recent Labs    02/29/20 0824 03/01/20 0211 03/02/20 0252 03/03/20 0443 03/06/20 0546 03/07/20 0231 03/09/20 0137 03/10/20 0211 03/11/20 0447 03/12/20 0455  BUN 32* 34* 28* 28* 38* 32* 33* 41* 37* 38*  CREATININE 1.42* 1.40* 1.44* 1.46*  1.75* 1.36* 1.34* 1.56* 1.33* 1.50*   Mobility: PT eval obtained. Code Status:   Code Status: Full Code  Nutritional status: Body mass index is 19.2 kg/m. Nutrition Problem: Moderate Malnutrition Etiology: chronic illness Signs/Symptoms: mild fat depletion, moderate fat depletion, mild muscle depletion, moderate muscle depletion Diet Order            DIET DYS 3 Room service appropriate? Yes with Assist; Fluid consistency: Thin  Diet effective now                 DVT prophylaxis: heparin injection 5,000 Units Start: 02/13/20 2200 SCDs Start: 02/13/20 1737   Antimicrobials:  Completed the course Fluid: None Consultants: Neurology, ID Family Communication:  Not at bedside  Status is: Inpatient  Remains inpatient appropriate because:Unsafe d/c plan   Dispo: The patient is from: Home              Anticipated d/c is to: SNF              Anticipated d/c date is: Whenever bed is available              Patient currently is medically stable to d/c.       Infusions:  . sodium chloride Stopped (02/24/20 0116)  . sodium chloride Stopped (02/24/20 0218)  . dextrose 5% lactated ringers 20 mL/hr at 03/03/20 0317    Scheduled Meds: . amLODipine  10 mg Oral Daily  . aspirin  81 mg Oral Daily  . atorvastatin  80 mg Oral Daily  . Chlorhexidine Gluconate Cloth  6 each Topical Daily  . docusate  100 mg Oral BID  . feeding supplement  237 mL Oral BID BM  . fenofibrate  160 mg Oral Daily  . fluconazole  800 mg Oral Daily  . heparin  5,000 Units Subcutaneous Q8H  . levETIRAcetam  1,000 mg Oral Q12H  . metoprolol succinate  50 mg Oral Daily  . multivitamin with minerals  1 tablet Oral Daily  . nicotine  14 mg Transdermal Daily  . pantoprazole sodium  40 mg Oral Daily  . polyethylene glycol  17 g Oral Daily  . predniSONE  10 mg Oral Q breakfast  . vitamin B-12  1,000 mcg Oral Daily    Antimicrobials: Anti-infectives (From admission, onward)   Start     Dose/Rate Route  Frequency Ordered Stop   03/04/20 1000  fluconazole (DIFLUCAN) tablet 800 mg        800 mg Oral Daily 03/03/20 1159 03/18/20 2359   02/26/20 1115  rifampin (RIFADIN) 600 mg in sodium chloride 0.9 % 100 mL IVPB  Status:  Discontinued        600 mg 200 mL/hr over 30 Minutes Intravenous Every 24 hours 02/26/20 1026 03/03/20 1141   02/23/20 0100  acyclovir (ZOVIRAX) 615 mg in dextrose 5 % 100 mL IVPB  Status:  Discontinued        10 mg/kg  61.3 kg 112.3 mL/hr over 60 Minutes Intravenous Every 12 hours 02/22/20 1537 02/24/20 0936   02/22/20 2100  amphotericin B liposome (AMBISOME) 310 mg in dextrose 5 % 500 mL IVPB  Status:  Discontinued        5 mg/kg  61.3 kg 288.8 mL/hr over 120 Minutes Intravenous Every 24 hours 02/22/20 1524 03/03/20 1159   02/22/20 1800  rifampin (RIFADIN) capsule 600 mg  Status:  Discontinued        600 mg Oral Daily 02/22/20 1520 02/26/20 1026   02/22/20 1800  isoniazid (NYDRAZID) tablet 300 mg  Status:  Discontinued        300 mg Oral Daily 02/22/20 1520 03/03/20 1141   02/22/20 1800  pyrazinamide tablet 1,500 mg  Status:  Discontinued        1,500 mg Oral Daily 02/22/20 1520 03/03/20 1141   02/22/20 1800  ethambutol (MYAMBUTOL) tablet 1,200 mg  Status:  Discontinued        1,200 mg Oral Daily 02/22/20 1520 03/03/20 1141   02/20/20 1000  vancomycin (VANCOCIN) IVPB 1000 mg/200 mL premix  Status:  Discontinued        1,000 mg 200 mL/hr over 60 Minutes Intravenous Every 24 hours 02/20/20 0555 02/21/20 1259   02/19/20 1811  vancomycin variable dose per unstable renal function (pharmacist dosing)  Status:  Discontinued         Does not apply See admin instructions 02/19/20 1811 02/20/20 0555   02/18/20 0500  vancomycin (VANCOREADY) IVPB 750 mg/150 mL  Status:  Discontinued        750 mg 150 mL/hr over 60 Minutes Intravenous Every 12 hours 02/17/20 1651 02/19/20 1811   02/15/20 0300  vancomycin (VANCOREADY) IVPB 500 mg/100 mL        500 mg 100 mL/hr over 60 Minutes  Intravenous Every 12 hours 02/14/20 1352 02/17/20 1755   02/14/20 1400  acyclovir (ZOVIRAX) 690 mg in dextrose 5 % 100 mL IVPB  Status:  Discontinued        10 mg/kg  68.9 kg 113.8 mL/hr over 60 Minutes Intravenous Every 12 hours 02/14/20 1245 02/22/20 1537   02/14/20 1400  vancomycin (VANCOREADY) IVPB 1250 mg/250 mL        1,250 mg 166.7 mL/hr over 90 Minutes Intravenous STAT 02/14/20 1352 02/14/20 1655   02/14/20 1300  cefTRIAXone (ROCEPHIN) 2 g in sodium chloride 0.9 % 100 mL IVPB  Status:  Discontinued        2 g 200 mL/hr over 30 Minutes Intravenous Every 12 hours 02/14/20 1245 02/20/20 1732   02/14/20 1300  ampicillin (OMNIPEN) 2 g in sodium chloride 0.9 % 100 mL IVPB  Status:  Discontinued        2 g 300 mL/hr over 20 Minutes Intravenous Every 6 hours 02/14/20 1245 02/18/20 1051      PRN meds: sodium chloride, albuterol, docusate, Gerhardt's butt cream, labetalol, Resource ThickenUp Clear   Objective: Vitals:   03/12/20 0409 03/12/20 0907  BP: 122/66 (!) 103/51  Pulse: (!) 58 92  Resp: 18 18  Temp: 98.4 F (36.9 C) 97.9 F (36.6 C)  SpO2: 100% 100%    Intake/Output Summary (Last 24 hours) at 03/12/2020 1155 Last data filed at 03/12/2020 0409 Gross per 24 hour  Intake --  Output 600 ml  Net -600 ml   Filed Weights   03/08/20 0324 03/09/20 0500 03/10/20 0337  Weight: 68.7  kg 61.2 kg 60.7 kg   Weight change:  Body mass index is 19.2 kg/m.   Physical Exam: General exam: Appears calm and comfortable.  Not in physical distress Skin: No rashes, lesions or ulcers. HEENT: Atraumatic, normocephalic, supple neck, no obvious bleeding Lungs: Clear to auscultation bilaterally CVS: Regular rate and rhythm, no murmur GI/Abd soft, nontender, nondistended, bowel sound present CNS: Alert, awake, confused, knows he is in a hospital Psychiatry: Mood appropriate Extremities: No pedal edema, no calf tenderness  Data Review: I have personally reviewed the laboratory data and  studies available.  No results for input(s): WBC, NEUTROABS, HGB, HCT, MCV, PLT in the last 168 hours. Recent Labs  Lab 03/06/20 0546 03/06/20 0546 03/07/20 0231 03/08/20 0515 03/09/20 0137 03/10/20 0211 03/11/20 0447 03/12/20 0455  NA 136   < > 134*  --  131* 132* 134* 134*  K 4.3   < > 4.2  --  4.9 4.7 3.8 3.7  CL 106   < > 104  --  100 99 102 100  CO2 21*   < > 21*  --  20* 22 22 23   GLUCOSE 100*   < > 129*  --  111* 79 87 96  BUN 38*   < > 32*  --  33* 41* 37* 38*  CREATININE 1.75*   < > 1.36*  --  1.34* 1.56* 1.33* 1.50*  CALCIUM 9.3   < > 9.1  --  9.1 9.3 9.1 9.3  MG 1.3*  --   --   --  1.7 1.6* 2.2 1.8  PHOS  --   --   --  2.4* 3.6 3.4 3.7 3.6   < > = values in this interval not displayed.    F/u labs ordered  Signed, Terrilee Croak, MD [facility] 03/12/2020

## 2020-03-13 DIAGNOSIS — G030 Nonpyogenic meningitis: Secondary | ICD-10-CM | POA: Diagnosis not present

## 2020-03-13 LAB — MAGNESIUM: Magnesium: 1.4 mg/dL — ABNORMAL LOW (ref 1.7–2.4)

## 2020-03-13 LAB — GLUCOSE, CAPILLARY
Glucose-Capillary: 115 mg/dL — ABNORMAL HIGH (ref 70–99)
Glucose-Capillary: 95 mg/dL (ref 70–99)
Glucose-Capillary: 140 mg/dL — ABNORMAL HIGH (ref 70–99)
Glucose-Capillary: 143 mg/dL — ABNORMAL HIGH (ref 70–99)
Glucose-Capillary: 89 mg/dL (ref 70–99)
Glucose-Capillary: 109 mg/dL — ABNORMAL HIGH (ref 70–99)

## 2020-03-13 LAB — BASIC METABOLIC PANEL
Anion gap: 12 (ref 5–15)
BUN: 38 mg/dL — ABNORMAL HIGH (ref 8–23)
CO2: 22 mmol/L (ref 22–32)
Calcium: 9.1 mg/dL (ref 8.9–10.3)
Chloride: 101 mmol/L (ref 98–111)
Creatinine, Ser: 1.48 mg/dL — ABNORMAL HIGH (ref 0.61–1.24)
GFR, Estimated: 50 mL/min — ABNORMAL LOW (ref >60)
Glucose, Bld: 115 mg/dL — ABNORMAL HIGH (ref 70–99)
Potassium: 3.7 mmol/L (ref 3.5–5.1)
Sodium: 135 mmol/L (ref 135–145)

## 2020-03-13 LAB — PHOSPHORUS: Phosphorus: 3.4 mg/dL (ref 2.5–4.6)

## 2020-03-13 MED ORDER — METOPROLOL SUCCINATE ER 100 MG PO TB24
100.0000 mg | ORAL_TABLET | Freq: Every day | ORAL | Status: DC
  Administered 2020-03-13 – 2020-03-14 (×2): 100 mg via ORAL
  Filled 2020-03-13 (×3): qty 1

## 2020-03-13 NOTE — Progress Notes (Signed)
  Speech Language Pathology Treatment: Dysphagia  Patient Details Name: Howard Allen MRN: [id number] DOB: 1946/05/02 Today's Date: 03/13/2020 Time: 0950-1010 SLP Time Calculation (min) (ACUTE ONLY): 20 min  Assessment / Plan / Recommendation Clinical Impression  Skilled treatment with intake of thin via straw, puree and soft solids with use of chin tuck requiring mod verbal/visual cues initially as pt unable to recall strategy when asked.  He was taking medications prior to ST session beginning and not using chin tuck technique independently during consumption.  He completed chin tuck partially when given mod verbal/visual cues with 40% accuracy; 70% with tactile cue provided as well.  Pt noted to exhibit decreased alertness level this session and required more cueing than previous session to interact with SLP.  Low vocal quality noted as well during brief simple conversation answering personal information.  Pt only oriented to self.  MBS may be indicated prior to d/c chin tuck technique d/t hx of silent aspiration (per previous MBS) in conjunction with impaired cognition.  MBS not completed this date d/t pt's LOA. Despite decreased alertness level and risk for aspiration, he did not exhibit any overt s/s of aspiration with any consistency, although intake was limited during this session.  ST will f/u for MBS need prior to d/c to ensure he is not silently aspirating and is safe with current diet.     HPI HPI: 74 y.o. 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  MRI remarkable for " signal abnormality in the left thalamus most consistent with an acute infarct" and Signal abnormality in the mesial left temporal lobe and splenium  Pt was intubated from 10/6-10/8      SLP Plan  Continue with current plan of care;MBS       Recommendations  Diet recommendations: Dysphagia 3  (mechanical soft);Thin liquid Liquids provided via: Straw Medication Administration: Other (Comment) (chin tuck with meds whole/thin) Supervision: Patient able to self feed;Full supervision/cueing for compensatory strategies Compensations: Minimize environmental distractions;Slow rate;Small sips/bites;Chin tuck Postural Changes and/or Swallow Maneuvers: Chin tuck                Oral Care Recommendations: Oral care BID Follow up Recommendations: Other (comment) (TBD) SLP Visit Diagnosis: Dysphagia, oropharyngeal phase (R13.12) Plan: Continue with current plan of care;MBS                       Elvina Sidle, M.S., CCC-SLP 03/13/2020, 12:44 PM

## 2020-03-13 NOTE — Plan of Care (Signed)
Patient is experiencing intermittent confusion. Not progressing towards goals at this time.  Problem: Education: Goal: Knowledge of disease or condition will improve Outcome: Not Progressing Goal: Knowledge of secondary prevention will improve Outcome: Not Progressing Goal: Knowledge of patient specific risk factors addressed and post discharge goals established will improve Outcome: Not Progressing Goal: Individualized Educational Video(s) Outcome: Not Progressing   Problem: Coping: Goal: Will verbalize positive feelings about self Outcome: Not Progressing Goal: Will identify appropriate support needs Outcome: Not Progressing   Problem: Ischemic Stroke/TIA Tissue Perfusion: Goal: Complications of ischemic stroke/TIA will be minimized Outcome: Not Progressing   Problem: Education: Goal: Knowledge of General Education information will improve Description: Including pain rating scale, medication(s)/side effects and non-pharmacologic comfort measures Outcome: Not Progressing   Problem: Health Behavior/Discharge Planning: Goal: Ability to manage health-related needs will improve Outcome: Not Progressing   Problem: Clinical Measurements: Goal: Ability to maintain clinical measurements within normal limits will improve Outcome: Not Progressing Goal: Will remain free from infection Outcome: Not Progressing Goal: Diagnostic test results will improve Outcome: Not Progressing Goal: Respiratory complications will improve Outcome: Not Progressing Goal: Cardiovascular complication will be avoided Outcome: Not Progressing   Problem: Activity: Goal: Risk for activity intolerance will decrease Outcome: Not Progressing   Problem: Nutrition: Goal: Adequate nutrition will be maintained Outcome: Not Progressing   Problem: Coping: Goal: Level of anxiety will decrease Outcome: Not Progressing   Problem: Elimination: Goal: Will not experience complications related to bowel  motility Outcome: Not Progressing Goal: Will not experience complications related to urinary retention Outcome: Not Progressing   Problem: Pain Managment: Goal: General experience of comfort will improve Outcome: Not Progressing   Problem: Safety: Goal: Ability to remain free from injury will improve Outcome: Not Progressing   Problem: Skin Integrity: Goal: Risk for impaired skin integrity will decrease Outcome: Not Progressing

## 2020-03-13 NOTE — Progress Notes (Signed)
PROGRESS NOTE  Howard Allen  DOB: August 13, 1945  PCP: System, Provider Not In IWL:[id number]  DOA: 02/13/2020  LOS: 29 days   Chief Complaint  Patient presents with  . Seizures    Brief narrative: 74 year old Caucasian male with PMH significant for for Crohn's disease, hypertension, hyperlipidemia, depression, history of hospitalization in June 2021 for pneumatosis status post ileocolonic resection and lysis of adhesions. Patient was brought to the ED on 10/6 with multiple tonic-clonic seizures including 3 in the ED.  Complicated by status epilepticus that required intubation and loading with Keppra.  CT of the head was negative.  Patient was admitted to ICU.  Extubated on 10/8.  MRI brain showed a signal abnormality in the left limb most consistent with an acute infarct.  Neurology and infectious disease consulted.  EEG testing was negative.  10/7, patient underwent lumbar puncture. CSF analysis was consistent with aseptic meningitis with high white blood cells and very low glucose level.  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  Because of persistent impaired mental status, large volume LP was recommended. 10/15, patient underwent large-volume LP. Negative infectious disease work-up so far.  Candida was positive in the CSF sample but per ID it is most likely not the cause of infection.  In any case, patient is currently on fluconazole 800 mg daily to cover for possible fungal infection. Mental status improving but still not back to baseline.  PT OT, ST evaluation were obtained.  SNF transfer recommended.  Patient is from [place] and is currently waiting for bed availability over there.  Subjective: Patient was seen and examined this morning.   Not in distress.  No new symptoms.   Patient had 2-3 episodes of PSVTs this morning.  Heart rate up to 170 but patient was asymptomatic.    Assessment/Plan: Aseptic meningitis -Multiple lumbar punctures performed.   Concerns for possible aseptic meningitis.  Initially treated with RIPE which was discontinued.  Plan was to continue oral fluconazole until 03/11/2020. -Most of the work-up was negative except some labs which are send out including  paraneoplastic panel -Seen by neurology and infectious disease -Prednisone taper over 7 days to complete today on 11/4.  Acute ischemic infarct, left thalamic infarct -MRI brain from 10/7 showed acute infarct of the left thalamus. CTA head and neck did not show any acute large vessel occlusion.   -Currently on aspirin 81 mg daily, Plavix 75 mg daily and Lipitor 80 mg daily.    5 mm pulmonary nodule, right upper lobe -Follow-up outpatient with PCP  Right eye hematoma -Previously seen by ENT, no need for further intervention at this time.  Medical management. -Patient has sutures in place, likely placed in ED.  We will get it removed today.  Tonic-Clonic Seizures, Status Epilepticus -Keppra 1000 mg twice daily  Moderate to severe protein caloric malnutrition -Encourage p.o. intake  PSVT Essential hypertension -Patient had 2-3 episodes of PSVT this morning, asymptomatic. -At home, patient was on 100 mg of Toprol daily.  In the hospital, patient has been receiving only 50 mg of Toprol.  I increased it to 100 mg this morning.  I held amlodipine. Resume if blood pressure rises up.  Normocytic Anemia Hx of Iron Deficiency Anemia B12 Deficiency  -Continue B12 supplements -Hemoglobin stable. Recent Labs    02/16/20 0729 02/16/20 0729 02/16/20 1019 02/16/20 1116 02/17/20 0329 02/17/20 0329 02/18/20 0419 02/21/20 0814  HGB 8.5*  --   --  8.5* 8.9*  --  9.5*  10.4*  MCV 88.7   < >  --   --  88.7   < > 87.0 88.6  VITAMINB12  --   --  146*  --   --   --   --   --   FOLATE  --   --  6.5  --   --   --   --   --   TIBC  --   --  321  --   --   --   --   --   IRON  --   --  51  --   --   --   --   --    < > = values in this interval not displayed.    Questionable Crohn's Disease s/p Ileocolic Resection - No longer on immunosuppressive therapy. - Takes budesonide as an outpatient -currently on hold  AKI on CKD stage II, stable -Stable Recent Labs    03/01/20 0211 03/02/20 0252 03/03/20 0443 03/06/20 0546 03/07/20 0231 03/09/20 0137 03/10/20 0211 03/11/20 0447 03/12/20 0455 03/13/20 0123  BUN 34* 28* 28* 38* 32* 33* 41* 37* 38* 38*  CREATININE 1.40* 1.44* 1.46* 1.75* 1.36* 1.34* 1.56* 1.33* 1.50* 1.48*   Mobility: PT eval obtained. Code Status:   Code Status: Full Code  Nutritional status: Body mass index is 19.36 kg/m. Nutrition Problem: Moderate Malnutrition Etiology: chronic illness Signs/Symptoms: mild fat depletion, moderate fat depletion, mild muscle depletion, moderate muscle depletion Diet Order            DIET DYS 3 Room service appropriate? Yes with Assist; Fluid consistency: Thin  Diet effective now                 DVT prophylaxis: heparin injection 5,000 Units Start: 02/13/20 2200 SCDs Start: 02/13/20 1737   Antimicrobials:  Completed the course Fluid: None Consultants: Neurology, ID Family Communication:  Not at bedside  Status is: Inpatient  Remains inpatient appropriate because:Unsafe d/c plan   Dispo: The patient is from: Home              Anticipated d/c is to: SNF at [place]              Anticipated d/c date is: On Monday 11/8              Patient currently is medically stable to d/c.   Infusions:  . sodium chloride Stopped (02/24/20 0116)  . sodium chloride Stopped (02/24/20 0218)    Scheduled Meds: . aspirin  81 mg Oral Daily  . atorvastatin  80 mg Oral Daily  . Chlorhexidine Gluconate Cloth  6 each Topical Daily  . clopidogrel  75 mg Oral Daily  . docusate  100 mg Oral BID  . feeding supplement  237 mL Oral BID BM  . fenofibrate  160 mg Oral Daily  . heparin  5,000 Units Subcutaneous Q8H  . levETIRAcetam  1,000 mg Oral Q12H  . metoprolol succinate  100 mg Oral  Daily  . multivitamin with minerals  1 tablet Oral Daily  . nicotine  14 mg Transdermal Daily  . pantoprazole sodium  40 mg Oral Daily  . polyethylene glycol  17 g Oral Daily  . vitamin B-12  1,000 mcg Oral Daily    Antimicrobials: Anti-infectives (From admission, onward)   Start     Dose/Rate Route Frequency Ordered Stop   03/04/20 1000  fluconazole (DIFLUCAN) tablet 800 mg  Status:  Discontinued  800 mg Oral Daily 03/03/20 1159 03/12/20 1323   02/26/20 1115  rifampin (RIFADIN) 600 mg in sodium chloride 0.9 % 100 mL IVPB  Status:  Discontinued        600 mg 200 mL/hr over 30 Minutes Intravenous Every 24 hours 02/26/20 1026 03/03/20 1141   02/23/20 0100  acyclovir (ZOVIRAX) 615 mg in dextrose 5 % 100 mL IVPB  Status:  Discontinued        10 mg/kg  61.3 kg 112.3 mL/hr over 60 Minutes Intravenous Every 12 hours 02/22/20 1537 02/24/20 0936   02/22/20 2100  amphotericin B liposome (AMBISOME) 310 mg in dextrose 5 % 500 mL IVPB  Status:  Discontinued        5 mg/kg  61.3 kg 288.8 mL/hr over 120 Minutes Intravenous Every 24 hours 02/22/20 1524 03/03/20 1159   02/22/20 1800  rifampin (RIFADIN) capsule 600 mg  Status:  Discontinued        600 mg Oral Daily 02/22/20 1520 02/26/20 1026   02/22/20 1800  isoniazid (NYDRAZID) tablet 300 mg  Status:  Discontinued        300 mg Oral Daily 02/22/20 1520 03/03/20 1141   02/22/20 1800  pyrazinamide tablet 1,500 mg  Status:  Discontinued        1,500 mg Oral Daily 02/22/20 1520 03/03/20 1141   02/22/20 1800  ethambutol (MYAMBUTOL) tablet 1,200 mg  Status:  Discontinued        1,200 mg Oral Daily 02/22/20 1520 03/03/20 1141   02/20/20 1000  vancomycin (VANCOCIN) IVPB 1000 mg/200 mL premix  Status:  Discontinued        1,000 mg 200 mL/hr over 60 Minutes Intravenous Every 24 hours 02/20/20 0555 02/21/20 1259   02/19/20 1811  vancomycin variable dose per unstable renal function (pharmacist dosing)  Status:  Discontinued         Does not apply See  admin instructions 02/19/20 1811 02/20/20 0555   02/18/20 0500  vancomycin (VANCOREADY) IVPB 750 mg/150 mL  Status:  Discontinued        750 mg 150 mL/hr over 60 Minutes Intravenous Every 12 hours 02/17/20 1651 02/19/20 1811   02/15/20 0300  vancomycin (VANCOREADY) IVPB 500 mg/100 mL        500 mg 100 mL/hr over 60 Minutes Intravenous Every 12 hours 02/14/20 1352 02/17/20 1755   02/14/20 1400  acyclovir (ZOVIRAX) 690 mg in dextrose 5 % 100 mL IVPB  Status:  Discontinued        10 mg/kg  68.9 kg 113.8 mL/hr over 60 Minutes Intravenous Every 12 hours 02/14/20 1245 02/22/20 1537   02/14/20 1400  vancomycin (VANCOREADY) IVPB 1250 mg/250 mL        1,250 mg 166.7 mL/hr over 90 Minutes Intravenous STAT 02/14/20 1352 02/14/20 1655   02/14/20 1300  cefTRIAXone (ROCEPHIN) 2 g in sodium chloride 0.9 % 100 mL IVPB  Status:  Discontinued        2 g 200 mL/hr over 30 Minutes Intravenous Every 12 hours 02/14/20 1245 02/20/20 1732   02/14/20 1300  ampicillin (OMNIPEN) 2 g in sodium chloride 0.9 % 100 mL IVPB  Status:  Discontinued        2 g 300 mL/hr over 20 Minutes Intravenous Every 6 hours 02/14/20 1245 02/18/20 1051      PRN meds: sodium chloride, albuterol, docusate, Gerhardt's butt cream, labetalol, Resource ThickenUp Clear   Objective: Vitals:   03/13/20 0954 03/13/20 1148  BP: (!) 114/49 105/68  Pulse:  87 78  Resp: 18 18  Temp: 97.6 F (36.4 C) 98.5 F (36.9 C)  SpO2: 100% 100%    Intake/Output Summary (Last 24 hours) at 03/13/2020 1430 Last data filed at 03/13/2020 1006 Gross per 24 hour  Intake 240 ml  Output 375 ml  Net -135 ml   Filed Weights   03/09/20 0500 03/10/20 0337 03/13/20 0500  Weight: 61.2 kg 60.7 kg 61.2 kg   Weight change:  Body mass index is 19.36 kg/m.   Physical Exam: General exam: Appears calm and comfortable.  Not in physical distress Skin: No rashes, lesions or ulcers. HEENT: Atraumatic, normocephalic, supple neck, no obvious bleeding Lungs: Clear  to auscultate bilaterally CVS: Regular rate and rhythm, no murmur GI/Abd soft, nontender, nondistended, bowel sound present CNS: Alert, awake, confused, knows he is in the hospital Psychiatry: Mood appropriate Extremities: No pedal edema, no calf tenderness  Data Review: I have personally reviewed the laboratory data and studies available.  No results for input(s): WBC, NEUTROABS, HGB, HCT, MCV, PLT in the last 168 hours. Recent Labs  Lab 03/09/20 0137 03/10/20 0211 03/11/20 0447 03/12/20 0455 03/13/20 0123  NA 131* 132* 134* 134* 135  K 4.9 4.7 3.8 3.7 3.7  CL 100 99 102 100 101  CO2 20* 22 22 23 22   GLUCOSE 111* 79 87 96 115*  BUN 33* 41* 37* 38* 38*  CREATININE 1.34* 1.56* 1.33* 1.50* 1.48*  CALCIUM 9.1 9.3 9.1 9.3 9.1  MG 1.7 1.6* 2.2 1.8 1.4*  PHOS 3.6 3.4 3.7 3.6 3.4    F/u labs ordered  Signed, Terrilee Croak, MD [facility] 03/13/2020

## 2020-03-13 NOTE — TOC Progression Note (Addendum)
Transition of Care [facility]) - Progression Note    Patient Details  Name: Howard Allen MRN: [id number] Date of Birth: 08/17/1945  [facility]) CM/SW Contact  Joanne Chars, LCSW Phone Number: 03/13/2020, 1:37 PM  Clinical Narrative:   Message received from [place] at [facility].  They can admit pt on Monday, 11/8.  CSW spoke with pt daughter Melody and the would like to spend the night in [place], pick pt up from [facility] as early as possible on Monday, drive 9 hours back to [place] and they have been told pt can be admitted on that day, even if it is after hours.  CSW LM with Colletta Maryland at [employer] [employer] asking for details.  1500:  CSW received call from second facility: [facility].  [phone].  Tammy from admissions said they can accept pt. Tammy cell [phone].  This is a back up option if needed.    Expected Discharge Plan: [place] Barriers to Discharge: Continued Medical Work up  Expected Discharge Plan and Services Expected Discharge Plan: [place] Acute Care Choice: IP Rehab Living arrangements for the past 2 months: Single Family Home                                       Social Determinants of Health (SDOH) Interventions    Readmission Risk Interventions No flowsheet data found.

## 2020-03-14 ENCOUNTER — Other Ambulatory Visit (HOSPITAL_COMMUNITY): Payer: Self-pay | Admitting: Internal Medicine

## 2020-03-14 ENCOUNTER — Inpatient Hospital Stay (HOSPITAL_COMMUNITY): Payer: Medicare Other

## 2020-03-14 DIAGNOSIS — G030 Nonpyogenic meningitis: Secondary | ICD-10-CM | POA: Diagnosis not present

## 2020-03-14 LAB — GLUCOSE, CAPILLARY
Glucose-Capillary: 109 mg/dL — ABNORMAL HIGH (ref 70–99)
Glucose-Capillary: 115 mg/dL — ABNORMAL HIGH (ref 70–99)
Glucose-Capillary: 127 mg/dL — ABNORMAL HIGH (ref 70–99)
Glucose-Capillary: 130 mg/dL — ABNORMAL HIGH (ref 70–99)
Glucose-Capillary: 98 mg/dL (ref 70–99)

## 2020-03-14 LAB — PHOSPHORUS: Phosphorus: 3.2 mg/dL (ref 2.5–4.6)

## 2020-03-14 MED ORDER — METOPROLOL TARTRATE 25 MG PO TABS: 75.0000 mg | ORAL_TABLET | Freq: Two times a day (BID) | ORAL | 0 refills | Status: DC

## 2020-03-14 MED ORDER — MAGNESIUM SULFATE 50 % IJ SOLN
3.0000 g | Freq: Once | INTRAVENOUS | Status: AC
  Administered 2020-03-14: 3 g via INTRAVENOUS
  Filled 2020-03-14: qty 6

## 2020-03-14 MED ORDER — ATORVASTATIN CALCIUM 80 MG PO TABS: 80.0000 mg | ORAL_TABLET | Freq: Every day | ORAL | 0 refills | Status: DC

## 2020-03-14 MED ORDER — ALPRAZOLAM 0.25 MG PO TABS
0.2500 mg | ORAL_TABLET | Freq: Three times a day (TID) | ORAL | Status: DC | PRN
  Administered 2020-03-14: 0.25 mg via ORAL
  Filled 2020-03-14: qty 1

## 2020-03-14 MED ORDER — HYDRALAZINE HCL 20 MG/ML IJ SOLN: 10.0000 mg | Freq: Four times a day (QID) | INTRAMUSCULAR | Status: DC | PRN

## 2020-03-14 MED ORDER — BUSPIRONE HCL 15 MG PO TABS: 15.0000 mg | ORAL_TABLET | Freq: Two times a day (BID) | ORAL | 0 refills | Status: DC

## 2020-03-14 MED ORDER — METOPROLOL TARTRATE 25 MG PO TABS
25.0000 mg | ORAL_TABLET | Freq: Once | ORAL | Status: AC
  Administered 2020-03-14: 25 mg via ORAL
  Filled 2020-03-14: qty 1

## 2020-03-14 MED ORDER — ALPRAZOLAM 0.25 MG PO TABS: 0.2500 mg | ORAL_TABLET | Freq: Three times a day (TID) | ORAL | 0 refills | Status: DC | PRN

## 2020-03-14 MED ORDER — LEVALBUTEROL HCL 0.63 MG/3ML IN NEBU: 0.6300 mg | INHALATION_SOLUTION | Freq: Four times a day (QID) | RESPIRATORY_TRACT | Status: DC | PRN

## 2020-03-14 MED ORDER — DILTIAZEM HCL 30 MG PO TABS
30.0000 mg | ORAL_TABLET | Freq: Three times a day (TID) | ORAL | Status: DC
  Administered 2020-03-14: 30 mg via ORAL
  Filled 2020-03-14: qty 1

## 2020-03-14 MED ORDER — BUSPIRONE HCL 10 MG PO TABS
15.0000 mg | ORAL_TABLET | Freq: Two times a day (BID) | ORAL | Status: DC
  Administered 2020-03-14 – 2020-03-17 (×7): 15 mg via ORAL
  Filled 2020-03-14 (×7): qty 2

## 2020-03-14 MED ORDER — ASPIRIN 81 MG PO CHEW: 81.0000 mg | CHEWABLE_TABLET | Freq: Every day | ORAL | 0 refills | Status: DC

## 2020-03-14 MED ORDER — METOPROLOL TARTRATE 50 MG PO TABS
75.0000 mg | ORAL_TABLET | Freq: Two times a day (BID) | ORAL | Status: DC
  Administered 2020-03-15 – 2020-03-17 (×5): 75 mg via ORAL
  Filled 2020-03-14 (×5): qty 1

## 2020-03-14 MED FILL — busPIRone HCL 15 MG TABS: 15 | 30 days supply | Qty: 60 | Fill #0

## 2020-03-14 MED FILL — METOPROLOL TARTRATE 25 MG T: 25 | 10 days supply | Qty: 60 | Fill #0

## 2020-03-14 MED FILL — ASPIRIN LOW DOSE 81 MG CHEW: 81 | 30 days supply | Qty: 30 | Fill #0

## 2020-03-14 MED FILL — ALPRAZolam 0.25 MG TABS: 0.25 | 5 days supply | Qty: 15 | Fill #0

## 2020-03-14 MED FILL — ATORVASTATIN CALCIUM 80 MG: 80 | 30 days supply | Qty: 30 | Fill #0

## 2020-03-14 NOTE — Progress Notes (Signed)
HOSPITAL MEDICINE OVERNIGHT EVENT NOTE    Notified by nursing that patient had a 2-minute episode of SVT.  Episode spontaneously resolved.  Patient was essentially asymptomatic.  Chart reviewed, patient has recently had metoprolol increased yesterday due to bouts of SVT yesterday.  Will advise morning dose of metoprolol be given early.  Furthermore, patient found to have a magnesium level of 1.4 yesterday and does not seem to have had repletion ordered yet.  Will order 3 g of intravenous magnesium sulfate now.  Vernelle Emerald  MD [facility]

## 2020-03-14 NOTE — TOC Progression Note (Signed)
Transition of Care [facility]) - Progression Note    Patient Details  Name: Adison Jerger MRN: [id number] Date of Birth: [birth year]  Transition of Care [facility]) CM/SW Contact  Pollie Friar, RN Phone Number: 03/14/2020, 3:57 PM  Clinical Narrative:    So d/c to [facility] in [place] on Monday early. CM spoke to [place] in admissions and she will just need d/c summary on Sunday. MD aware.  Contact for Colletta Maryland over the weekend: [phone] Number to fax summary: [phone] RN to call report Monday am to: [phone] and ask for the [place]. TOC following.   Expected Discharge Plan: [place] Barriers to Discharge: Continued Medical Work up  Expected Discharge Plan and Services Expected Discharge Plan: [place] Acute Care Choice: IP Rehab Living arrangements for the past 2 months: Single Family Home                                       Social Determinants of Health (SDOH) Interventions    Readmission Risk Interventions No flowsheet data found.

## 2020-03-14 NOTE — Progress Notes (Signed)
Physical Therapy Treatment Patient Details Name: Howard Allen MRN: [id number] DOB: [birth year] Today's Date: 03/14/2020    History of Present Illness Pt is a 74 year old male who was having multiple seizures and has a history of falls. 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 NIH 4 on 02/15/20 and then 14 on 02/17/20. Medical hx consisting of Chron's disease and HTN.    PT Comments    Pt tolerates treatment well, continuing to demonstrate R foot drag and instability with all ambulation. Pt does demonstrate improved transfer technique during session, requiring less assistance to complete multiple transfers by the end of session, however the pt does demonstrate poor recall of these same transfer cues from the previous session. Pt will benefit from continued acute PT POC to improve mobility quality and to reduce falls risk. PT recommends SNF placement at this time.   Follow Up Recommendations  Supervision/Assistance - 24 hour;SNF     Equipment Recommendations  Rolling walker with 5" wheels;3in1 (PT);Wheelchair (measurements PT);Wheelchair cushion (measurements PT);Hospital bed    Recommendations for Other Services       Precautions / Restrictions Precautions Precautions: Fall Precaution Comments: h/o seizures Restrictions Weight Bearing Restrictions: No    Mobility  Bed Mobility Overal bed mobility: Needs Assistance Bed Mobility: Supine to Sit Rolling: Supervision Sidelying to sit: Min assist Supine to sit: Supervision;HOB elevated     General bed mobility comments: extra time and effort, utilized bed  rail  Transfers Overall transfer level: Needs assistance Equipment used: Rolling walker (2 wheeled) Transfers: Sit to/from Stand Sit to Stand: Min assist;Min guard         General transfer comment: minA progressing to minG with cues for anterior trunk lean, rocking for momentum, increased width of BOS, and hand placement. Pt performs 6 sit to stand reps during session  Ambulation/Gait Ambulation/Gait assistance: Min assist Gait Distance (Feet): 25 Feet Assistive device: Rolling walker (2 wheeled) Gait Pattern/deviations: Step-to pattern Gait velocity: reduced Gait velocity interpretation: <1.8 ft/sec, indicate of risk for recurrent falls General Gait Details: pt with short step to gait, R foot drag noted with multiple anterior losses of balance due to excess forward lean   Stairs Stairs:  (0)           Wheelchair Mobility    Modified Rankin (Stroke Patients Only) Modified Rankin (Stroke Patients Only) Pre-Morbid Rankin Score: No symptoms Modified Rankin: Moderately severe disability     Balance Overall balance assessment: Needs assistance Sitting-balance support: No upper extremity supported;Feet supported Sitting balance-Leahy Scale: Good Sitting balance - Comments: close supervision   Standing balance support: Single extremity supported;Bilateral upper extremity supported Standing balance-Leahy Scale: Poor Standing balance comment: reliant on UE support of RW                            Cognition Arousal/Alertness: Awake/alert Behavior During Therapy: WFL for tasks assessed/performed Overall Cognitive Status: Impaired/Different from baseline Area of Impairment: Memory;Following commands;Safety/judgement;Problem solving;Awareness                 Orientation Level: Disoriented to;Time;Situation Current Attention Level: Selective Memory: Decreased short-term  memory Following Commands: Follows one step commands consistently Safety/Judgement:  Decreased awareness of safety;Decreased awareness of deficits Awareness: Emergent Problem Solving: Slow processing;Difficulty sequencing        Exercises General Exercises - Lower Extremity Ankle Circles/Pumps: AROM;Both;10 reps Gluteal Sets: AROM;Both;5 reps Straight Leg Raises: AROM;Both;5 reps    General Comments General comments (skin integrity, edema, etc.): VSS on RA      Pertinent Vitals/Pain Pain Assessment: No/denies pain    Home Living                      Prior Function            PT Goals (current goals can now be found in the care plan section) Acute Rehab PT Goals Patient Stated Goal: to go home Progress towards PT goals: Progressing toward goals    Frequency    Min 3X/week      PT Plan Current plan remains appropriate    Co-evaluation              AM-PAC PT "6 Clicks" Mobility   Outcome Measure  Help needed turning from your back to your side while in a flat bed without using bedrails?: None Help needed moving from lying on your back to sitting on the side of a flat bed without using bedrails?: None Help needed moving to and from a bed to a chair (including a wheelchair)?: A Little Help needed standing up from a chair using your arms (e.g., wheelchair or bedside chair)?: A Little Help needed to walk in hospital room?: A Little Help needed climbing 3-5 steps with a railing? : Total 6 Click Score: 18    End of Session Equipment Utilized During Treatment: Gait belt Activity Tolerance: Patient tolerated treatment well Patient left: in chair;with call bell/phone within reach;with chair alarm set Nurse Communication: Mobility status PT Visit Diagnosis: Unsteadiness on feet (R26.81);Other abnormalities of gait and mobility (R26.89);Muscle weakness (generalized) (M62.81);History of falling (Z91.81);Difficulty in walking, not elsewhere classified (R26.2);Other symptoms and signs involving the nervous system (R29.898)     Time:  4627-0350 PT Time Calculation (min) (ACUTE ONLY): 29 min  Charges:  $Gait Training: 8-22 mins $Therapeutic Activity: 8-22 mins                     Zenaida Niece, PT, DPT Acute Rehabilitation Pager: [phone]    Zenaida Niece 03/14/2020, 4:51 PM

## 2020-03-14 NOTE — TOC Progression Note (Signed)
Transition of Care [facility]) - Progression Note    Patient Details  Name: Howard Allen MRN: [id number] Date of Birth: Nov 02, 1945  Transition of Care [facility]) CM/SW [place], [place] Phone Number: 03/14/2020, 3:36 PM  Clinical Narrative:    CSW spoke with pt's dr. Mila [facility] pharmacy. Pt will be leaving early Monday morning and will need medications. Pharmacy will fill this afternoon and leave in office for pt's nurse to get Monday morning. SW will continue to follow.   Expected Discharge Plan: [place] Barriers to Discharge: Continued Medical Work up  Expected Discharge Plan and Services Expected Discharge Plan: [place] Acute Care Choice: IP Rehab Living arrangements for the past 2 months: Single Family Home                                       Social Determinants of Health (SDOH) Interventions    Readmission Risk Interventions No flowsheet data found.

## 2020-03-14 NOTE — Progress Notes (Signed)
PROGRESS NOTE  Howard Allen  DOB: Apr 28, 1946  PCP: System, Provider Not In [id number]  DOA: 02/13/2020  LOS: 30 days   Chief Complaint  Patient presents with  . Seizures    Brief narrative: 74 year old Caucasian male with PMH significant for for Crohn's disease, hypertension, hyperlipidemia, depression, history of hospitalization in June 2021 for pneumatosis status post ileocolonic resection and lysis of adhesions. Patient was brought to the ED on 10/6 with multiple tonic-clonic seizures including 3 in the ED.  Complicated by status epilepticus that required intubation and loading with Keppra.  CT of the head was negative.  Patient was admitted to ICU.  Extubated on 10/8.  MRI brain showed a signal abnormality in the left limb most consistent with an acute infarct.  Neurology and infectious disease consulted.  EEG testing was negative.  10/7, patient underwent lumbar puncture. CSF analysis was consistent with aseptic meningitis with high white blood cells and very low glucose level.  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  Because of persistent impaired mental status, large volume LP was recommended. 10/15, patient underwent large-volume LP. Negative infectious disease work-up so far.  Candida was positive in the CSF sample but per ID it is most likely not the cause of infection.  In any case, patient is currently on fluconazole 800 mg daily to cover for possible fungal infection. Mental status improving but still not back to baseline.  PT OT, ST evaluation were obtained.  SNF transfer recommended.  Patient is from [place] and is currently waiting for bed availability over there.  Subjective: Patient was seen and examined this morning.   Patient does not complain of any symptoms but he is having multiple episodes of short lasting PSVT's.  Yesterday morning it improved after metoprolol dose was increased.  But it occurred again last night and this  morning.  Assessment/Plan: Aseptic meningitis -Multiple lumbar punctures performed.  Concerns for possible aseptic meningitis.  Initially treated with RIPE which was discontinued.  Plan was to continue oral fluconazole until 03/11/2020. -Most of the work-up was negative except some labs which are send out including  paraneoplastic panel -Seen by neurology and infectious disease -Completed 7-day tapering course of prednisone on 11/4   Acute ischemic infarct, left thalamic infarct -MRI brain from 10/7 showed acute infarct of the left thalamus. CTA head and neck did not show any acute large vessel occlusion.   -Currently on aspirin 81 mg daily, Plavix 75 mg daily and Lipitor 80 mg daily.   Frequent paroxysmal SVTs -Since 11/4, patient is having frequent episodes of asymptomatic PSVT's. -This morning I added Cardizem 30 mg 1 dose to Toprol 100 mg daily. -We will give an extra dose of Lopressor 25 mg tonight and switch to Lopressor 75 mg twice daily from tomorrow morning. -Continue to monitor in telemetry.  Essential hypertension -Controlled on metoprolol.  Continue to monitor.  5 mm pulmonary nodule, right upper lobe -Follow-up outpatient with PCP  Right eye hematoma -Previously seen by ENT, no need for further intervention at this time.  Medical management. -Patient has sutures in place, likely placed in ED. removed.  Tonic-Clonic Seizures, Status Epilepticus -Keppra 1000 mg twice daily  Moderate to severe protein caloric malnutrition -Encourage p.o. intake  Normocytic Anemia Hx of Iron Deficiency Anemia B12 Deficiency  -Continue B12 supplements -Hemoglobin stable.  Questionable Crohn's Disease s/p Ileocolic Resection -No longer on immunosuppressive therapy. -Takes budesonide as an outpatient - -currently on hold since hospitalization for last more  than 3 weeks.  He has not had any flareup of Crohn's disease so far.  I would keep it on hold for now and recommend  follow-up as an outpatient.  AKI on CKD stage II, stable -Stable Recent Labs    03/01/20 0211 03/02/20 0252 03/03/20 0443 03/06/20 0546 03/07/20 0231 03/09/20 0137 03/10/20 0211 03/11/20 0447 03/12/20 0455 03/13/20 0123  BUN 34* 28* 28* 38* 32* 33* 41* 37* 38* 38*  CREATININE 1.40* 1.44* 1.46* 1.75* 1.36* 1.34* 1.56* 1.33* 1.50* 1.48*   Mobility: PT eval obtained. Code Status:   Code Status: Full Code  Nutritional status: Body mass index is 18.69 kg/m. Nutrition Problem: Moderate Malnutrition Etiology: chronic illness Signs/Symptoms: mild fat depletion, moderate fat depletion, mild muscle depletion, moderate muscle depletion Diet Order            DIET DYS 3 Room service appropriate? Yes with Assist; Fluid consistency: Thin  Diet effective now                 DVT prophylaxis: heparin injection 5,000 Units Start: 02/13/20 2200 SCDs Start: 02/13/20 1737   Antimicrobials:  Completed the course Fluid: None Consultants: Neurology, ID Family Communication:  Not at bedside  Status is: Inpatient  Remains inpatient appropriate because:Unsafe d/c plan   Dispo: The patient is from: Home              Anticipated d/c is to: SNF at [place]              Anticipated d/c date is: On Monday 11/8              Patient currently is medically stable to d/c.   Infusions:  . sodium chloride Stopped (02/24/20 0116)  . sodium chloride 1,000 mL (03/14/20 0810)    Scheduled Meds: . aspirin  81 mg Oral Daily  . atorvastatin  80 mg Oral Daily  . busPIRone  15 mg Oral BID  . Chlorhexidine Gluconate Cloth  6 each Topical Daily  . clopidogrel  75 mg Oral Daily  . diltiazem  30 mg Oral Q8H  . docusate  100 mg Oral BID  . feeding supplement  237 mL Oral BID BM  . fenofibrate  160 mg Oral Daily  . heparin  5,000 Units Subcutaneous Q8H  . levETIRAcetam  1,000 mg Oral Q12H  . metoprolol succinate  100 mg Oral Daily  . multivitamin with minerals  1 tablet Oral Daily  . nicotine   14 mg Transdermal Daily  . pantoprazole sodium  40 mg Oral Daily  . polyethylene glycol  17 g Oral Daily  . vitamin B-12  1,000 mcg Oral Daily    Antimicrobials: Anti-infectives (From admission, onward)   Start     Dose/Rate Route Frequency Ordered Stop   03/04/20 1000  fluconazole (DIFLUCAN) tablet 800 mg  Status:  Discontinued        800 mg Oral Daily 03/03/20 1159 03/12/20 1323   02/26/20 1115  rifampin (RIFADIN) 600 mg in sodium chloride 0.9 % 100 mL IVPB  Status:  Discontinued        600 mg 200 mL/hr over 30 Minutes Intravenous Every 24 hours 02/26/20 1026 03/03/20 1141   02/23/20 0100  acyclovir (ZOVIRAX) 615 mg in dextrose 5 % 100 mL IVPB  Status:  Discontinued        10 mg/kg  61.3 kg 112.3 mL/hr over 60 Minutes Intravenous Every 12 hours 02/22/20 1537 02/24/20 0936   02/22/20 2100  amphotericin B liposome (  AMBISOME) 310 mg in dextrose 5 % 500 mL IVPB  Status:  Discontinued        5 mg/kg  61.3 kg 288.8 mL/hr over 120 Minutes Intravenous Every 24 hours 02/22/20 1524 03/03/20 1159   02/22/20 1800  rifampin (RIFADIN) capsule 600 mg  Status:  Discontinued        600 mg Oral Daily 02/22/20 1520 02/26/20 1026   02/22/20 1800  isoniazid (NYDRAZID) tablet 300 mg  Status:  Discontinued        300 mg Oral Daily 02/22/20 1520 03/03/20 1141   02/22/20 1800  pyrazinamide tablet 1,500 mg  Status:  Discontinued        1,500 mg Oral Daily 02/22/20 1520 03/03/20 1141   02/22/20 1800  ethambutol (MYAMBUTOL) tablet 1,200 mg  Status:  Discontinued        1,200 mg Oral Daily 02/22/20 1520 03/03/20 1141   02/20/20 1000  vancomycin (VANCOCIN) IVPB 1000 mg/200 mL premix  Status:  Discontinued        1,000 mg 200 mL/hr over 60 Minutes Intravenous Every 24 hours 02/20/20 0555 02/21/20 1259   02/19/20 1811  vancomycin variable dose per unstable renal function (pharmacist dosing)  Status:  Discontinued         Does not apply See admin instructions 02/19/20 1811 02/20/20 0555   02/18/20 0500   vancomycin (VANCOREADY) IVPB 750 mg/150 mL  Status:  Discontinued        750 mg 150 mL/hr over 60 Minutes Intravenous Every 12 hours 02/17/20 1651 02/19/20 1811   02/15/20 0300  vancomycin (VANCOREADY) IVPB 500 mg/100 mL        500 mg 100 mL/hr over 60 Minutes Intravenous Every 12 hours 02/14/20 1352 02/17/20 1755   02/14/20 1400  acyclovir (ZOVIRAX) 690 mg in dextrose 5 % 100 mL IVPB  Status:  Discontinued        10 mg/kg  68.9 kg 113.8 mL/hr over 60 Minutes Intravenous Every 12 hours 02/14/20 1245 02/22/20 1537   02/14/20 1400  vancomycin (VANCOREADY) IVPB 1250 mg/250 mL        1,250 mg 166.7 mL/hr over 90 Minutes Intravenous STAT 02/14/20 1352 02/14/20 1655   02/14/20 1300  cefTRIAXone (ROCEPHIN) 2 g in sodium chloride 0.9 % 100 mL IVPB  Status:  Discontinued        2 g 200 mL/hr over 30 Minutes Intravenous Every 12 hours 02/14/20 1245 02/20/20 1732   02/14/20 1300  ampicillin (OMNIPEN) 2 g in sodium chloride 0.9 % 100 mL IVPB  Status:  Discontinued        2 g 300 mL/hr over 20 Minutes Intravenous Every 6 hours 02/14/20 1245 02/18/20 1051      PRN meds: sodium chloride, ALPRAZolam, docusate, Gerhardt's butt cream, hydrALAZINE, levalbuterol, Resource ThickenUp Clear   Objective: Vitals:   03/14/20 0628 03/14/20 0807  BP: 111/72 120/68  Pulse: 81 85  Resp:  18  Temp:  97.9 F (36.6 C)  SpO2: 100% 100%    Intake/Output Summary (Last 24 hours) at 03/14/2020 1437 Last data filed at 03/14/2020 1346 Gross per 24 hour  Intake 360 ml  Output 300 ml  Net 60 ml   Filed Weights   03/10/20 0337 03/13/20 0500 03/14/20 0358  Weight: 60.7 kg 61.2 kg 59.1 kg   Weight change: -2.1 kg Body mass index is 18.69 kg/m.   Physical Exam: General exam: Appears calm and comfortable.  Not in physical distress Skin: No rashes, lesions or ulcers. HEENT:  Atraumatic, normocephalic, supple neck, no obvious bleeding Lungs: Clear to auscultate bilaterally CVS: Regular rhythm, tachycardic, no  murmur  GI/Abd soft, nontender, nondistended, bowel sound present CNS: Alert, awake, confused, knows he is in the hospital Psychiatry: Mood appropriate Extremities: No pedal edema, no calf tenderness  Data Review: I have personally reviewed the laboratory data and studies available.  No results for input(s): WBC, NEUTROABS, HGB, HCT, MCV, PLT in the last 168 hours. Recent Labs  Lab 03/09/20 0137 03/09/20 0137 03/10/20 0211 03/11/20 0447 03/12/20 0455 03/13/20 0123 03/14/20 0450  NA 131*  --  132* 134* 134* 135  --   K 4.9  --  4.7 3.8 3.7 3.7  --   CL 100  --  99 102 100 101  --   CO2 20*  --  22 22 23 22   --   GLUCOSE 111*  --  79 87 96 115*  --   BUN 33*  --  41* 37* 38* 38*  --   CREATININE 1.34*  --  1.56* 1.33* 1.50* 1.48*  --   CALCIUM 9.1  --  9.3 9.1 9.3 9.1  --   MG 1.7  --  1.6* 2.2 1.8 1.4*  --   PHOS 3.6   < > 3.4 3.7 3.6 3.4 3.2   < > = values in this interval not displayed.    F/u labs ordered  Signed, Terrilee Croak, MD [facility] 03/14/2020

## 2020-03-14 NOTE — Plan of Care (Signed)
Patient slept throughout most of the night. Attempted to get OOB 3 times. AOx2-3. Swallow pills whole. Repositioned for comfort. Condom cath in place. Bed alarm on. Call bell within reach.   Problem: Education: Goal: Knowledge of disease or condition will improve Outcome: Progressing Goal: Knowledge of secondary prevention will improve Outcome: Progressing Goal: Knowledge of patient specific risk factors addressed and post discharge goals established will improve Outcome: Progressing Goal: Individualized Educational Video(s) Outcome: Progressing   Problem: Coping: Goal: Will verbalize positive feelings about self Outcome: Progressing Goal: Will identify appropriate support needs Outcome: Progressing   Problem: Ischemic Stroke/TIA Tissue Perfusion: Goal: Complications of ischemic stroke/TIA will be minimized Outcome: Progressing   Problem: Education: Goal: Knowledge of General Education information will improve Description: Including pain rating scale, medication(s)/side effects and non-pharmacologic comfort measures Outcome: Progressing   Problem: Health Behavior/Discharge Planning: Goal: Ability to manage health-related needs will improve Outcome: Progressing   Problem: Clinical Measurements: Goal: Ability to maintain clinical measurements within normal limits will improve Outcome: Progressing Goal: Will remain free from infection Outcome: Progressing Goal: Diagnostic test results will improve Outcome: Progressing Goal: Respiratory complications will improve Outcome: Progressing Goal: Cardiovascular complication will be avoided Outcome: Progressing   Problem: Activity: Goal: Risk for activity intolerance will decrease Outcome: Progressing   Problem: Nutrition: Goal: Adequate nutrition will be maintained Outcome: Progressing   Problem: Coping: Goal: Level of anxiety will decrease Outcome: Progressing   Problem: Elimination: Goal: Will not experience  complications related to bowel motility Outcome: Progressing Goal: Will not experience complications related to urinary retention Outcome: Progressing   Problem: Pain Managment: Goal: General experience of comfort will improve Outcome: Progressing   Problem: Safety: Goal: Ability to remain free from injury will improve Outcome: Progressing   Problem: Skin Integrity: Goal: Risk for impaired skin integrity will decrease Outcome: Progressing

## 2020-03-14 NOTE — Progress Notes (Addendum)
0621: patient had a run of SVT lasting 2 mins. RN to bedside, patient sleeping. Denies symptoms other then heart feels like it is racing.   Vitals taken. Provider paged. No response yet.   Strip saved in chart by TELE.   [phone]: spoke with provider, RN to give metoprolol dose now and give Magnesium replacement since it looks like Mag was not replaced yesterday.

## 2020-03-14 NOTE — Progress Notes (Signed)
Modified Barium Swallow Progress Note  Patient Details  Name: Howard Allen MRN: [id number] Date of Birth: 29-Dec-1945  Today's Date: 03/14/2020  Modified Barium Swallow completed.  Full report located under Chart Review in the Imaging Section.  Brief recommendations include the following:  Clinical Impression  Pt demonstrates minimal improvement since last MBS. Lingual pumping continues to contribute to mildly reduced efficiency, but is still functional. Thin liquids still enter the airway before and during the swallow due to decreased pharyngeal squeeze and reduced coordination for laryngeal vestibule closure.  Vallecular residue is present for all trialed consistencies, reduced by spontaneous sub-swallows. He continues to demonstrate aspiration of thin liquids when head is in neutral position.  Aspiration is sometimes accompanied by a cough; at other times, it is silent in nature.  The chin tuck improves airway protection consistently. Functionally, pt continues to require cues to follow-through with the chin tuck at meals.  Recommend continuing current diet of dysphagia 3, thin liquids, crush meds, CHIN TUCK with liquids.     Swallow Evaluation Recommendations       SLP Diet Recommendations: Dysphagia 3 (Mech soft) solids;Thin liquid   Liquid Administration via: Cup;Straw   Medication Administration: Crushed with puree   Supervision: Patient able to self feed;Intermittent supervision to cue for compensatory strategies   Compensations: Minimize environmental distractions;Chin tuck       Oral Care Recommendations: Oral care BID      Linton Stolp L. Tivis Ringer, [place] Office number [phone] Pager [phone]   Juan Quam Laurice 03/14/2020,12:41 PM

## 2020-03-14 NOTE — Progress Notes (Signed)
Occupational Therapy Treatment Patient Details Name: Howard Allen MRN: [id number] DOB: February 04, 1946 Today's Date: 03/14/2020    History of present illness Pt is a 74 year old male who was having multiple seizures and has a history of falls. 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 NIH 4 on 02/15/20 and then 14 on 02/17/20. Medical hx consisting of Chron's disease and HTN.   OT comments  Pt progressing towards acute OT goals. Able to walk in room distance (door of room and back) with close min guard assist. Functional implication is he could walk with assist to access bathroom. Min A for standing. D/c plan remains appropriate.   Follow Up Recommendations  SNF    Equipment Recommendations   (defer to next venue)    Recommendations for Other Services      Precautions / Restrictions Precautions Precautions: Fall Precaution Comments: h/o seizures Restrictions Weight Bearing Restrictions: No       Mobility Bed Mobility Overal bed mobility: Needs Assistance Bed Mobility: Rolling;Sidelying to Sit Rolling: Supervision Sidelying to sit: Min assist       General bed mobility comments: extra time and effort, utilized bed rail  Transfers Overall transfer level: Needs assistance Equipment used: Rolling walker (2 wheeled) Transfers: Sit to/from Stand Sit to Stand: Min assist         General transfer comment: to/from EOB min A to steady    Balance Overall balance assessment: Needs assistance Sitting-balance support: Feet supported;No upper extremity supported Sitting balance-Leahy Scale: Good     Standing  balance support: Bilateral upper extremity supported;During functional activity Standing balance-Leahy Scale: Poor Standing balance comment: reliant on UE support of RW                           ADL either performed or assessed with clinical judgement   ADL Overall ADL's : Needs assistance/impaired                         Toilet Transfer: Minimal assistance;Ambulation;RW;BSC Toilet Transfer Details (indicate cue type and reason): able to walk bathroom distance and complete transfer with up to min A                 Vision       Perception     Praxis      Cognition Arousal/Alertness: Awake/alert Behavior During Therapy: Flat affect Overall Cognitive Status: Impaired/Different from baseline Area of Impairment: Safety/judgement;Awareness;Problem solving                 Orientation Level: Disoriented to;Time;Situation Current Attention Level: Selective Memory: Decreased recall of precautions;Decreased short-term memory Following Commands: Follows one step commands with increased time;Follows one step commands consistently Safety/Judgement: Decreased awareness of safety Awareness: Emergent Problem Solving: Slow processing;Requires verbal cues;Requires tactile cues          Exercises     Shoulder Instructions       General Comments      Pertinent Vitals/ Pain       Pain Assessment: No/denies pain  Home Living  Prior Functioning/Environment              Frequency  Min 2X/week        Progress Toward Goals  OT Goals(current goals can now be found in the care plan section)  Progress towards OT goals: Progressing toward goals  Acute Rehab OT Goals Patient Stated Goal: to go home OT Goal Formulation: With patient/family Time For Goal Achievement: 03/20/20 Potential to Achieve Goals: Fair ADL Goals Pt Will Perform Grooming: with set-up;sitting Pt Will Perform  Upper Body Dressing: with set-up;sitting;with min guard assist Pt Will Perform Lower Body Dressing: with min assist;sit to/from stand Pt Will Transfer to Toilet: with min assist;ambulating Pt Will Perform Toileting - Clothing Manipulation and hygiene: with min assist;sit to/from stand  Plan Discharge plan remains appropriate    Co-evaluation                 AM-PAC OT "6 Clicks" Daily Activity     Outcome Measure   Help from another person eating meals?: A Little Help from another person taking care of personal grooming?: A Lot Help from another person toileting, which includes using toliet, bedpan, or urinal?: A Lot Help from another person bathing (including washing, rinsing, drying)?: A Lot Help from another person to put on and taking off regular upper body clothing?: A Lot Help from another person to put on and taking off regular lower body clothing?: A Lot 6 Click Score: 13    End of Session Equipment Utilized During Treatment: Gait belt;Rolling walker  OT Visit Diagnosis: Unsteadiness on feet (R26.81);History of falling (Z91.81);Muscle weakness (generalized) (M62.81);Other symptoms and signs involving the nervous system (R29.898);Other symptoms and signs involving cognitive function;Pain   Activity Tolerance Patient tolerated treatment well   Patient Left in bed;with call bell/phone within reach;with bed alarm set   Nurse Communication          Time: 5284-1324 OT Time Calculation (min): 17 min  Charges: OT General Charges $OT Visit: 1 Visit OT Treatments $Self Care/Home Management : 8-22 mins  [name], OT Acute Rehabilitation Services Pager: [phone] Office: [phone]    [name] 03/14/2020, 2:53 PM

## 2020-03-15 DIAGNOSIS — G030 Nonpyogenic meningitis: Secondary | ICD-10-CM | POA: Diagnosis not present

## 2020-03-15 LAB — CBC WITH DIFFERENTIAL/PLATELET
Abs Immature Granulocytes: 0.05 10*3/uL (ref 0.00–0.07)
Basophils Absolute: 0.1 10*3/uL (ref 0.0–0.1)
Basophils Relative: 1 %
Eosinophils Absolute: 0.1 10*3/uL (ref 0.0–0.5)
Eosinophils Relative: 1 %
HCT: 28.3 % — ABNORMAL LOW (ref 39.0–52.0)
Hemoglobin: 9.1 g/dL — ABNORMAL LOW (ref 13.0–17.0)
Immature Granulocytes: 1 %
Lymphocytes Relative: 11 %
Lymphs Abs: 0.8 10*3/uL (ref 0.7–4.0)
MCH: 30.2 pg (ref 26.0–34.0)
MCHC: 32.2 g/dL (ref 30.0–36.0)
MCV: 94 fL (ref 80.0–100.0)
Monocytes Absolute: 0.8 10*3/uL (ref 0.1–1.0)
Monocytes Relative: 11 %
Neutro Abs: 5.5 10*3/uL (ref 1.7–7.7)
Neutrophils Relative %: 75 %
Platelets: 200 10*3/uL (ref 150–400)
RBC: 3.01 MIL/uL — ABNORMAL LOW (ref 4.22–5.81)
RDW: 19.9 % — ABNORMAL HIGH (ref 11.5–15.5)
WBC: 7.3 10*3/uL (ref 4.0–10.5)
nRBC: 0 % (ref 0.0–0.2)

## 2020-03-15 LAB — BASIC METABOLIC PANEL
Anion gap: 10 (ref 5–15)
BUN: 38 mg/dL — ABNORMAL HIGH (ref 8–23)
CO2: 24 mmol/L (ref 22–32)
Calcium: 8.9 mg/dL (ref 8.9–10.3)
Chloride: 103 mmol/L (ref 98–111)
Creatinine, Ser: 1.4 mg/dL — ABNORMAL HIGH (ref 0.61–1.24)
GFR, Estimated: 53 mL/min — ABNORMAL LOW (ref >60)
Glucose, Bld: 110 mg/dL — ABNORMAL HIGH (ref 70–99)
Potassium: 3.4 mmol/L — ABNORMAL LOW (ref 3.5–5.1)
Sodium: 137 mmol/L (ref 135–145)

## 2020-03-15 LAB — GLUCOSE, CAPILLARY
Glucose-Capillary: 102 mg/dL — ABNORMAL HIGH (ref 70–99)
Glucose-Capillary: 102 mg/dL — ABNORMAL HIGH (ref 70–99)
Glucose-Capillary: 110 mg/dL — ABNORMAL HIGH (ref 70–99)
Glucose-Capillary: 116 mg/dL — ABNORMAL HIGH (ref 70–99)
Glucose-Capillary: 119 mg/dL — ABNORMAL HIGH (ref 70–99)
Glucose-Capillary: 92 mg/dL (ref 70–99)

## 2020-03-15 MED ORDER — POTASSIUM CHLORIDE CRYS ER 20 MEQ PO TBCR
40.0000 meq | EXTENDED_RELEASE_TABLET | Freq: Once | ORAL | Status: AC
  Administered 2020-03-15: 40 meq via ORAL
  Filled 2020-03-15: qty 2

## 2020-03-15 NOTE — Progress Notes (Signed)
PROGRESS NOTE  Howard Allen  DOB: [birth year]  PCP: System, Provider Not In XNA:[id number]  DOA: 02/13/2020  LOS: 31 days   Chief Complaint  Patient presents with  . Seizures    Brief narrative: 74 year old Caucasian male with PMH significant for for Crohn's disease, hypertension, hyperlipidemia, depression, history of hospitalization in June 2021 for pneumatosis status post ileocolonic resection and lysis of adhesions. Patient was brought to the ED on 10/6 with multiple tonic-clonic seizures including 3 in the ED.  Complicated by status epilepticus that required intubation and loading with Keppra.  CT of the head was negative.  Patient was admitted to ICU.  Extubated on 10/8.  MRI brain showed a signal abnormality in the left limb most consistent with an acute infarct.  Neurology and infectious disease consulted.  EEG testing was negative.  10/7, patient underwent lumbar puncture. CSF analysis was consistent with aseptic meningitis with high white blood cells and very low glucose level.  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  Because of persistent impaired mental status, large volume LP was recommended. 10/15, patient underwent large-volume LP. Negative infectious disease work-up so far.  Candida was positive in the CSF sample but per ID it is most likely not the cause of infection.  In any case, patient is currently on fluconazole 800 mg daily to cover for possible fungal infection. Mental status improving but still not back to baseline.  PT OT, ST evaluation were obtained.  SNF transfer recommended.  Patient is from [place] and is currently waiting for bed availability over there.  Subjective: Patient was seen and examined this morning.   Taking his breakfast.  Not in distress.  Telemetry reviewed.   2 episodes of PSVT this morning.  Otherwise in last 24 hours, patient did not have any episodes after medications were adjusted yesterday  morning.  Assessment/Plan: Aseptic meningitis -Multiple lumbar punctures performed.  Concerns for possible aseptic meningitis.  Initially treated with RIPE which was discontinued.  Plan was to continue oral fluconazole until 03/11/2020. -Most of the work-up was negative except some labs which are send out including  paraneoplastic panel -Seen by neurology and infectious disease -Completed 7-day tapering course of prednisone on 11/4   Acute ischemic infarct, left thalamic infarct -MRI brain from 10/7 showed acute infarct of the left thalamus. CTA head and neck did not show any acute large vessel occlusion.   -Currently on aspirin 81 mg daily, Plavix 75 mg daily and Lipitor 80 mg daily.   Frequent paroxysmal SVTs - 2 episodes of PSVT this morning.  Otherwise in last 24 hours, patient did not have any episodes after medications were adjusted yesterday morning. -Prior to admission, patient was on Toprol 100 mg daily.  Currently on Lopressor 75 mg twice daily. -Continue to monitor in telemetry.  Essential hypertension -Controlled on metoprolol.  Continue to monitor.  5 mm pulmonary nodule, right upper lobe -Follow-up outpatient with PCP  Right eye hematoma -Previously seen by ENT, no need for further intervention at this time.  Medical management. -Patient has sutures in place, likely placed in ED. removed.  Tonic-Clonic Seizures, Status Epilepticus -Keppra 1000 mg twice daily  Moderate to severe protein caloric malnutrition -Encourage p.o. intake  Normocytic Anemia Hx of Iron Deficiency Anemia B12 Deficiency  -Continue B12 supplements -Hemoglobin stable.  Questionable Crohn's Disease s/p Ileocolic Resection -No longer on immunosuppressive therapy. -Takes budesonide as an outpatient - -currently on hold since hospitalization for last more than 3 weeks.  He has not had any flareup of Crohn's disease so far.  I would keep it on hold for now and recommend follow-up as an  outpatient.  AKI on CKD stage II, stable -Stable Recent Labs    03/02/20 0252 03/03/20 0443 03/06/20 0546 03/07/20 0231 03/09/20 0137 03/10/20 0211 03/11/20 0447 03/12/20 0455 03/13/20 0123 03/15/20 0137  BUN 28* 28* 38* 32* 33* 41* 37* 38* 38* 38*  CREATININE 1.44* 1.46* 1.75* 1.36* 1.34* 1.56* 1.33* 1.50* 1.48* 1.40*   Mobility: PT eval obtained. Code Status:   Code Status: Full Code  Nutritional status: Body mass index is 18.69 kg/m. Nutrition Problem: Moderate Malnutrition Etiology: chronic illness Signs/Symptoms: mild fat depletion, moderate fat depletion, mild muscle depletion, moderate muscle depletion Diet Order            DIET DYS 3 Room service appropriate? Yes with Assist; Fluid consistency: Thin  Diet effective now                 DVT prophylaxis: heparin injection 5,000 Units Start: 02/13/20 2200 SCDs Start: 02/13/20 1737   Antimicrobials:  Completed the course Fluid: None Consultants: Neurology, ID Family Communication:  Not at bedside  Status is: Inpatient  Remains inpatient appropriate because:Unsafe d/c plan   Dispo: The patient is from: Home              Anticipated d/c is to: SNF at [place]              Anticipated d/c date is: On Monday 11/8              Patient currently is medically stable to d/c.   Infusions:  . sodium chloride Stopped (02/24/20 0116)  . sodium chloride 1,000 mL (03/14/20 0810)    Scheduled Meds: . aspirin  81 mg Oral Daily  . atorvastatin  80 mg Oral Daily  . busPIRone  15 mg Oral BID  . Chlorhexidine Gluconate Cloth  6 each Topical Daily  . clopidogrel  75 mg Oral Daily  . docusate  100 mg Oral BID  . feeding supplement  237 mL Oral BID BM  . fenofibrate  160 mg Oral Daily  . heparin  5,000 Units Subcutaneous Q8H  . levETIRAcetam  1,000 mg Oral Q12H  . metoprolol tartrate  75 mg Oral BID  . multivitamin with minerals  1 tablet Oral Daily  . nicotine  14 mg Transdermal Daily  . pantoprazole sodium  40  mg Oral Daily  . polyethylene glycol  17 g Oral Daily  . vitamin B-12  1,000 mcg Oral Daily    Antimicrobials: Anti-infectives (From admission, onward)   Start     Dose/Rate Route Frequency Ordered Stop   03/04/20 1000  fluconazole (DIFLUCAN) tablet 800 mg  Status:  Discontinued        800 mg Oral Daily 03/03/20 1159 03/12/20 1323   02/26/20 1115  rifampin (RIFADIN) 600 mg in sodium chloride 0.9 % 100 mL IVPB  Status:  Discontinued        600 mg 200 mL/hr over 30 Minutes Intravenous Every 24 hours 02/26/20 1026 03/03/20 1141   02/23/20 0100  acyclovir (ZOVIRAX) 615 mg in dextrose 5 % 100 mL IVPB  Status:  Discontinued        10 mg/kg  61.3 kg 112.3 mL/hr over 60 Minutes Intravenous Every 12 hours 02/22/20 1537 02/24/20 0936   02/22/20 2100  amphotericin B liposome (AMBISOME) 310 mg in dextrose 5 % 500 mL IVPB  Status:  Discontinued        5 mg/kg  61.3 kg 288.8 mL/hr over 120 Minutes Intravenous Every 24 hours 02/22/20 1524 03/03/20 1159   02/22/20 1800  rifampin (RIFADIN) capsule 600 mg  Status:  Discontinued        600 mg Oral Daily 02/22/20 1520 02/26/20 1026   02/22/20 1800  isoniazid (NYDRAZID) tablet 300 mg  Status:  Discontinued        300 mg Oral Daily 02/22/20 1520 03/03/20 1141   02/22/20 1800  pyrazinamide tablet 1,500 mg  Status:  Discontinued        1,500 mg Oral Daily 02/22/20 1520 03/03/20 1141   02/22/20 1800  ethambutol (MYAMBUTOL) tablet 1,200 mg  Status:  Discontinued        1,200 mg Oral Daily 02/22/20 1520 03/03/20 1141   02/20/20 1000  vancomycin (VANCOCIN) IVPB 1000 mg/200 mL premix  Status:  Discontinued        1,000 mg 200 mL/hr over 60 Minutes Intravenous Every 24 hours 02/20/20 0555 02/21/20 1259   02/19/20 1811  vancomycin variable dose per unstable renal function (pharmacist dosing)  Status:  Discontinued         Does not apply See admin instructions 02/19/20 1811 02/20/20 0555   02/18/20 0500  vancomycin (VANCOREADY) IVPB 750 mg/150 mL  Status:   Discontinued        750 mg 150 mL/hr over 60 Minutes Intravenous Every 12 hours 02/17/20 1651 02/19/20 1811   02/15/20 0300  vancomycin (VANCOREADY) IVPB 500 mg/100 mL        500 mg 100 mL/hr over 60 Minutes Intravenous Every 12 hours 02/14/20 1352 02/17/20 1755   02/14/20 1400  acyclovir (ZOVIRAX) 690 mg in dextrose 5 % 100 mL IVPB  Status:  Discontinued        10 mg/kg  68.9 kg 113.8 mL/hr over 60 Minutes Intravenous Every 12 hours 02/14/20 1245 02/22/20 1537   02/14/20 1400  vancomycin (VANCOREADY) IVPB 1250 mg/250 mL        1,250 mg 166.7 mL/hr over 90 Minutes Intravenous STAT 02/14/20 1352 02/14/20 1655   02/14/20 1300  cefTRIAXone (ROCEPHIN) 2 g in sodium chloride 0.9 % 100 mL IVPB  Status:  Discontinued        2 g 200 mL/hr over 30 Minutes Intravenous Every 12 hours 02/14/20 1245 02/20/20 1732   02/14/20 1300  ampicillin (OMNIPEN) 2 g in sodium chloride 0.9 % 100 mL IVPB  Status:  Discontinued        2 g 300 mL/hr over 20 Minutes Intravenous Every 6 hours 02/14/20 1245 02/18/20 1051      PRN meds: sodium chloride, ALPRAZolam, docusate, Gerhardt's butt cream, hydrALAZINE, levalbuterol, Resource ThickenUp Clear   Objective: Vitals:   03/15/20 0358 03/15/20 0815  BP: 118/64 121/65  Pulse: 68 67  Resp: 19 18  Temp: 98.6 F (37 C) 98.3 F (36.8 C)  SpO2: 98% 100%    Intake/Output Summary (Last 24 hours) at 03/15/2020 1040 Last data filed at 03/14/2020 1346 Gross per 24 hour  Intake 120 ml  Output --  Net 120 ml   Filed Weights   03/10/20 0337 03/13/20 0500 03/14/20 0358  Weight: 60.7 kg 61.2 kg 59.1 kg   Weight change:  Body mass index is 18.69 kg/m.   Physical Exam: General exam: Appears calm and comfortable.  Not in physical distress Skin: No rashes, lesions or ulcers. HEENT: Atraumatic, normocephalic, supple neck, no obvious bleeding Lungs: Clear to auscultation bilaterally  CVS: Regular rhythm, regular rate, no murmur  GI/Abd soft, nontender, nondistended,  bowel sound present CNS: Alert, awake, confused, knows he is in the hospital Psychiatry: Mood appropriate Extremities: No pedal edema, no calf tenderness  Data Review: I have personally reviewed the laboratory data and studies available.  Recent Labs  Lab 03/15/20 0137  WBC 7.3  NEUTROABS 5.5  HGB 9.1*  HCT 28.3*  MCV 94.0  PLT 200   Recent Labs  Lab 03/09/20 0137 03/09/20 0137 03/10/20 0211 03/11/20 0447 03/12/20 0455 03/13/20 0123 03/14/20 0450 03/15/20 0137  NA 131*   < > 132* 134* 134* 135  --  137  K 4.9   < > 4.7 3.8 3.7 3.7  --  3.4*  CL 100   < > 99 102 100 101  --  103  CO2 20*   < > 22 22 23 22   --  24  GLUCOSE 111*   < > 79 87 96 115*  --  110*  BUN 33*   < > 41* 37* 38* 38*  --  38*  CREATININE 1.34*   < > 1.56* 1.33* 1.50* 1.48*  --  1.40*  CALCIUM 9.1   < > 9.3 9.1 9.3 9.1  --  8.9  MG 1.7  --  1.6* 2.2 1.8 1.4*  --   --   PHOS 3.6   < > 3.4 3.7 3.6 3.4 3.2  --    < > = values in this interval not displayed.    F/u labs ordered  Signed, Terrilee Croak, MD [facility] 03/15/2020

## 2020-03-16 DIAGNOSIS — G030 Nonpyogenic meningitis: Secondary | ICD-10-CM | POA: Diagnosis not present

## 2020-03-16 DIAGNOSIS — I639 Cerebral infarction, unspecified: Secondary | ICD-10-CM | POA: Diagnosis present

## 2020-03-16 DIAGNOSIS — I6381 Other cerebral infarction due to occlusion or stenosis of small artery: Secondary | ICD-10-CM | POA: Diagnosis present

## 2020-03-16 LAB — GLUCOSE, CAPILLARY
Glucose-Capillary: 110 mg/dL — ABNORMAL HIGH (ref 70–99)
Glucose-Capillary: 87 mg/dL (ref 70–99)
Glucose-Capillary: 134 mg/dL — ABNORMAL HIGH (ref 70–99)
Glucose-Capillary: 125 mg/dL — ABNORMAL HIGH (ref 70–99)
Glucose-Capillary: 155 mg/dL — ABNORMAL HIGH (ref 70–99)

## 2020-03-16 MED ORDER — ENSURE ENLIVE PO LIQD: 237.0000 mL | Freq: Two times a day (BID) | ORAL | 12 refills | Status: AC

## 2020-03-16 MED ORDER — CYANOCOBALAMIN 1000 MCG PO TABS: 1000.0000 ug | ORAL_TABLET | Freq: Every day | ORAL | Status: AC

## 2020-03-16 MED ORDER — LEVETIRACETAM 1000 MG PO TABS: 1000.0000 mg | ORAL_TABLET | Freq: Two times a day (BID) | ORAL | Status: AC

## 2020-03-16 MED ORDER — LEVALBUTEROL HCL 0.63 MG/3ML IN NEBU: 0.6300 mg | INHALATION_SOLUTION | Freq: Four times a day (QID) | RESPIRATORY_TRACT | 12 refills | Status: AC | PRN

## 2020-03-16 NOTE — Discharge Instructions (Signed)
Seizure, Adult °A seizure is a sudden burst of abnormal electrical activity in the brain. Seizures usually last from 30 seconds to 2 minutes. They can cause many different symptoms. °Usually, seizures are not harmful unless they last a long time. °What are the causes? °Common causes of this condition include: °· Fever or infection. °· Conditions that affect the brain, such as: °? A brain abnormality that you were born with. °? A brain or head injury. °? Bleeding in the brain. °? A tumor. °? Stroke. °? Brain disorders such as autism or cerebral palsy. °· Low blood sugar. °· Conditions that are passed from parent to child (are inherited). °· Problems with substances, such as: °? Having a reaction to a drug or a medicine. °? Suddenly stopping the use of a substance (withdrawal). °In some cases, the cause may not be known. A person who has repeated seizures over time without a clear cause has a condition called epilepsy. °What increases the risk? °You are more likely to get this condition if you have: °· A family history of epilepsy. °· Had a seizure in the past. °· A brain disorder. °· A history of head injury, lack of oxygen at birth, or strokes. °What are the signs or symptoms? °There are many types of seizures. The symptoms vary depending on the type of seizure you have. Examples of symptoms during a seizure include: °· Shaking (convulsions). °· Stiffness in the body. °· Passing out (losing consciousness). °· Head nodding. °· Staring. °· Not responding to sound or touch. °· Loss of bladder control and bowel control. °Some people have symptoms right before and right after a seizure happens. °Symptoms before a seizure may include: °· Fear. °· Worry (anxiety). °· Feeling like you may vomit (nauseous). °· Feeling like the room is spinning (vertigo). °· Feeling like you saw or heard something before (déjà vu). °· Odd tastes or smells. °· Changes in how you see. You may see flashing lights or spots. °Symptoms after a  seizure happens can include: °· Confusion. °· Sleepiness. °· Headache. °· Weakness on one side of the body. °How is this treated? °Most seizures will stop on their own in under 5 minutes. In these cases, no treatment is needed. Seizures that last longer than 5 minutes will usually need treatment. Treatment can include: °· Medicines given through an IV tube. °· Avoiding things that are known to cause your seizures. These can include medicines that you take for another condition. °· Medicines to treat epilepsy. °· Surgery to stop the seizures. This may be needed if medicines do not help. °Follow these instructions at home: °Medicines °· Take over-the-counter and prescription medicines only as told by your doctor. °· Do not eat or drink anything that may keep your medicine from working, such as alcohol. °Activity °· Do not do any activities that would be dangerous if you had another seizure, like driving or swimming. Wait until your doctor says it is safe for you to do them. °· If you live in the [place] ask your local DMV (department of motor vehicles) when you can drive. °· Get plenty of rest. °Teaching others °Teach friends and family what to do when you have a seizure. They should: °· Lay you on the ground. °· Protect your head and body. °· Loosen any tight clothing around your neck. °· Turn you on your side. °· Not hold you down. °· Not put anything into your mouth. °· Know whether or not you need emergency care. °· Stay   with you until you are better. ° °General instructions °· Contact your doctor each time you have a seizure. °· Avoid anything that gives you seizures. °· Keep a seizure diary. Write down: °? What you think caused each seizure. °? What you remember about each seizure. °· Keep all follow-up visits as told by your doctor. This is important. °Contact a doctor if: °· You have another seizure. °· You have seizures more often. °· There is any change in what happens during your seizures. °· You keep having  seizures with treatment. °· You have symptoms of being sick or having an infection. °Get help right away if: °· You have a seizure that: °? Lasts longer than 5 minutes. °? Is different than seizures you had before. °? Makes it harder to breathe. °? Happens after you hurt your head. °· You have any of these symptoms after a seizure: °? Not being able to speak. °? Not being able to use a part of your body. °? Confusion. °? A bad headache. °· You have two or more seizures in a row. °· You do not wake up right after a seizure. °· You get hurt during a seizure. °These symptoms may be an emergency. Do not wait to see if the symptoms will go away. Get medical help right away. Call your local emergency services (911 in the [place] Do not drive yourself to the hospital. °Summary °· Seizures usually last from 30 seconds to 2 minutes. Usually, they are not harmful unless they last a long time. °· Do not eat or drink anything that may keep your medicine from working, such as alcohol. °· Teach friends and family what to do when you have a seizure. °· Contact your doctor each time you have a seizure. °This information is not intended to replace advice given to you by your health care provider. Make sure you discuss any questions you have with your health care provider. °Document Revised: 07/14/2018 Document Reviewed: 07/14/2018 °[employer] Patient Education © 2020 [employer] ° °

## 2020-03-16 NOTE — Discharge Summary (Signed)
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 Patient was brought to the ED on 10/6 with multiple tonic-clonic seizures including 3 in the ED. Complicated by status epilepticus that required intubation and loading with Keppra.  CT of the head was negative.  In further imaging, MRI brain showed a signal abnormality in the left limb most consistent with an acute infarct.   Patient was admitted to ICU.  Extubated on 10/8. Mental status gradually improved but not back to baseline.  Subsequently transferred to hospitalist floor. PT OT, ST evaluation were obtained.  SNF transfer recommended.   Patient and his family are from [place].  Patient was only visiting here. Family has decided to drive him by themselves to SNF at [place].  Subjective:  Seen and examined this morning.  Pleasant elderly Caucasian male.  Propped up in bed.  Not in distress.   Hospital Course:  Aseptic meningitis -10/7, patient underwent lumbar puncture. CSF analysis was consistent with aseptic meningitis with high white blood cells and very low glucose level.  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  -Because of persistent impaired mental  status, patient required large-volume LP on 10/15.   -Negative infectious disease work-up so far.  Candida was positive in the CSF sample but per infectious disease, it is most likely not the cause of infection.  Patient was empirically treated with fluconazole for possible fungal infection. -Seen by neurology and infectious disease -Completed 7-day tapering course of prednisone on 11/4   Acute ischemic infarct, left thalamic infarct -MRI brain from 10/7 showed acute infarct of the left thalamus.CTA head and neck did not show any acute large vessel occlusion.  -Currently on aspirin 81 mg daily, Plavix 75 mg daily and Lipitor 80 mg daily.   New onset seizure -Brought in for multiple tonic-clonic seizures.  Was not a status epilepticus at presentation.   -Neurology consultation appreciated.  Started on Keppra 1000 mg twice daily  Paroxysmal SVTs - While in the hospital, patient had episodes of PSVT's.   -On chart review, it was noted that patient was taking metoprolol succinate 100 mg daily at home for blood pressure control.  Patient and family denied any history of arrhythmia in the past.   -PSVT is improved after medicines were adjusted to metoprolol tartrate 75 mg twice daily.  Blood pressure stable.   Essential hypertension -Controlled on metoprolol only.  5 mm pulmonary nodule, right upper lobe -Follow-up outpatient with PCP  Right eye hematoma -Previously seen by ENT, no need for further intervention at this time. Medical management. -Sutures removed.  Moderate to severe protein caloric malnutrition -Encourage p.o. intake  Normocytic Anemia Hx of Iron Deficiency Anemia B12 Deficiency  -Continue B12 supplements -Hemoglobin stable.  Questionable Crohn's Disease s/p Ileocolic Resection -No longer on immunosuppressive therapy. -  Takes budesonide as an outpatient - -currently on hold since hospitalization for last more than 3 weeks.  He has not had any flareup of  Crohn's disease so far.  I would keep it on hold for now and recommend follow-up as an outpatient.  AKI on CKD stage II, stable -Stable Recent Labs    03/02/20 0252 03/03/20 0443 03/06/20 0546 03/07/20 0231 03/09/20 0137 03/10/20 0211 03/11/20 0447 03/12/20 0455 03/13/20 0123 03/15/20 0137  BUN 28* 28* 38* 32* 33* 41* 37* 38* 38* 38*  CREATININE 1.44* 1.46* 1.75* 1.36* 1.34* 1.56* 1.33* 1.50* 1.48* 1.40*   Wound care: Wound / Incision (Open or Dehisced) 02/14/20 Skin tear Arm Left (Active)  Date First Assessed/Time First Assessed: 02/14/20 0252   Wound Type: Skin tear  Location: Arm  Location Orientation: Left  Present on Admission: (c)     Assessments 02/14/2020  2:52 AM 03/14/2020  8:00 AM  Dressing Type Foam - Lift dressing to assess site every shift None  Dressing Changed -- Other (Comment)  Dressing Status Clean;Dry;Intact --  Site / Wound Assessment -- Clean;Dry;Pink  Peri-wound Assessment Intact Intact  Drainage Amount None --     No Linked orders to display     Incision (Closed)  Face Right;Upper (Active)  Date First Assessed/Time First Assessed: (c) (c)   Location: (c) Face  Location Orientation: Right;Upper  Present on Admission: No    Assessments 03/06/2020  8:00 AM 03/15/2020  8:00 PM  Dressing Type None None  Closure Sutures Approximated  Drainage Amount None None  Drainage Description No odor --  Treatment Cleansed Other (Comment)     No Linked orders to display    Discharge Exam:   Vitals:   03/15/20 2357 03/16/20 0435 03/16/20 0500 03/16/20 0751  BP: 110/64 127/68  114/69  Pulse: 65 64  69  Resp: 19 18  20   Temp: 98.6 F (37 C) 98.5 F (36.9 C)  97.6 F (36.4 C)  TempSrc: Oral Oral    SpO2: 100% 100%  100%  Weight:   59.4 kg   Height:        Body mass index is 18.79 kg/m.  General exam: Appears calm and comfortable.  Not in physical distress Skin: No rashes, lesions or ulcers. HEENT: Atraumatic, normocephalic, supple neck, no obvious  bleeding Lungs: Clear to auscultation bilaterally CVS: Regular rate and rhythm, no murmur GI/Abd soft, nontender, nondistended, bowel sound present CNS: Alert, awake, oriented to place and person.  Stable mental status and last several days Psychiatry: Depressed look Extremities: No pedal edema, no calf tenderness  Follow ups:   Discharge Instructions    Diet - low sodium heart healthy   Complete by: As directed    Increase activity slowly   Complete by: As directed    No wound care   Complete by: As directed       Follow-up Information    [facility].   Contact information: [address] [place] [address] [phone]              Recommendations for Outpatient Follow-Up:   1. Follow-up with PCP as an outpatient 2. Follow-up with neurologist as an outpatient  Discharge Instructions:  Follow with Primary MD System, Provider Not In in 7 days   Get CBC/BMP checked in next visit within 1 week by PCP or SNF MD ( we routinely change or add medications that can affect your baseline labs and fluid status,  therefore we recommend that you get the mentioned basic workup next visit with your PCP, your PCP may decide not to get them or add new tests based on their clinical decision)  On your next visit with your PCP, please Get Medicines reviewed and adjusted.  Please request your PCP  to go over all Hospital Tests and Procedure/Radiological results at the follow up, please get all Hospital records sent to your Prim MD by signing hospital release before you go home.  Activity: As tolerated with Full fall precautions use walker/cane & assistance as needed  For Heart failure patients - Check your Weight same time everyday, if you gain over 2 pounds, or you develop in leg swelling, experience more shortness of breath or chest pain, call your Primary MD immediately. Follow Cardiac Low Salt Diet and 1.5 lit/day fluid  restriction.  If you have smoked or chewed Tobacco in the last 2 yrs please stop smoking, stop any regular Alcohol  and or any Recreational drug use.  If you experience worsening of your admission symptoms, develop shortness of breath, life threatening emergency, suicidal or homicidal thoughts you must seek medical attention immediately by calling 911 or calling your MD immediately  if symptoms less severe.  You Must read complete instructions/literature along with all the possible adverse reactions/side effects for all the Medicines you take and that have been prescribed to you. Take any new Medicines after you have completely understood and accpet all the possible adverse reactions/side effects.   Do not drive, operate heavy machinery, perform activities at heights, swimming or participation in water activities or provide baby sitting services if your were admitted for syncope or siezures until you have seen by Primary MD or a Neurologist and advised to do so again.  Do not drive when taking Pain medications.  Do not take more than prescribed Pain, Sleep and Anxiety Medications  Wear Seat belts while driving.   Please note You were cared for by a hospitalist during your hospital stay. If you have any questions about your discharge medications or the care you received while you were in the hospital after you are discharged, you can call the unit and asked to speak with the hospitalist on call if the hospitalist that took care of you is not available. Once you are discharged, your primary care physician will handle any further medical issues. Please note that NO REFILLS for any discharge medications will be authorized once you are discharged, as it is imperative that you return to your primary care physician (or establish a relationship with a primary care physician if you do not have one) for your aftercare needs so that they can reassess your need for medications and monitor your lab  values.    Allergies as of 03/16/2020      Reactions   Lisinopril Anaphylaxis, Cough, Other (See Comments)   Other reaction(s): anaphylaxis Pt states it made him "cough his head off"   Lorazepam    Other reaction(s): Hallucinations      Medication List    STOP taking these medications   amLODipine 2.5 MG tablet Commonly known as: NORVASC   budesonide 3 MG 24 hr capsule Commonly known as: ENTOCORT EC   cyproheptadine 4 MG tablet Commonly known as: PERIACTIN   doxazosin 1 MG tablet Commonly known as: CARDURA   hydrochlorothiazide 25 MG tablet Commonly known as: HYDRODIURIL   meclizine 25 MG tablet Commonly known as: ANTIVERT   metoprolol succinate 100 MG 24 hr tablet Commonly known  as: TOPROL-XL     TAKE these medications   albuterol 108 (90 Base) MCG/ACT inhaler Commonly known as: VENTOLIN HFA Inhale 2 puffs into the lungs every 6 (six) hours as needed for wheezing or shortness of breath.   ALPRAZolam 0.25 MG tablet Commonly known as: XANAX Take 1 tablet (0.25 mg total) by mouth 3 (three) times daily as needed for up to 5 days for anxiety.   aspirin 81 MG chewable tablet Chew 1 tablet (81 mg total) by mouth daily.   atorvastatin 80 MG tablet Commonly known as: LIPITOR Take 1 tablet (80 mg total) by mouth daily. What changed:   medication strength  how much to take   busPIRone 15 MG tablet Commonly known as: BUSPAR Take 1 tablet (15 mg total) by mouth 2 (two) times daily.   cyanocobalamin 1000 MCG tablet Take 1 tablet (1,000 mcg total) by mouth daily. Start taking on: March 17, 2020   feeding supplement Liqd Take 237 mLs by mouth 2 (two) times daily between meals.   fenofibrate 160 MG tablet Take 160 mg by mouth daily.   levalbuterol 0.63 MG/3ML nebulizer solution Commonly known as: XOPENEX Take 3 mLs (0.63 mg total) by nebulization every 6 (six) hours as needed for wheezing or shortness of breath.   levETIRAcetam 1000 MG tablet Commonly  known as: KEPPRA Take 1 tablet (1,000 mg total) by mouth every 12 (twelve) hours.   metoprolol tartrate 25 MG tablet Commonly known as: LOPRESSOR Take 3 tablets (75 mg total) by mouth 2 (two) times daily for 10 days.   pantoprazole 20 MG tablet Commonly known as: PROTONIX Take 20 mg by mouth daily.       Time coordinating discharge: 35 minutes  The results of significant diagnostics from this hospitalization (including imaging, microbiology, ancillary and laboratory) are listed below for reference.    Procedures and Diagnostic Studies:   EEG  Result Date: 02/13/2020 Lora Havens, MD     02/13/2020  8:23 PM Patient Name: Qamar Aughenbaugh MRN: [id number] Epilepsy Attending: Lora Havens Referring Physician/Provider: Dr Verna Czech Date: 02/13/2020 Duration: 25.21 mins Patient history: 74yo M with new onset status epilepticus, EEG to evaluate for seizure. Level of alertness:  comatose AEDs during EEG study: Propofol Technical aspects: This EEG study was done with scalp electrodes positioned according to the 10-20 International system of electrode placement. Electrical activity was acquired at a sampling rate of 500Hz  and reviewed with a high frequency filter of 70Hz  and a low frequency filter of 1Hz . EEG data were recorded continuously and digitally stored. Description: EEG showed continuous generalized 3 to 6 Hz theta-delta slowing admixed with intermittent maximal frontal 15-18 Hz beta activity.  Hyperventilation and photic stimulation were not performed.   ABNORMALITY -Continuous slow, generalized IMPRESSION: This study is suggestive of severe diffuse encephalopathy, nonspecific etiology but likely related to sedation. No seizures or epileptiform discharges were seen throughout the recording. Lora Havens   CT HEAD WO CONTRAST  Result Date: 02/13/2020 CLINICAL DATA:  Mental status change. Clinical notes state seizure. Multiple recent falls seen at different hospitals over the  weekend. EXAM: CT HEAD WITHOUT CONTRAST TECHNIQUE: Contiguous axial images were obtained from the base of the skull through the vertex without intravenous contrast. COMPARISON:  None available. FINDINGS: Brain: Age related volume loss. Mild to moderate chronic small vessel ischemia. No intracranial hemorrhage, mass effect, or midline shift. No hydrocephalus. The basilar cisterns are patent. No evidence of territorial infarct or acute ischemia. No extra-axial  or intracranial fluid collection. Vascular: No hyperdense vessel or unexpected calcification. Skull: No fracture or focal lesion. Sinuses/Orbits: Paranasal sinuses and mastoid air cells are clear. The visualized orbits are unremarkable. Bilateral cataract resection. Other: None. IMPRESSION: 1. No acute intracranial abnormality. 2. Age related volume loss and chronic small vessel ischemia. Electronically Signed   By: Keith Rake M.D.   On: 02/13/2020 16:44   MR BRAIN W WO CONTRAST  Result Date: 02/14/2020 CLINICAL DATA:  Seizures.  Meningitis. EXAM: MRI HEAD WITHOUT AND WITH CONTRAST TECHNIQUE: Multiplanar, multiecho pulse sequences of the brain and surrounding structures were obtained without and with intravenous contrast. CONTRAST:  6.42mL GADAVIST GADOBUTROL 1 MMOL/ML IV SOLN COMPARISON:  Head CT 02/13/2020 FINDINGS: Brain: There is a 2.3 cm region of restricted diffusion and T2 hyperintensity/edema involving the left thalamus. Restricted diffusion and T2 hyperintensity are also noted throughout much of the left hippocampus. There is no associated abnormal enhancement, and the mesial right temporal lobe is normal in signal. There is a punctate focus of restricted diffusion in the midline of the splenium of the corpus callosum. T2 hyperintensities in the cerebral white matter bilaterally are nonspecific but compatible with mild chronic small vessel ischemic disease. Mild cerebral atrophy is within normal limits for age. No intracranial hemorrhage, mass,  midline shift, or extra-axial fluid collection is identified. Vascular: Major intracranial vascular flow voids are preserved. Skull and upper cervical spine: Unremarkable bone marrow signal para Sinuses/Orbits: Bilateral cataract extraction. Clear paranasal sinuses. Trace right and small left mastoid effusions as well as layering pharyngeal fluid in the setting of intubation. Other: None. IMPRESSION: 1. Signal abnormality in the left thalamus most consistent with an acute infarct. 2. Signal abnormality in the mesial left temporal lobe and splenium of the corpus callosum which may reflect acute infarct, sequelae of recent seizure activity, or encephalitis (including herpes encephalitis). No contralateral cerebral involvement. 3. Mild chronic small vessel ischemic disease. Electronically Signed   By: Logan Bores M.D.   On: 02/14/2020 18:42   DG Chest Portable 1 View  Result Date: 02/13/2020 CLINICAL DATA:  Central line placement. Review of electronic records demonstrates this was a femoral line. EXAM: PORTABLE CHEST 1 VIEW COMPARISON:  Radiograph earlier today. FINDINGS: No central line is visualized consistent with femoral catheter placement. Endotracheal tube tip at the thoracic inlet, the enteric tube has been advanced with the side-port now below the diaphragm. There is slight volume loss in the left hemithorax with increasing atelectasis at the left lung base. Heart is normal in size. Unchanged mediastinal contours with aortic atherosclerosis. No pneumothorax. No significant pleural effusion. IMPRESSION: 1. No central line is visualized. Electronic records demonstrates femoral line was placed, which is not included on this chest field of view. 2. Endotracheal tube tip at the thoracic inlet. Enteric tube has been advanced with the side-port now below the diaphragm. 3. Increasing left lung base atelectasis. Electronically Signed   By: Keith Rake M.D.   On: 02/13/2020 19:48   DG Chest Port 1  View  Result Date: 02/13/2020 CLINICAL DATA:  Tube placement EXAM: PORTABLE CHEST 1 VIEW COMPARISON:  None. FINDINGS: Endotracheal tube is approximately 6-7 cm from the carina. Enteric tube tip passes into the stomach with side port still within the distal esophagus. No consolidation or edema. No pleural effusion or pneumothorax. Normal heart size. IMPRESSION: Endotracheal tube approximately 6-7 cm above the carina. Enteric tube could be advanced for more optimal positioning. Electronically Signed   By: Addison Lank.D.  On: 02/13/2020 16:29     Labs:   Basic Metabolic Panel: Recent Labs  Lab 03/10/20 0211 03/10/20 0211 03/11/20 0447 03/11/20 0447 03/12/20 0455 03/12/20 0455 03/13/20 0123 03/14/20 0450 03/15/20 0137  NA 132*  --  134*  --  134*  --  135  --  137  K 4.7   < > 3.8   < > 3.7   < > 3.7  --  3.4*  CL 99  --  102  --  100  --  101  --  103  CO2 22  --  22  --  23  --  22  --  24  GLUCOSE 79  --  87  --  96  --  115*  --  110*  BUN 41*  --  37*  --  38*  --  38*  --  38*  CREATININE 1.56*  --  1.33*  --  1.50*  --  1.48*  --  1.40*  CALCIUM 9.3  --  9.1  --  9.3  --  9.1  --  8.9  MG 1.6*  --  2.2  --  1.8  --  1.4*  --   --   PHOS 3.4  --  3.7  --  3.6  --  3.4 3.2  --    < > = values in this interval not displayed.   GFR Estimated Creatinine Clearance: 40.1 mL/min (A) (by C-G formula based on SCr of 1.4 mg/dL (H)). Liver Function Tests: No results for input(s): AST, ALT, ALKPHOS, BILITOT, PROT, ALBUMIN in the last 168 hours. No results for input(s): LIPASE, AMYLASE in the last 168 hours. No results for input(s): AMMONIA in the last 168 hours. Coagulation profile No results for input(s): INR, PROTIME in the last 168 hours.  CBC: Recent Labs  Lab 03/15/20 0137  WBC 7.3  NEUTROABS 5.5  HGB 9.1*  HCT 28.3*  MCV 94.0  PLT 200   Cardiac Enzymes: No results for input(s): CKTOTAL, CKMB, CKMBINDEX, TROPONINI in the last 168 hours. BNP: Invalid input(s):  POCBNP CBG: Recent Labs  Lab 03/15/20 1652 03/15/20 2056 03/15/20 2356 03/16/20 0433 03/16/20 0801  GLUCAP 92 116* 110* 110* 87   D-Dimer No results for input(s): DDIMER in the last 72 hours. Hgb A1c No results for input(s): HGBA1C in the last 72 hours. Lipid Profile No results for input(s): CHOL, HDL, LDLCALC, TRIG, CHOLHDL, LDLDIRECT in the last 72 hours. Thyroid function studies No results for input(s): TSH, T4TOTAL, T3FREE, THYROIDAB in the last 72 hours.  Invalid input(s): FREET3 Anemia work up No results for input(s): VITAMINB12, FOLATE, FERRITIN, TIBC, IRON, RETICCTPCT in the last 72 hours. Microbiology No results found for this or any previous visit (from the past 240 hour(s)).   Signed: Marlowe Aschoff Kamilla Hands  [facility] 03/16/2020, 10:57 AM

## 2020-03-16 NOTE — Care Management (Signed)
DC summary faxed: [phone] Spoke w nurse and charge nurse to have patient ready for the AM to DC. Requested MD to place DC order per [facility] handoff.

## 2020-03-17 LAB — GLUCOSE, CAPILLARY
Glucose-Capillary: 102 mg/dL — ABNORMAL HIGH (ref 70–99)
Glucose-Capillary: 120 mg/dL — ABNORMAL HIGH (ref 70–99)
Glucose-Capillary: 94 mg/dL (ref 70–99)
Glucose-Capillary: 96 mg/dL (ref 70–99)

## 2020-03-17 LAB — PHOSPHORUS: Phosphorus: 3.2 mg/dL (ref 2.5–4.6)

## 2020-03-17 NOTE — Progress Notes (Signed)
Physical Therapy Treatment Patient Details Name: Howard Allen MRN: [id number] DOB: Dec 20, 1945 Today's Date: 03/17/2020    History of Present Illness Pt is a 74 year old male who was having multiple seizures and has a history of falls. 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 NIH 4 on 02/15/20 and then 14 on 02/17/20. Medical hx consisting of Chron's disease and HTN.    PT Comments    Pt continues to display R foot drag and narrow BOS during gait, placing him at risk for falls with mobility. Focused session on gait training, providing cues to improve R step length and foot clearance along with maintaining a wider BOS, with success noted. However, carryover was poor as once cues were removed he would return to his prior functional state. Will continue to follow acutely and recommend SNF upon d/c to maximize his independence and safety with all functional mobility through addressing his deficits.  Follow Up Recommendations  Supervision/Assistance - 24 hour;SNF     Equipment Recommendations  Rolling walker with 5" wheels;3in1 (PT);Wheelchair (measurements PT);Wheelchair cushion (measurements PT);Hospital bed    Recommendations for Other Services       Precautions / Restrictions Precautions Precautions: Fall Precaution Comments: h/o seizures Restrictions Weight Bearing Restrictions: No    Mobility  Bed Mobility Overal bed mobility: Needs Assistance Bed Mobility: Supine to Sit;Rolling Rolling: Min guard   Supine to sit: Min guard;HOB elevated     General bed mobility comments: Extra time and cues to sequence  transition supine > sit, with pt utilizing bed rails for all bed mob.   Transfers Overall transfer level: Needs assistance Equipment used: Rolling walker (2 wheeled) Transfers: Sit to/from Stand Sit to Stand: Min assist         General transfer comment: Repeated VCs and TCs on proper hand placement on bed rather than RW to attempt STS transfer, cuing pt to scoot anteriorly and rock anteriorly to gain momentum to come to stand. Min A for safety as pt disp;ays unsteadiness and requires extra time to power up to stand.  Ambulation/Gait Ambulation/Gait assistance: Min assist Gait Distance (Feet): 32 Feet Assistive device: Rolling walker (2 wheeled) Gait Pattern/deviations: Step-to pattern;Decreased step length - right;Shuffle;Narrow base of support Gait velocity: reduced Gait velocity interpretation: <1.8 ft/sec, indicate of risk for recurrent falls General Gait Details: R foot drag noted, with B decreased step length (R>L) and narrow BOS with B feet touching majority of time. Placed therapist's foot between pt's feet to maintain wider BOS, with success but min carryover once cues removed. VCs and TCs at R hamstring duirng swing to facilitate inc step length and foot clearance. Unsteadiness but no overt LOB noted.   Stairs             Wheelchair Mobility    Modified Rankin (Stroke Patients Only) Modified Rankin (Stroke Patients Only) Pre-Morbid Rankin Score: No symptoms Modified Rankin: Moderately severe disability     Balance Overall balance assessment: Needs assistance Sitting-balance support: Feet supported;Bilateral upper extremity supported Sitting balance-Leahy Scale: Good Sitting balance - Comments: Supervision for safety, but no trunk sway or LOB with static sitting EOB.   Standing balance  support: Bilateral upper extremity supported;During functional activity Standing balance-Leahy Scale: Poor Standing balance comment: Trunk sway noted, B UE support on RW and  minA-min guard assist for standing balance.                            Cognition Arousal/Alertness: Awake/alert Behavior During Therapy: Flat affect Overall Cognitive Status: Impaired/Different from baseline Area of Impairment: Memory;Following commands;Safety/judgement;Problem solving;Awareness;Orientation                 Orientation Level: Disoriented to;Place;Time (states was in church and month October and year 2000)   Memory: Decreased short-term memory Following Commands: Follows one step commands consistently;Follows one step commands with increased time Safety/Judgement: Decreased awareness of safety;Decreased awareness of deficits Awareness: Emergent Problem Solving: Slow processing;Difficulty sequencing;Requires verbal cues;Requires tactile cues General Comments: Flat affect throughout session, with pt appearing to be disappointed when corrected on current date and location. Required repeated cues and extra time to sequence tasks and to maintain safety.       Exercises      General Comments        Pertinent Vitals/Pain Pain Assessment: 0-10 Pain Score: 3  Pain Location: private parts Pain Descriptors / Indicators: Grimacing;Discomfort Pain Intervention(s): Limited activity within patient's tolerance;Monitored during session    Home Living                      Prior Function            PT Goals (current goals can now be found in the care plan section) Acute Rehab PT Goals Patient Stated Goal: to go home PT Goal Formulation: With patient/family Time For Goal Achievement: 03/18/20 Potential to Achieve Goals: Good Progress towards PT goals: Progressing toward goals    Frequency    Min 3X/week      PT Plan Current plan remains appropriate    Co-evaluation              AM-PAC PT "6 Clicks" Mobility   Outcome Measure  Help needed turning from your back to your side while in a flat bed without using bedrails?: None Help  needed moving from lying on your back to sitting on the side of a flat bed without using bedrails?: None Help needed moving to and from a bed to a chair (including a wheelchair)?: A Little Help needed standing up from a chair using your arms (e.g., wheelchair or bedside chair)?: A Little Help needed to walk in hospital room?: A Little Help needed climbing 3-5 steps with a railing? : Total 6 Click Score: 18    End of Session Equipment Utilized During Treatment: Gait belt Activity Tolerance: Patient tolerated treatment well Patient left: in chair;with nursing/sitter in room;with family/visitor present (daughter present; in wheelchair) Nurse Communication: Mobility status PT Visit Diagnosis: Unsteadiness on feet (R26.81);Other abnormalities of gait and mobility (R26.89);Muscle weakness (generalized) (M62.81);History of falling (Z91.81);Difficulty in walking, not elsewhere classified (R26.2);Other symptoms and signs involving the nervous system (B34.193)     Time: 7902-4097 PT Time Calculation (min) (ACUTE ONLY): 12 min  Charges:  $Gait Training: 8-22 mins                     Moishe Spice, PT, DPT Acute Rehabilitation Services  Pager: [phone] Office: [place] 03/17/2020, 8:26 AM

## 2020-03-17 NOTE — Progress Notes (Signed)
Called report to [facility] in [place]. Spoke to [employer], no questions at this time.

## 2020-03-20 LAB — ARBOVIRUS PANEL, ~~LOC~~ LAB

## 2020-03-26 LAB — CULTURE, FUNGUS WITHOUT SMEAR

## 2020-04-01 LAB — CULTURE, FUNGUS WITHOUT SMEAR

## 2020-04-04 LAB — ACID FAST CULTURE WITH REFLEXED SENSITIVITIES (MYCOBACTERIA): Acid Fast Culture: NEGATIVE

## 2020-04-10 LAB — ACID FAST CULTURE WITH REFLEXED SENSITIVITIES (MYCOBACTERIA): Acid Fast Culture: NEGATIVE

## 2020-06-10 DEATH — deceased

## 2022-03-31 IMAGING — MR MR HEAD WO/W CM
9 of 15 series · 31 of 48 positions shown · IV contrast (Yes   MULTIHANCE)
Comparison: Head CT 02/13/2020

CLINICAL DATA: Seizures.  Meningitis.

EXAM:
MRI HEAD WITHOUT AND WITH CONTRAST
TECHNIQUE: Multiplanar, multiecho pulse sequences of the brain and surrounding
structures were obtained without and with intravenous contrast.
CONTRAST:  6.5mL GADAVIST GADOBUTROL 1 MMOL/ML IV SOLN

[Series 3: DWI · axial · 3.0mm · 1.09mm/px · z∈[-100,+53]mm · 7 of 108 slices shown (1 of 4)]
[im 1/108]
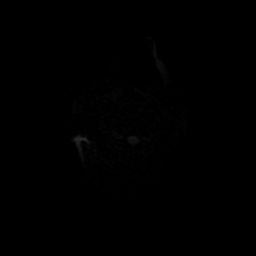
[im 18/108]
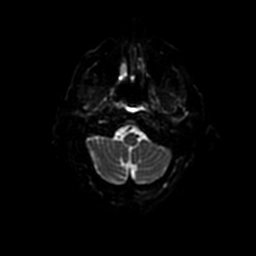
[im 36/108]
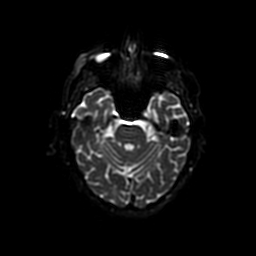
[im 54/108]
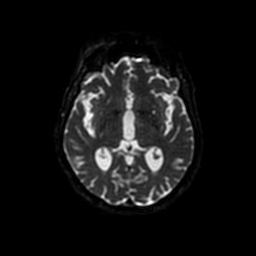
[im 72/108]
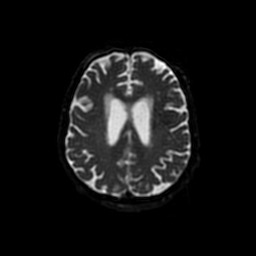
[im 90/108]
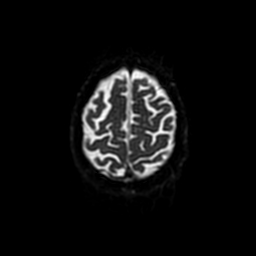
[im 108/108]
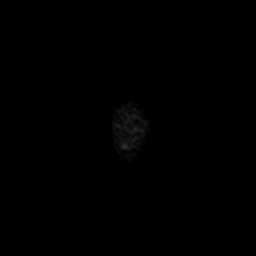

[Series 4: DWI · coronal · 5.0mm · 1.09mm/px · 4 of 76 slices shown (2 of 4)]
[im 1/76]
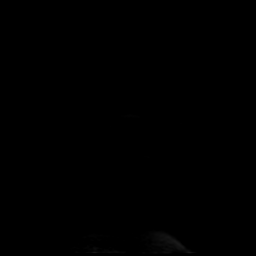
[im 26/76]
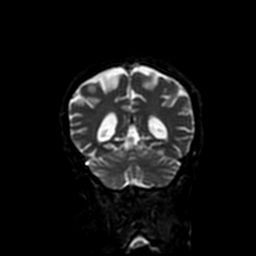
[im 51/76]
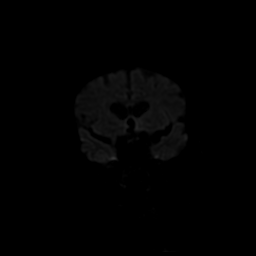
[im 76/76]
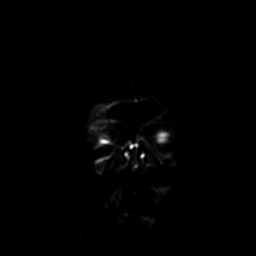

[Series 7: FLAIR · axial · 3.0mm · 0.43mm/px · z∈[-105,+40]mm · 2 of 26 slices shown (1 of 2)]
[im 1/26]
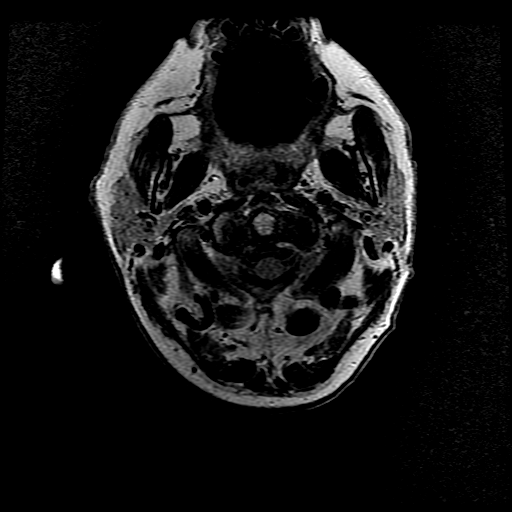
[im 26/26]
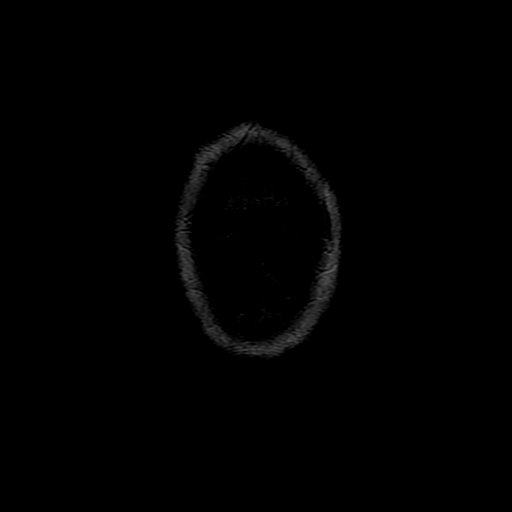

[Series 11: FLAIR · coronal · 3.0mm · 0.39mm/px · 3 of 40 slices shown (2 of 2)]
[im 1/40]
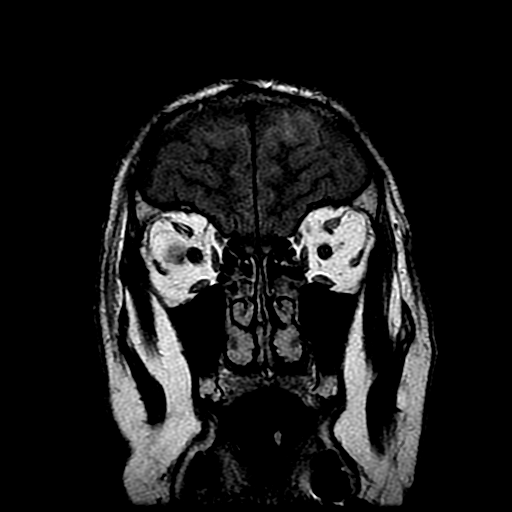
[im 20/40]
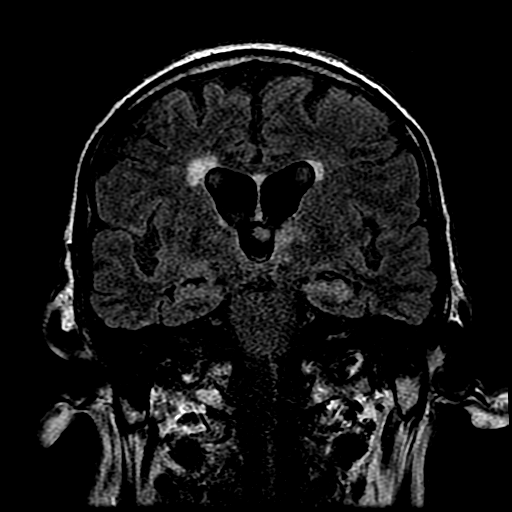
[im 40/40]
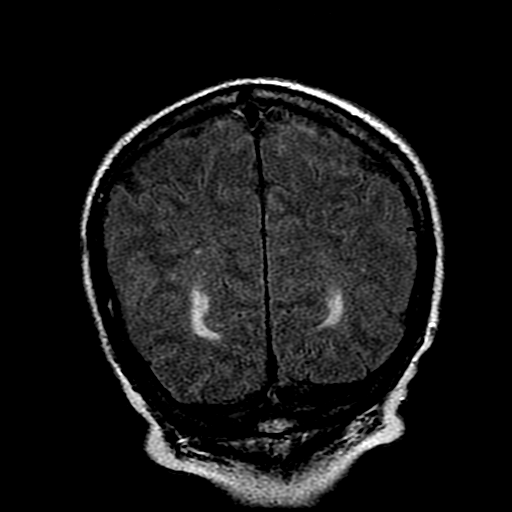

[Series 13: T1 post-contrast · axial · 3.0mm · 0.47mm/px · z∈[-106,+42]mm · 4 of 52 slices shown (1 of 3)]
[im 1/52]
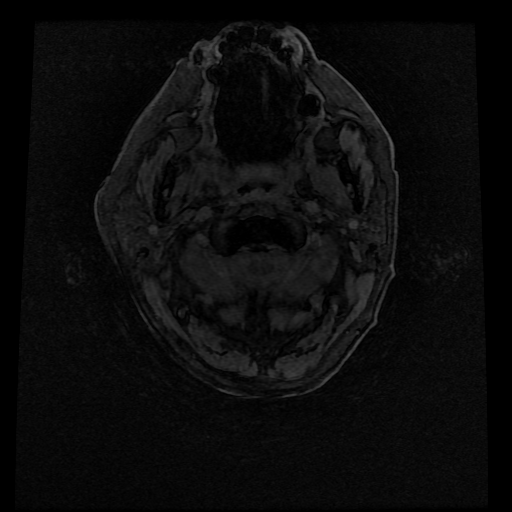
[im 18/52]
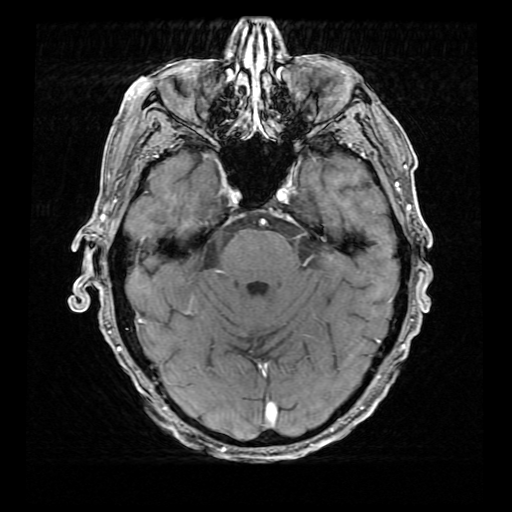
[im 35/52]
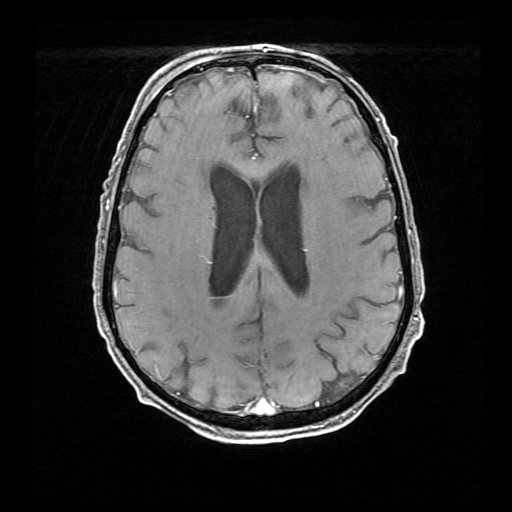
[im 52/52]
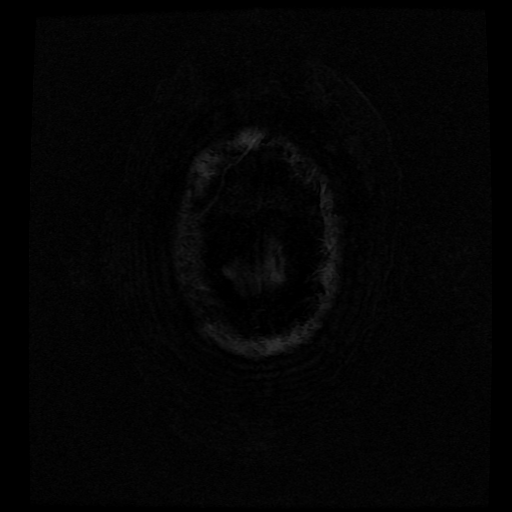

[Series 14: T1 post-contrast · coronal · 5.0mm · 0.39mm/px · 2 of 29 slices shown (2 of 3)]
[im 1/29]
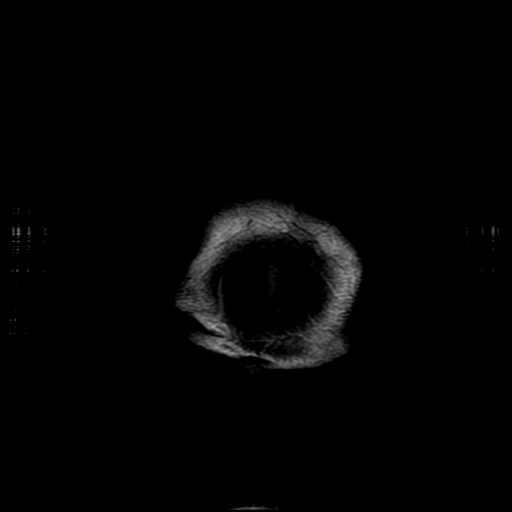
[im 29/29]
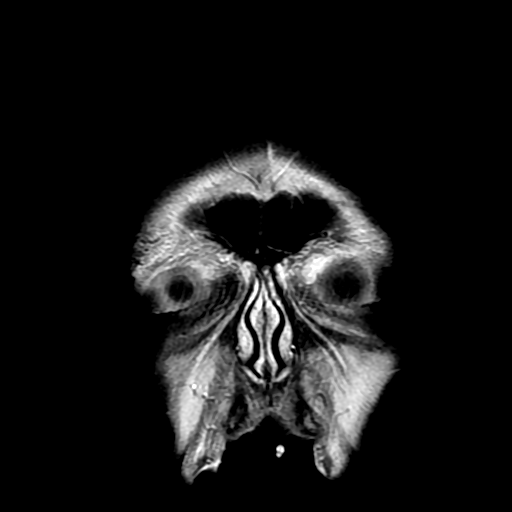

[Series 15: T1 post-contrast · sagittal · 5.0mm · 0.47mm/px · 2 of 25 slices shown (3 of 3)]
[im 1/25]
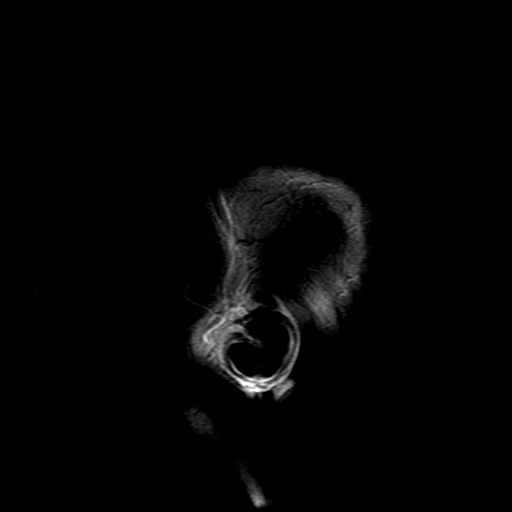
[im 25/25]
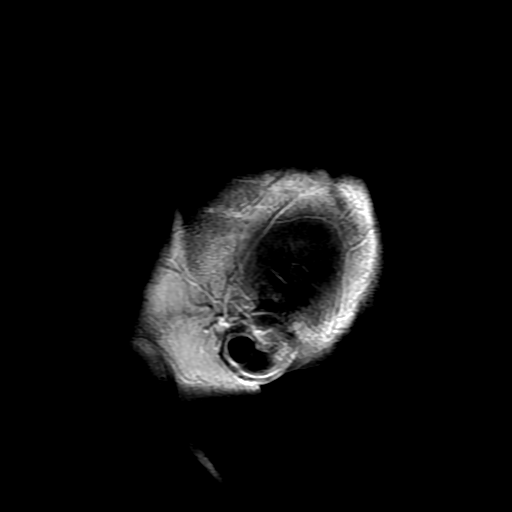

[Series 300: DWI · axial · 3.0mm · 1.09mm/px · z∈[-100,+53]mm · 4 of 54 slices shown (3 of 4)]
[im 1/54]
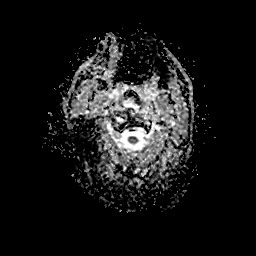
[im 18/54]
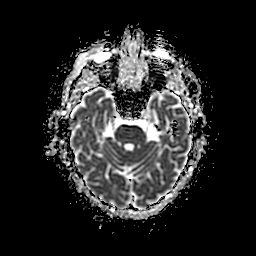
[im 36/54]
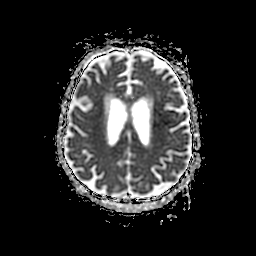
[im 54/54]
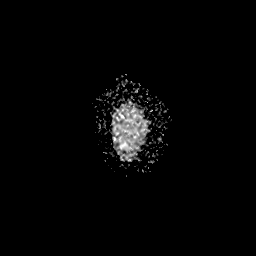

[Series 400: DWI · coronal · 5.0mm · 1.09mm/px · 3 of 38 slices shown (4 of 4)]
[im 1/38]
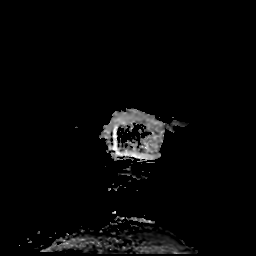
[im 19/38]
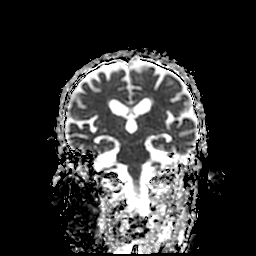
[im 38/38]
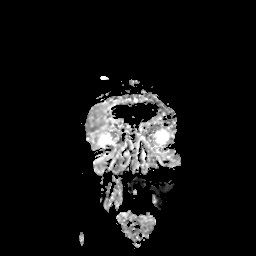

[31 of 48 positions shown; findings below may reference images not displayed]

FINDINGS: Brain: There is a 2.3 cm region of restricted diffusion and T2
hyperintensity/edema involving the left thalamus. Restricted
diffusion and T2 hyperintensity are also noted throughout much of
the left hippocampus. There is no associated abnormal enhancement,
and the mesial right temporal lobe is normal in signal. There is a
punctate focus of restricted diffusion in the midline of the
splenium of the corpus callosum. T2 hyperintensities in the cerebral
white matter bilaterally are nonspecific but compatible with mild
chronic small vessel ischemic disease. Mild cerebral atrophy is
within normal limits for age. No intracranial hemorrhage, mass,
midline shift, or extra-axial fluid collection is identified.

Vascular: Major intracranial vascular flow voids are preserved.

Skull and upper cervical spine: Unremarkable bone marrow signal para

Sinuses/Orbits: Bilateral cataract extraction. Clear paranasal
sinuses. Trace right and small left mastoid effusions as well as
layering pharyngeal fluid in the setting of intubation.

Other: None.
IMPRESSION: 1. Signal abnormality in the left thalamus most consistent with an
acute infarct.
2. Signal abnormality in the mesial left temporal lobe and splenium
of the corpus callosum which may reflect acute infarct, sequelae of
recent seizure activity, or encephalitis (including herpes
encephalitis). No contralateral cerebral involvement.
3. Mild chronic small vessel ischemic disease.
